# Patient Record
Sex: Male | Born: 1940 | Race: Black or African American | Hispanic: No | Marital: Married | Smoking: Former smoker
Health system: Southern US, Community
[De-identification: ages and names within clinical notes are randomized; demographics above are authoritative.]

## PROBLEM LIST (undated history)

## (undated) DIAGNOSIS — I10 Essential (primary) hypertension: Secondary | ICD-10-CM

## (undated) DIAGNOSIS — E119 Type 2 diabetes mellitus without complications: Secondary | ICD-10-CM

## (undated) DIAGNOSIS — F988 Other specified behavioral and emotional disorders with onset usually occurring in childhood and adolescence: Secondary | ICD-10-CM

## (undated) DIAGNOSIS — E785 Hyperlipidemia, unspecified: Secondary | ICD-10-CM

## (undated) DIAGNOSIS — H5440 Blindness, one eye, unspecified eye: Secondary | ICD-10-CM

## (undated) DIAGNOSIS — M1990 Unspecified osteoarthritis, unspecified site: Secondary | ICD-10-CM

## (undated) HISTORY — DX: Hyperlipidemia, unspecified: E78.5

## (undated) HISTORY — DX: Other specified behavioral and emotional disorders with onset usually occurring in childhood and adolescence: F98.8

## (undated) HISTORY — PX: EYE SURGERY: SHX253

## (undated) HISTORY — DX: Essential (primary) hypertension: I10

## (undated) HISTORY — PX: OTHER SURGICAL HISTORY: SHX169

## (undated) HISTORY — PX: TONSILLECTOMY: SUR1361

## (undated) HISTORY — DX: Type 2 diabetes mellitus without complications: E11.9

## (undated) HISTORY — DX: Blindness, one eye, unspecified eye: H54.40

---

## 2001-03-05 ENCOUNTER — Encounter: Payer: Self-pay | Admitting: Surgery

## 2001-03-10 ENCOUNTER — Ambulatory Visit (HOSPITAL_COMMUNITY): Admission: RE | Admit: 2001-03-10 | Discharge: 2001-03-10 | Payer: Self-pay | Admitting: Surgery

## 2003-02-01 ENCOUNTER — Encounter: Admission: RE | Admit: 2003-02-01 | Discharge: 2003-05-02 | Payer: Self-pay | Admitting: Family Medicine

## 2003-07-13 ENCOUNTER — Encounter (HOSPITAL_BASED_OUTPATIENT_CLINIC_OR_DEPARTMENT_OTHER): Admission: RE | Admit: 2003-07-13 | Discharge: 2003-10-11 | Payer: Self-pay | Admitting: Internal Medicine

## 2003-10-27 ENCOUNTER — Encounter (HOSPITAL_BASED_OUTPATIENT_CLINIC_OR_DEPARTMENT_OTHER): Admission: RE | Admit: 2003-10-27 | Discharge: 2003-11-25 | Payer: Self-pay | Admitting: Internal Medicine

## 2004-02-23 ENCOUNTER — Encounter (HOSPITAL_BASED_OUTPATIENT_CLINIC_OR_DEPARTMENT_OTHER): Admission: RE | Admit: 2004-02-23 | Discharge: 2004-03-01 | Payer: Self-pay | Admitting: Internal Medicine

## 2004-05-30 ENCOUNTER — Encounter (HOSPITAL_BASED_OUTPATIENT_CLINIC_OR_DEPARTMENT_OTHER): Admission: RE | Admit: 2004-05-30 | Discharge: 2004-06-08 | Payer: Self-pay | Admitting: Internal Medicine

## 2004-09-05 ENCOUNTER — Encounter (HOSPITAL_BASED_OUTPATIENT_CLINIC_OR_DEPARTMENT_OTHER): Admission: RE | Admit: 2004-09-05 | Discharge: 2004-09-28 | Payer: Self-pay | Admitting: Internal Medicine

## 2004-12-11 ENCOUNTER — Encounter (HOSPITAL_BASED_OUTPATIENT_CLINIC_OR_DEPARTMENT_OTHER): Admission: RE | Admit: 2004-12-11 | Discharge: 2004-12-24 | Payer: Self-pay | Admitting: Internal Medicine

## 2006-04-30 ENCOUNTER — Encounter: Admission: RE | Admit: 2006-04-30 | Discharge: 2006-05-23 | Payer: Self-pay | Admitting: Family Medicine

## 2012-01-07 DIAGNOSIS — H2510 Age-related nuclear cataract, unspecified eye: Secondary | ICD-10-CM | POA: Diagnosis not present

## 2012-01-07 DIAGNOSIS — E119 Type 2 diabetes mellitus without complications: Secondary | ICD-10-CM | POA: Diagnosis not present

## 2012-01-13 DIAGNOSIS — E119 Type 2 diabetes mellitus without complications: Secondary | ICD-10-CM | POA: Diagnosis not present

## 2012-01-13 DIAGNOSIS — E7800 Pure hypercholesterolemia, unspecified: Secondary | ICD-10-CM | POA: Diagnosis not present

## 2012-01-13 DIAGNOSIS — I10 Essential (primary) hypertension: Secondary | ICD-10-CM | POA: Diagnosis not present

## 2012-01-13 DIAGNOSIS — D179 Benign lipomatous neoplasm, unspecified: Secondary | ICD-10-CM | POA: Diagnosis not present

## 2012-03-30 DIAGNOSIS — H2510 Age-related nuclear cataract, unspecified eye: Secondary | ICD-10-CM | POA: Diagnosis not present

## 2012-06-01 DIAGNOSIS — H2510 Age-related nuclear cataract, unspecified eye: Secondary | ICD-10-CM | POA: Diagnosis not present

## 2012-07-13 DIAGNOSIS — E7800 Pure hypercholesterolemia, unspecified: Secondary | ICD-10-CM | POA: Diagnosis not present

## 2012-07-13 DIAGNOSIS — Z23 Encounter for immunization: Secondary | ICD-10-CM | POA: Diagnosis not present

## 2012-07-13 DIAGNOSIS — E119 Type 2 diabetes mellitus without complications: Secondary | ICD-10-CM | POA: Diagnosis not present

## 2012-07-13 DIAGNOSIS — I10 Essential (primary) hypertension: Secondary | ICD-10-CM | POA: Diagnosis not present

## 2012-09-11 DIAGNOSIS — Z1211 Encounter for screening for malignant neoplasm of colon: Secondary | ICD-10-CM | POA: Diagnosis not present

## 2012-09-11 DIAGNOSIS — K5730 Diverticulosis of large intestine without perforation or abscess without bleeding: Secondary | ICD-10-CM | POA: Diagnosis not present

## 2012-12-03 DIAGNOSIS — H2510 Age-related nuclear cataract, unspecified eye: Secondary | ICD-10-CM | POA: Diagnosis not present

## 2013-01-11 DIAGNOSIS — Z23 Encounter for immunization: Secondary | ICD-10-CM | POA: Diagnosis not present

## 2013-01-11 DIAGNOSIS — I10 Essential (primary) hypertension: Secondary | ICD-10-CM | POA: Diagnosis not present

## 2013-01-19 ENCOUNTER — Other Ambulatory Visit: Payer: Self-pay | Admitting: Family Medicine

## 2013-01-19 DIAGNOSIS — I10 Essential (primary) hypertension: Secondary | ICD-10-CM

## 2013-01-22 ENCOUNTER — Ambulatory Visit: Payer: Self-pay

## 2013-01-27 ENCOUNTER — Ambulatory Visit
Admission: RE | Admit: 2013-01-27 | Discharge: 2013-01-27 | Disposition: A | Discharge status: Home / Self Care | Payer: Medicare Other | Source: Ambulatory Visit | Attending: Family Medicine | Admitting: Family Medicine

## 2013-06-03 DIAGNOSIS — H2510 Age-related nuclear cataract, unspecified eye: Secondary | ICD-10-CM | POA: Diagnosis not present

## 2013-06-03 DIAGNOSIS — E119 Type 2 diabetes mellitus without complications: Secondary | ICD-10-CM | POA: Diagnosis not present

## 2013-07-12 DIAGNOSIS — Z23 Encounter for immunization: Secondary | ICD-10-CM | POA: Diagnosis not present

## 2013-07-12 DIAGNOSIS — I10 Essential (primary) hypertension: Secondary | ICD-10-CM | POA: Diagnosis not present

## 2013-07-12 DIAGNOSIS — E7800 Pure hypercholesterolemia, unspecified: Secondary | ICD-10-CM | POA: Diagnosis not present

## 2013-07-12 DIAGNOSIS — E119 Type 2 diabetes mellitus without complications: Secondary | ICD-10-CM | POA: Diagnosis not present

## 2013-08-05 DIAGNOSIS — M2010 Hallux valgus (acquired), unspecified foot: Secondary | ICD-10-CM | POA: Diagnosis not present

## 2013-08-05 DIAGNOSIS — E119 Type 2 diabetes mellitus without complications: Secondary | ICD-10-CM | POA: Diagnosis not present

## 2013-08-05 DIAGNOSIS — M2040 Other hammer toe(s) (acquired), unspecified foot: Secondary | ICD-10-CM | POA: Diagnosis not present

## 2013-08-05 DIAGNOSIS — L608 Other nail disorders: Secondary | ICD-10-CM | POA: Diagnosis not present

## 2013-10-08 DIAGNOSIS — L608 Other nail disorders: Secondary | ICD-10-CM | POA: Diagnosis not present

## 2013-10-08 DIAGNOSIS — E119 Type 2 diabetes mellitus without complications: Secondary | ICD-10-CM | POA: Diagnosis not present

## 2013-12-09 DIAGNOSIS — H2510 Age-related nuclear cataract, unspecified eye: Secondary | ICD-10-CM | POA: Diagnosis not present

## 2013-12-09 DIAGNOSIS — E119 Type 2 diabetes mellitus without complications: Secondary | ICD-10-CM | POA: Diagnosis not present

## 2013-12-10 DIAGNOSIS — L608 Other nail disorders: Secondary | ICD-10-CM | POA: Diagnosis not present

## 2013-12-10 DIAGNOSIS — E119 Type 2 diabetes mellitus without complications: Secondary | ICD-10-CM | POA: Diagnosis not present

## 2014-01-11 DIAGNOSIS — H5710 Ocular pain, unspecified eye: Secondary | ICD-10-CM | POA: Diagnosis not present

## 2014-01-11 DIAGNOSIS — T85398A Other mechanical complication of other ocular prosthetic devices, implants and grafts, initial encounter: Secondary | ICD-10-CM | POA: Diagnosis not present

## 2014-01-18 DIAGNOSIS — H18239 Secondary corneal edema, unspecified eye: Secondary | ICD-10-CM | POA: Diagnosis not present

## 2014-01-18 DIAGNOSIS — H2510 Age-related nuclear cataract, unspecified eye: Secondary | ICD-10-CM | POA: Diagnosis not present

## 2014-01-19 DIAGNOSIS — E7800 Pure hypercholesterolemia, unspecified: Secondary | ICD-10-CM | POA: Diagnosis not present

## 2014-01-19 DIAGNOSIS — E119 Type 2 diabetes mellitus without complications: Secondary | ICD-10-CM | POA: Diagnosis not present

## 2014-01-19 DIAGNOSIS — Z23 Encounter for immunization: Secondary | ICD-10-CM | POA: Diagnosis not present

## 2014-01-19 DIAGNOSIS — I10 Essential (primary) hypertension: Secondary | ICD-10-CM | POA: Diagnosis not present

## 2014-01-19 DIAGNOSIS — R079 Chest pain, unspecified: Secondary | ICD-10-CM | POA: Diagnosis not present

## 2014-02-11 DIAGNOSIS — E119 Type 2 diabetes mellitus without complications: Secondary | ICD-10-CM | POA: Diagnosis not present

## 2014-02-11 DIAGNOSIS — L608 Other nail disorders: Secondary | ICD-10-CM | POA: Diagnosis not present

## 2014-03-01 ENCOUNTER — Ambulatory Visit (INDEPENDENT_AMBULATORY_CARE_PROVIDER_SITE_OTHER): Payer: Medicare Other | Admitting: Interventional Cardiology

## 2014-03-01 ENCOUNTER — Encounter: Payer: Self-pay | Admitting: Interventional Cardiology

## 2014-03-01 VITALS — BP 132/74 | HR 73 | Ht 68.0 in | Wt 197.2 lb

## 2014-03-01 DIAGNOSIS — R079 Chest pain, unspecified: Secondary | ICD-10-CM

## 2014-03-01 DIAGNOSIS — E119 Type 2 diabetes mellitus without complications: Secondary | ICD-10-CM | POA: Insufficient documentation

## 2014-03-01 DIAGNOSIS — I10 Essential (primary) hypertension: Secondary | ICD-10-CM | POA: Diagnosis not present

## 2014-03-01 DIAGNOSIS — E785 Hyperlipidemia, unspecified: Secondary | ICD-10-CM | POA: Diagnosis not present

## 2014-03-01 NOTE — Patient Instructions (Signed)
Your physician has requested that you have a lexiscan myoview. For further information please visit [email or web address]. Please follow instruction sheet, as given.  Follow up will depend on results of stress test

## 2014-03-01 NOTE — Progress Notes (Signed)
Patient ID: [name], male   DOB: [birth year], 73 y.o.   MRN: [id number]    [address] [place], [place]  [address] Phone: [phone] Fax:  [phone]  Date:  03/01/2014   ID:  [name], DOB February 04, 1941, MRN [id number]  PCP:  No primary provider on file.   ASSESSMENT:  1. Chest pain, with atypical features, however one episode occurred while shoveling snow and lasted 5 minutes 2. 15 year history of diabetes mellitus 3. Hypertension 4. Hyperlipidemia  PLAN:  1. Pharmacologic nuclear myocardial perfusion study 2. Baby aspirin one per day   SUBJECTIVE: [name] is a 73 y.o. male who has a 15 year history of diabetes, history of hypertension and hyperlipidemia. There is a significant family history of premature coronary atherosclerosis with both his father 2 brothers and a sister dying of myocardial infarction. The patient is sedentary predominantly related to hip and knee arthritis to prevent much walking. He has had episodes of left chest discomfort that of poorly characterized. One episode occurred while shoveling snow that prevented him from completing the task. After 5 minutes the discomfort resolved. Since that time he has had twinges of this discomfort that lasts seconds and resolve. There no associated symptoms or radiation of the discomfort.   Wt Readings from Last 3 Encounters:  03/01/14 197 lb 3.2 oz (89.449 kg)     Past Medical History  Diagnosis Date  . Attention deficit disorder   . Diabetes mellitus without complication   . Hyperlipidemia   . Hypertension   . Blind left eye     Current Outpatient Prescriptions  Medication Sig Dispense Refill  . aspirin 81 MG tablet Take 81 mg by mouth daily.      Marland Kitchen lisinopril (PRINIVIL,ZESTRIL) 20 MG tablet Take 20 mg by mouth daily.      . metFORMIN (GLUCOPHAGE-XR) 500 MG 24 hr tablet Take 500 mg by mouth daily with breakfast.      . simvastatin (ZOCOR) 20 MG tablet Take 20 mg by mouth  daily.       No current facility-administered medications for this visit.   Past Medical History  Diagnosis Date  . Attention deficit disorder   . Diabetes mellitus without complication   . Hyperlipidemia   . Hypertension   . Blind left eye    Family History  Problem Relation Age of Onset  . Heart attack Father   . Heart attack Sister   . Heart attack Brother     Allergies:   No Known Allergies  Social History:  The patient  reports that he has quit smoking. He does not have any smokeless tobacco history on file. He reports that he does not drink alcohol or use illicit drugs.   ROS:  Please see the history of present illness.   Denies blood in stool , transient neurological symptoms, difficulty with ambulation do to hip and knee arthritis bilateral, transient neurological symptoms, syncope, palpitations, orthopnea, PND, and ankle edema.   All other systems reviewed and negative.   OBJECTIVE: VS:  BP 132/74  Pulse 73  Ht 5\' 8"  (1.727 m)  Wt 197 lb 3.2 oz (89.449 kg)  BMI 29.99 kg/m2 Well nourished, well developed, in no acute distress, elderly appearing older than stated age 77: normal Neck: JVD flat. Carotid bruit absent  Cardiac:  normal S1, S2; RRR; no murmur Lungs:  clear to auscultation bilaterally, no wheezing, rhonchi or rales Abd: soft, nontender, no hepatomegaly Ext:  Edema absent. Pulses 2+ bilateral Skin: warm and dry Neuro:  CNs 2-12 intact, no focal abnormalities noted  EKG:  Normal sinus rhythm normal EKG       Signed, Illene Labrador III, MD 03/01/2014 9:17 AM

## 2014-03-15 ENCOUNTER — Ambulatory Visit (HOSPITAL_COMMUNITY): Payer: Medicare Other | Attending: Cardiovascular Disease | Admitting: Radiology

## 2014-03-15 VITALS — BP 135/67 | Ht 68.0 in | Wt 190.0 lb

## 2014-03-15 DIAGNOSIS — R079 Chest pain, unspecified: Secondary | ICD-10-CM | POA: Diagnosis not present

## 2014-03-15 DIAGNOSIS — I10 Essential (primary) hypertension: Secondary | ICD-10-CM | POA: Insufficient documentation

## 2014-03-15 DIAGNOSIS — Z87891 Personal history of nicotine dependence: Secondary | ICD-10-CM | POA: Diagnosis not present

## 2014-03-15 DIAGNOSIS — E119 Type 2 diabetes mellitus without complications: Secondary | ICD-10-CM | POA: Diagnosis not present

## 2014-03-15 DIAGNOSIS — Z8249 Family history of ischemic heart disease and other diseases of the circulatory system: Secondary | ICD-10-CM | POA: Insufficient documentation

## 2014-03-15 MED ORDER — TECHNETIUM TC 99M SESTAMIBI GENERIC - CARDIOLITE
11.0000 | Freq: Once | INTRAVENOUS | Status: AC | PRN
  Administered 2014-03-15: 11 via INTRAVENOUS

## 2014-03-15 MED ORDER — TECHNETIUM TC 99M SESTAMIBI GENERIC - CARDIOLITE
33.0000 | Freq: Once | INTRAVENOUS | Status: AC | PRN
  Administered 2014-03-15: 33 via INTRAVENOUS

## 2014-03-15 MED ORDER — REGADENOSON 0.4 MG/5ML IV SOLN
0.4000 mg | Freq: Once | INTRAVENOUS | Status: AC
  Administered 2014-03-15: 0.4 mg via INTRAVENOUS

## 2014-03-15 NOTE — Progress Notes (Signed)
[facility] [address] [place], [place] [address] [phone]    Cardiology Nuclear Med Study  [name] is a 73 y.o. male     MRN : [id number]     DOB: 05/17/1941  Procedure Date: 03/15/2014  Nuclear Med Background Indication for Stress Test:  Evaluation for Ischemia History:  n/a Cardiac Risk Factors:Strong  Family History - CAD, History of Smoking, Hypertension, Lipids and NIDDM  Symptoms:  Chest Pain   Nuclear Pre-Procedure Caffeine/Decaff Intake:  None > 12 hrs NPO After: 6:00pm   Lungs:  clear O2 Sat: 97% on room air. IV 0.9% NS with Angio Cath:  22g  IV Site: R Hand x 1, tolerated well IV Started by:  Irven Baltimore, RN  Chest Size (in):  44 Cup Size: n/a  Height: 5\' 8"  (1.727 m)  Weight:  190 lb (86.183 kg)  BMI:  Body mass index is 28.9 kg/(m^2). Tech Comments:  Fasting CBG was 158 at 0600 today per patient. No medication (metformin) this am. Irven Baltimore, RN.    Nuclear Med Study 1 or 2 day study: 1 day  Stress Test Type:  Carlton Adam  Reading MD: N/A  Order Authorizing Provider:  Daneen Schick, III, MD  Resting Radionuclide: Technetium 55m Sestamibi  Resting Radionuclide Dose: 11.0 mCi   Stress Radionuclide:  Technetium 67m Sestamibi  Stress Radionuclide Dose: 33.0 mCi           Stress Protocol Rest HR: 75 Stress HR: 96  Rest BP: 135/67 Stress BP: 138/81  Exercise Time (min): n/a METS: n/a   Predicted Max HR: 148 bpm % Max HR: 64.86 bpm Rate Pressure Product: 13248   Dose of Adenosine (mg):  n/a Dose of Lexiscan: 0.4 mg  Dose of Atropine (mg): n/a Dose of Dobutamine: n/a mcg/kg/min (at max HR)  Stress Test Technologist: Perrin Maltese, EMT-P  Nuclear Technologist:  Charlton Amor, CNMT     Rest Procedure:  Myocardial perfusion imaging was performed at rest 45 minutes following the intravenous administration of Technetium 63m Sestamibi. Rest ECG: NSR - Normal EKG  Stress Procedure:  The patient received IV Lexiscan  0.4 mg over 15-seconds.  Technetium 4m Sestamibi injected at 30-seconds. This patient had sob and chest discomfort with the Lexiscan injection. Quantitative spect images were obtained after a 45 minute delay. Stress ECG: No significant change from baseline ECG  QPS Raw Data Images:  Mild diaphragmatic attenuation.  Normal left ventricular size. Stress Images:  There is decreased uptake in the inferior wall. Rest Images:  There is decreased uptake in the inferior wall. Subtraction (SDS):  There is a fixed inferior defect that is most consistent with diaphragmatic attenuation. Transient Ischemic Dilatation (Normal <1.22):  1.15 Lung/Heart Ratio (Normal <0.45):  0.37  Quantitative Gated Spect Images QGS EDV:  71 ml QGS ESV:  19 ml  Impression Exercise Capacity:  Lexiscan with no exercise. BP Response:  Normal blood pressure response. Clinical Symptoms:  Mild chest pain/dyspnea. ECG Impression:  No significant ECG changes with Lexiscan. Comparison with Prior Nuclear Study: No previous nuclear study performed  Overall Impression:  Low risk stress nuclear study without reversible ischemia. Mild bowel attenuation artifact inferiorly.  LV Ejection Fraction: 73%.  LV Wall Motion:  NL LV Function; NL Wall Motion  Pixie Casino, MD, [facility] Board Certified in Nuclear Cardiology Attending Cardiologist [place]

## 2014-03-16 DIAGNOSIS — M25469 Effusion, unspecified knee: Secondary | ICD-10-CM | POA: Diagnosis not present

## 2014-03-16 DIAGNOSIS — M1710 Unilateral primary osteoarthritis, unspecified knee: Secondary | ICD-10-CM | POA: Diagnosis not present

## 2014-03-16 DIAGNOSIS — M25569 Pain in unspecified knee: Secondary | ICD-10-CM | POA: Diagnosis not present

## 2014-03-17 ENCOUNTER — Telehealth: Payer: Self-pay

## 2014-03-17 NOTE — Telephone Encounter (Signed)
Message copied by Lamar Laundry on Thu Mar 17, 2014 10:21 AM ------      Message from: Daneen Schick      Created: Wed Mar 16, 2014  9:55 AM       Low risk study with normal pumping ability and no evidence of blockage. ------

## 2014-03-17 NOTE — Telephone Encounter (Signed)
pt wife aware of nuclear study results.Low risk study with normal pumping ability and no evidence of blockage.pt wife verbalized understanding.

## 2014-03-20 ENCOUNTER — Encounter (HOSPITAL_BASED_OUTPATIENT_CLINIC_OR_DEPARTMENT_OTHER): Payer: Self-pay | Admitting: Emergency Medicine

## 2014-03-20 ENCOUNTER — Emergency Department (HOSPITAL_BASED_OUTPATIENT_CLINIC_OR_DEPARTMENT_OTHER)
Admission: EM | Admit: 2014-03-20 | Discharge: 2014-03-20 | Disposition: A | Discharge status: Home / Self Care | Payer: Medicare Other | Attending: Emergency Medicine | Admitting: Emergency Medicine

## 2014-03-20 ENCOUNTER — Emergency Department (HOSPITAL_BASED_OUTPATIENT_CLINIC_OR_DEPARTMENT_OTHER): Payer: Medicare Other

## 2014-03-20 DIAGNOSIS — M6281 Muscle weakness (generalized): Secondary | ICD-10-CM | POA: Insufficient documentation

## 2014-03-20 DIAGNOSIS — E785 Hyperlipidemia, unspecified: Secondary | ICD-10-CM | POA: Diagnosis not present

## 2014-03-20 DIAGNOSIS — M1710 Unilateral primary osteoarthritis, unspecified knee: Secondary | ICD-10-CM | POA: Diagnosis not present

## 2014-03-20 DIAGNOSIS — E119 Type 2 diabetes mellitus without complications: Secondary | ICD-10-CM | POA: Insufficient documentation

## 2014-03-20 DIAGNOSIS — Z7982 Long term (current) use of aspirin: Secondary | ICD-10-CM | POA: Insufficient documentation

## 2014-03-20 DIAGNOSIS — Z87891 Personal history of nicotine dependence: Secondary | ICD-10-CM | POA: Insufficient documentation

## 2014-03-20 DIAGNOSIS — Z791 Long term (current) use of non-steroidal anti-inflammatories (NSAID): Secondary | ICD-10-CM | POA: Insufficient documentation

## 2014-03-20 DIAGNOSIS — Z8659 Personal history of other mental and behavioral disorders: Secondary | ICD-10-CM | POA: Insufficient documentation

## 2014-03-20 DIAGNOSIS — H5440 Blindness, one eye, unspecified eye: Secondary | ICD-10-CM | POA: Insufficient documentation

## 2014-03-20 DIAGNOSIS — Z79899 Other long term (current) drug therapy: Secondary | ICD-10-CM | POA: Diagnosis not present

## 2014-03-20 DIAGNOSIS — IMO0002 Reserved for concepts with insufficient information to code with codable children: Secondary | ICD-10-CM | POA: Diagnosis not present

## 2014-03-20 DIAGNOSIS — M1990 Unspecified osteoarthritis, unspecified site: Secondary | ICD-10-CM

## 2014-03-20 DIAGNOSIS — M109 Gout, unspecified: Secondary | ICD-10-CM | POA: Insufficient documentation

## 2014-03-20 DIAGNOSIS — I10 Essential (primary) hypertension: Secondary | ICD-10-CM | POA: Insufficient documentation

## 2014-03-20 LAB — COMPREHENSIVE METABOLIC PANEL
ALK PHOS: 90 U/L (ref 39–117)
ALT: 31 U/L (ref 0–53)
AST: 40 U/L — ABNORMAL HIGH (ref 0–37)
Albumin: 3.3 g/dL — ABNORMAL LOW (ref 3.5–5.2)
BUN: 22 mg/dL (ref 6–23)
CO2: 24 mEq/L (ref 19–32)
CREATININE: 1 mg/dL (ref 0.50–1.35)
Calcium: 9.7 mg/dL (ref 8.4–10.5)
Chloride: 94 mEq/L — ABNORMAL LOW (ref 96–112)
GFR calc non Af Amer: 73 mL/min — ABNORMAL LOW (ref >90)
GFR, EST AFRICAN AMERICAN: 85 mL/min — AB (ref >90)
GLUCOSE: 256 mg/dL — AB (ref 70–99)
Potassium: 4.6 mEq/L (ref 3.7–5.3)
Sodium: 135 mEq/L — ABNORMAL LOW (ref 137–147)
TOTAL PROTEIN: 9 g/dL — AB (ref 6.0–8.3)
Total Bilirubin: 0.4 mg/dL (ref 0.3–1.2)

## 2014-03-20 LAB — CBC WITH DIFFERENTIAL/PLATELET
BASOS ABS: 0 10*3/uL (ref 0.0–0.1)
Basophils Relative: 0 % (ref 0–1)
Eosinophils Absolute: 0 10*3/uL (ref 0.0–0.7)
Eosinophils Relative: 0 % (ref 0–5)
HCT: 40.4 % (ref 39.0–52.0)
Hemoglobin: 13.7 g/dL (ref 13.0–17.0)
Lymphocytes Relative: 17 % (ref 12–46)
Lymphs Abs: 1.5 10*3/uL (ref 0.7–4.0)
MCH: 30 pg (ref 26.0–34.0)
MCHC: 33.9 g/dL (ref 30.0–36.0)
MCV: 88.4 fL (ref 78.0–100.0)
Monocytes Absolute: 1.2 10*3/uL — ABNORMAL HIGH (ref 0.1–1.0)
Monocytes Relative: 13 % — ABNORMAL HIGH (ref 3–12)
NEUTROS ABS: 6.1 10*3/uL (ref 1.7–7.7)
Neutrophils Relative %: 69 % (ref 43–77)
Platelets: 615 10*3/uL — ABNORMAL HIGH (ref 150–400)
RBC: 4.57 MIL/uL (ref 4.22–5.81)
RDW: 12.9 % (ref 11.5–15.5)
WBC: 8.7 10*3/uL (ref 4.0–10.5)

## 2014-03-20 LAB — URINALYSIS, ROUTINE W REFLEX MICROSCOPIC
GLUCOSE, UA: 100 mg/dL — AB
HGB URINE DIPSTICK: NEGATIVE
KETONES UR: 15 mg/dL — AB
Nitrite: NEGATIVE
Protein, ur: 30 mg/dL — AB
Specific Gravity, Urine: 1.029 (ref 1.005–1.030)
Urobilinogen, UA: 1 mg/dL (ref 0.0–1.0)
pH: 5.5 (ref 5.0–8.0)

## 2014-03-20 LAB — URINE MICROSCOPIC-ADD ON

## 2014-03-20 LAB — I-STAT CG4 LACTIC ACID, ED: LACTIC ACID, VENOUS: 1.85 mmol/L (ref 0.5–2.2)

## 2014-03-20 LAB — URIC ACID: Uric Acid, Serum: 4.8 mg/dL (ref 4.0–7.8)

## 2014-03-20 MED ORDER — KETOROLAC TROMETHAMINE 30 MG/ML IJ SOLN
30.0000 mg | Freq: Once | INTRAMUSCULAR | Status: AC
  Administered 2014-03-20: 30 mg via INTRAVENOUS
  Filled 2014-03-20: qty 1

## 2014-03-20 MED ORDER — COLCHICINE 0.6 MG PO TABS
0.6000 mg | ORAL_TABLET | Freq: Once | ORAL | Status: AC
  Administered 2014-03-20: 0.6 mg via ORAL
  Filled 2014-03-20: qty 1

## 2014-03-20 MED ORDER — SODIUM CHLORIDE 0.9 % IV BOLUS (SEPSIS)
1000.0000 mL | Freq: Once | INTRAVENOUS | Status: AC
  Administered 2014-03-20: 1000 mL via INTRAVENOUS

## 2014-03-20 MED ORDER — OXYCODONE-ACETAMINOPHEN 5-325 MG PO TABS: 2.0000 | ORAL_TABLET | ORAL | Status: DC | PRN

## 2014-03-20 MED ORDER — FENTANYL CITRATE 0.05 MG/ML IJ SOLN
50.0000 ug | Freq: Once | INTRAMUSCULAR | Status: AC
  Administered 2014-03-20: 50 ug via INTRAVENOUS
  Filled 2014-03-20: qty 2

## 2014-03-20 MED ORDER — COLCHICINE 0.6 MG PO TABS: 0.6000 mg | ORAL_TABLET | Freq: Every day | ORAL | Status: DC

## 2014-03-20 NOTE — Discharge Instructions (Signed)
Arthritis, Nonspecific °Arthritis is inflammation of a joint. This usually means pain, redness, warmth or swelling are present. One or more joints may be involved. There are a number of types of arthritis. Your caregiver may not be able to tell what type of arthritis you have right away. °CAUSES  °The most common cause of arthritis is the wear and tear on the joint (osteoarthritis). This causes damage to the cartilage, which can break down over time. The knees, hips, back and neck are most often affected by this type of arthritis. °Other types of arthritis and common causes of joint pain include: °· Sprains and other injuries near the joint. Sometimes minor sprains and injuries cause pain and swelling that develop hours later. °· Rheumatoid arthritis. This affects hands, feet and knees. It usually affects both sides of your body at the same time. It is often associated with chronic ailments, fever, weight loss and general weakness. °· Crystal arthritis. Gout and pseudo gout can cause occasional acute severe pain, redness and swelling in the foot, ankle, or knee. °· Infectious arthritis. Bacteria can get into a joint through a break in overlying skin. This can cause infection of the joint. Bacteria and viruses can also spread through the blood and affect your joints. °· Drug, infectious and allergy reactions. Sometimes joints can become mildly painful and slightly swollen with these types of illnesses. °SYMPTOMS  °· Pain is the main symptom. °· Your joint or joints can also be red, swollen and warm or hot to the touch. °· You may have a fever with certain types of arthritis, or even feel overall ill. °· The joint with arthritis will hurt with movement. Stiffness is present with some types of arthritis. °DIAGNOSIS  °Your caregiver will suspect arthritis based on your description of your symptoms and on your exam. Testing may be needed to find the type of arthritis: °· Blood and sometimes urine tests. °· X-ray tests  and sometimes CT or MRI scans. °· Removal of fluid from the joint (arthrocentesis) is done to check for bacteria, crystals or other causes. Your caregiver (or a specialist) will numb the area over the joint with a local anesthetic, and use a needle to remove joint fluid for examination. This procedure is only minimally uncomfortable. °· Even with these tests, your caregiver may not be able to tell what kind of arthritis you have. Consultation with a specialist (rheumatologist) may be helpful. °TREATMENT  °Your caregiver will discuss with you treatment specific to your type of arthritis. If the specific type cannot be determined, then the following general recommendations may apply. °Treatment of severe joint pain includes: °· Rest. °· Elevation. °· Anti-inflammatory medication (for example, ibuprofen) may be prescribed. Avoiding activities that cause increased pain. °· Only take over-the-counter or prescription medicines for pain and discomfort as recommended by your caregiver. °· Cold packs over an inflamed joint may be used for 10 to 15 minutes every hour. Hot packs sometimes feel better, but do not use overnight. Do not use hot packs if you are diabetic without your caregiver's permission. °· A cortisone shot into arthritic joints may help reduce pain and swelling. °· Any acute arthritis that gets worse over the next 1 to 2 days needs to be looked at to be sure there is no joint infection. °Long-term arthritis treatment involves modifying activities and lifestyle to reduce joint stress jarring. This can include weight loss. Also, exercise is needed to nourish the joint cartilage and remove waste. This helps keep the muscles   around the joint strong. HOME CARE INSTRUCTIONS   Do not take aspirin to relieve pain if gout is suspected. This elevates uric acid levels.  Only take over-the-counter or prescription medicines for pain, discomfort or fever as directed by your caregiver.  Rest the joint as much as  possible.  If your joint is swollen, keep it elevated.  Use crutches if the painful joint is in your leg.  Drinking plenty of fluids may help for certain types of arthritis.  Follow your caregiver's dietary instructions.  Try low-impact exercise such as:  Swimming.  Water aerobics.  Biking.  Walking.  Morning stiffness is often relieved by a warm shower.  Put your joints through regular range-of-motion. SEEK MEDICAL CARE IF:   You do not feel better in 24 hours or are getting worse.  You have side effects to medications, or are not getting better with treatment. SEEK IMMEDIATE MEDICAL CARE IF:   You have a fever.  You develop severe joint pain, swelling or redness.  Many joints are involved and become painful and swollen.  There is severe back pain and/or leg weakness.  You have loss of bowel or bladder control. Document Released: 12/12/2004 Document Revised: 01/27/2012 Document Reviewed: 12/28/2008 [facility] Patient Information 2014 [place].  Gout Gout is when your joints become red, sore, and swell (inflammed). This is caused by the buildup of uric acid crystals in the joints. Uric acid is a chemical that is normally in the blood. If the level of uric acid gets too high in the blood, these crystals form in your joints and tissues. Over time, these crystals can form into masses near the joints and tissues. These masses can destroy bone and cause the bone to look misshapen (deformed). HOME CARE   Do not take aspirin for pain.  Only take medicine as told by your doctor.  Rest the joint as much as you can. When in bed, keep sheets and blankets off painful areas.  Keep the sore joints raised (elevated).  Put warm or cold packs on painful joints. Use of warm or cold packs depends on which works best for you.  Use crutches if the painful joint is in your leg.  Drink enough fluids to keep your pee (urine) clear or pale yellow. Limit alcohol, sugary drinks,  and drinks with fructose in them.  Follow your diet instructions. Pay careful attention to how much protein you eat. Include fruits, vegetables, whole grains, and fat-free or low-fat milk products in your daily diet. Talk to your doctor or dietician about the use of coffee, vitamin C, and cherries. These may help lower uric acid levels.  Keep a healthy body weight. GET HELP RIGHT AWAY IF:   You have watery poop (diarrhea), throw up (vomit), or have any side effects from medicines.  You do not feel better in 24 hours, or you are getting worse.  Your joint becomes suddenly more tender, and you have chills or a fever. MAKE SURE YOU:   Understand these instructions.  Will watch your condition.  Will get help right away if you are not doing well or get worse. Document Released: 08/13/2008 Document Revised: 03/01/2013 Document Reviewed: 02/12/2010 [facility] Patient Information 2014 [place].

## 2014-03-20 NOTE — ED Provider Notes (Signed)
CSN: [id number]     Arrival date & time 03/20/14  1854 History   First MD Initiated Contact with Patient 03/20/14 1917     This chart was scribed for [name], MD by Forrestine Him, ED Scribe. This patient was seen in room MH01/MH01 and the patient's care was started 7:23 PM.   Chief Complaint  Patient presents with  . Leg Pain  . Extremity Weakness   HPI  HPI Comments: [name] is a 73 y.o. male with a PMHx of Gout, arthritis, diabetes mellitus, HTN, and hyperlipidemia who presents to the Emergency Department complaining of ongoing, constant, severe R kneepain x 10 days that is progressively worsening. Pt also reports swelling to the knees bilaterally and dark discolored urine. He states this knee pain is exacerbated by certain movements, palpation, and weight bearing. States at this point, he is unable to ambulate secondary to pain. He has tried 6 OTC Aleve daily with no noticeable improvement. At this time he denies any fever, chills, numbness, or tingling. Pt states he was seen for the same complaint on 4/28. At time of visit, fluid was removed from the R knee and he was diagnosed with Gout. Pt states medications were not prescribed at discharge to manage symptoms. He has no other pertinent past medical history. No other concerns this visit.  Past Medical History  Diagnosis Date  . Attention deficit disorder   . Diabetes mellitus without complication   . Hyperlipidemia   . Hypertension   . Blind left eye    Past Surgical History  Procedure Laterality Date  . Lipoma removal, right upper back     Family History  Problem Relation Age of Onset  . Heart attack Father   . Heart attack Sister   . Heart attack Brother    History  Substance Use Topics  . Smoking status: Former Research scientist (life sciences)  . Smokeless tobacco: Not on file  . Alcohol Use: No    Review of Systems  Constitutional: Negative for fever and chills.  HENT: Negative for congestion.   Eyes: Negative for redness.   Respiratory: Negative for cough.   Musculoskeletal: Positive for arthralgias (R knee) and joint swelling (Knees bilaterally).  Skin: Negative for rash.  Neurological: Negative for dizziness and numbness.  Psychiatric/Behavioral: Negative for confusion.      Allergies  Review of patient's allergies indicates no known allergies.  Home Medications   Prior to Admission medications   Medication Sig Start Date End Date Taking? Authorizing Provider  naproxen sodium (ANAPROX) 220 MG tablet Take 220 mg by mouth 2 (two) times daily with a meal.   Yes Historical Provider, MD  aspirin 81 MG tablet Take 81 mg by mouth daily.    Historical Provider, MD  lisinopril (PRINIVIL,ZESTRIL) 20 MG tablet Take 20 mg by mouth daily.    Historical Provider, MD  metFORMIN (GLUCOPHAGE-XR) 500 MG 24 hr tablet Take 500 mg by mouth daily with breakfast.    Historical Provider, MD  simvastatin (ZOCOR) 20 MG tablet Take 20 mg by mouth daily.    Historical Provider, MD   Triage Vitals: BP 165/77  Pulse 107  Temp(Src) 98 F (36.7 C)  Resp 16  Ht 5\' 8"  (1.727 m)  Wt 190 lb (86.183 kg)  BMI 28.90 kg/m2  SpO2 99%   Physical Exam  Nursing note and vitals reviewed. Constitutional: He is oriented to person, place, and time. He appears well-developed and well-nourished. No distress.  HENT:  Head: Normocephalic and atraumatic.  Eyes: Pupils are equal, round, and reactive to light.  Neck: Normal range of motion.  Cardiovascular: Normal rate and intact distal pulses.   Pulmonary/Chest: No respiratory distress.  Abdominal: Normal appearance. He exhibits no distension.  Musculoskeletal:       Right knee: He exhibits decreased range of motion and swelling. He exhibits no effusion and no erythema. Tenderness found.       Left knee: He exhibits decreased range of motion. Tenderness found.  Neurological: He is alert and oriented to person, place, and time. No cranial nerve deficit.  Skin: Skin is warm and dry. No rash  noted.  Psychiatric: He has a normal mood and affect. His behavior is normal.    ED Course  Procedures (including critical care time) Medications  sodium chloride 0.9 % bolus 1,000 mL (1,000 mLs Intravenous New Bag/Given 03/20/14 2051)  fentaNYL (SUBLIMAZE) injection 50 mcg (not administered)  colchicine tablet 0.6 mg (not administered)  ketorolac (TORADOL) 30 MG/ML injection 30 mg (30 mg Intravenous Given 03/20/14 2052)      DIAGNOSTIC STUDIES: Oxygen Saturation is 99% on RA, Normal by my interpretation.    COORDINATION OF CARE: 7:31 PM- Will order Comp. Metabolic panel, CBC with differential, and Uric acid. Discussed treatment plan with pt at bedside and pt agreed to plan.     Labs Review Labs Reviewed  COMPREHENSIVE METABOLIC PANEL - Abnormal; Notable for the following:    Sodium 135 (*)    Chloride 94 (*)    Glucose, Bld 256 (*)    Total Protein 9.0 (*)    Albumin 3.3 (*)    AST 40 (*)    GFR calc non Af Amer 73 (*)    GFR calc Af Amer 85 (*)    All other components within normal limits  CBC WITH DIFFERENTIAL - Abnormal; Notable for the following:    Platelets 615 (*)    Monocytes Relative 13 (*)    Monocytes Absolute 1.2 (*)    All other components within normal limits  URINALYSIS, ROUTINE W REFLEX MICROSCOPIC - Abnormal; Notable for the following:    Color, Urine AMBER (*)    APPearance CLOUDY (*)    Glucose, UA 100 (*)    Bilirubin Urine SMALL (*)    Ketones, ur 15 (*)    Protein, ur 30 (*)    Leukocytes, UA SMALL (*)    All other components within normal limits  URINE MICROSCOPIC-ADD ON - Abnormal; Notable for the following:    Squamous Epithelial / LPF FEW (*)    All other components within normal limits  URIC ACID  LACTIC ACID, PLASMA  I-STAT CG4 LACTIC ACID, ED    Imaging Review Dg Knee 2 Views Left  03/20/2014   CLINICAL DATA:  Bilateral knee pain  EXAM: LEFT KNEE - 1-2 VIEW  COMPARISON:  None.  FINDINGS: There is no acute fracture or dislocation. There  is no significant joint effusion. There are mild tricompartmental osteoarthritic changes primarily mass manifested by marginal osteophytosis. 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  IMPRESSION: Mild tricompartmental osteoarthritis most significant in the patellofemoral compartment.   Electronically Signed   By: Kathreen Devoid   On: 03/20/2014 20:57   Dg Knee 2 Views Right  03/20/2014   CLINICAL DATA:  Bilateral knee pain  EXAM: RIGHT KNEE - 1-2 VIEW  COMPARISON:  None.  FINDINGS: There is no acute fracture or dislocation. There are tricompartmental osteoarthritic changes of the right knee most severe in the lateral femorotibial  compartment. There is a moderate joint effusion. 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  IMPRESSION: Tricompartmental osteoarthritis of the right knee.   Electronically Signed   By: Kathreen Devoid   On: 03/20/2014 20:56      MDM   Final diagnoses:  Gout  Arthritis      I personally performed the services described in this documentation, which was scribed in my presence. The recorded information has been reviewed and is accurate.    [name], MD 03/20/14 [phone]

## 2014-03-20 NOTE — ED Notes (Signed)
Pt started to have dark urine and right knee pain since April 23.  Pt went to see ortho for right knee pain, drained fluid and gave cortisone shot.  Pt has continued to have leg pain and dark urine.

## 2014-03-24 ENCOUNTER — Encounter (HOSPITAL_COMMUNITY): Payer: Self-pay | Admitting: Emergency Medicine

## 2014-03-24 ENCOUNTER — Emergency Department (HOSPITAL_COMMUNITY)
Admission: EM | Admit: 2014-03-24 | Discharge: 2014-03-24 | Disposition: A | Discharge status: Home / Self Care | Payer: Medicare Other | Attending: Emergency Medicine | Admitting: Emergency Medicine

## 2014-03-24 DIAGNOSIS — M7120 Synovial cyst of popliteal space [Baker], unspecified knee: Secondary | ICD-10-CM | POA: Insufficient documentation

## 2014-03-24 DIAGNOSIS — Z79899 Other long term (current) drug therapy: Secondary | ICD-10-CM | POA: Insufficient documentation

## 2014-03-24 DIAGNOSIS — E119 Type 2 diabetes mellitus without complications: Secondary | ICD-10-CM | POA: Insufficient documentation

## 2014-03-24 DIAGNOSIS — M109 Gout, unspecified: Secondary | ICD-10-CM | POA: Diagnosis not present

## 2014-03-24 DIAGNOSIS — Z8669 Personal history of other diseases of the nervous system and sense organs: Secondary | ICD-10-CM | POA: Insufficient documentation

## 2014-03-24 DIAGNOSIS — E785 Hyperlipidemia, unspecified: Secondary | ICD-10-CM | POA: Insufficient documentation

## 2014-03-24 DIAGNOSIS — Z7982 Long term (current) use of aspirin: Secondary | ICD-10-CM | POA: Insufficient documentation

## 2014-03-24 DIAGNOSIS — Z87891 Personal history of nicotine dependence: Secondary | ICD-10-CM | POA: Diagnosis not present

## 2014-03-24 DIAGNOSIS — R630 Anorexia: Secondary | ICD-10-CM | POA: Insufficient documentation

## 2014-03-24 DIAGNOSIS — Z8659 Personal history of other mental and behavioral disorders: Secondary | ICD-10-CM | POA: Insufficient documentation

## 2014-03-24 DIAGNOSIS — M7989 Other specified soft tissue disorders: Secondary | ICD-10-CM

## 2014-03-24 DIAGNOSIS — M1710 Unilateral primary osteoarthritis, unspecified knee: Secondary | ICD-10-CM | POA: Diagnosis not present

## 2014-03-24 DIAGNOSIS — M79609 Pain in unspecified limb: Secondary | ICD-10-CM | POA: Diagnosis not present

## 2014-03-24 DIAGNOSIS — I10 Essential (primary) hypertension: Secondary | ICD-10-CM | POA: Insufficient documentation

## 2014-03-24 DIAGNOSIS — M10061 Idiopathic gout, right knee: Secondary | ICD-10-CM

## 2014-03-24 DIAGNOSIS — M1711 Unilateral primary osteoarthritis, right knee: Secondary | ICD-10-CM

## 2014-03-24 DIAGNOSIS — M25569 Pain in unspecified knee: Secondary | ICD-10-CM | POA: Diagnosis not present

## 2014-03-24 MED ORDER — COLCHICINE 0.6 MG PO TABS: ORAL_TABLET | ORAL | Status: DC

## 2014-03-24 MED ORDER — PREDNISONE 20 MG PO TABS: 20.0000 mg | ORAL_TABLET | Freq: Two times a day (BID) | ORAL | Status: DC

## 2014-03-24 NOTE — ED Notes (Signed)
Pt being sent by PCP, [facility], to rule out DVT.  Pt c/o BLE pain and swelling.

## 2014-03-24 NOTE — ED Notes (Signed)
Pt having increased swelling in bilat lower extremities and pain ontop of gout that pt was recently seen and treated for. Pt referred here from [facility] to rule out DVT. Pt unable to bear weight or ambulate.

## 2014-03-24 NOTE — ED Provider Notes (Signed)
CSN: [id number]     Arrival date & time 03/24/14  1426 History   First MD Initiated Contact with Patient 03/24/14 1511     Chief Complaint  Patient presents with  . Leg Swelling  . Leg Pain     (Consider location/radiation/quality/duration/timing/severity/associated sxs/prior Treatment) HPI  [name] is a 73 y.o. male who has been having right knee pain, for 2 and half weeks, he then began having discomfort in his left leg, with swelling several days ago. He went to his PCP, today, who sent him here to be evaluated for left leg DVT. He had his right knee aspirated about 2 weeks ago, and an installation of steroid medication, by his orthopedist. Fluid analysis from that was reported to have gout crystals. He was seen in the ED 4 days ago, and started on colchicine 1 tablet each day, and Percocet, for pain. Additionally, he is using ibuprofen 2-3 times a day to help with pain. The swelling in his right knee is improved, but the pain is progressive, and he is unable to walk, secondary to right knee pain. He feels like he is favoring his right knee and this may have caused his left leg  problem. He denies fever, chills, nausea, vomiting, cough, shortness of breath, chest pain, weakness, or dizziness. He has been somewhat anorexic. He reports having normal bowel movements. He has a history of intermittent right knee pain, but has not been treated for gout in the past. There are no other known modifying factors.   Past Medical History  Diagnosis Date  . Attention deficit disorder   . Diabetes mellitus without complication   . Hyperlipidemia   . Hypertension   . Blind left eye    Past Surgical History  Procedure Laterality Date  . Lipoma removal, right upper back     Family History  Problem Relation Age of Onset  . Heart attack Father   . Heart attack Sister   . Heart attack Brother    History  Substance Use Topics  . Smoking status: Former Research scientist (life sciences)  . Smokeless tobacco: Not on  file  . Alcohol Use: No    Review of Systems  All other systems reviewed and are negative.     Allergies  Review of patient's allergies indicates no known allergies.  Home Medications   Prior to Admission medications   Medication Sig Start Date End Date Taking? Authorizing Provider  aspirin 81 MG tablet Take 81 mg by mouth daily.   Yes Historical Provider, MD  colchicine 0.6 MG tablet Take 1 tablet (0.6 mg total) by mouth daily. 03/20/14  Yes [name], MD  ibuprofen (ADVIL,MOTRIN) 200 MG tablet Take 400-800 mg by mouth 3 (three) times daily as needed for mild pain.   Yes Historical Provider, MD  lisinopril (PRINIVIL,ZESTRIL) 20 MG tablet Take 20 mg by mouth daily.   Yes Historical Provider, MD  metFORMIN (GLUCOPHAGE-XR) 500 MG 24 hr tablet Take 2,000 mg by mouth at bedtime.    Yes Historical Provider, MD  oxyCODONE-acetaminophen (PERCOCET/ROXICET) 5-325 MG per tablet Take 2 tablets by mouth every 4 (four) hours as needed for severe pain. 03/20/14  Yes [name], MD  simvastatin (ZOCOR) 20 MG tablet Take 20 mg by mouth daily.   Yes Historical Provider, MD   BP 134/77  Pulse 96  Temp(Src) 99.8 F (37.7 C) (Oral)  Resp 20  SpO2 99% Physical Exam  Nursing note and vitals reviewed. Constitutional: He is oriented to person,  place, and time. He appears well-developed.  Elderly, frail  HENT:  Head: Normocephalic and atraumatic.  Right Ear: External ear normal.  Left Ear: External ear normal.  Eyes: Conjunctivae and EOM are normal. Pupils are equal, round, and reactive to light.  Neck: Normal range of motion and phonation normal. Neck supple.  Cardiovascular: Normal rate, regular rhythm, normal heart sounds and intact distal pulses.   Pulmonary/Chest: Effort normal and breath sounds normal. He exhibits no bony tenderness.  Abdominal: Soft. There is no tenderness.  Musculoskeletal: Normal range of motion.  Right knee, tender and swollen, he prefers to hold it flexed,  secondary to pain on extension. Left leg has mild swelling of calf and foot, with moderate, popliteal and calf tenderness.  Neurological: He is alert and oriented to person, place, and time. No cranial nerve deficit or sensory deficit. He exhibits normal muscle tone. Coordination normal.  Skin: Skin is warm, dry and intact.  Psychiatric: He has a normal mood and affect. His behavior is normal. Judgment and thought content normal.    ED Course  Procedures (including critical care time)  Medications - No data to display  Patient Vitals for the past 24 hrs:  BP Temp Temp src Pulse Resp SpO2  03/24/14 1448 134/77 mmHg 99.8 F (37.7 C) Oral 96 20 99 %   Per vascular technician- no DVT, left leg; there is presence of Baker's cyst, that has ruptured, in left posterior knee.  6:10 PM Reevaluation with update and discussion. After initial assessment and treatment, an updated evaluation reveals no change in status. Findings discussed with patient and family members, all questions answered. Richarda Blade       MDM   Diagnoses that have been ruled out:  None  Diagnoses that are still under consideration:  None  Final diagnoses:  Arthritis of right knee  Idiopathic gout of right knee  Baker's cyst of knee    Ongoing right knee pain, secondary to gout, acute attack. Left knee pain and swelling is secondary to a Baker's cyst, with rupture. There's no indication for further evaluation, and/or treatment in the emergency department.   Nursing Notes Reviewed/ Care Coordinated Applicable Imaging Reviewed Interpretation of Laboratory Data incorporated into ED treatment  The patient appears reasonably screened and/or stabilized for discharge and I doubt any other medical condition or other [facility] requiring further screening, evaluation, or treatment in the ED at this time prior to discharge.  Plan: Home Medications- colchicine, multiple doses, daily and prednisone; Home Treatments- Ace wrap for  comfort; return here if the recommended treatment, does not improve the symptoms; Recommended follow up- orthopedic followup in one week     Richarda Blade, MD 03/24/14 [phone]

## 2014-03-24 NOTE — Progress Notes (Signed)
VASCULAR LAB PRELIMINARY  PRELIMINARY  PRELIMINARY  PRELIMINARY  Left lower extremity venous duplex completed.    Preliminary report:  No evidence of left lower extremity deep vein of superficial vein thrombosis. Positive for fluid coursing form the popliteal fossa 7.38 cm into the calf consistent with a ruptured Baker's cyst.  Dareen Piano, RVS 03/24/2014, 5:38 PM

## 2014-03-24 NOTE — Discharge Instructions (Signed)
Use the Ace wraps, as needed, for comfort. Stop the ibuprofen, while taking the prednisone. A purine restricted diet can decrease the effect of the gout. Followup with your orthopedist, in one week for a checkup.   Baker Cyst A Baker cyst is a sac-like structure that forms in the back of the knee. It is filled with the same fluid that is located in your knee. This fluid lubricates the bones and cartilage of the knee and allows them to move over each other more easily. CAUSES  When the knee becomes injured or inflamed, increased fluid forms in the knee. When this happens, the joint lining is pushed out behind the knee and forms the Baker cyst. This cyst may also be caused by inflammation from arthritic conditions and infections. SIGNS AND SYMPTOMS  A Baker cyst usually has no symptoms. When the cyst is substantially enlarged:  You may feel pressure behind the knee, stiffness in the knee, or a mass in the area behind the knee.  You may develop pain, redness, and swelling in the calf. This can suggest a blood clot and requires evaluation by your health care provider. DIAGNOSIS  A Baker cyst is most often found during an ultrasound exam. This exam may have been performed for other reasons, and the cyst was found incidentally. Sometimes an MRI is used. This picks up other problems within a joint that an ultrasound exam may not. If the Baker cyst developed immediately after an injury, X-ray exams may be used to diagnose the cyst. TREATMENT  The treatment depends on the cause of the cyst. Anti-inflammatory medicines and rest often will be prescribed. If the cyst is caused by a bacterial infection, antibiotic medicines may be prescribed.  HOME CARE INSTRUCTIONS   If the cyst was caused by an injury, for the first 24 hours, keep the injured leg elevated on 2 pillows while lying down.  For the first 24 hours while you are awake, apply ice to the injured area:  Put ice in a plastic bag.  Place a  towel between your skin and the bag.  Leave the ice on for 20 minutes, 2 3 times a day.  Only take over-the-counter or prescription medicines for pain, discomfort, or fever as directed by your health care provider.  Only take antibiotic medicine as directed. Make sure to finish it even if you start to feel better. MAKE SURE YOU:   Understand these instructions.  Will watch your condition.  Will get help right away if you are not doing well or get worse. Document Released: 11/04/2005 Document Revised: 08/25/2013 Document Reviewed: 06/16/2013 [facility] Patient Information 2014 [place].  Gout Gout is an inflammatory arthritis caused by a buildup of uric acid crystals in the joints. Uric acid is a chemical that is normally present in the blood. When the level of uric acid in the blood is too high it can form crystals that deposit in your joints and tissues. This causes joint redness, soreness, and swelling (inflammation). Repeat attacks are common. Over time, uric acid crystals can form into masses (tophi) near a joint, destroying bone and causing disfigurement. Gout is treatable and often preventable. CAUSES  The disease begins with elevated levels of uric acid in the blood. Uric acid is produced by your body when it breaks down a naturally found substance called purines. Certain foods you eat, such as meats and fish, contain high amounts of purines. Causes of an elevated uric acid level include:  Being passed down from parent  to child (heredity).  Diseases that cause increased uric acid production (such as obesity, psoriasis, and certain cancers).  Excessive alcohol use.  Diet, especially diets rich in meat and seafood.  Medicines, including certain cancer-fighting medicines (chemotherapy), water pills (diuretics), and aspirin.  Chronic kidney disease. The kidneys are no longer able to remove uric acid well.  Problems with metabolism. Conditions strongly associated with gout  include:  Obesity.  High blood pressure.  High cholesterol.  Diabetes. Not everyone with elevated uric acid levels gets gout. It is not understood why some people get gout and others do not. Surgery, joint injury, and eating too much of certain foods are some of the factors that can lead to gout attacks. SYMPTOMS   An attack of gout comes on quickly. It causes intense pain with redness, swelling, and warmth in a joint.  Fever can occur.  Often, only one joint is involved. Certain joints are more commonly involved:  Base of the big toe.  Knee.  Ankle.  Wrist.  Finger. Without treatment, an attack usually goes away in a few days to weeks. Between attacks, you usually will not have symptoms, which is different from many other forms of arthritis. DIAGNOSIS  Your caregiver will suspect gout based on your symptoms and exam. In some cases, tests may be recommended. The tests may include:  Blood tests.  Urine tests.  X-rays.  Joint fluid exam. This exam requires a needle to remove fluid from the joint (arthrocentesis). Using a microscope, gout is confirmed when uric acid crystals are seen in the joint fluid. TREATMENT  There are two phases to gout treatment: treating the sudden onset (acute) attack and preventing attacks (prophylaxis).  Treatment of an Acute Attack.  Medicines are used. These include anti-inflammatory medicines or steroid medicines.  An injection of steroid medicine into the affected joint is sometimes necessary.  The painful joint is rested. Movement can worsen the arthritis.  You may use warm or cold treatments on painful joints, depending which works best for you.  Treatment to Prevent Attacks.  If you suffer from frequent gout attacks, your caregiver may advise preventive medicine. These medicines are started after the acute attack subsides. These medicines either help your kidneys eliminate uric acid from your body or decrease your uric acid  production. You may need to stay on these medicines for a very long time.  The early phase of treatment with preventive medicine can be associated with an increase in acute gout attacks. For this reason, during the first few months of treatment, your caregiver may also advise you to take medicines usually used for acute gout treatment. Be sure you understand your caregiver's directions. Your caregiver may make several adjustments to your medicine dose before these medicines are effective.  Discuss dietary treatment with your caregiver or dietitian. Alcohol and drinks high in sugar and fructose and foods such as meat, poultry, and seafood can increase uric acid levels. Your caregiver or dietician can advise you on drinks and foods that should be limited. HOME CARE INSTRUCTIONS   Do not take aspirin to relieve pain. This raises uric acid levels.  Only take over-the-counter or prescription medicines for pain, discomfort, or fever as directed by your caregiver.  Rest the joint as much as possible. When in bed, keep sheets and blankets off painful areas.  Keep the affected joint raised (elevated).  Apply warm or cold treatments to painful joints. Use of warm or cold treatments depends on which works best for you.  Use crutches if the painful joint is in your leg.  Drink enough fluids to keep your urine clear or pale yellow. This helps your body get rid of uric acid. Limit alcohol, sugary drinks, and fructose drinks.  Follow your dietary instructions. Pay careful attention to the amount of protein you eat. Your daily diet should emphasize fruits, vegetables, whole grains, and fat-free or low-fat milk products. Discuss the use of coffee, vitamin C, and cherries with your caregiver or dietician. These may be helpful in lowering uric acid levels.  Maintain a healthy body weight. SEEK MEDICAL CARE IF:   You develop diarrhea, vomiting, or any side effects from medicines.  You do not feel better in  24 hours, or you are getting worse. SEEK IMMEDIATE MEDICAL CARE IF:   Your joint becomes suddenly more tender, and you have chills or a fever. MAKE SURE YOU:   Understand these instructions.  Will watch your condition.  Will get help right away if you are not doing well or get worse. Document Released: 11/01/2000 Document Revised: 03/01/2013 Document Reviewed: 06/17/2012 [facility] Patient Information 2014 [place].   Purine Restricted Diet A low-purine diet consists of foods that reduce uric acid made in your body. INDICATIONS FOR USE  Your caregiver may ask you to follow a low-purine diet to reduce gout flairs.  GUIDELINES  Avoid high-purine foods, including all alcohol, yeast extracts taken as supplements, and sauces made from meats (like gravy). Do not eat high-purine meats, including anchovies, sardines, herring, mussels, tuna, codfish, scallops, trout, haddock, bacon, organ meats, tripe, goose, wild game, and sweetbreads.  Grains  Allowed/Recommended: All, except those listed to consume in moderation.  Consume in Moderation: Oatmeal ( cup uncooked daily), wheat bran or germ ( cup daily), and whole grains. Vegetables  Allowed/Recommended: All, except those listed to consume in moderation.  Consume in Moderation: Asparagus, cauliflower, spinach, mushrooms, and green peas ( cup daily). Fruit  Allowed/Recommended: All.  Consume in Moderation: None. Meat and Meat Substitutes  Allowed/Recommended: Eggs, nuts, and peanut butter.  Consume in Moderation: Limit to 4 to 6 oz daily. Avoid high-purine meats. Lentils, peas, and dried beans (1 cup daily). Milk  Allowed/Recommended: All. Choose low-fat or skim when possible.  Consume in Moderation: None. Fats and Oils  Allowed/Recommended: All.  Consume in Moderation: None. Beverages  Allowed/Recommended: All, except those listed to avoid.  Avoid: All alcohol. Condiments/Miscellaneous  Allowed/Recommended: All,  except those listed to consume in moderation.  Consume in Moderation: Bouillon and meat-based broths and soups. Document Released: 03/01/2011 Document Revised: 01/27/2012 Document Reviewed: 03/01/2011 [facility] Patient Information 2014 [place].

## 2014-03-24 NOTE — Progress Notes (Signed)
  CARE MANAGEMENT ED NOTE 03/24/2014  Patient:  [name], [name]   Account Number:  [id number]  Date Initiated:  03/24/2014  Documentation initiated by:  Livia Snellen  Subjective/Objective Assessment:   Patient presents to Ed sent by pcp for bilateral leg pain and swelling     Subjective/Objective Assessment Detail:     Action/Plan:   Action/Plan Detail:   Anticipated DC Date:       Status Recommendation to Physician:   Result of Recommendation:    Other ED [place]  Other  PCP issues    Choice offered to / List presented to:            Status of service:  Completed, signed off  ED Comments:   ED Comments Detail:  EDCM spoke to patient at bedside. Patient lives with his wife Hoyle Sauer.  Patient confirms his pcp is Dr. Kathyrn Lass at [facility].  Patient reports he does not have any home health services at this time.  Patient has a walker, cane wheelchair and crutches at home, but they are not his own, he is borrowing them.  EDCM provided patient with list of home health agencies in [facility].  EDCM explained with home health, patient may receive a visiting RN, PT, OT aide and social worker if needed.  Patient and his wife thankful for resources.  No further EDCM needs at this time.

## 2014-03-28 ENCOUNTER — Other Ambulatory Visit (HOSPITAL_COMMUNITY): Payer: Self-pay | Admitting: Orthopedic Surgery

## 2014-03-28 DIAGNOSIS — M25469 Effusion, unspecified knee: Secondary | ICD-10-CM | POA: Diagnosis not present

## 2014-03-28 DIAGNOSIS — M1710 Unilateral primary osteoarthritis, unspecified knee: Secondary | ICD-10-CM | POA: Diagnosis not present

## 2014-03-28 DIAGNOSIS — M25569 Pain in unspecified knee: Secondary | ICD-10-CM | POA: Diagnosis not present

## 2014-03-28 NOTE — Progress Notes (Signed)
According to pt, he was instructed to eat a light breakfast up until 7:00 AM the morning of surgery. Pt made aware to stop Aspirin and  NSAIDs

## 2014-03-29 ENCOUNTER — Inpatient Hospital Stay (HOSPITAL_COMMUNITY)
Admission: RE | Admit: 2014-03-29 | Discharge: 2014-04-05 | DRG: 549 | Disposition: A | Discharge status: Skilled Nursing Facility | Payer: Medicare Other | Source: Ambulatory Visit | Attending: Orthopedic Surgery | Admitting: Orthopedic Surgery

## 2014-03-29 ENCOUNTER — Encounter (HOSPITAL_COMMUNITY): Payer: Medicare Other | Admitting: Anesthesiology

## 2014-03-29 ENCOUNTER — Inpatient Hospital Stay (HOSPITAL_COMMUNITY): Payer: Medicare Other

## 2014-03-29 ENCOUNTER — Encounter (HOSPITAL_COMMUNITY)
Admission: RE | Disposition: A | Discharge status: Skilled Nursing Facility | Payer: Self-pay | Source: Ambulatory Visit | Attending: Orthopedic Surgery

## 2014-03-29 ENCOUNTER — Encounter (HOSPITAL_COMMUNITY): Payer: Self-pay | Admitting: *Deleted

## 2014-03-29 ENCOUNTER — Inpatient Hospital Stay (HOSPITAL_COMMUNITY): Payer: Medicare Other | Admitting: Anesthesiology

## 2014-03-29 ENCOUNTER — Encounter (HOSPITAL_COMMUNITY): Payer: Self-pay | Admitting: Pharmacy Technician

## 2014-03-29 DIAGNOSIS — M109 Gout, unspecified: Secondary | ICD-10-CM | POA: Diagnosis present

## 2014-03-29 DIAGNOSIS — M659 Unspecified synovitis and tenosynovitis, unspecified site: Secondary | ICD-10-CM | POA: Diagnosis present

## 2014-03-29 DIAGNOSIS — H5440 Blindness, one eye, unspecified eye: Secondary | ICD-10-CM | POA: Diagnosis not present

## 2014-03-29 DIAGNOSIS — Z79899 Other long term (current) drug therapy: Secondary | ICD-10-CM | POA: Diagnosis not present

## 2014-03-29 DIAGNOSIS — R259 Unspecified abnormal involuntary movements: Secondary | ICD-10-CM | POA: Diagnosis present

## 2014-03-29 DIAGNOSIS — M12269 Villonodular synovitis (pigmented), unspecified knee: Secondary | ICD-10-CM | POA: Diagnosis not present

## 2014-03-29 DIAGNOSIS — S8990XA Unspecified injury of unspecified lower leg, initial encounter: Secondary | ICD-10-CM | POA: Diagnosis not present

## 2014-03-29 DIAGNOSIS — Z7982 Long term (current) use of aspirin: Secondary | ICD-10-CM | POA: Diagnosis not present

## 2014-03-29 DIAGNOSIS — I10 Essential (primary) hypertension: Secondary | ICD-10-CM | POA: Diagnosis not present

## 2014-03-29 DIAGNOSIS — M2240 Chondromalacia patellae, unspecified knee: Secondary | ICD-10-CM | POA: Diagnosis not present

## 2014-03-29 DIAGNOSIS — M25469 Effusion, unspecified knee: Secondary | ICD-10-CM | POA: Diagnosis present

## 2014-03-29 DIAGNOSIS — Z87891 Personal history of nicotine dependence: Secondary | ICD-10-CM | POA: Diagnosis not present

## 2014-03-29 DIAGNOSIS — I82409 Acute embolism and thrombosis of unspecified deep veins of unspecified lower extremity: Secondary | ICD-10-CM | POA: Diagnosis not present

## 2014-03-29 DIAGNOSIS — E119 Type 2 diabetes mellitus without complications: Secondary | ICD-10-CM | POA: Diagnosis not present

## 2014-03-29 DIAGNOSIS — M25569 Pain in unspecified knee: Secondary | ICD-10-CM | POA: Diagnosis not present

## 2014-03-29 DIAGNOSIS — M9420 Chondromalacia, unspecified site: Secondary | ICD-10-CM | POA: Diagnosis present

## 2014-03-29 DIAGNOSIS — IMO0002 Reserved for concepts with insufficient information to code with codable children: Secondary | ICD-10-CM | POA: Diagnosis not present

## 2014-03-29 DIAGNOSIS — E785 Hyperlipidemia, unspecified: Secondary | ICD-10-CM | POA: Diagnosis not present

## 2014-03-29 DIAGNOSIS — F988 Other specified behavioral and emotional disorders with onset usually occurring in childhood and adolescence: Secondary | ICD-10-CM | POA: Diagnosis present

## 2014-03-29 DIAGNOSIS — Z4789 Encounter for other orthopedic aftercare: Secondary | ICD-10-CM | POA: Diagnosis not present

## 2014-03-29 DIAGNOSIS — M00869 Arthritis due to other bacteria, unspecified knee: Secondary | ICD-10-CM | POA: Diagnosis present

## 2014-03-29 DIAGNOSIS — R079 Chest pain, unspecified: Secondary | ICD-10-CM | POA: Diagnosis not present

## 2014-03-29 DIAGNOSIS — M79609 Pain in unspecified limb: Secondary | ICD-10-CM | POA: Diagnosis not present

## 2014-03-29 DIAGNOSIS — I82403 Acute embolism and thrombosis of unspecified deep veins of lower extremity, bilateral: Secondary | ICD-10-CM | POA: Diagnosis present

## 2014-03-29 DIAGNOSIS — M009 Pyogenic arthritis, unspecified: Principal | ICD-10-CM

## 2014-03-29 DIAGNOSIS — I824Z9 Acute embolism and thrombosis of unspecified deep veins of unspecified distal lower extremity: Secondary | ICD-10-CM | POA: Diagnosis not present

## 2014-03-29 DIAGNOSIS — M90869 Osteopathy in diseases classified elsewhere, unspecified lower leg: Secondary | ICD-10-CM | POA: Diagnosis not present

## 2014-03-29 DIAGNOSIS — M1710 Unilateral primary osteoarthritis, unspecified knee: Secondary | ICD-10-CM | POA: Diagnosis present

## 2014-03-29 DIAGNOSIS — Z5189 Encounter for other specified aftercare: Secondary | ICD-10-CM | POA: Diagnosis not present

## 2014-03-29 DIAGNOSIS — Z01818 Encounter for other preprocedural examination: Secondary | ICD-10-CM | POA: Diagnosis not present

## 2014-03-29 DIAGNOSIS — B998 Other infectious disease: Secondary | ICD-10-CM | POA: Diagnosis not present

## 2014-03-29 HISTORY — PX: KNEE ARTHROSCOPY: SHX127

## 2014-03-29 LAB — SYNOVIAL CELL COUNT + DIFF, W/ CRYSTALS
Crystals, Fluid: NONE SEEN
EOSINOPHILS-SYNOVIAL: 0 % (ref 0–1)
Eosinophils-Synovial: 0 % (ref 0–1)
Lymphocytes-Synovial Fld: 2 % (ref 0–20)
Lymphocytes-Synovial Fld: 1 % (ref 0–20)
Monocyte-Macrophage-Synovial Fluid: 7 % — ABNORMAL LOW (ref 50–90)
Monocyte-Macrophage-Synovial Fluid: 10 % — ABNORMAL LOW (ref 50–90)
NEUTROPHIL, SYNOVIAL: 91 % — AB (ref 0–25)
Neutrophil, Synovial: 89 % — ABNORMAL HIGH (ref 0–25)
OTHER CELLS-SYN: 0
Other Cells-SYN: 0
WBC, Synovial: 86500 /mm3 — ABNORMAL HIGH (ref 0–200)
WBC, Synovial: 140000 /mm3 — ABNORMAL HIGH (ref 0–200)

## 2014-03-29 LAB — GLUCOSE, CAPILLARY
GLUCOSE-CAPILLARY: 205 mg/dL — AB (ref 70–99)
Glucose-Capillary: 158 mg/dL — ABNORMAL HIGH (ref 70–99)
Glucose-Capillary: 144 mg/dL — ABNORMAL HIGH (ref 70–99)
Glucose-Capillary: 151 mg/dL — ABNORMAL HIGH (ref 70–99)

## 2014-03-29 SURGERY — ARTHROSCOPY, KNEE
Anesthesia: General | Site: Knee | Laterality: Bilateral

## 2014-03-29 MED ORDER — DEXAMETHASONE SODIUM PHOSPHATE 10 MG/ML IJ SOLN
INTRAMUSCULAR | Status: AC
  Filled 2014-03-29: qty 1

## 2014-03-29 MED ORDER — LIDOCAINE HCL (CARDIAC) 20 MG/ML IV SOLN
INTRAVENOUS | Status: AC
  Filled 2014-03-29: qty 5

## 2014-03-29 MED ORDER — LIDOCAINE HCL (CARDIAC) 20 MG/ML IV SOLN
INTRAVENOUS | Status: DC | PRN
  Administered 2014-03-29: 20 mg via INTRAVENOUS

## 2014-03-29 MED ORDER — DEXTROSE 5 % IV SOLN
1.0000 g | INTRAVENOUS | Status: DC
  Administered 2014-03-29 – 2014-04-04 (×7): 1 g via INTRAVENOUS
  Filled 2014-03-29 (×8): qty 10

## 2014-03-29 MED ORDER — METOCLOPRAMIDE HCL 5 MG/ML IJ SOLN
5.0000 mg | Freq: Three times a day (TID) | INTRAMUSCULAR | Status: DC | PRN
  Filled 2014-03-29: qty 2

## 2014-03-29 MED ORDER — LACTATED RINGERS IV SOLN
INTRAVENOUS | Status: DC
  Administered 2014-03-29: 14:00:00 via INTRAVENOUS

## 2014-03-29 MED ORDER — ASPIRIN 325 MG PO TABS
325.0000 mg | ORAL_TABLET | Freq: Every day | ORAL | Status: DC
  Administered 2014-03-29 – 2014-03-30 (×2): 325 mg via ORAL
  Filled 2014-03-29 (×3): qty 1

## 2014-03-29 MED ORDER — LISINOPRIL 20 MG PO TABS
20.0000 mg | ORAL_TABLET | Freq: Every day | ORAL | Status: DC
  Administered 2014-03-29 – 2014-04-05 (×8): 20 mg via ORAL
  Filled 2014-03-29 (×8): qty 1

## 2014-03-29 MED ORDER — SIMVASTATIN 20 MG PO TABS
20.0000 mg | ORAL_TABLET | Freq: Every day | ORAL | Status: DC
  Administered 2014-03-29 – 2014-04-05 (×8): 20 mg via ORAL
  Filled 2014-03-29 (×8): qty 1

## 2014-03-29 MED ORDER — ROCURONIUM BROMIDE 50 MG/5ML IV SOLN
INTRAVENOUS | Status: AC
  Filled 2014-03-29: qty 1

## 2014-03-29 MED ORDER — ONDANSETRON HCL 4 MG/2ML IJ SOLN
INTRAMUSCULAR | Status: DC | PRN
  Administered 2014-03-29: 4 mg via INTRAVENOUS

## 2014-03-29 MED ORDER — PROPOFOL 10 MG/ML IV BOLUS
INTRAVENOUS | Status: DC | PRN
  Administered 2014-03-29: 150 mg via INTRAVENOUS
  Administered 2014-03-29: 50 mg via INTRAVENOUS

## 2014-03-29 MED ORDER — IBUPROFEN 400 MG PO TABS
400.0000 mg | ORAL_TABLET | Freq: Three times a day (TID) | ORAL | Status: DC | PRN
  Filled 2014-03-29: qty 2

## 2014-03-29 MED ORDER — MORPHINE SULFATE 2 MG/ML IJ SOLN
1.0000 mg | INTRAMUSCULAR | Status: DC | PRN
  Administered 2014-03-29 – 2014-04-03 (×5): 1 mg via INTRAVENOUS
  Filled 2014-03-29 (×6): qty 1

## 2014-03-29 MED ORDER — METFORMIN HCL ER 750 MG PO TB24
2000.0000 mg | ORAL_TABLET | Freq: Every day | ORAL | Status: DC
  Administered 2014-03-29 – 2014-04-04 (×7): 2000 mg via ORAL
  Filled 2014-03-29 (×8): qty 1

## 2014-03-29 MED ORDER — CLONIDINE HCL (ANALGESIA) 100 MCG/ML EP SOLN
150.0000 ug | Freq: Once | EPIDURAL | Status: DC
  Filled 2014-03-29: qty 1.5

## 2014-03-29 MED ORDER — VANCOMYCIN HCL IN DEXTROSE 1-5 GM/200ML-% IV SOLN
1000.0000 mg | Freq: Two times a day (BID) | INTRAVENOUS | Status: DC
  Administered 2014-03-30 – 2014-03-31 (×4): 1000 mg via INTRAVENOUS
  Filled 2014-03-29 (×6): qty 200

## 2014-03-29 MED ORDER — SODIUM CHLORIDE 0.9 % IR SOLN
Status: DC | PRN
  Administered 2014-03-29: 1800 mL

## 2014-03-29 MED ORDER — OXYCODONE HCL 5 MG PO TABS: 5.0000 mg | ORAL_TABLET | Freq: Once | ORAL | Status: DC | PRN

## 2014-03-29 MED ORDER — OXYCODONE HCL 5 MG PO TABS
5.0000 mg | ORAL_TABLET | ORAL | Status: DC | PRN
  Administered 2014-03-29: 5 mg via ORAL
  Administered 2014-03-30: 10 mg via ORAL
  Administered 2014-03-30: 5 mg via ORAL
  Administered 2014-03-30 – 2014-04-05 (×24): 10 mg via ORAL
  Filled 2014-03-29 (×5): qty 2
  Filled 2014-03-29: qty 1
  Filled 2014-03-29 (×19): qty 2
  Filled 2014-03-29: qty 1
  Filled 2014-03-29: qty 2

## 2014-03-29 MED ORDER — DEXAMETHASONE SODIUM PHOSPHATE 10 MG/ML IJ SOLN
INTRAMUSCULAR | Status: DC | PRN
  Administered 2014-03-29: 10 mg via INTRAVENOUS

## 2014-03-29 MED ORDER — FENTANYL CITRATE 0.05 MG/ML IJ SOLN
INTRAMUSCULAR | Status: AC
  Filled 2014-03-29: qty 5

## 2014-03-29 MED ORDER — METHOCARBAMOL 500 MG PO TABS
500.0000 mg | ORAL_TABLET | Freq: Four times a day (QID) | ORAL | Status: DC | PRN
  Administered 2014-03-30 – 2014-04-03 (×7): 500 mg via ORAL
  Filled 2014-03-29 (×7): qty 1

## 2014-03-29 MED ORDER — MORPHINE SULFATE 4 MG/ML IJ SOLN
INTRAMUSCULAR | Status: DC | PRN
  Administered 2014-03-29: 4 mg via INTRAVENOUS

## 2014-03-29 MED ORDER — ONDANSETRON HCL 4 MG/2ML IJ SOLN
INTRAMUSCULAR | Status: AC
  Filled 2014-03-29: qty 2

## 2014-03-29 MED ORDER — LABETALOL HCL 5 MG/ML IV SOLN
INTRAVENOUS | Status: AC
  Filled 2014-03-29: qty 4

## 2014-03-29 MED ORDER — CLONIDINE HCL (ANALGESIA) 100 MCG/ML EP SOLN
EPIDURAL | Status: DC | PRN
  Administered 2014-03-29: 100 ug via INTRA_ARTICULAR

## 2014-03-29 MED ORDER — VANCOMYCIN HCL 10 G IV SOLR
1750.0000 mg | Freq: Once | INTRAVENOUS | Status: AC
  Administered 2014-03-29: 1750 mg via INTRAVENOUS
  Filled 2014-03-29: qty 1750

## 2014-03-29 MED ORDER — FENTANYL CITRATE 0.05 MG/ML IJ SOLN
INTRAMUSCULAR | Status: DC | PRN
  Administered 2014-03-29: 100 ug via INTRAVENOUS
  Administered 2014-03-29 (×2): 50 ug via INTRAVENOUS
  Administered 2014-03-29 (×2): 100 ug via INTRAVENOUS

## 2014-03-29 MED ORDER — PIPERACILLIN-TAZOBACTAM 3.375 G IVPB
3.3750 g | Freq: Three times a day (TID) | INTRAVENOUS | Status: DC
  Filled 2014-03-29 (×2): qty 50

## 2014-03-29 MED ORDER — ONDANSETRON HCL 4 MG/2ML IJ SOLN: 4.0000 mg | Freq: Four times a day (QID) | INTRAMUSCULAR | Status: DC | PRN

## 2014-03-29 MED ORDER — MORPHINE SULFATE 4 MG/ML IJ SOLN
INTRAMUSCULAR | Status: AC
  Filled 2014-03-29: qty 2

## 2014-03-29 MED ORDER — PREDNISONE 20 MG PO TABS
20.0000 mg | ORAL_TABLET | Freq: Two times a day (BID) | ORAL | Status: DC
  Administered 2014-03-29 – 2014-04-01 (×6): 20 mg via ORAL
  Filled 2014-03-29 (×7): qty 1

## 2014-03-29 MED ORDER — ONDANSETRON HCL 4 MG/2ML IJ SOLN
4.0000 mg | Freq: Four times a day (QID) | INTRAMUSCULAR | Status: DC | PRN
  Filled 2014-03-29: qty 2

## 2014-03-29 MED ORDER — HYDROMORPHONE HCL PF 1 MG/ML IJ SOLN: 0.2500 mg | INTRAMUSCULAR | Status: DC | PRN

## 2014-03-29 MED ORDER — PROPOFOL 10 MG/ML IV BOLUS
INTRAVENOUS | Status: AC
  Filled 2014-03-29: qty 20

## 2014-03-29 MED ORDER — ONDANSETRON HCL 4 MG PO TABS: 4.0000 mg | ORAL_TABLET | Freq: Four times a day (QID) | ORAL | Status: DC | PRN

## 2014-03-29 MED ORDER — COLCHICINE 0.6 MG PO TABS
0.6000 mg | ORAL_TABLET | Freq: Every day | ORAL | Status: DC
  Administered 2014-03-29 – 2014-04-05 (×8): 0.6 mg via ORAL
  Filled 2014-03-29 (×8): qty 1

## 2014-03-29 MED ORDER — LABETALOL HCL 5 MG/ML IV SOLN
INTRAVENOUS | Status: DC | PRN
  Administered 2014-03-29: 5 mg via INTRAVENOUS

## 2014-03-29 MED ORDER — METHOCARBAMOL 1000 MG/10ML IJ SOLN
500.0000 mg | Freq: Four times a day (QID) | INTRAVENOUS | Status: DC | PRN
  Administered 2014-04-01: 500 mg via INTRAVENOUS
  Filled 2014-03-29 (×2): qty 5

## 2014-03-29 MED ORDER — OXYCODONE HCL 5 MG/5ML PO SOLN: 5.0000 mg | Freq: Once | ORAL | Status: DC | PRN

## 2014-03-29 MED ORDER — METOCLOPRAMIDE HCL 10 MG PO TABS: 5.0000 mg | ORAL_TABLET | Freq: Three times a day (TID) | ORAL | Status: DC | PRN

## 2014-03-29 MED ORDER — BUPIVACAINE HCL (PF) 0.25 % IJ SOLN
INTRAMUSCULAR | Status: DC | PRN
  Administered 2014-03-29: 20 mL

## 2014-03-29 MED ORDER — LACTATED RINGERS IV SOLN
INTRAVENOUS | Status: DC | PRN
  Administered 2014-03-29: 16:00:00 via INTRAVENOUS

## 2014-03-29 MED ORDER — BUPIVACAINE HCL (PF) 0.25 % IJ SOLN
INTRAMUSCULAR | Status: AC
  Filled 2014-03-29: qty 30

## 2014-03-29 SURGICAL SUPPLY — 70 items
BAG ISL DRAPE 18X18 STRL (DRAPES) ×1
BAG ISOLATION DRAPE 18X18 (DRAPES) IMPLANT
BANDAGE ELASTIC 6 VELCRO ST LF (GAUZE/BANDAGES/DRESSINGS) ×2 IMPLANT
BANDAGE ESMARK 6X9 LF (GAUZE/BANDAGES/DRESSINGS) IMPLANT
BLADE 10 SAFETY STRL DISP (BLADE) ×3 IMPLANT
BLADE CUDA 5.5 (BLADE) IMPLANT
BLADE GREAT WHITE 4.2 (BLADE) ×2 IMPLANT
BLADE GREAT WHITE 4.2MM (BLADE) ×2
BLADE SURG 11 STRL SS (BLADE) ×4 IMPLANT
BLADE SURG ROTATE 9660 (MISCELLANEOUS) IMPLANT
BNDG CMPR 9X6 STRL LF SNTH (GAUZE/BANDAGES/DRESSINGS)
BNDG COHESIVE 3X5 TAN STRL LF (GAUZE/BANDAGES/DRESSINGS) ×2 IMPLANT
BNDG ESMARK 6X9 LF (GAUZE/BANDAGES/DRESSINGS)
BUR OVAL 6.0 (BURR) IMPLANT
COVER SURGICAL LIGHT HANDLE (MISCELLANEOUS) ×3 IMPLANT
CUFF TOURNIQUET SINGLE 34IN LL (TOURNIQUET CUFF) ×8 IMPLANT
CUFF TOURNIQUET SINGLE 44IN (TOURNIQUET CUFF) IMPLANT
DRAPE ARTHROSCOPY W/POUCH 114 (DRAPES) ×5 IMPLANT
DRAPE IMP U-DRAPE 54X76 (DRAPES) ×4 IMPLANT
DRAPE INCISE IOBAN 66X45 STRL (DRAPES) IMPLANT
DRAPE ISOLATION BAG 18X18 (DRAPES) ×2
DRAPE PROXIMA HALF (DRAPES) IMPLANT
DRAPE U-SHAPE 47X51 STRL (DRAPES) ×7 IMPLANT
DRSG PAD ABDOMINAL 8X10 ST (GAUZE/BANDAGES/DRESSINGS) ×2 IMPLANT
DURAPREP 26ML APPLICATOR (WOUND CARE) ×5 IMPLANT
GAUZE XEROFORM 1X8 LF (GAUZE/BANDAGES/DRESSINGS) ×2 IMPLANT
GLOVE BIOGEL PI IND STRL 7.0 (GLOVE) IMPLANT
GLOVE BIOGEL PI IND STRL 8 (GLOVE) ×1 IMPLANT
GLOVE BIOGEL PI INDICATOR 7.0 (GLOVE) ×4
GLOVE BIOGEL PI INDICATOR 8 (GLOVE) ×2
GLOVE SURG ORTHO 8.0 STRL STRW (GLOVE) ×3 IMPLANT
GOWN STRL REUS W/ TWL LRG LVL3 (GOWN DISPOSABLE) ×2 IMPLANT
GOWN STRL REUS W/ TWL XL LVL3 (GOWN DISPOSABLE) ×1 IMPLANT
GOWN STRL REUS W/TWL LRG LVL3 (GOWN DISPOSABLE) ×6
GOWN STRL REUS W/TWL XL LVL3 (GOWN DISPOSABLE) ×3
KIT BASIN OR (CUSTOM PROCEDURE TRAY) ×3 IMPLANT
KIT ROOM TURNOVER OR (KITS) ×3 IMPLANT
MANIFOLD NEPTUNE II (INSTRUMENTS) ×2 IMPLANT
NDL 18GX1X1/2 (RX/OR ONLY) (NEEDLE) IMPLANT
NDL HYPO 25GX1X1/2 BEV (NEEDLE) ×1 IMPLANT
NDL SPNL 18GX3.5 QUINCKE PK (NEEDLE) IMPLANT
NEEDLE 18GX1X1/2 (RX/OR ONLY) (NEEDLE) ×12 IMPLANT
NEEDLE HYPO 25GX1X1/2 BEV (NEEDLE) ×3 IMPLANT
NEEDLE SPNL 18GX3.5 QUINCKE PK (NEEDLE) ×9 IMPLANT
NS IRRIG 1000ML POUR BTL (IV SOLUTION) ×2 IMPLANT
PACK ARTHROSCOPY DSU (CUSTOM PROCEDURE TRAY) ×3 IMPLANT
PAD ARMBOARD 7.5X6 YLW CONV (MISCELLANEOUS) ×6 IMPLANT
PADDING CAST ABS 6INX4YD NS (CAST SUPPLIES) ×2
PADDING CAST ABS COTTON 6X4 NS (CAST SUPPLIES) IMPLANT
PADDING CAST COTTON 6X4 STRL (CAST SUPPLIES) ×3 IMPLANT
SET ARTHROSCOPY TUBING (MISCELLANEOUS) ×3
SET ARTHROSCOPY TUBING LN (MISCELLANEOUS) ×1 IMPLANT
SPONGE GAUZE 4X4 12PLY (GAUZE/BANDAGES/DRESSINGS) IMPLANT
SPONGE GAUZE 4X4 12PLY STER LF (GAUZE/BANDAGES/DRESSINGS) ×2 IMPLANT
SPONGE LAP 4X18 X RAY DECT (DISPOSABLE) ×3 IMPLANT
STOCKINETTE IMPERVIOUS 9X36 MD (GAUZE/BANDAGES/DRESSINGS) ×2 IMPLANT
SUT ETHILON 3 0 PS 1 (SUTURE) ×4 IMPLANT
SUT MENISCAL KIT (KITS) IMPLANT
SWAB COLLECTION DEVICE MRSA (MISCELLANEOUS) ×2 IMPLANT
SYR 20ML ECCENTRIC (SYRINGE) ×7 IMPLANT
SYR CONTROL 10ML LL (SYRINGE) IMPLANT
SYR TB 1ML LUER SLIP (SYRINGE) ×3 IMPLANT
TOWEL OR 17X24 6PK STRL BLUE (TOWEL DISPOSABLE) ×3 IMPLANT
TOWEL OR 17X26 10 PK STRL BLUE (TOWEL DISPOSABLE) ×3 IMPLANT
TUBE ANAEROBIC SPECIMEN COL (MISCELLANEOUS) ×2 IMPLANT
TUBE CONNECTING 12'X1/4 (SUCTIONS) ×1
TUBE CONNECTING 12X1/4 (SUCTIONS) ×2 IMPLANT
WAND 90 DEG TURBOVAC W/CORD (SURGICAL WAND) ×2 IMPLANT
WAND HAND CNTRL MULTIVAC 90 (MISCELLANEOUS) ×3 IMPLANT
WATER STERILE IRR 1000ML POUR (IV SOLUTION) ×3 IMPLANT

## 2014-03-29 NOTE — Op Note (Signed)
03/29/2014  5:25 PM  PATIENT:  [name]  73 y.o. male  PRE-OPERATIVE DIAGNOSIS:  RIGHT KNEE SPONTANEOUS INFECTION  POST-OPERATIVE DIAGNOSIS:  RIGHT KNEE SPONTANEOUS INFECTION  PROCEDURE:  Procedure(s): RIGHT KNEE ARTHROSCOPY AND DEBRIDEMENT, LEFT KNEE ASPIRATION,  LEFT KNEE ARTHROSCOPY AND DEBRIDEMENT.  SURGEON:  Surgeon(s): Meredith Pel, MD  ASSISTANT: none  ANESTHESIA:   general  EBL: 25 ml    Total I/O In: 1300 [I.V.:1300] Out: -   BLOOD ADMINISTERED: none  DRAINS: none   LOCAL MEDICATIONS USED:  Marcaine mso4 clonidine  SPECIMEN:  R/l knee joint fluid to micro  COUNTS:  YES  TOURNIQUET:    DICTATION: .Other Dictation: Dictation Number [id number]  PLAN OF CARE: Admit for overnight observation  PATIENT DISPOSITION:  PACU - hemodynamically stable

## 2014-03-29 NOTE — Consult Note (Addendum)
[facility] for Infectious Disease  Date of Admission:  03/29/2014  Date of Consult:  03/29/2014  Reason for Consult: septic arthritis Referring Physician: Marlou Sa  Impression/Recommendation Septic Arthritis DM2 HTN  Would Await [facility] Start vanco/ceftriaxone Control sugar, check A1C Improve BP control to improve headaches.   Comment- unusual case of gout confirmed by microscopy that led into infection.  Hopefully his I & D has him on the way to clearance.   Thank you so much for this interesting consult,   [name] (pager) [phone] [email or web address]  [name] is an 73 y.o. male.  HPI: 73 yo M with hx of R knee pain that was seen 3 weeks ago. He underwent aspiration at that time which revealed crystals and he received a steroid injection into his knee. He improved but then developed worsening swelling and pain of his knee. He was seen in ED 5-7 after developing difficulty with weight bearing. He had u/s (-) for DVT. He was d/c on continued prednisone, colchicine.  He returns today after a repeat arthrocentesis showed 55k WBC and no crystals. He underwent I & D today.  States he did have f/c on one night prior to arrival. No proximal swelling or erythema.   Past Medical History  Diagnosis Date  . Attention deficit disorder   . Diabetes mellitus without complication   . Hyperlipidemia   . Hypertension   . Blind left eye     Past Surgical History  Procedure Laterality Date  . Lipoma removal, right upper back    . Eye surgery Left     x3  . Tonsillectomy       Not on File  Medications:  Scheduled: . cloNIDine  150 mcg Intra-articular Once    Abtx:  Anti-infectives   None      Total days of antibiotics 0          Social History:  reports that he has quit smoking. He does not have any smokeless tobacco history on file. He reports that he does not drink alcohol or use illicit drugs.  Family History  Problem Relation Age  of Onset  . Heart attack Father   . Heart attack Sister   . Heart attack Brother     General ROS: blind in L eye after trauma, +headaches, no sob, no cough, normal BM, urine has been cloudy, see HPI.   Blood pressure 138/100, pulse 87, temperature 97.8 F (36.6 C), temperature source Oral, resp. rate 18, height 5\' 8"  (1.727 m), weight 90.719 kg (200 lb), SpO2 100.00%. General appearance: alert, cooperative and no distress Eyes: R eye normal, L eye cloudy.  Throat: normal findings: oropharynx pink & moist without lesions or evidence of thrush and abnormal findings: dentition: edentulous Neck: no adenopathy and supple, symmetrical, trachea midline Lungs: clear to auscultation bilaterally Heart: regular rate and rhythm Abdomen: normal findings: bowel sounds normal and soft, non-tender Extremities: L knee wrapped, tender, no proximal heat or swelling.    Results for orders placed during the hospital encounter of 03/29/14 (from the past 48 hour(s))  GLUCOSE, CAPILLARY     Status: Abnormal   Collection Time    03/29/14  1:16 PM      Result Value Ref Range   Glucose-Capillary 158 (*) 70 - 99 mg/dL  GLUCOSE, CAPILLARY     Status: Abnormal   Collection Time    03/29/14  3:47 PM      Result Value Ref Range  Glucose-Capillary 144 (*) 70 - 99 mg/dL   No results found for this basename: sdes, specrequest, cult, reptstatus   Dg Chest 2 View  03/29/2014   CLINICAL DATA:  Preoperative study prior to arthroscopy  EXAM: CHEST  2 VIEW  COMPARISON:  None.  FINDINGS: The lungs are adequately inflated and clear. The cardiopericardial silhouette is normal in size. The pulmonary vascularity is not engorged. The mediastinum is normal in width. There is no pleural effusion. The observed portions of the bony thorax appear normal.  IMPRESSION: There is no evidence of active cardiopulmonary disease.   Electronically Signed   By: David  Martinique   On: 03/29/2014 14:00   No results found for this or any  previous visit (from the past 240 hour(s)).    03/29/2014, 4:54 PM     LOS: 0 days     **Disclaimer: This note may have been dictated with voice recognition software. Similar sounding words can inadvertently be transcribed and this note may contain transcription errors which may not have been corrected upon publication of note.**

## 2014-03-29 NOTE — Transfer of Care (Signed)
Immediate Anesthesia Transfer of Care Note  Patient: [name]  Procedure(s) Performed: Procedure(s) with comments: RIGHT KNEE ARTHROSCOPY AND DEBRIDEMENT, LEFT KNEE ASPIRATION,  LEFT KNEE ARTHROSCOPY AND DEBRIDEMENT. (Bilateral) - RIGHT KNEE ARTHROSCOPY AND DEBRIDEMENT, LEFT KNEE ASPIRATION,  LEFT KNEE ARTHROSCOPY AND DEBRIDEMENT.  Patient Location: PACU  Anesthesia Type:General  Level of Consciousness: awake, alert  and patient cooperative  Airway & Oxygen Therapy: Patient Spontanous Breathing and Patient connected to face mask oxygen  Post-op Assessment: Report given to PACU RN, Post -op Vital signs reviewed and stable and Patient moving all extremities  Post vital signs: Reviewed and stable  Complications: No apparent anesthesia complications

## 2014-03-29 NOTE — Progress Notes (Signed)
ANTIBIOTIC CONSULT NOTE - INITIAL  Pharmacy Consult for vancomycin and zosyn Indication: surgical prophylaxis, wound infection   Patient Measurements: Height: 5\' 8"  (172.7 cm) Weight: 200 lb (90.719 kg) IBW/kg (Calculated) : 68.4  Vital Signs: Temp: 98.5 F (36.9 C) (05/12 1740) Temp src: Oral (05/12 1319) BP: 145/73 mmHg (05/12 1845) Pulse Rate: 81 (05/12 1845) Intake/Output from previous day:   Intake/Output from this shift: Total I/O In: 1300 [I.V.:1300] Out: -   Labs: No results found for this basename: WBC, HGB, PLT, LABCREA, CREATININE,  in the last 72 hours Estimated Creatinine Clearance: 73 ml/min (by C-G formula based on Cr of 1). No results found for this basename: VANCOTROUGH, VANCOPEAK, VANCORANDOM, GENTTROUGH, GENTPEAK, GENTRANDOM, TOBRATROUGH, TOBRAPEAK, TOBRARND, AMIKACINPEAK, AMIKACINTROU, AMIKACIN,  in the last 72 hours   Microbiology: No results found for this or any previous visit (from the past 720 hour(s)).  Medical History: Past Medical History  Diagnosis Date  . Attention deficit disorder   . Diabetes mellitus without complication   . Hyperlipidemia   . Hypertension   . Blind left eye     Medications:  Prescriptions prior to admission  Medication Sig Dispense Refill  . aspirin 81 MG tablet Take 81 mg by mouth daily.      . colchicine 0.6 MG tablet Take 1 tablet (0.6 mg total) by mouth daily.  30 tablet  0  . ibuprofen (ADVIL,MOTRIN) 200 MG tablet Take 400-800 mg by mouth 3 (three) times daily as needed for mild pain.      Marland Kitchen lisinopril (PRINIVIL,ZESTRIL) 20 MG tablet Take 20 mg by mouth daily.      . metFORMIN (GLUCOPHAGE-XR) 500 MG 24 hr tablet Take 2,000 mg by mouth at bedtime.       Marland Kitchen oxyCODONE-acetaminophen (PERCOCET/ROXICET) 5-325 MG per tablet Take 2 tablets by mouth every 4 (four) hours as needed for severe pain.  20 tablet  0  . predniSONE (DELTASONE) 20 MG tablet Take 1 tablet (20 mg total) by mouth 2 (two) times daily.  10 tablet  0   . simvastatin (ZOCOR) 20 MG tablet Take 20 mg by mouth daily.       Assessment: 73 yo man s/p right and left knee debridement to start vancomycin and zosyn.  His CrCl ~73 ml/min.    Goal of Therapy:  Vancomycin trough level 10-15 mcg/ml Eradication of infection  Plan:  Vancomycin 1750 mg IV X 1 then 1gm IV q12 hours Zosyn 3.375 gm IV q8 hours Please clarify LOT vancomycin if only for surgical prophylaxis Monitor renal function, cultures and clinical progress  Thanks for allowing pharmacy to be a part of this patient's care.  Excell Seltzer, PharmD Clinical Pharmacist, [phone] 03/29/2014,6:57 PM

## 2014-03-29 NOTE — Anesthesia Preprocedure Evaluation (Addendum)
Anesthesia Evaluation  Patient identified by MRN, date of birth, ID band Patient awake    Reviewed: Allergy & Precautions, H&P , NPO status , Patient's Chart, lab work & pertinent test results  Airway Mallampati: II  Neck ROM: full    Dental   Pulmonary former smoker,          Cardiovascular hypertension,     Neuro/Psych    GI/Hepatic   Endo/Other  diabetes, Type 2  Renal/GU      Musculoskeletal   Abdominal   Peds  Hematology   Anesthesia Other Findings   Reproductive/Obstetrics                          Anesthesia Physical Anesthesia Plan  ASA: II  Anesthesia Plan: General   Post-op Pain Management:    Induction: Intravenous  Airway Management Planned: LMA  Additional Equipment:   Intra-op Plan:   Post-operative Plan:   Informed Consent: I have reviewed the patients History and Physical, chart, labs and discussed the procedure including the risks, benefits and alternatives for the proposed anesthesia with the patient or authorized representative who has indicated his/her understanding and acceptance.     Plan Discussed with: CRNA, Anesthesiologist and Surgeon  Anesthesia Plan Comments:         Anesthesia Quick Evaluation

## 2014-03-29 NOTE — H&P (Signed)
[name] is an 73 y.o. male.   Chief Complaint: Right knee pain HPI: [name] is a 73 year old patient with several week history of right knee pain. He was seen in the clinic approximately 3 weeks ago at which time knee aspiration was performed. Fluid was positive for intracellular LR crystals. Cortisone injection performed the patient did well for a day or 2. He reported recurrent swelling in the knee. This into the emergency room on 2 occasions. Once on May 3 month on May 7. He has not had any fever or chills but has had progressive debilitating right knee pain to the point where our having to get around in a wheelchair. He denies any left knee symptoms. He has no prosthetic devices.  Past Medical History  Diagnosis Date  . Attention deficit disorder   . Diabetes mellitus without complication   . Hyperlipidemia   . Hypertension   . Blind left eye     Past Surgical History  Procedure Laterality Date  . Lipoma removal, right upper back      Family History  Problem Relation Age of Onset  . Heart attack Father   . Heart attack Sister   . Heart attack Brother    Social History:  reports that he has quit smoking. He does not have any smokeless tobacco history on file. He reports that he does not drink alcohol or use illicit drugs.  Allergies: No Known Allergies  No prescriptions prior to admission    No results found for this or any previous visit (from the past 48 hour(s)). No results found.  Review of Systems  Constitutional: Negative.   HENT: Negative.   Eyes: Negative.   Respiratory: Negative.   Cardiovascular: Negative.   Gastrointestinal: Negative.   Genitourinary: Negative.   Musculoskeletal: Positive for joint pain.  Skin: Negative.   Neurological: Negative.   Endo/Heme/Allergies: Negative.   Psychiatric/Behavioral: Negative.     There were no vitals taken for this visit. Physical Exam  Constitutional: He appears well-developed.  HENT:  Head:  Normocephalic.  Eyes: Pupils are equal, round, and reactive to light.  Neck: Normal range of motion.  Cardiovascular: Normal rate.   Respiratory: Effort normal.  Neurological: He is alert.  Skin: Skin is warm.  Psychiatric: He has a normal mood and affect.   right knee demonstrates effusion typical range of motion including extension pedal pulses palpable there is no proximal lymphadenopathy collateral cruciates are stable extensor mechanism is intact  Assessment/Plan Impression is right knee effusion. Aspiration the clinic yesterday demonstrated cloudy fluid with a white count of 55,000. There were no crystals present. The patient's sedimentation rate C-reactive protein also elevated to 110 and approximately 13 respectively. Impression at this time is significantly elevated white count in the right knee. Concern about infection at this time particularly with his progressively declining clinical ambulatory course. Plan is for admission ID consult arthroscopic I&D with antibiotics to follow. All questions answered. We'll plan for at least overnight admission.  [name] [name] 03/29/2014, 10:36 AM

## 2014-03-30 DIAGNOSIS — M009 Pyogenic arthritis, unspecified: Secondary | ICD-10-CM

## 2014-03-30 LAB — GLUCOSE, CAPILLARY
GLUCOSE-CAPILLARY: 144 mg/dL — AB (ref 70–99)
Glucose-Capillary: 141 mg/dL — ABNORMAL HIGH (ref 70–99)
Glucose-Capillary: 140 mg/dL — ABNORMAL HIGH (ref 70–99)
Glucose-Capillary: 152 mg/dL — ABNORMAL HIGH (ref 70–99)

## 2014-03-30 LAB — HEMOGLOBIN A1C
Hgb A1c MFr Bld: 7.5 % — ABNORMAL HIGH (ref <5.7)
Mean Plasma Glucose: 169 mg/dL — ABNORMAL HIGH (ref <117)

## 2014-03-30 MED ORDER — SODIUM CHLORIDE 0.9 % IJ SOLN
10.0000 mL | INTRAMUSCULAR | Status: DC | PRN
  Administered 2014-03-31 – 2014-04-01 (×2): 10 mL
  Administered 2014-04-02: 20 mL
  Administered 2014-04-04 (×2): 10 mL

## 2014-03-30 MED ORDER — INSULIN ASPART 100 UNIT/ML ~~LOC~~ SOLN
0.0000 [IU] | Freq: Three times a day (TID) | SUBCUTANEOUS | Status: DC
  Administered 2014-03-30 – 2014-03-31 (×4): 1 [IU] via SUBCUTANEOUS
  Administered 2014-03-31: 3 [IU] via SUBCUTANEOUS
  Administered 2014-03-31: 1 [IU] via SUBCUTANEOUS
  Administered 2014-04-01 – 2014-04-02 (×4): 2 [IU] via SUBCUTANEOUS
  Administered 2014-04-03 (×3): 1 [IU] via SUBCUTANEOUS
  Administered 2014-04-04: 2 [IU] via SUBCUTANEOUS
  Administered 2014-04-04 (×2): 1 [IU] via SUBCUTANEOUS
  Administered 2014-04-05: 2 [IU] via SUBCUTANEOUS
  Administered 2014-04-05: 1 [IU] via SUBCUTANEOUS

## 2014-03-30 NOTE — Evaluation (Signed)
Physical Therapy Evaluation Patient Details Name: [name] MRN: [id number] DOB: 1941-01-16 Today's Date: 03/30/2014   History of Present Illness  Pt is a 73 y.o. male s/p Right and left knee arthroscopy  and debridement; with Lt knee aspiration   Clinical Impression  Patient is s/p surgery listed above resulting in functional limitations due to the deficits listed below (see PT Problem List). Patient will benefit from skilled PT to increase their independence and safety with mobility to allow discharge to the venue listed below. Pt greatly limited in mobility for past two weeks. Pt was able to achieve sit to stand with mod (A but was unable to safely transfer to wheelchair using SPT. Pt performed lateral transfer to chair today with min (A). Pt to benefit from post acute skilled rehab to maximize independence prior to returning home with wife. Pt is agreeable to SNF at this time.      Follow Up Recommendations SNF;Supervision/Assistance - 24 hour    Equipment Recommendations  None recommended by PT    Recommendations for Other Services OT consult     Precautions / Restrictions Precautions Precautions: Fall Restrictions Weight Bearing Restrictions: No      Mobility  Bed Mobility Overal bed mobility: Needs Assistance Bed Mobility: Supine to Sit     Supine to sit: Min assist;HOB elevated     General bed mobility comments: requires incr time due to pain; min (A) to elevate trunk; cues for sequencing  Transfers Overall transfer level: Needs assistance Equipment used: Rolling walker (2 wheeled) Transfers: Sit to/from Stand;Lateral/Scoot Transfers Sit to Stand: Mod assist;From elevated surface        Lateral/Scoot Transfers: Min assist;From elevated surface General transfer comment: attempted sit to stand and was able to achieve with Rt LE flexed at all times and fwd flexed trunk; pt very unsetady with standing; required MOD (A) to achieve standing position with use of  gt belt; pt was able to perform lateral transfer safely with min (A); armrests can be lowered to facilitate lateral transfers   Ambulation/Gait                Stairs            Wheelchair Mobility    Modified Rankin (Stroke Patients Only)       Balance Overall balance assessment: Needs assistance Sitting-balance support: Feet supported;Single extremity supported Sitting balance-Leahy Scale: Poor   Postural control: Posterior lean Standing balance support: During functional activity;Bilateral upper extremity supported Standing balance-Leahy Scale: Zero Standing balance comment: requires mod (A) at all times to maintain balance and has difficulty achieveing upright standing position; relies heavily on bil UEs                              Pertinent Vitals/Pain 8/10 in Rt LE. patient repositioned for comfort     Home Living Family/patient expects to be discharged to:: Private residence Living Arrangements: Spouse/significant other Available Help at Discharge: [place]             Additional Comments: pt functional mobility has significantly declined in past 2 weeks; has required increased (A) at home and been wheelchair bound due to pain     Prior Function Level of Independence: Needs assistance   Gait / Transfers Assistance Needed: wheelchair bound with SPT only for past 2 weeks; has 2 friends who carry him up his [place] house   ADL's / Homemaking Assistance Needed: total (  A) from wife  for past 2 weeks   Comments: up until 2 weeks ago pt and wife reports pt was ambulatory and independent for all ADLS      Hand Dominance        Extremity/Trunk Assessment   Upper Extremity Assessment: Defer to OT evaluation           Lower Extremity Assessment: RLE deficits/detail;LLE deficits/detail RLE Deficits / Details: Rt knee in 25 degreee flexion contracture; limited by pain; LLE Deficits / Details: Lt knee in 10 degree flexion  contracture; limited by pain   Cervical / Trunk Assessment: Kyphotic  Communication   Communication: No difficulties  Cognition Arousal/Alertness: Awake/alert Behavior During Therapy: WFL for tasks assessed/performed Overall Cognitive Status: Within Functional Limits for tasks assessed                      General Comments      Exercises Low Level/ICU Exercises Ankle Circles/Pumps: AROM;Both;10 reps;Supine Other Exercises Other Exercises: encouraged pt to perform quad sets to facilitate extension in bil LEs       Assessment/Plan    PT Assessment Patient needs continued PT services  PT Diagnosis Difficulty walking;Generalized weakness;Acute pain   PT Problem List Decreased strength;Decreased range of motion;Decreased mobility;Decreased balance;Decreased activity tolerance;Decreased knowledge of use of DME;Decreased safety awareness;Decreased knowledge of precautions;Pain  PT Treatment Interventions DME instruction;Gait training;Functional mobility training;Therapeutic activities;Therapeutic exercise;Balance training;Neuromuscular re-education;Wheelchair mobility training;Patient/family education   PT Goals (Current goals can be found in the Care Plan section) Acute Rehab PT Goals Patient Stated Goal: to walk again  PT Goal Formulation: With patient Time For Goal Achievement: 04/06/14 Potential to Achieve Goals: Good    Frequency Min 3X/week   Barriers to discharge Decreased caregiver support      Co-evaluation               End of Session Equipment Utilized During Treatment: Gait belt Activity Tolerance: Patient limited by pain Patient left: in chair;with call bell/phone within reach;with family/visitor present Nurse Communication: Mobility status;Precautions    Functional Assessment Tool Used: clinical judgement Functional Limitation: Mobility: Walking and moving around Mobility: Walking and Moving Around Current Status [phone]): At least 20 percent but  less than 40 percent impaired, limited or restricted Mobility: Walking and Moving Around Goal Status [phone]): At least 1 percent but less than 20 percent impaired, limited or restricted    Time: 0929-0949 PT Time Calculation (min): 20 min   Charges:   PT Evaluation $Initial PT Evaluation Tier I: 1 Procedure PT Treatments $Therapeutic Activity: 23-37 mins   PT G Codes:   Functional Assessment Tool Used: clinical judgement Functional Limitation: Mobility: Walking and moving around    [place], [place]  [phone] 03/30/2014, 10:30 AM

## 2014-03-30 NOTE — Progress Notes (Signed)
Peripherally Inserted Central Catheter/Midline Placement  The IV Nurse has discussed with the patient and/or persons authorized to consent for the patient, the purpose of this procedure and the potential benefits and risks involved with this procedure.  The benefits include less needle sticks, lab draws from the catheter and patient may be discharged home with the catheter.  Risks include, but not limited to, infection, bleeding, blood clot (thrombus formation), and puncture of an artery; nerve damage and irregular heat beat.  Alternatives to this procedure were also discussed.  PICC/Midline Placement Documentation        Darlyn Read 03/30/2014, 6:23 PM

## 2014-03-30 NOTE — Progress Notes (Signed)
Pt arrive to unit in no s/s of distress. Pt A&Ox4. Wife at bedside. Pt oriented to room and unit. Whiteboard updated. Callbell within reach. Will continue to monitor. Skin c/d/i. Ace wraps noted bilateral knees - elevated and ice packs applied prior to pt's arrival. Charge nurse receive report prior to patient's arrival. VS stable. Pt request pain med and food - will check MD's orders and admin as ordered.

## 2014-03-30 NOTE — Op Note (Signed)
NAMESIMCHA, SPEIR NO.:  [id number]  MEDICAL RECORD NO.:  [id number]  LOCATION:  5W22C                        FACILITY:  [facility]  PHYSICIAN:  Anderson Malta, M.D.    DATE OF BIRTH:  Sep 03, 1941  DATE OF PROCEDURE:  03/29/2014 DATE OF DISCHARGE:                              OPERATIVE REPORT   PREOPERATIVE DIAGNOSIS:  Right knee infection, possible left knee infection.  POSTOPERATIVE DIAGNOSIS:  Right and left knee infection.  PROCEDURE:  Right and left knee arthroscopy and debridement washout along with left knee aspiration.  SURGEON:  Anderson Malta, M.D.  ASSISTANT:  None.  ANESTHESIA:  General.  INDICATION:  [name] is a 73 year old patient who presented to the office several weeks ago with right knee fluid with crystals present.  Aspiration and cortisone injection performed.  He subsequently re-presented a week or two later with increasing pain in the right knee. Aspiration of the right knee in the clinic yesterday demonstrated white count of 55,000 along with no crystals.  Presents now for operative management of the right knee with aspiration and possible arthroscopic debridement of the left knee.  PROCEDURE IN DETAIL:  The patient was brought to the operating room, where general endotracheal anesthesia was induced.  Preoperative antibiotics were administered.  Time-out was called.  Right leg was prepped with DuraPrep solution and draped in sterile manner.  Effusion was present.  The patient had about a 10-degree flexion contracture. Time-out was called and then the left knee was aspirated after sterile prep and drape with alcohol and Betadine.  Cloudy fluid was aspirated, sent for stat Gram stain, cell count, aerobic and anaerobic culture, and crystals.  Right and left legs were both prepped with DuraPrep solution, draped in a sterile manner.  Anterior, inferior, lateral; anterior, inferior, medial portal were established.  Diagnostic  arthroscopy performed on the right knee.  Debridement performed with a shaver.  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  Grade 2-3 chondromalacia in the medial compartment.  Grade 2-3 chondromalacia in the patellofemoral compartment.  Synovitis was present.  This was debrided with a shaver. Bleeding points were encountered and controlled by using ArthroCare wand.  All in all, end-stage knee arthritis was present.  9 L of irrigating solution placed through the knee.  Instruments were removed. Portals closed using 3-0 nylon.  Solution of Marcaine, morphine, clonidine injected into the right knee.  At this time, the left knee was also prepared.  Anterior, inferior, lateral; anterior, inferior, medial port was established.  Diagnostic arthroscopy was performed.  In general, the wear and tear was not quite as bad on the left knee as was on the right knee.  Grade 2 to 3 chondromalacia present in both the lateral compartment as well as the medial compartment, patellofemoral had grade 2 chondromalacia.  No meniscal tears on the medial or lateral side in the left knee.  There was a small meniscal tear medially on the right knee and there was basically no meniscus left laterally on the right knee.  9 L of irrigating solution performed after debridement with a shaver.  Bleeding points again controlled using ArthroCare wand.  Instruments were removed.  Portals closed using 3-0 nylon.  Solution of Marcaine, morphine, and clonidine injected into the left knee as well. Bulky dressing applied.  The patient tolerated the procedure well without immediate complication.  Transferred to the recovery room in stable condition.     Anderson Malta, M.D.     GSD/MEDQ  D:  03/29/2014  T:  03/30/2014  Job:  [id number]

## 2014-03-30 NOTE — Progress Notes (Addendum)
INFECTIOUS DISEASE PROGRESS NOTE  ID: [name] is a 73 y.o. male with  Active Problems:   Bacterial infection of knee joint  Subjective: Without complaints  Abtx:  Anti-infectives   Start     Dose/Rate Route Frequency Ordered Stop   03/30/14 0800  vancomycin (VANCOCIN) IVPB 1000 mg/200 mL premix     1,000 mg 200 mL/hr over 60 Minutes Intravenous Every 12 hours 03/29/14 1902     03/29/14 2000  cefTRIAXone (ROCEPHIN) 1 g in dextrose 5 % 50 mL IVPB     1 g 100 mL/hr over 30 Minutes Intravenous Every 24 hours 03/29/14 1930     03/29/14 1900  vancomycin (VANCOCIN) 1,750 mg in sodium chloride 0.9 % 500 mL IVPB    Comments:  Send to PACU   1,750 mg 250 mL/hr over 120 Minutes Intravenous  Once 03/29/14 1857 03/29/14 2257   03/29/14 1900  piperacillin-tazobactam (ZOSYN) IVPB 3.375 g  Status:  Discontinued     3.375 g 12.5 mL/hr over 240 Minutes Intravenous Every 8 hours 03/29/14 1857 03/29/14 1930      Medications:  Scheduled: . aspirin  325 mg Oral Daily  . cefTRIAXone (ROCEPHIN)  IV  1 g Intravenous Q24H  . colchicine  0.6 mg Oral Daily  . insulin aspart  0-9 Units Subcutaneous TID WC  . lisinopril  20 mg Oral Daily  . metFORMIN  2,000 mg Oral QHS  . predniSONE  20 mg Oral BID  . simvastatin  20 mg Oral Daily  . vancomycin  1,000 mg Intravenous Q12H    Objective: Vital signs in last 24 hours: Temp:  [97.9 F (36.6 C)-98.5 F (36.9 C)] 97.9 F (36.6 C) (05/13 1325) Pulse Rate:  [69-88] 88 (05/13 1325) Resp:  [10-18] 18 (05/13 1325) BP: (119-156)/(65-81) 119/67 mmHg (05/13 1325) SpO2:  [95 %-100 %] 99 % (05/13 1325) Weight:  [90.8 kg (200 lb 2.8 oz)] 90.8 kg (200 lb 2.8 oz) (05/12 1921)   General appearance: alert, cooperative and no distress Incision/Wound: both knees wrapped.  No LE edema  Lab Results No results found for this basename: WBC, HGB, HCT, PLATELETS, NA, K, CL, CO2, BUN, CREATININE, GLU,  in the last 72 hours Liver Panel No results found  for this basename: PROT, ALBUMIN, AST, ALT, ALKPHOS, BILITOT, BILIDIR, IBILI,  in the last 72 hours Sedimentation Rate No results found for this basename: ESRSEDRATE,  in the last 72 hours C-Reactive Protein No results found for this basename: CRP,  in the last 72 hours  Microbiology: Recent Results (from the past 240 hour(s))  ANAEROBIC CULTURE     Status: None   Collection Time    03/29/14  4:01 PM      Result Value Ref Range Status   Specimen Description SYNOVIAL FLUID KNEE LEFT   Final   Special Requests 10ML FLUID   Final   Gram Stain     Final   Value: ABUNDANT WBC PRESENT, PREDOMINANTLY PMN     NO ORGANISMS SEEN     Performed at [facility]   Culture     Final   Value: NO ANAEROBES ISOLATED; CULTURE IN PROGRESS FOR 5 DAYS     Performed at [facility]   Report Status PENDING   Incomplete  BODY FLUID CULTURE     Status: None   Collection Time    03/29/14  4:01 PM      Result Value Ref Range Status   Specimen Description SYNOVIAL  FLUID KNEE LEFT   Final   Special Requests 10ML FLUID   Final   Gram Stain     Final   Value: ABUNDANT WBC PRESENT, PREDOMINANTLY PMN     NO ORGANISMS SEEN     Performed at [facility]   Culture     Final   Value: NO GROWTH 1 DAY     Performed at [facility]   Report Status PENDING   Incomplete  ANAEROBIC CULTURE     Status: None   Collection Time    03/29/14  4:23 PM      Result Value Ref Range Status   Specimen Description FLUID SYNOVIAL RIGHT KNEE   Final   Special Requests SYRINGE   Final   Gram Stain     Final   Value: ABUNDANT WBC PRESENT, PREDOMINANTLY PMN     NO ORGANISMS SEEN     Performed at [facility]   Culture     Final   Value: NO ANAEROBES ISOLATED; CULTURE IN PROGRESS FOR 5 DAYS     Performed at [facility]   Report Status PENDING   Incomplete  BODY FLUID CULTURE     Status: None   Collection Time    03/29/14  4:23 PM      Result Value Ref Range Status   Specimen  Description FLUID SYNOVIAL RIGHT KNEE   Final   Special Requests SYRINGE   Final   Gram Stain     Final   Value: ABUNDANT WBC PRESENT, PREDOMINANTLY PMN     NO ORGANISMS SEEN     Performed at [facility]   Culture     Final   Value: NO GROWTH     Performed at [facility]   Report Status PENDING   Incomplete  ANAEROBIC CULTURE     Status: None   Collection Time    03/29/14  4:25 PM      Result Value Ref Range Status   Specimen Description FLUID SYNOVIAL RIGHT KNEE   Final   Special Requests ON SWAB   Final   Gram Stain     Final   Value: ABUNDANT WBC PRESENT, PREDOMINANTLY PMN     NO ORGANISMS SEEN     Performed at [facility]   Culture     Final   Value: NO ANAEROBES ISOLATED; CULTURE IN PROGRESS FOR 5 DAYS     Performed at [facility]   Report Status PENDING   Incomplete  BODY FLUID CULTURE     Status: None   Collection Time    03/29/14  4:25 PM      Result Value Ref Range Status   Specimen Description FLUID SYNOVIAL RIGHT KNEE   Final   Special Requests ON SWAB   Final   Gram Stain     Final   Value: ABUNDANT WBC PRESENT, PREDOMINANTLY PMN     NO ORGANISMS SEEN     Performed at [facility]   Culture     Final   Value: NO GROWTH 1 DAY     Performed at [facility]   Report Status PENDING   Incomplete    Studies/Results: Dg Chest 2 View  03/29/2014   CLINICAL DATA:  Preoperative study prior to arthroscopy  EXAM: CHEST  2 VIEW  COMPARISON:  None.  FINDINGS: The lungs are adequately inflated and clear. The cardiopericardial silhouette is normal in size. The pulmonary vascularity is not  engorged. The mediastinum is normal in width. There is no pleural effusion. The observed portions of the bony thorax appear normal.  IMPRESSION: There is no evidence of active cardiopulmonary disease.   Electronically Signed   By: David  Martinique   On: 03/29/2014 14:00     Assessment/Plan: Septic Arthritis R +/o L DM2  HTN  Total days  of antibiotics: 2 vanco/ceftriaxone  Would Place PIC Await Cx Plan for ~ 4-6 weeks anbx Spoke with pt and his daughter about home anbx.  Asked case mgr to see pt for d/c planning.          Campbell Riches Infectious Diseases (pager) [phone] [email or web address] 03/30/2014, 3:38 PM  LOS: 1 day   **Disclaimer: This note may have been dictated with voice recognition software. Similar sounding words can inadvertently be transcribed and this note may contain transcription errors which may not have been corrected upon publication of note.**

## 2014-03-30 NOTE — Anesthesia Postprocedure Evaluation (Signed)
  Anesthesia Post-op Note  Patient: [name]  Procedure(s) Performed: Procedure(s) with comments: RIGHT KNEE ARTHROSCOPY AND DEBRIDEMENT, LEFT KNEE ASPIRATION,  LEFT KNEE ARTHROSCOPY AND DEBRIDEMENT. (Bilateral) - RIGHT KNEE ARTHROSCOPY AND DEBRIDEMENT, LEFT KNEE ASPIRATION,  LEFT KNEE ARTHROSCOPY AND DEBRIDEMENT.  Patient discharged from PACU with no apparent anesthetic complications

## 2014-03-30 NOTE — Progress Notes (Signed)
Subjective: Pt stable - desires dc today   Objective: Vital signs in last 24 hours: Temp:  [97.8 F (36.6 C)-98.5 F (36.9 C)] 98 F (36.7 C) (05/13 0300) Pulse Rate:  [69-87] 84 (05/13 0300) Resp:  [10-18] 18 (05/13 0300) BP: (120-156)/(65-100) 132/68 mmHg (05/13 0300) SpO2:  [95 %-100 %] 98 % (05/13 0300) Weight:  [90.719 kg (200 lb)-90.8 kg (200 lb 2.8 oz)] 90.8 kg (200 lb 2.8 oz) (05/12 1921)  Intake/Output from previous day: 05/12 0701 - 05/13 0700 In: 1300 [I.V.:1300] Out: -  Intake/Output this shift:    Exam:  Intact pulses distally Compartment soft  Labs: No results found for this basename: HGB,  in the last 72 hours No results found for this basename: WBC, RBC, HCT, PLT,  in the last 72 hours No results found for this basename: NA, K, CL, CO2, BUN, CREATININE, GLUCOSE, CALCIUM,  in the last 72 hours No results found for this basename: LABPT, INR,  in the last 72 hours  Assessment/Plan: On iv vanc ceftrxiaone - thx for id input - not sure if he needs picc line _ - PT today   Meredith Pel 03/30/2014, 8:29 AM

## 2014-03-31 LAB — GLUCOSE, CAPILLARY
GLUCOSE-CAPILLARY: 143 mg/dL — AB (ref 70–99)
GLUCOSE-CAPILLARY: 210 mg/dL — AB (ref 70–99)
GLUCOSE-CAPILLARY: 195 mg/dL — AB (ref 70–99)
Glucose-Capillary: 129 mg/dL — ABNORMAL HIGH (ref 70–99)

## 2014-03-31 LAB — BASIC METABOLIC PANEL
BUN: 17 mg/dL (ref 6–23)
CHLORIDE: 99 meq/L (ref 96–112)
CO2: 26 meq/L (ref 19–32)
CREATININE: 0.8 mg/dL (ref 0.50–1.35)
Calcium: 9 mg/dL (ref 8.4–10.5)
GFR calc Af Amer: >90 mL/min (ref >90)
GFR calc non Af Amer: 87 mL/min — ABNORMAL LOW (ref >90)
GLUCOSE: 170 mg/dL — AB (ref 70–99)
Potassium: 4.9 mEq/L (ref 3.7–5.3)
Sodium: 136 mEq/L — ABNORMAL LOW (ref 137–147)

## 2014-03-31 MED ORDER — ASPIRIN 325 MG PO TABS
325.0000 mg | ORAL_TABLET | Freq: Two times a day (BID) | ORAL | Status: DC
  Administered 2014-03-31 – 2014-04-01 (×3): 325 mg via ORAL
  Filled 2014-03-31 (×4): qty 1

## 2014-03-31 NOTE — Progress Notes (Deleted)
MD at bedside with patient. Asked MD if neck collar can be removed for assessment of skin breakdown. Verbal orders OK to remove for hygiene and assessment. No evidence of skin breakdown upon removal. Neck area gently washed with soap and water. Neck collar intact.

## 2014-03-31 NOTE — Progress Notes (Signed)
Physical Therapy Treatment Patient Details Name: BRAXXTON STOUDT MRN: [id number] DOB: 1941-11-04 Today's Date: 03/31/2014    History of Present Illness Pt is a 73 y.o. male s/p Right and left knee arthroscopy  and debridement; with Lt knee aspiration     PT Comments    Pt with severe R knee limitation of extension due to 8/10 pain and muscle spasms.  Pt unable to take any steps in standing and is MAX A for sit > stand.  Does well with lateral scoot transfer.  Nurse aides educated on how to A pt back to bed.  Follow Up Recommendations  SNF;Supervision/Assistance - 24 hour     Equipment Recommendations  None recommended by PT    Recommendations for Other Services OT consult     Precautions / Restrictions Precautions Precautions: Fall    Mobility  Bed Mobility         Supine to sit: Min assist;HOB elevated     General bed mobility comments: A for support of R LE only.  Pt able to scoot self around.  Transfers Overall transfer level: Needs assistance Equipment used: Rolling walker (2 wheeled) Transfers: Sit to/from Stand Sit to Stand: From elevated surface;Mod assist;Max assist        Lateral/Scoot Transfers: Min assist;From elevated surface General transfer comment: sit to stand from elevated bed with MAX A to achieve standing and MOD A once standing with flexed trunk and little WB through R LE and with R knee flexion.  bed > recliner with arm rest down lateral scoot transfer with MIN A.  Ambulation/Gait                 Stairs            Wheelchair Mobility    Modified Rankin (Stroke Patients Only)       Balance             Standing balance-Leahy Scale: Zero                      Cognition Arousal/Alertness: Awake/alert Behavior During Therapy: WFL for tasks assessed/performed Overall Cognitive Status: Within Functional Limits for tasks assessed                      Exercises Other Exercises Other Exercises: Pt  educated on importance of quad sets and increasing R knee ext especially.    General Comments        Pertinent Vitals/Pain 8/10 R LE spasms.    Home Living                      Prior Function            PT Goals (current goals can now be found in the care plan section) Acute Rehab PT Goals Patient Stated Goal: to walk again  Progress towards PT goals: Progressing toward goals    Frequency  Min 3X/week    PT Plan Current plan remains appropriate    Co-evaluation             End of Session Equipment Utilized During Treatment: Gait belt Activity Tolerance: Patient limited by pain Patient left: in chair;with chair alarm set;with call bell/phone within reach;with family/visitor present     Time: 6213-0865 PT Time Calculation (min): 31 min  Charges:  $Therapeutic Activity: 23-37 mins                    G  Codes:      Galen Manila 03/31/2014, 12:43 PM

## 2014-03-31 NOTE — Progress Notes (Addendum)
INFECTIOUS DISEASE PROGRESS NOTE  ID: [name] is a 73 y.o. male with  Active Problems:   Bacterial infection of knee joint  Subjective: Having muscle cramps/spasms in thighs diarrhea  Abtx:  Anti-infectives   Start     Dose/Rate Route Frequency Ordered Stop   03/30/14 0800  vancomycin (VANCOCIN) IVPB 1000 mg/200 mL premix     1,000 mg 200 mL/hr over 60 Minutes Intravenous Every 12 hours 03/29/14 1902     03/29/14 2000  cefTRIAXone (ROCEPHIN) 1 g in dextrose 5 % 50 mL IVPB     1 g 100 mL/hr over 30 Minutes Intravenous Every 24 hours 03/29/14 1930     03/29/14 1900  vancomycin (VANCOCIN) 1,750 mg in sodium chloride 0.9 % 500 mL IVPB    Comments:  Send to PACU   1,750 mg 250 mL/hr over 120 Minutes Intravenous  Once 03/29/14 1857 03/29/14 2257   03/29/14 1900  piperacillin-tazobactam (ZOSYN) IVPB 3.375 g  Status:  Discontinued     3.375 g 12.5 mL/hr over 240 Minutes Intravenous Every 8 hours 03/29/14 1857 03/29/14 1930      Medications:  Scheduled: . aspirin  325 mg Oral BID  . cefTRIAXone (ROCEPHIN)  IV  1 g Intravenous Q24H  . colchicine  0.6 mg Oral Daily  . insulin aspart  0-9 Units Subcutaneous TID WC  . lisinopril  20 mg Oral Daily  . metFORMIN  2,000 mg Oral QHS  . predniSONE  20 mg Oral BID  . simvastatin  20 mg Oral Daily  . vancomycin  1,000 mg Intravenous Q12H    Objective: Vital signs in last 24 hours: Temp:  [98.1 F (36.7 C)-99.1 F (37.3 C)] 98.3 F (36.8 C) (05/14 1335) Pulse Rate:  [86-89] 87 (05/14 1335) Resp:  [16-17] 16 (05/14 1335) BP: (102-123)/(62-70) 112/66 mmHg (05/14 1335) SpO2:  [97 %-98 %] 98 % (05/14 1335)   General appearance: alert, cooperative and no distress Resp: clear to auscultation bilaterally Cardio: regular rate and rhythm GI: normal findings: bowel sounds normal and soft, non-tender and abnormal findings:  distended Extremities: knees wrapped.   Lab Results  Recent Labs  03/31/14 0550  NA 136*  K 4.9    CL 99  CO2 26  BUN 17  CREATININE 0.80   Liver Panel No results found for this basename: PROT, ALBUMIN, AST, ALT, ALKPHOS, BILITOT, BILIDIR, IBILI,  in the last 72 hours Sedimentation Rate No results found for this basename: ESRSEDRATE,  in the last 72 hours C-Reactive Protein No results found for this basename: CRP,  in the last 72 hours  Microbiology: Recent Results (from the past 240 hour(s))  ANAEROBIC CULTURE     Status: None   Collection Time    03/29/14  4:01 PM      Result Value Ref Range Status   Specimen Description SYNOVIAL FLUID KNEE LEFT   Final   Special Requests 10ML FLUID   Final   Gram Stain     Final   Value: ABUNDANT WBC PRESENT, PREDOMINANTLY PMN     NO ORGANISMS SEEN     Performed at [facility]   Culture     Final   Value: NO ANAEROBES ISOLATED; CULTURE IN PROGRESS FOR 5 DAYS     Performed at [facility]   Report Status PENDING   Incomplete  BODY FLUID CULTURE     Status: None   Collection Time    03/29/14  4:01 PM  Result Value Ref Range Status   Specimen Description SYNOVIAL FLUID KNEE LEFT   Final   Special Requests 10ML FLUID   Final   Gram Stain     Final   Value: ABUNDANT WBC PRESENT, PREDOMINANTLY PMN     NO ORGANISMS SEEN     Performed at [facility]   Culture     Final   Value: NO GROWTH 2 DAYS     Performed at [facility]   Report Status PENDING   Incomplete  ANAEROBIC CULTURE     Status: None   Collection Time    03/29/14  4:23 PM      Result Value Ref Range Status   Specimen Description FLUID SYNOVIAL RIGHT KNEE   Final   Special Requests SYRINGE   Final   Gram Stain     Final   Value: ABUNDANT WBC PRESENT, PREDOMINANTLY PMN     NO ORGANISMS SEEN     Performed at [facility]   Culture     Final   Value: NO ANAEROBES ISOLATED; CULTURE IN PROGRESS FOR 5 DAYS     Performed at [facility]   Report Status PENDING   Incomplete  BODY FLUID CULTURE     Status: None    Collection Time    03/29/14  4:23 PM      Result Value Ref Range Status   Specimen Description FLUID SYNOVIAL RIGHT KNEE   Final   Special Requests SYRINGE   Final   Gram Stain     Final   Value: ABUNDANT WBC PRESENT, PREDOMINANTLY PMN     NO ORGANISMS SEEN     Performed at [facility]   Culture     Final   Value: NO GROWTH 1 DAY     Performed at [facility]   Report Status PENDING   Incomplete  ANAEROBIC CULTURE     Status: None   Collection Time    03/29/14  4:25 PM      Result Value Ref Range Status   Specimen Description FLUID SYNOVIAL RIGHT KNEE   Final   Special Requests ON SWAB   Final   Gram Stain     Final   Value: ABUNDANT WBC PRESENT, PREDOMINANTLY PMN     NO ORGANISMS SEEN     Performed at [facility]   Culture     Final   Value: NO ANAEROBES ISOLATED; CULTURE IN PROGRESS FOR 5 DAYS     Performed at [facility]   Report Status PENDING   Incomplete  BODY FLUID CULTURE     Status: None   Collection Time    03/29/14  4:25 PM      Result Value Ref Range Status   Specimen Description FLUID SYNOVIAL RIGHT KNEE   Final   Special Requests ON SWAB   Final   Gram Stain     Final   Value: ABUNDANT WBC PRESENT, PREDOMINANTLY PMN     NO ORGANISMS SEEN     Performed at [facility]   Culture     Final   Value: NO GROWTH 2 DAYS     Performed at [facility]   Report Status PENDING   Incomplete    Studies/Results: No results found.   Assessment/Plan: Septic Arthritis R +/o L  DM2  HTN  Total days of antibiotics 3 vanco/ceftriaxone  Cx are NGTD.  Will check C diff Spoke with CM about  possible d/c.  He has PIC.  No chagne in anbx for now.  FSG [phone]         Campbell Riches Infect[facility] (pager) [phone] [email or web address] 03/31/2014, 4:06 PM  LOS: 2 days   **Disclaimer: This note may have been dictated with voice recognition software. Similar sounding words can inadvertently be transcribed  and this note may contain transcription errors which may not have been corrected upon publication of note.**

## 2014-03-31 NOTE — Progress Notes (Signed)
Orthopedic Tech Progress Note Patient Details:  [name] December 24, 1940 [id number] Patient has order for bilateral CPM application. CPM applied to RLE first with appropriate settings. Will be switched to left this afternoon. CPM Right Knee CPM Right Knee: On Right Knee Flexion (Degrees): 60 Right Knee Extension (Degrees): 0   [name] 03/31/2014, 8:47 AM

## 2014-03-31 NOTE — Progress Notes (Signed)
Subjective: Pt stable - right more than left knee pain - sat by end of bed yesterday   Objective: Vital signs in last 24 hours: Temp:  [97.9 F (36.6 C)-99.1 F (37.3 C)] 98.1 F (36.7 C) (05/14 0524) Pulse Rate:  [86-89] 86 (05/14 0524) Resp:  [17-18] 17 (05/14 0524) BP: (119-123)/(67-70) 121/70 mmHg (05/14 0524) SpO2:  [97 %-99 %] 97 % (05/14 0524)  Intake/Output from previous day: 05/13 0701 - 05/14 0700 In: 960 [P.O.:960] Out: 300 [Urine:300] Intake/Output this shift: Total I/O In: -  Out: 225 [Urine:225]  Exam:  Neurovascular intact Sensation intact distally Intact pulses distally Compartment soft  Labs: No results found for this basename: HGB,  in the last 72 hours No results found for this basename: WBC, RBC, HCT, PLT,  in the last 72 hours  Recent Labs  03/31/14 0550  NA 136*  K 4.9  CL 99  CO2 26  BUN 17  CREATININE 0.80  GLUCOSE 170*  CALCIUM 9.0   No results found for this basename: LABPT, INR,  in the last 72 hours  Assessment/Plan: Pt slow to mobilize - needs more PT - 4 - 6 weeks iv abx - will keep today pending PT recs for dc vs snf   Meredith Pel 03/31/2014, 7:59 AM

## 2014-04-01 ENCOUNTER — Encounter (HOSPITAL_COMMUNITY): Payer: Self-pay | Admitting: Orthopedic Surgery

## 2014-04-01 DIAGNOSIS — I82403 Acute embolism and thrombosis of unspecified deep veins of lower extremity, bilateral: Secondary | ICD-10-CM

## 2014-04-01 DIAGNOSIS — I82409 Acute embolism and thrombosis of unspecified deep veins of unspecified lower extremity: Secondary | ICD-10-CM

## 2014-04-01 DIAGNOSIS — E785 Hyperlipidemia, unspecified: Secondary | ICD-10-CM

## 2014-04-01 DIAGNOSIS — M79609 Pain in unspecified limb: Secondary | ICD-10-CM

## 2014-04-01 HISTORY — DX: Acute embolism and thrombosis of unspecified deep veins of lower extremity, bilateral: I82.403

## 2014-04-01 LAB — GLUCOSE, CAPILLARY
Glucose-Capillary: 153 mg/dL — ABNORMAL HIGH (ref 70–99)
Glucose-Capillary: 179 mg/dL — ABNORMAL HIGH (ref 70–99)
Glucose-Capillary: 198 mg/dL — ABNORMAL HIGH (ref 70–99)
Glucose-Capillary: 167 mg/dL — ABNORMAL HIGH (ref 70–99)

## 2014-04-01 LAB — VANCOMYCIN, TROUGH: Vancomycin Tr: 9 ug/mL — ABNORMAL LOW (ref 10.0–20.0)

## 2014-04-01 MED ORDER — APIXABAN 5 MG PO TABS
10.0000 mg | ORAL_TABLET | Freq: Two times a day (BID) | ORAL | Status: DC
  Administered 2014-04-01 – 2014-04-04 (×6): 10 mg via ORAL
  Filled 2014-04-01 (×7): qty 2

## 2014-04-01 MED ORDER — VANCOMYCIN HCL 10 G IV SOLR
1500.0000 mg | Freq: Two times a day (BID) | INTRAVENOUS | Status: DC
  Administered 2014-04-01 – 2014-04-04 (×7): 1500 mg via INTRAVENOUS
  Filled 2014-04-01 (×8): qty 1500

## 2014-04-01 MED ORDER — ENOXAPARIN SODIUM 100 MG/ML ~~LOC~~ SOLN
90.0000 mg | Freq: Two times a day (BID) | SUBCUTANEOUS | Status: DC
  Filled 2014-04-01 (×2): qty 1

## 2014-04-01 MED ORDER — APIXABAN 5 MG PO TABS: 5.0000 mg | ORAL_TABLET | Freq: Two times a day (BID) | ORAL | Status: DC

## 2014-04-01 NOTE — Progress Notes (Signed)
Clinical Social Work Department BRIEF PSYCHOSOCIAL ASSESSMENT 04/01/2014  Patient:  [name], [name]     Account Number:  [id number]     Admit date:  03/29/2014  Clinical Social Worker:  Adair Laundry  Date/Time:  04/01/2014 11:00 AM  Referred by:  Physician  Date Referred:  04/01/2014 Referred for  SNF Placement   Other Referral:   Interview type:  Patient Other interview type:   Spoke with pt and pt family at bedside    PSYCHOSOCIAL DATA Living Status:  WIFE Admitted from facility:   Level of care:   Primary support name:  Abhijot Straughter [phone] Primary support relationship to patient:  SPOUSE Degree of support available:   Pt has good support available    CURRENT CONCERNS Current Concerns  Post-Acute Placement   Other Concerns:    SOCIAL WORK ASSESSMENT / PLAN CSW made aware that pt will need SNF at dc. CSW visited pt room and pt aware of recommendation. Pt reported he has not been to a facility in the past but would like to go to facility in [place], [place] which is close to his home. CSW explained SNF referral process to both pt and pt family. Pt is agreeable to being faxed out to all [facility] facilities. Pt aware that plan is for pt to dc tomorrow and he will need to have facility chosen today.   Assessment/plan status:  Psychosocial Support/Ongoing Assessment of Needs Other assessment/ plan:   Information/referral to community resources:   SNF list to be provided with bed offers    PATIENT'S/FAMILY'S RESPONSE TO PLAN OF CARE: Pt is agreeable to dc to SNF       [place], [place]

## 2014-04-01 NOTE — Progress Notes (Signed)
ANTIBIOTIC CONSULT NOTE - Follow-up  Pharmacy Consult for vancomycin  Indication: bilateral septic arthritis  Patient Measurements: Height: 5\' 8"  (172.7 cm) Weight: 200 lb 2.8 oz (90.8 kg) IBW/kg (Calculated) : 68.4  Vital Signs: Temp: 98.4 F (36.9 C) (05/15 0617) Temp src: Oral (05/15 0617) BP: 111/66 mmHg (05/15 0617) Pulse Rate: 83 (05/15 0617) Intake/Output from previous day: 05/14 0701 - 05/15 0700 In: 888 [P.O.:888] Out: 575 [Urine:575] Intake/Output from this shift:    Labs:  Recent Labs  03/31/14 0550  CREATININE 0.80   Estimated Creatinine Clearance: 91.4 ml/min (by C-G formula based on Cr of 0.8).  Recent Labs  04/01/14 0708  [place] 9.0*     Microbiology: Recent Results (from the past 720 hour(s))  ANAEROBIC CULTURE     Status: None   Collection Time    03/29/14  4:01 PM      Result Value Ref Range Status   Specimen Description SYNOVIAL FLUID KNEE LEFT   Final   Special Requests 10ML FLUID   Final   Gram Stain     Final   Value: ABUNDANT WBC PRESENT, PREDOMINANTLY PMN     NO ORGANISMS SEEN     Performed at [facility]   Culture     Final   Value: NO ANAEROBES ISOLATED; CULTURE IN PROGRESS FOR 5 DAYS     Performed at [facility]   Report Status PENDING   Incomplete  BODY FLUID CULTURE     Status: None   Collection Time    03/29/14  4:01 PM      Result Value Ref Range Status   Specimen Description SYNOVIAL FLUID KNEE LEFT   Final   Special Requests 10ML FLUID   Final   Gram Stain     Final   Value: ABUNDANT WBC PRESENT, PREDOMINANTLY PMN     NO ORGANISMS SEEN     Performed at [facility]   Culture     Final   Value: NO GROWTH 2 DAYS     Performed at [facility]   Report Status PENDING   Incomplete  ANAEROBIC CULTURE     Status: None   Collection Time    03/29/14  4:23 PM      Result Value Ref Range Status   Specimen Description FLUID SYNOVIAL RIGHT KNEE   Final   Special Requests SYRINGE   Final    Gram Stain     Final   Value: ABUNDANT WBC PRESENT, PREDOMINANTLY PMN     NO ORGANISMS SEEN     Performed at [facility]   Culture     Final   Value: NO ANAEROBES ISOLATED; CULTURE IN PROGRESS FOR 5 DAYS     Performed at [facility]   Report Status PENDING   Incomplete  BODY FLUID CULTURE     Status: None   Collection Time    03/29/14  4:23 PM      Result Value Ref Range Status   Specimen Description FLUID SYNOVIAL RIGHT KNEE   Final   Special Requests SYRINGE   Final   Gram Stain     Final   Value: ABUNDANT WBC PRESENT, PREDOMINANTLY PMN     NO ORGANISMS SEEN     Performed at [facility]   Culture     Final   Value: NO GROWTH 1 DAY     Performed at [facility]   Report Status PENDING   Incomplete  ANAEROBIC  CULTURE     Status: None   Collection Time    03/29/14  4:25 PM      Result Value Ref Range Status   Specimen Description FLUID SYNOVIAL RIGHT KNEE   Final   Special Requests ON SWAB   Final   Gram Stain     Final   Value: ABUNDANT WBC PRESENT, PREDOMINANTLY PMN     NO ORGANISMS SEEN     Performed at [facility]   Culture     Final   Value: NO ANAEROBES ISOLATED; CULTURE IN PROGRESS FOR 5 DAYS     Performed at [facility]   Report Status PENDING   Incomplete  BODY FLUID CULTURE     Status: None   Collection Time    03/29/14  4:25 PM      Result Value Ref Range Status   Specimen Description FLUID SYNOVIAL RIGHT KNEE   Final   Special Requests ON SWAB   Final   Gram Stain     Final   Value: ABUNDANT WBC PRESENT, PREDOMINANTLY PMN     NO ORGANISMS SEEN     Performed at [facility]   Culture     Final   Value: NO GROWTH 2 DAYS     Performed at [facility]   Report Status PENDING   Incomplete    Assessment: 73 yo man s/p right and left knee debridement continues on D#4 of vancomycin + ceftriaxone for bilateral septic arthritis. A trough was checked today and was low at 9. Renal function is  stable as of yesterday and pt remains afebrile. Will need 4-6 weeks of antibiotics per ID.   Vanc 5/12>> CTX 5/12>>  5/12 R Synovial fluid - NGTD 5/12 L Synovial fluid - NGTD  5/15 VT = 9 on 1g Q12H  Goal of Therapy:  Vancomycin trough level 10-15 mcg/ml  Plan:  1. Increase vancomycin to 1500mg  IV Q12H 2. F/u renal fxn, C&S, clinical status and trough at SS 3. If discharged, would recommend checking a trough in 2-3 days to evaluate new dose  Salome Arnt, PharmD, BCPS Pager # [phone] 04/01/2014 8:15 AM

## 2014-04-01 NOTE — Consult Note (Signed)
[facility] Medical Consultation  CAELEB BATALLA Mccormick:[id number] DOB: 02/27/1941 DOA: 03/29/2014 PCP: Tawanna Solo, MD   Requesting physician: Alphonzo Severance Date of consultation: 04/01/14 Reason for consultation: Acute bilateral DVT   Impression/Recommendations Active Problems:   Essential hypertension   Diabetes mellitus type II, controlled   Bacterial infection of knee joint   DVT of lower extremity, bilateral    Acute DVT -Acute DVT, bilateral of lower extremities. -Patient started on Lovenox, agree with first dose, will switch to one of Xa inhibitors Xarelto or Eliquis. -Patient educated about blood thinners adverse effects including minor or major bleeding and bruisability.  -patient denies any shortness of breath or chest pain, very minimal suspicion for PE.  Septic arthritis -Per Dr. Marlou Sa, status post washing, vancomycin and Rocephin for total of 4 weeks per Dr. Johnnye Sima. -I will discontinue the prednisone, this is started by ED physician and 03/24/2014 for knee swelling.  Type 2 diabetes mellitus -Carb modified diet, SSI. -Check hemoglobin A1c.  HTN -Continue home medications  I will followup again tomorrow. Please contact me if I can be of assistance in the meanwhile. Thank you for this consultation.  Chief Complaint: Bilateral Lower extremity spasm   HPI:  [name] is a 73 year old African American male with past medical history of hypertension and diabetes mellitus. Patient has bilateral knee pain, admitted to the hospital and aspiration of the knee showed septic arthritis. Patient undergone bilateral arthroscopic washing of the joints. Antibiotics per ID. Patient developed bilateral leg "spasm", and bilateral Doppler ultrasound was positive for acute DVT. Patient started on Lovenox.  Review of Systems:  Constitutional: negative for anorexia, fevers and sweats Eyes: negative for irritation, redness and visual disturbance Ears, nose, mouth, throat, and  face: negative for earaches, epistaxis, nasal congestion and sore throat Respiratory: negative for cough, dyspnea on exertion, sputum and wheezing Cardiovascular: negative for chest pain, dyspnea, lower extremity edema, orthopnea, palpitations and syncope Gastrointestinal: negative for abdominal pain, constipation, diarrhea, melena, nausea and vomiting Genitourinary:negative for dysuria, frequency and hematuria Hematologic/lymphatic: negative for bleeding, easy bruising and lymphadenopathy Musculoskeletal:negative for arthralgias, muscle weakness and stiff joints Neurological: negative for coordination problems, gait problems, headaches and weakness Endocrine: negative for diabetic symptoms including polydipsia, polyuria and weight loss Allergic/Immunologic: negative for anaphylaxis, hay fever and urticaria  Past Medical History  Diagnosis Date  . Attention deficit disorder   . Diabetes mellitus without complication   . Hyperlipidemia   . Hypertension   . Blind left eye    Past Surgical History  Procedure Laterality Date  . Lipoma removal, right upper back    . Eye surgery Left     x3  . Tonsillectomy    . Knee arthroscopy Bilateral 03/29/2014    Procedure: RIGHT KNEE ARTHROSCOPY AND DEBRIDEMENT, LEFT KNEE ASPIRATION,  LEFT KNEE ARTHROSCOPY AND DEBRIDEMENT.;  Surgeon: Meredith Pel, MD;  Location: [facility];  Service: Orthopedics;  Laterality: Bilateral;  RIGHT KNEE ARTHROSCOPY AND DEBRIDEMENT, LEFT KNEE ASPIRATION,  LEFT KNEE ARTHROSCOPY AND DEBRIDEMENT.   Social History:  reports that he has quit smoking. He does not have any smokeless tobacco history on file. He reports that he does not drink alcohol or use illicit drugs.  Not on File Family History  Problem Relation Age of Onset  . Heart attack Father   . Heart attack Sister   . Heart attack Brother     Prior to Admission medications   Medication Sig Start Date End Date Taking? Authorizing Provider  aspirin 81 MG tablet  Take 81 mg by mouth daily.   Yes Historical Provider, MD  colchicine 0.6 MG tablet Take 1 tablet (0.6 mg total) by mouth daily. 03/20/14  Yes [name], MD  ibuprofen (ADVIL,MOTRIN) 200 MG tablet Take 400-800 mg by mouth 3 (three) times daily as needed for mild pain.   Yes Historical Provider, MD  lisinopril (PRINIVIL,ZESTRIL) 20 MG tablet Take 20 mg by mouth daily.   Yes Historical Provider, MD  metFORMIN (GLUCOPHAGE-XR) 500 MG 24 hr tablet Take 2,000 mg by mouth at bedtime.    Yes Historical Provider, MD  oxyCODONE-acetaminophen (PERCOCET/ROXICET) 5-325 MG per tablet Take 2 tablets by mouth every 4 (four) hours as needed for severe pain. 03/20/14  Yes [name], MD  predniSONE (DELTASONE) 20 MG tablet Take 1 tablet (20 mg total) by mouth 2 (two) times daily. 03/24/14  Yes Richarda Blade, MD  simvastatin (ZOCOR) 20 MG tablet Take 20 mg by mouth daily.   Yes Historical Provider, MD   Physical Exam: Blood pressure 114/73, pulse 91, temperature 97.5 F (36.4 C), temperature source Oral, resp. rate 15, height 5\' 8"  (1.727 m), weight 90.8 kg (200 lb 2.8 oz), SpO2 98.00%. Filed Vitals:   04/01/14 1354  BP: 114/73  Pulse: 91  Temp: 97.5 F (36.4 C)  Resp: 15   General appearance: alert, cooperative and no distress  Head: Normocephalic, without obvious abnormality, atraumatic  Eyes: conjunctivae/corneas clear. PERRL, EOM's intact. Fundi benign.  Nose: Nares normal. Septum midline. Mucosa normal. No drainage or sinus tenderness.  Throat: lips, mucosa, and tongue normal; teeth and gums normal  Neck: Supple, no masses, no cervical lymphadenopathy, no JVD appreciated, no meningeal signs Resp: clear to auscultation bilaterally  Chest wall: no tenderness  Cardio: regular rate and rhythm, S1, S2 normal, no murmur, click, rub or gallop  GI: soft, non-tender; bowel sounds normal; no masses, no organomegaly  Extremities: extremities normal, atraumatic, no cyanosis or edema  Skin: Skin color,  texture, turgor normal. No rashes or lesions  Neurologic: Alert and oriented X 3, normal strength and tone. Normal symmetric reflexes. Normal coordination and gait  Labs on Admission:  Basic Metabolic Panel:  Recent Labs Lab 03/31/14 0550  NA 136*  K 4.9  CL 99  CO2 26  GLUCOSE 170*  BUN 17  CREATININE 0.80  CALCIUM 9.0   Liver Function Tests: No results found for this basename: AST, ALT, ALKPHOS, BILITOT, PROT, ALBUMIN,  in the last 168 hours No results found for this basename: LIPASE, AMYLASE,  in the last 168 hours No results found for this basename: AMMONIA,  in the last 168 hours CBC: No results found for this basename: WBC, NEUTROABS, HGB, HCT, MCV, PLT,  in the last 168 hours Cardiac Enzymes: No results found for this basename: CKTOTAL, CKMB, CKMBINDEX, TROPONINI,  in the last 168 hours BNP: No components found with this basename: POCBNP,  CBG:  Recent Labs Lab 03/31/14 1157 03/31/14 1652 03/31/14 2126 04/01/14 0749 04/01/14 1146  GLUCAP 143* 210* 195* 153* 179*    Radiological Exams on Admission: No results found.   Time spent: 70 minutes  Verlee Monte [facility] Pager [phone]  If 7PM-7AM, please contact night-coverage [email or web address] Password [facility] 04/01/2014, 4:23 PM

## 2014-04-01 NOTE — Care Management Note (Signed)
    Page 1 of 1   04/04/2014     6:05:16 PM CARE MANAGEMENT NOTE 04/04/2014  Patient:  [name], [name]   Account Number:  [id number]  Date Initiated:  04/01/2014  Documentation initiated by:  Tomi Bamberger  Subjective/Objective Assessment:   dx r knee infection  admit- lives with spouse.     Action/Plan:   pt eval-rec snf.   Anticipated DC Date:  04/05/2014   Anticipated DC Plan:  SKILLED NURSING FACILITY  In-house referral  Clinical Social Worker      DC Planning Services  CM consult      Choice offered to / List presented to:             Status of service:  Completed, signed off Medicare Important Message given?   (If response is "NO", the following Medicare IM given date fields will be blank) Date Medicare IM given:   Date Additional Medicare IM given:  04/04/2014  Discharge Disposition:  Door  Per UR Regulation:  Reviewed for med. necessity/level of care/duration of stay  If discussed at [place] of Stay Meetings, dates discussed:    Comments:  04/04/14 [place], BSN [phone] patient is for dc today, going to snf, NCM awaiting benefits check on eliquis 10mg  bid.  NCM gave patient 30 day free trial savings card to spouse.  Patient's insurance does not cover eliquis,  NCM informed MD, patient is switched to xarelto.  Xarelto is covered by insurance and prior auth received.  NCM gave patient 12 month free savings card.

## 2014-04-01 NOTE — Progress Notes (Addendum)
ANTICOAGULATION CONSULT NOTE - Initial Consult  Pharmacy Consult for lovenox Indication: DVT  Not on File  Patient Measurements: Height: 5\' 8"  (172.7 cm) Weight: 200 lb 2.8 oz (90.8 kg) IBW/kg (Calculated) : 68.4  Vital Signs: Temp: 97.5 F (36.4 C) (05/15 1354) Temp src: Oral (05/15 1354) BP: 114/73 mmHg (05/15 1354) Pulse Rate: 91 (05/15 1354)  Labs:  Recent Labs  03/31/14 0550  CREATININE 0.80    Estimated Creatinine Clearance: 91.4 ml/min (by C-G formula based on Cr of 0.8).  Assessment: [name] initially presented for R knee pain, now with acute bilateral DVT. To start lovenox for treatment. Last H/H was WNL and plts were a bit high. Pt has adequate renal fxn.   Goal of Therapy:  Anti-Xa level 0.6-1 units/ml 4hrs after LMWH dose given Monitor platelets by anticoagulation protocol: Yes   Plan:  1. Lovenox 90mg  SQ Q12H 2. CBC Q72H 3. F/u initiation of oral anticoagulation  Rande Lawman Rumbarger 04/01/2014,3:26 PM  PM Addendum Lovenox dose not given yet Change to Apixaban po 10mg  BID x7 days then 5mg  BID there after  Bonnita Nasuti Pharm.D. CPP, BCPS Clinical Pharmacist [phone] 04/01/2014 5:29 PM

## 2014-04-01 NOTE — Progress Notes (Addendum)
Clinical Social Work Department CLINICAL SOCIAL WORK PLACEMENT NOTE 04/01/2014  Patient:  KC, SUMMERSON  Account Number:  [id number] [place] date:  03/29/2014  Clinical Social Worker:  Berton Mount, Latanya Presser  Date/time:  04/01/2014 01:00 PM  Clinical Social Work is seeking post-discharge placement for this patient at the following level of care:   [place]   (*CSW will update this form in Epic as items are completed)   04/01/2014  Patient/family provided with [place] Department of Clinical Social Work's list of facilities offering this level of care within the geographic area requested by the patient (or if unable, by the patient's family).  04/01/2014  Patient/family informed of their freedom to choose among providers that offer the needed level of care, that participate in Medicare, Medicaid or managed care program needed by the patient, have an available bed and are willing to accept the patient.  04/01/2014  Patient/family informed of [facility]' ownership interest in [facility], as well as of the fact that they are under no obligation to receive care at this facility.  PASARR submitted to EDS on 04/01/2014 PASARR number received from EDS on 04/01/2014  FL2 transmitted to all facilities in geographic area requested by pt/family on  04/01/2014 FL2 transmitted to all facilities within larger geographic area on   Patient informed that his/her managed care company has contracts with or will negotiate with  certain facilities, including the following:     Patient/family informed of bed offers received:  04/01/2014 Patient chooses bed at  Physician recommends and patient chooses bed at    Patient to be transferred to  on   Patient to be transferred to facility by   The following physician request were entered in Epic:   Additional Comments:   [place], [place]

## 2014-04-01 NOTE — Progress Notes (Signed)
INFECTIOUS DISEASE PROGRESS NOTE  ID: [name] is a 73 y.o. male with  Active Problems:   Bacterial infection of knee joint  Subjective: No BM in 2 days.   Abtx:  Anti-infectives   Start     Dose/Rate Route Frequency Ordered Stop   04/01/14 0900  vancomycin (VANCOCIN) 1,500 mg in sodium chloride 0.9 % 500 mL IVPB     1,500 mg 250 mL/hr over 120 Minutes Intravenous Every 12 hours 04/01/14 0811     03/30/14 0800  vancomycin (VANCOCIN) IVPB 1000 mg/200 mL premix  Status:  Discontinued     1,000 mg 200 mL/hr over 60 Minutes Intravenous Every 12 hours 03/29/14 1902 04/01/14 0808   03/29/14 2000  cefTRIAXone (ROCEPHIN) 1 g in dextrose 5 % 50 mL IVPB     1 g 100 mL/hr over 30 Minutes Intravenous Every 24 hours 03/29/14 1930     03/29/14 1900  vancomycin (VANCOCIN) 1,750 mg in sodium chloride 0.9 % 500 mL IVPB    Comments:  Send to PACU   1,750 mg 250 mL/hr over 120 Minutes Intravenous  Once 03/29/14 1857 03/29/14 2257   03/29/14 1900  piperacillin-tazobactam (ZOSYN) IVPB 3.375 g  Status:  Discontinued     3.375 g 12.5 mL/hr over 240 Minutes Intravenous Every 8 hours 03/29/14 1857 03/29/14 1930      Medications:  Scheduled: . aspirin  325 mg Oral BID  . cefTRIAXone (ROCEPHIN)  IV  1 g Intravenous Q24H  . colchicine  0.6 mg Oral Daily  . insulin aspart  0-9 Units Subcutaneous TID WC  . lisinopril  20 mg Oral Daily  . metFORMIN  2,000 mg Oral QHS  . predniSONE  20 mg Oral BID  . simvastatin  20 mg Oral Daily  . vancomycin  1,500 mg Intravenous Q12H    Objective: Vital signs in last 24 hours: Temp:  [97.5 F (36.4 C)-99 F (37.2 C)] 97.5 F (36.4 C) (05/15 1354) Pulse Rate:  [83-91] 91 (05/15 1354) Resp:  [15-16] 15 (05/15 1354) BP: (102-120)/(60-73) 114/73 mmHg (05/15 1354) SpO2:  [93 %-98 %] 98 % (05/15 1354)   General appearance: alert, cooperative and no distress Extremities: knees are warm. mild increased swelling R vs L.   Lab Results  Recent Labs  03/31/14 0550  NA 136*  K 4.9  CL 99  CO2 26  BUN 17  CREATININE 0.80   Liver Panel No results found for this basename: PROT, ALBUMIN, AST, ALT, ALKPHOS, BILITOT, BILIDIR, IBILI,  in the last 72 hours Sedimentation Rate No results found for this basename: ESRSEDRATE,  in the last 72 hours C-Reactive Protein No results found for this basename: CRP,  in the last 72 hours  Microbiology: Recent Results (from the past 240 hour(s))  ANAEROBIC CULTURE     Status: None   Collection Time    03/29/14  4:01 PM      Result Value Ref Range Status   Specimen Description SYNOVIAL FLUID KNEE LEFT   Final   Special Requests 10ML FLUID   Final   Gram Stain     Final   Value: ABUNDANT WBC PRESENT, PREDOMINANTLY PMN     NO ORGANISMS SEEN     Performed at [facility]   Culture     Final   Value: NO ANAEROBES ISOLATED; CULTURE IN PROGRESS FOR 5 DAYS     Performed at [facility]   Report Status PENDING   Incomplete  BODY FLUID  CULTURE     Status: None   Collection Time    03/29/14  4:01 PM      Result Value Ref Range Status   Specimen Description SYNOVIAL FLUID KNEE LEFT   Final   Special Requests 10ML FLUID   Final   Gram Stain     Final   Value: ABUNDANT WBC PRESENT, PREDOMINANTLY PMN     NO ORGANISMS SEEN     Performed at [facility]   Culture     Final   Value: NO GROWTH 3 DAYS     Performed at [facility]   Report Status PENDING   Incomplete  ANAEROBIC CULTURE     Status: None   Collection Time    03/29/14  4:23 PM      Result Value Ref Range Status   Specimen Description FLUID SYNOVIAL RIGHT KNEE   Final   Special Requests SYRINGE   Final   Gram Stain     Final   Value: ABUNDANT WBC PRESENT, PREDOMINANTLY PMN     NO ORGANISMS SEEN     Performed at [facility]   Culture     Final   Value: NO ANAEROBES ISOLATED; CULTURE IN PROGRESS FOR 5 DAYS     Performed at [facility]   Report Status PENDING   Incomplete  BODY FLUID  CULTURE     Status: None   Collection Time    03/29/14  4:23 PM      Result Value Ref Range Status   Specimen Description FLUID SYNOVIAL RIGHT KNEE   Final   Special Requests SYRINGE   Final   Gram Stain     Final   Value: ABUNDANT WBC PRESENT, PREDOMINANTLY PMN     NO ORGANISMS SEEN     Performed at [facility]   Culture     Final   Value: NO GROWTH 2 DAYS     Performed at [facility]   Report Status PENDING   Incomplete  ANAEROBIC CULTURE     Status: None   Collection Time    03/29/14  4:25 PM      Result Value Ref Range Status   Specimen Description FLUID SYNOVIAL RIGHT KNEE   Final   Special Requests ON SWAB   Final   Gram Stain     Final   Value: ABUNDANT WBC PRESENT, PREDOMINANTLY PMN     NO ORGANISMS SEEN     Performed at [facility]   Culture     Final   Value: NO ANAEROBES ISOLATED; CULTURE IN PROGRESS FOR 5 DAYS     Performed at [facility]   Report Status PENDING   Incomplete  BODY FLUID CULTURE     Status: None   Collection Time    03/29/14  4:25 PM      Result Value Ref Range Status   Specimen Description FLUID SYNOVIAL RIGHT KNEE   Final   Special Requests ON SWAB   Final   Gram Stain     Final   Value: ABUNDANT WBC PRESENT, PREDOMINANTLY PMN     NO ORGANISMS SEEN     Performed at [facility]   Culture     Final   Value: NO GROWTH 3 DAYS     Performed at [facility]   Report Status PENDING   Incomplete    Studies/Results: No results found.   Assessment/Plan: Septic Arthritis R +/o L  DM2  HTN Loose bm- resolved.   Total days of antibiotics 4 vanco/ceftriaxone  His surgical cx are ngtd.  Would plan to continue his anbx for 6 weeks F/u in ID in 1 month Await his C diff, I doubt this will be positive.  Dr [name] is available if questions over the weekend.          [name] Infectious Diseases (pager) [phone] [email or web address] 04/01/2014, 2:48 PM  LOS: 3 days    **Disclaimer: This note may have been dictated with voice recognition software. Similar sounding words can inadvertently be transcribed and this note may contain transcription errors which may not have been corrected upon publication of note.**

## 2014-04-01 NOTE — Progress Notes (Signed)
Orthopedic Tech Progress Note Patient Details:  [name] [birth year] [id number]  Patient ID: [name], male   DOB: 21-Nov-1940, 73 y.o.   MRN: [id number] Placed pt's rle in cpm @0 -60 DEGREES @1710   Ranbir Chew 04/01/2014, 7:18 PM

## 2014-04-01 NOTE — Progress Notes (Signed)
Subjective: Pt stable - still having r more than left leg knee pain also spasms - not very mobile   Objective: Vital signs in last 24 hours: Temp:  [98.3 F (36.8 C)-99 F (37.2 C)] 98.4 F (36.9 C) (05/15 0617) Pulse Rate:  [83-90] 83 (05/15 0617) Resp:  [15-16] 15 (05/15 0617) BP: (102-120)/(62-67) 111/66 mmHg (05/15 0617) SpO2:  [93 %-98 %] 97 % (05/15 0617)  Intake/Output from previous day: 05/14 0701 - 05/15 0700 In: 888 [P.O.:888] Out: 575 [Urine:575] Intake/Output this shift:    Exam:  Intact pulses distally Dorsiflexion/Plantar flexion intact No cellulitis present  Labs: No results found for this basename: HGB,  in the last 72 hours No results found for this basename: WBC, RBC, HCT, PLT,  in the last 72 hours  Recent Labs  03/31/14 0550  NA 136*  K 4.9  CL 99  CO2 26  BUN 17  CREATININE 0.80  GLUCOSE 170*  CALCIUM 9.0   No results found for this basename: LABPT, INR,  in the last 72 hours  Assessment/Plan: c diff pending - having spasms in right leg - need to check for dvt - on asa - will need snf for 1 - 2 weeks of rehab   Meredith Pel 04/01/2014, 7:25 AM

## 2014-04-01 NOTE — Progress Notes (Signed)
Orthopedic Tech Progress Note Patient Details:  [name] 12/06/1940 [id number] CPM appled to LLE. To be switched to Right at 630pm CPM Left Knee CPM Left Knee: On Left Knee Flexion (Degrees): 60 Left Knee Extension (Degrees): 0 CPM Right Knee CPM Right Knee: On Right Knee Flexion (Degrees): 60 Right Knee Extension (Degrees): 0   [name] 04/01/2014, 3:27 PM

## 2014-04-01 NOTE — Progress Notes (Addendum)
*  Preliminary Results* Bilateral lower extremity venous duplex completed. Bilateral lower extremities are positive for deep vein thrombosis involving bilateral peroneal veins. There is no evidence of Baker's cyst bilaterally.  04/01/2014  Maudry Mayhew, RVT, RDCS, RDMS

## 2014-04-02 DIAGNOSIS — R079 Chest pain, unspecified: Secondary | ICD-10-CM

## 2014-04-02 LAB — CBC
HEMATOCRIT: 31.2 % — AB (ref 39.0–52.0)
Hemoglobin: 10.4 g/dL — ABNORMAL LOW (ref 13.0–17.0)
MCH: 28.9 pg (ref 26.0–34.0)
MCHC: 33.3 g/dL (ref 30.0–36.0)
MCV: 86.7 fL (ref 78.0–100.0)
Platelets: 714 10*3/uL — ABNORMAL HIGH (ref 150–400)
RBC: 3.6 MIL/uL — ABNORMAL LOW (ref 4.22–5.81)
RDW: 13.3 % (ref 11.5–15.5)
WBC: 11.1 10*3/uL — ABNORMAL HIGH (ref 4.0–10.5)

## 2014-04-02 LAB — BASIC METABOLIC PANEL
BUN: 17 mg/dL (ref 6–23)
CO2: 25 mEq/L (ref 19–32)
CREATININE: 0.77 mg/dL (ref 0.50–1.35)
Calcium: 8.7 mg/dL (ref 8.4–10.5)
Chloride: 96 mEq/L (ref 96–112)
GFR, EST NON AFRICAN AMERICAN: 89 mL/min — AB (ref >90)
Glucose, Bld: 185 mg/dL — ABNORMAL HIGH (ref 70–99)
Potassium: 4.4 mEq/L (ref 3.7–5.3)
Sodium: 133 mEq/L — ABNORMAL LOW (ref 137–147)

## 2014-04-02 LAB — URINALYSIS, ROUTINE W REFLEX MICROSCOPIC
Bilirubin Urine: NEGATIVE
GLUCOSE, UA: NEGATIVE mg/dL
Hgb urine dipstick: NEGATIVE
Ketones, ur: NEGATIVE mg/dL
LEUKOCYTES UA: NEGATIVE
NITRITE: NEGATIVE
PROTEIN: NEGATIVE mg/dL
Specific Gravity, Urine: 1.019 (ref 1.005–1.030)
Urobilinogen, UA: 0.2 mg/dL (ref 0.0–1.0)
pH: 6 (ref 5.0–8.0)

## 2014-04-02 LAB — BODY FLUID CULTURE
CULTURE: NO GROWTH
CULTURE: NO GROWTH

## 2014-04-02 LAB — GLUCOSE, CAPILLARY
GLUCOSE-CAPILLARY: 167 mg/dL — AB (ref 70–99)
Glucose-Capillary: 148 mg/dL — ABNORMAL HIGH (ref 70–99)
Glucose-Capillary: 135 mg/dL — ABNORMAL HIGH (ref 70–99)
Glucose-Capillary: 158 mg/dL — ABNORMAL HIGH (ref 70–99)

## 2014-04-02 NOTE — Progress Notes (Signed)
Agree with SPT.    Ignacio Lowder, PT [phone]  

## 2014-04-02 NOTE — Discharge Instructions (Signed)
Information on my medicine - ELIQUIS (apixaban)  This medication education was reviewed with me or my healthcare representative as part of my discharge preparation.    Why was Eliquis prescribed for you? Eliquis was prescribed to treat blood clots that may have been found in the veins of your legs (deep vein thrombosis) or in your lungs (pulmonary embolism) and to reduce the risk of them occurring again.  What do You need to know about Eliquis ? The starting dose is 10 mg (two 5 mg tablets) taken TWICE daily for the FIRST SEVEN (7) DAYS, then on (enter date)  04/09/14  the dose is reduced to ONE 5 mg tablet taken TWICE daily.  Eliquis may be taken with or without food.   Try to take the dose about the same time in the morning and in the evening. If you have difficulty swallowing the tablet whole please discuss with your pharmacist how to take the medication safely.  Take Eliquis exactly as prescribed and DO NOT stop taking Eliquis without talking to the doctor who prescribed the medication.  Stopping may increase your risk of developing a new blood clot.  Refill your prescription before you run out.  After discharge, you should have regular check-up appointments with your healthcare provider that is prescribing your Eliquis.    What do you do if you miss a dose? If a dose of ELIQUIS is not taken at the scheduled time, take it as soon as possible on the same day and twice-daily administration should be resumed. The dose should not be doubled to make up for a missed dose.  Important Safety Information A possible side effect of Eliquis is bleeding. You should call your healthcare provider right away if you experience any of the following:   Bleeding from an injury or your nose that does not stop.   Unusual colored urine (red or dark brown) or unusual colored stools (red or black).   Unusual bruising for unknown reasons.   A serious fall or if you hit your head (even if there is no  bleeding).  Some medicines may interact with Eliquis and might increase your risk of bleeding or clotting while on Eliquis. To help avoid this, consult your healthcare provider or pharmacist prior to using any new prescription or non-prescription medications, including herbals, vitamins, non-steroidal anti-inflammatory drugs (NSAIDs) and supplements.  This website has more information on Eliquis (apixaban): [email or web address].

## 2014-04-02 NOTE — Progress Notes (Signed)
[facility] PROGRESS NOTE   [name] FMB:[id number] DOB: Apr 07, 1941 DOA: 03/29/2014 PCP: Tawanna Solo, MD  HPI/Subjective: Seen with wife at bedside. Denies any fever or chills. Still complaining about spasm and lower extremities.  Assessment/Plan: Active Problems:   Essential hypertension   Diabetes mellitus type II, controlled   Bacterial infection of knee joint   DVT of lower extremity, bilateral   Acute DVT  -Acute DVT, bilateral of lower extremities.  -Patient educated about blood thinners adverse effects including minor or major bleeding and bruisability.  -Patient denies any shortness of breath or chest pain, very minimal suspicion for PE.  -Started on Eliquis yesterday.  Septic arthritis  -Per Dr. Marlou Sa, status post washing, vancomycin and Rocephin for total of 4 weeks per Dr. Johnnye Sima.  -I will discontinue the prednisone, this is started by ED physician and 03/24/2014 for knee swelling.   Type 2 diabetes mellitus  -Carb modified diet, SSI.  -Check hemoglobin A1c.   HTN  -Continue home medications   Code Status: Full code Family Communication: Plan discussed with the patient. Disposition Plan: Remains inpatient    Procedures:  Washing of both knees  Antibiotics:  Vancomycin and Rocephin   Objective: Filed Vitals:   04/02/14 0640  BP: 128/68  Pulse: 93  Temp: 98.1 F (36.7 C)  Resp: 16    Intake/Output Summary (Last 24 hours) at 04/02/14 1344 Last data filed at 04/01/14 1730  Gross per 24 hour  Intake    444 ml  Output      0 ml  Net    444 ml   Filed Weights   03/29/14 1319 03/29/14 1921  Weight: 90.719 kg (200 lb) 90.8 kg (200 lb 2.8 oz)    Exam: General: Alert and awake, oriented x3, not in any acute distress. HEENT: anicteric sclera, pupils reactive to light and accommodation, EOMI CVS: S1-S2 clear, no murmur rubs or gallops Chest: clear to auscultation bilaterally, no wheezing, rales or rhonchi Abdomen: soft  nontender, nondistended, normal bowel sounds, no organomegaly Extremities: no cyanosis, clubbing or edema noted bilaterally Neuro: Cranial nerves II-XII intact, no focal neurological deficits  Data Reviewed: Basic Metabolic Panel:  Recent Labs Lab 03/31/14 0550 04/02/14 0459  NA 136* 133*  K 4.9 4.4  CL 99 96  CO2 26 25  GLUCOSE 170* 185*  BUN 17 17  CREATININE 0.80 0.77  CALCIUM 9.0 8.7   Liver Function Tests: No results found for this basename: AST, ALT, ALKPHOS, BILITOT, PROT, ALBUMIN,  in the last 168 hours No results found for this basename: LIPASE, AMYLASE,  in the last 168 hours No results found for this basename: AMMONIA,  in the last 168 hours CBC:  Recent Labs Lab 04/02/14 0459  WBC 11.1*  HGB 10.4*  HCT 31.2*  MCV 86.7  PLT 714*   Cardiac Enzymes: No results found for this basename: CKTOTAL, CKMB, CKMBINDEX, TROPONINI,  in the last 168 hours BNP (last 3 results) No results found for this basename: PROBNP,  in the last 8760 hours CBG:  Recent Labs Lab 04/01/14 1146 04/01/14 1705 04/01/14 2207 04/02/14 0758 04/02/14 1153  GLUCAP 179* 198* 167* 148* 167*    Micro Recent Results (from the past 240 hour(s))  ANAEROBIC CULTURE     Status: None   Collection Time    03/29/14  4:01 PM      Result Value Ref Range Status   Specimen Description SYNOVIAL FLUID KNEE LEFT   Final   Special Requests 10ML FLUID  Final   Gram Stain     Final   Value: ABUNDANT WBC PRESENT, PREDOMINANTLY PMN     NO ORGANISMS SEEN     Performed at [facility]   Culture     Final   Value: NO ANAEROBES ISOLATED; CULTURE IN PROGRESS FOR 5 DAYS     Performed at [facility]   Report Status PENDING   Incomplete  BODY FLUID CULTURE     Status: None   Collection Time    03/29/14  4:01 PM      Result Value Ref Range Status   Specimen Description SYNOVIAL FLUID KNEE LEFT   Final   Special Requests 10ML FLUID   Final   Gram Stain     Final   Value: ABUNDANT WBC  PRESENT, PREDOMINANTLY PMN     NO ORGANISMS SEEN     Performed at [facility]   Culture     Final   Value: NO GROWTH 3 DAYS     Performed at [facility]   Report Status PENDING   Incomplete  ANAEROBIC CULTURE     Status: None   Collection Time    03/29/14  4:23 PM      Result Value Ref Range Status   Specimen Description FLUID SYNOVIAL RIGHT KNEE   Final   Special Requests SYRINGE   Final   Gram Stain     Final   Value: ABUNDANT WBC PRESENT, PREDOMINANTLY PMN     NO ORGANISMS SEEN     Performed at [facility]   Culture     Final   Value: NO ANAEROBES ISOLATED; CULTURE IN PROGRESS FOR 5 DAYS     Performed at [facility]   Report Status PENDING   Incomplete  BODY FLUID CULTURE     Status: None   Collection Time    03/29/14  4:23 PM      Result Value Ref Range Status   Specimen Description FLUID SYNOVIAL RIGHT KNEE   Final   Special Requests SYRINGE   Final   Gram Stain     Final   Value: ABUNDANT WBC PRESENT, PREDOMINANTLY PMN     NO ORGANISMS SEEN     Performed at [facility]   Culture     Final   Value: NO GROWTH 2 DAYS     Performed at [facility]   Report Status PENDING   Incomplete  ANAEROBIC CULTURE     Status: None   Collection Time    03/29/14  4:25 PM      Result Value Ref Range Status   Specimen Description FLUID SYNOVIAL RIGHT KNEE   Final   Special Requests ON SWAB   Final   Gram Stain     Final   Value: ABUNDANT WBC PRESENT, PREDOMINANTLY PMN     NO ORGANISMS SEEN     Performed at [facility]   Culture     Final   Value: NO ANAEROBES ISOLATED; CULTURE IN PROGRESS FOR 5 DAYS     Performed at [facility]   Report Status PENDING   Incomplete  BODY FLUID CULTURE     Status: None   Collection Time    03/29/14  4:25 PM      Result Value Ref Range Status   Specimen Description FLUID SYNOVIAL RIGHT KNEE   Final   Special Requests ON SWAB   Final   Gram Stain  Final   Value: ABUNDANT  WBC PRESENT, PREDOMINANTLY PMN     NO ORGANISMS SEEN     Performed at [facility]   Culture     Final   Value: NO GROWTH 3 DAYS     Performed at [facility]   Report Status PENDING   Incomplete     Studies: No results found.  Scheduled Meds: . apixaban  10 mg Oral BID  . [START ON 04/09/2014] apixaban  5 mg Oral BID  . cefTRIAXone (ROCEPHIN)  IV  1 g Intravenous Q24H  . colchicine  0.6 mg Oral Daily  . insulin aspart  0-9 Units Subcutaneous TID WC  . lisinopril  20 mg Oral Daily  . metFORMIN  2,000 mg Oral QHS  . simvastatin  20 mg Oral Daily  . vancomycin  1,500 mg Intravenous Q12H   Continuous Infusions:      Time spent: 35 minutes    Verlee Monte  [facility] Pager [phone] If 7PM-7AM, please contact night-coverage at [email or web address], password [facility] 04/02/2014, 1:44 PM  LOS: 4 days

## 2014-04-02 NOTE — Progress Notes (Signed)
Subjective: Patient still having pain in his lower extremities. Right knee is worse than the left. Patient was able to stand in the room yesterday. Ultrasound positive for DVT. I appreciate assistance from Dr. Hartford Poli  Objective: Vital signs in last 24 hours: Temp:  [97.5 F (36.4 C)-98.3 F (36.8 C)] 98.1 F (36.7 C) (05/16 0640) Pulse Rate:  [91-98] 93 (05/16 0640) Resp:  [15-16] 16 (05/16 0640) BP: (102-148)/(60-76) 128/68 mmHg (05/16 0640) SpO2:  [98 %-99 %] 98 % (05/16 0640)  Intake/Output from previous day: 05/15 0701 - 05/16 0700 In: 888 [P.O.:888] Out: -  Intake/Output this shift:    Exam:  Intact pulses distally No cellulitis present  Labs:  Recent Labs  04/02/14 0459  HGB 10.4*    Recent Labs  04/02/14 0459  WBC 11.1*  RBC 3.60*  HCT 31.2*  PLT 714*    Recent Labs  03/31/14 0550 04/02/14 0459  NA 136* 133*  K 4.9 4.4  CL 99 96  CO2 26 25  BUN 17 17  CREATININE 0.80 0.77  GLUCOSE 170* 185*  CALCIUM 9.0 8.7   No results found for this basename: LABPT, INR,  in the last 72 hours  Assessment/Plan: Impression is bilateral lower extremity DVT status post arthroscopic I&D and washout bilateral knees Patient will be treated for both his DVT and joint infection over the next several weeks. Patient still slow to mobilize. I believe it is likely he will require skilled nursing placement.   Tonna Corner Dean 04/02/2014, 9:35 AM

## 2014-04-02 NOTE — Progress Notes (Signed)
Physical Therapy Treatment Patient Details Name: [name] MRN: [id number] DOB: [birth year] Today's Date: 04/02/2014    History of Present Illness Pt is a 73 y.o. male s/p Right and left knee arthroscopy  and debridement; with Lt knee aspiration.  Bil. DVT.    PT Comments    Pt required mod-A +2 for supine to sit and max-A +2 for sit<>stand and pivot transfers.  Pt very limited by pain despite being premedicated.  Will continue to follow.  Follow Up Recommendations  SNF;Supervision/Assistance - 24 hour     Equipment Recommendations  None recommended by PT    Recommendations for Other Services OT consult     Precautions / Restrictions Precautions Precautions: Fall Restrictions Weight Bearing Restrictions: No    Mobility  Bed Mobility Overal bed mobility: Needs Assistance Bed Mobility: Supine to Sit     Supine to sit: HOB elevated;Mod assist;+2 for physical assistance     General bed mobility comments: Pt required mod-A +2 to help move bil. LE over EOB and help to raise trunk up to vertical for supine to sit.  Transfers Overall transfer level: Needs assistance Equipment used: Rolling walker (2 wheeled) Transfers: Sit to/from [facility] Sit to Stand: Max assist;+2 physical assistance Stand pivot transfers: Max assist;+2 physical assistance       General transfer comment: Pt able to assist in coming to stand with repeated VC to push through bil. UE and bring hips forward upon standing.  He required heavy WB through bil. UE on walker in addition to max-A +2.  Pt afraid of falling with pivot transfer and max-A +2.  Ambulation/Gait                 Stairs            Wheelchair Mobility    Modified Rankin (Stroke Patients Only)       Balance Overall balance assessment: Needs assistance Sitting-balance support: Feet supported Sitting balance-Leahy Scale: Poor   Postural control: Posterior lean Standing balance support:  Bilateral upper extremity supported Standing balance-Leahy Scale: Zero Standing balance comment: Pt has difficulty maintaining WB through bil. LE in standing due to knee pain, even with heavy support through bil. UE on RW and max-A +2.  Pt able to stand ~3 seconds before requesting to sit to take weight off of his knees.                    Cognition Arousal/Alertness: Awake/alert Behavior During Therapy: WFL for tasks assessed/performed Overall Cognitive Status: Impaired/Different from baseline Area of Impairment: Following commands;Attention   Current Attention Level: Alternating           General Comments: Pt required repeated verbal commands and questions to respond.    Exercises      General Comments        Pertinent Vitals/Pain Pt reports high pain level with movement of bil. Knees.  Pt premedicated.    Home Living                      Prior Function            PT Goals (current goals can now be found in the care plan section) Acute Rehab PT Goals Patient Stated Goal: none stated PT Goal Formulation: With patient Time For Goal Achievement: 04/06/14 Potential to Achieve Goals: Good Progress towards PT goals: Progressing toward goals    Frequency  Min 3X/week    PT Plan Current plan  remains appropriate    Co-evaluation             End of Session Equipment Utilized During Treatment: Gait belt Activity Tolerance: Patient limited by pain Patient left: in chair;with chair alarm set;with call bell/phone within reach;with family/visitor present     Time: 0822-0852 PT Time Calculation (min): 30 min  Charges:                       G Codes:      Mourad Cwikla, SPT 04/02/2014, 9:33 AM

## 2014-04-03 LAB — ANAEROBIC CULTURE

## 2014-04-03 LAB — VANCOMYCIN, TROUGH: VANCOMYCIN TR: 13.8 ug/mL (ref 10.0–20.0)

## 2014-04-03 LAB — GLUCOSE, CAPILLARY
GLUCOSE-CAPILLARY: 205 mg/dL — AB (ref 70–99)
Glucose-Capillary: 130 mg/dL — ABNORMAL HIGH (ref 70–99)
Glucose-Capillary: 124 mg/dL — ABNORMAL HIGH (ref 70–99)
Glucose-Capillary: 127 mg/dL — ABNORMAL HIGH (ref 70–99)

## 2014-04-03 MED ORDER — CYCLOBENZAPRINE HCL 10 MG PO TABS
5.0000 mg | ORAL_TABLET | Freq: Three times a day (TID) | ORAL | Status: DC | PRN
  Administered 2014-04-03 – 2014-04-05 (×5): 5 mg via ORAL
  Filled 2014-04-03 (×2): qty 1
  Filled 2014-04-03: qty 0.5

## 2014-04-03 MED ORDER — METHOCARBAMOL 500 MG PO TABS
1000.0000 mg | ORAL_TABLET | Freq: Once | ORAL | Status: AC
  Administered 2014-04-03: 1000 mg via ORAL
  Filled 2014-04-03: qty 2

## 2014-04-03 NOTE — Progress Notes (Signed)
ANTIBIOTIC CONSULT NOTE Pharmacy Consult for vancomycin  Indication: bilateral septic arthritis  Patient Measurements: Height: 5\' 8"  (172.7 cm) Weight: 200 lb 2.8 oz (90.8 kg) IBW/kg (Calculated) : 68.4  Vital Signs: Temp: 99.1 F (37.3 C) (05/17 2152) Temp src: Oral (05/17 2152) BP: 132/66 mmHg (05/17 2152) Pulse Rate: 106 (05/17 2152) Intake/Output from previous day: 05/16 0701 - 05/17 0700 In: 240 [P.O.:240] Out: -  Intake/Output from this shift:    Labs:  Recent Labs  04/02/14 0459  WBC 11.1*  HGB 10.4*  PLT 714*  CREATININE 0.77   Estimated Creatinine Clearance: 91.4 ml/min (by C-G formula based on Cr of 0.77).  Recent Labs  04/01/14 0708 04/03/14 2200  VANCOTROUGH 9.0* 13.8     Microbiology: Recent Results (from the past 720 hour(s))  ANAEROBIC CULTURE     Status: None   Collection Time    03/29/14  4:01 PM      Result Value Ref Range Status   Specimen Description SYNOVIAL FLUID KNEE LEFT   Final   Special Requests 10ML FLUID   Final   Gram Stain     Final   Value: ABUNDANT WBC PRESENT, PREDOMINANTLY PMN     NO ORGANISMS SEEN     Performed at [facility]   Culture     Final   Value: NO ANAEROBES ISOLATED     Performed at [facility]   Report Status 04/03/2014 FINAL   Final  BODY FLUID CULTURE     Status: None   Collection Time    03/29/14  4:01 PM      Result Value Ref Range Status   Specimen Description SYNOVIAL FLUID KNEE LEFT   Final   Special Requests 10ML FLUID   Final   Gram Stain     Final   Value: ABUNDANT WBC PRESENT, PREDOMINANTLY PMN     NO ORGANISMS SEEN     Performed at [facility]   Culture     Final   Value: NO GROWTH 3 DAYS     Performed at [facility]   Report Status 04/02/2014 FINAL   Final  ANAEROBIC CULTURE     Status: None   Collection Time    03/29/14  4:23 PM      Result Value Ref Range Status   Specimen Description FLUID SYNOVIAL RIGHT KNEE   Final   Special Requests SYRINGE    Final   Gram Stain     Final   Value: ABUNDANT WBC PRESENT, PREDOMINANTLY PMN     NO ORGANISMS SEEN     Performed at [facility]   Culture     Final   Value: NO ANAEROBES ISOLATED     Performed at [facility]   Report Status 04/03/2014 FINAL   Final  BODY FLUID CULTURE     Status: None   Collection Time    03/29/14  4:23 PM      Result Value Ref Range Status   Specimen Description FLUID SYNOVIAL RIGHT KNEE   Final   Special Requests SYRINGE   Final   Gram Stain     Final   Value: ABUNDANT WBC PRESENT, PREDOMINANTLY PMN     NO ORGANISMS SEEN     Performed at [facility]   Culture     Final   Value: NO GROWTH 3 DAYS     Performed at [facility]   Report Status 04/02/2014 FINAL   Final  ANAEROBIC CULTURE     Status: None   Collection Time    03/29/14  4:25 PM      Result Value Ref Range Status   Specimen Description FLUID SYNOVIAL RIGHT KNEE   Final   Special Requests ON SWAB   Final   Gram Stain     Final   Value: ABUNDANT WBC PRESENT, PREDOMINANTLY PMN     NO ORGANISMS SEEN     Performed at [facility]   Culture     Final   Value: NO ANAEROBES ISOLATED     Performed at [facility]   Report Status 04/03/2014 FINAL   Final  BODY FLUID CULTURE     Status: None   Collection Time    03/29/14  4:25 PM      Result Value Ref Range Status   Specimen Description FLUID SYNOVIAL RIGHT KNEE   Final   Special Requests ON SWAB   Final   Gram Stain     Final   Value: ABUNDANT WBC PRESENT, PREDOMINANTLY PMN     NO ORGANISMS SEEN     Performed at [facility]   Culture     Final   Value: RARE VIRIDANS STREPTOCOCCUS     Performed at [facility]   Report Status PENDING   Incomplete    Assessment: 73 yo man with septic arthritis for vancomycin.  Goal of Therapy:  Vancomycin trough level 10-15 mcg/ml  Plan:  Continue Vancomycin 1500 mg IV q12h  Phillis Knack, PharmD, BCPS   04/03/2014 11:06 PM

## 2014-04-03 NOTE — Progress Notes (Signed)
Subjective: Pt stable - slow to mobilize   Objective: Vital signs in last 24 hours: Temp:  [98.9 F (37.2 C)-99.4 F (37.4 C)] 99.1 F (37.3 C) (05/17 0837) Pulse Rate:  [90-103] 99 (05/17 0420) Resp:  [16] 16 (05/17 0420) BP: (113-135)/(67-75) 135/75 mmHg (05/17 0420) SpO2:  [96 %-98 %] 98 % (05/17 0420)  Intake/Output from previous day: 05/16 0701 - 05/17 0700 In: 240 [P.O.:240] Out: -  Intake/Output this shift:    Exam:  Intact pulses distally No cellulitis present  Labs:  Recent Labs  04/02/14 0459  HGB 10.4*    Recent Labs  04/02/14 0459  WBC 11.1*  RBC 3.60*  HCT 31.2*  PLT 714*    Recent Labs  04/02/14 0459  NA 133*  K 4.4  CL 96  CO2 25  BUN 17  CREATININE 0.77  GLUCOSE 185*  CALCIUM 8.7   No results found for this basename: LABPT, INR,  in the last 72 hours  Assessment/Plan: Pt will need snf - fl2 signed   Meredith Pel 04/03/2014, 10:42 AM

## 2014-04-03 NOTE — Progress Notes (Signed)
[facility] PROGRESS NOTE   [name] JKD:[id number] DOB: [birth year] DOA: 03/29/2014 PCP: Tawanna Solo, MD  HPI/Subjective: Seen with daughter at bedside, had low-grade fever this morning. Urine clear from last night. Still complaining about spasm around the knees. I think it is pain with movement rather than spasms.  Assessment/Plan: Active Problems:   Essential hypertension   Diabetes mellitus type II, controlled   Bacterial infection of knee joint   DVT of lower extremity, bilateral   Acute DVT  -Acute DVT, bilateral of lower extremities.  -Patient educated about blood thinners adverse effects including minor or major bleeding and bruisability.  -Patient denies any shortness of breath or chest pain, very minimal suspicion for PE.  -Started on Eliquis yesterday.  Septic arthritis  -Per Dr. Marlou Sa, status post washing, vancomycin and Rocephin for total of 4 weeks per Dr. Johnnye Sima.  -I will discontinue the prednisone, this is started by ED physician and 03/24/2014 for knee swelling.   Type 2 diabetes mellitus  -Carb modified diet, SSI.  -Hemoglobin A1c is 7.5 which consisted with hemoglobin A1c of 169.   HTN  -Continue home medications   Code Status: Full code Family Communication: Plan discussed with the patient. Disposition Plan: Remains inpatient    Procedures:  Washing of both knees  Antibiotics:  Vancomycin and Rocephin   Objective: Filed Vitals:   04/03/14 0837  BP:   Pulse:   Temp: 99.1 F (37.3 C)  Resp:     Intake/Output Summary (Last 24 hours) at 04/03/14 1020 Last data filed at 04/03/14 0600  Gross per 24 hour  Intake    240 ml  Output      0 ml  Net    240 ml   Filed Weights   03/29/14 1319 03/29/14 1921  Weight: 90.719 kg (200 lb) 90.8 kg (200 lb 2.8 oz)    Exam: General: Alert and awake, oriented x3, not in any acute distress. HEENT: anicteric sclera, pupils reactive to light and accommodation, EOMI CVS: S1-S2  clear, no murmur rubs or gallops Chest: clear to auscultation bilaterally, no wheezing, rales or rhonchi Abdomen: soft nontender, nondistended, normal bowel sounds, no organomegaly Extremities: no cyanosis, clubbing or edema noted bilaterally Neuro: Cranial nerves II-XII intact, no focal neurological deficits  Data Reviewed: Basic Metabolic Panel:  Recent Labs Lab 03/31/14 0550 04/02/14 0459  NA 136* 133*  K 4.9 4.4  CL 99 96  CO2 26 25  GLUCOSE 170* 185*  BUN 17 17  CREATININE 0.80 0.77  CALCIUM 9.0 8.7   Liver Function Tests: No results found for this basename: AST, ALT, ALKPHOS, BILITOT, PROT, ALBUMIN,  in the last 168 hours No results found for this basename: LIPASE, AMYLASE,  in the last 168 hours No results found for this basename: AMMONIA,  in the last 168 hours CBC:  Recent Labs Lab 04/02/14 0459  WBC 11.1*  HGB 10.4*  HCT 31.2*  MCV 86.7  PLT 714*   Cardiac Enzymes: No results found for this basename: CKTOTAL, CKMB, CKMBINDEX, TROPONINI,  in the last 168 hours BNP (last 3 results) No results found for this basename: PROBNP,  in the last 8760 hours CBG:  Recent Labs Lab 04/02/14 0758 04/02/14 1153 04/02/14 1702 04/02/14 2052 04/03/14 0753  GLUCAP 148* 167* 135* 158* 130*    Micro Recent Results (from the past 240 hour(s))  ANAEROBIC CULTURE     Status: None   Collection Time    03/29/14  4:01 PM  Result Value Ref Range Status   Specimen Description SYNOVIAL FLUID KNEE LEFT   Final   Special Requests 10ML FLUID   Final   Gram Stain     Final   Value: ABUNDANT WBC PRESENT, PREDOMINANTLY PMN     NO ORGANISMS SEEN     Performed at [facility]   Culture     Final   Value: NO ANAEROBES ISOLATED; CULTURE IN PROGRESS FOR 5 DAYS     Performed at [facility]   Report Status PENDING   Incomplete  BODY FLUID CULTURE     Status: None   Collection Time    03/29/14  4:01 PM      Result Value Ref Range Status   Specimen  Description SYNOVIAL FLUID KNEE LEFT   Final   Special Requests 10ML FLUID   Final   Gram Stain     Final   Value: ABUNDANT WBC PRESENT, PREDOMINANTLY PMN     NO ORGANISMS SEEN     Performed at [facility]   Culture     Final   Value: NO GROWTH 3 DAYS     Performed at [facility]   Report Status 04/02/2014 FINAL   Final  ANAEROBIC CULTURE     Status: None   Collection Time    03/29/14  4:23 PM      Result Value Ref Range Status   Specimen Description FLUID SYNOVIAL RIGHT KNEE   Final   Special Requests SYRINGE   Final   Gram Stain     Final   Value: ABUNDANT WBC PRESENT, PREDOMINANTLY PMN     NO ORGANISMS SEEN     Performed at [facility]   Culture     Final   Value: NO ANAEROBES ISOLATED; CULTURE IN PROGRESS FOR 5 DAYS     Performed at [facility]   Report Status PENDING   Incomplete  BODY FLUID CULTURE     Status: None   Collection Time    03/29/14  4:23 PM      Result Value Ref Range Status   Specimen Description FLUID SYNOVIAL RIGHT KNEE   Final   Special Requests SYRINGE   Final   Gram Stain     Final   Value: ABUNDANT WBC PRESENT, PREDOMINANTLY PMN     NO ORGANISMS SEEN     Performed at [facility]   Culture     Final   Value: NO GROWTH 3 DAYS     Performed at [facility]   Report Status 04/02/2014 FINAL   Final  ANAEROBIC CULTURE     Status: None   Collection Time    03/29/14  4:25 PM      Result Value Ref Range Status   Specimen Description FLUID SYNOVIAL RIGHT KNEE   Final   Special Requests ON SWAB   Final   Gram Stain     Final   Value: ABUNDANT WBC PRESENT, PREDOMINANTLY PMN     NO ORGANISMS SEEN     Performed at [facility]   Culture     Final   Value: NO ANAEROBES ISOLATED; CULTURE IN PROGRESS FOR 5 DAYS     Performed at [facility]   Report Status PENDING   Incomplete  BODY FLUID CULTURE     Status: None   Collection Time    03/29/14  4:25 PM      Result Value Ref Range Status  Specimen Description FLUID SYNOVIAL RIGHT KNEE   Final   Special Requests ON SWAB   Final   Gram Stain     Final   Value: ABUNDANT WBC PRESENT, PREDOMINANTLY PMN     NO ORGANISMS SEEN     Performed at [facility]   Culture     Final   Value: Culture reincubated for better growth     Performed at [facility]   Report Status PENDING   Incomplete     Studies: No results found.  Scheduled Meds: . apixaban  10 mg Oral BID  . [START ON 04/09/2014] apixaban  5 mg Oral BID  . cefTRIAXone (ROCEPHIN)  IV  1 g Intravenous Q24H  . colchicine  0.6 mg Oral Daily  . insulin aspart  0-9 Units Subcutaneous TID WC  . lisinopril  20 mg Oral Daily  . metFORMIN  2,000 mg Oral QHS  . simvastatin  20 mg Oral Daily  . vancomycin  1,500 mg Intravenous Q12H   Continuous Infusions:      Time spent: 35 minutes    Verlee Monte  [facility] Pager [phone] If 7PM-7AM, please contact night-coverage at [email or web address], password [facility] 04/03/2014, 10:20 AM  LOS: 5 days

## 2014-04-04 LAB — BODY FLUID CULTURE

## 2014-04-04 LAB — CBC
HEMATOCRIT: 32.1 % — AB (ref 39.0–52.0)
HEMOGLOBIN: 10.5 g/dL — AB (ref 13.0–17.0)
MCH: 28.6 pg (ref 26.0–34.0)
MCHC: 32.7 g/dL (ref 30.0–36.0)
MCV: 87.5 fL (ref 78.0–100.0)
Platelets: 675 10*3/uL — ABNORMAL HIGH (ref 150–400)
RBC: 3.67 MIL/uL — AB (ref 4.22–5.81)
RDW: 13.4 % (ref 11.5–15.5)
WBC: 12.8 10*3/uL — ABNORMAL HIGH (ref 4.0–10.5)

## 2014-04-04 LAB — GLUCOSE, CAPILLARY
GLUCOSE-CAPILLARY: 148 mg/dL — AB (ref 70–99)
GLUCOSE-CAPILLARY: 143 mg/dL — AB (ref 70–99)
Glucose-Capillary: 144 mg/dL — ABNORMAL HIGH (ref 70–99)
Glucose-Capillary: 191 mg/dL — ABNORMAL HIGH (ref 70–99)

## 2014-04-04 MED ORDER — APIXABAN 5 MG PO TABS: 5.0000 mg | ORAL_TABLET | Freq: Two times a day (BID) | ORAL | Status: DC

## 2014-04-04 MED ORDER — APIXABAN 5 MG PO TABS: 10.0000 mg | ORAL_TABLET | Freq: Two times a day (BID) | ORAL | Status: DC

## 2014-04-04 MED ORDER — CYCLOBENZAPRINE HCL 5 MG PO TABS: 5.0000 mg | ORAL_TABLET | Freq: Three times a day (TID) | ORAL | Status: DC | PRN

## 2014-04-04 MED ORDER — RIVAROXABAN (XARELTO) EDUCATION KIT FOR DVT/PE PATIENTS
PACK | Freq: Once | Status: AC
  Administered 2014-04-04: 17:00:00
  Filled 2014-04-04: qty 1

## 2014-04-04 MED ORDER — HEPARIN SOD (PORK) LOCK FLUSH 100 UNIT/ML IV SOLN
250.0000 [IU] | INTRAVENOUS | Status: AC | PRN
  Administered 2014-04-04: 15:00:00

## 2014-04-04 MED ORDER — RIVAROXABAN 15 MG PO TABS
15.0000 mg | ORAL_TABLET | Freq: Two times a day (BID) | ORAL | Status: DC
  Administered 2014-04-04 – 2014-04-05 (×2): 15 mg via ORAL
  Filled 2014-04-04 (×4): qty 1

## 2014-04-04 MED ORDER — VANCOMYCIN HCL 10 G IV SOLR: 1500.0000 mg | Freq: Two times a day (BID) | INTRAVENOUS | Status: DC

## 2014-04-04 NOTE — Progress Notes (Signed)
Addendum  Patient seen and examined, chart and data base reviewed.  I agree with the above assessment and plan.  For full details please see Mrs. Imogene Burn PA note.  Eliquis for 3-6 months. Per ID recommendation vancomycin and Rocephin for total of 4 weeks.   Birdie Hopes, MD [facility] Pager: [phone] 04/04/2014, 12:21 PM

## 2014-04-04 NOTE — Progress Notes (Addendum)
INFECTIOUS DISEASE PROGRESS NOTE  ID: [name] is a 73 y.o. male with  Active Problems:   Essential hypertension   Diabetes mellitus type II, controlled   Bacterial infection of knee joint   DVT of lower extremity, bilateral  Subjective: Resting quietly.   Abtx:  Anti-infectives   Start     Dose/Rate Route Frequency Ordered Stop   04/04/14 0000  vancomycin 1,500 mg in sodium chloride 0.9 % 500 mL     1,500 mg 250 mL/hr over 120 Minutes Intravenous Every 12 hours 04/04/14 1356     04/01/14 0900  vancomycin (VANCOCIN) 1,500 mg in sodium chloride 0.9 % 500 mL IVPB     1,500 mg 250 mL/hr over 120 Minutes Intravenous Every 12 hours 04/01/14 0811     03/30/14 0800  vancomycin (VANCOCIN) IVPB 1000 mg/200 mL premix  Status:  Discontinued     1,000 mg 200 mL/hr over 60 Minutes Intravenous Every 12 hours 03/29/14 1902 04/01/14 0808   03/29/14 2000  cefTRIAXone (ROCEPHIN) 1 g in dextrose 5 % 50 mL IVPB     1 g 100 mL/hr over 30 Minutes Intravenous Every 24 hours 03/29/14 1930     03/29/14 1900  vancomycin (VANCOCIN) 1,750 mg in sodium chloride 0.9 % 500 mL IVPB    Comments:  Send to PACU   1,750 mg 250 mL/hr over 120 Minutes Intravenous  Once 03/29/14 1857 03/29/14 2257   03/29/14 1900  piperacillin-tazobactam (ZOSYN) IVPB 3.375 g  Status:  Discontinued     3.375 g 12.5 mL/hr over 240 Minutes Intravenous Every 8 hours 03/29/14 1857 03/29/14 1930      Medications:  Scheduled: . apixaban  10 mg Oral BID  . [START ON 04/09/2014] apixaban  5 mg Oral BID  . cefTRIAXone (ROCEPHIN)  IV  1 g Intravenous Q24H  . colchicine  0.6 mg Oral Daily  . insulin aspart  0-9 Units Subcutaneous TID WC  . lisinopril  20 mg Oral Daily  . metFORMIN  2,000 mg Oral QHS  . simvastatin  20 mg Oral Daily  . vancomycin  1,500 mg Intravenous Q12H    Objective: Vital signs in last 24 hours: Temp:  [97.9 F (36.6 C)-99.1 F (37.3 C)] 97.9 F (36.6 C) (05/18 1327) Pulse Rate:  [91-107] 107  (05/18 1327) Resp:  [16] 16 (05/18 1327) BP: (119-132)/(63-66) 119/63 mmHg (05/18 0517) SpO2:  [95 %-97 %] 96 % (05/18 1327)   General appearance: fatigued and no distress Resp: clear to auscultation bilaterally Cardio: regular rate and rhythm GI: normal findings: bowel sounds normal and soft, non-tender Incision/Wound: knees are warm, tender, minimal swelling.   Lab Results  Recent Labs  04/02/14 0459 04/04/14 0500  WBC 11.1* 12.8*  HGB 10.4* 10.5*  HCT 31.2* 32.1*  NA 133*  --   K 4.4  --   CL 96  --   CO2 25  --   BUN 17  --   CREATININE 0.77  --    Liver Panel No results found for this basename: PROT, ALBUMIN, AST, ALT, ALKPHOS, BILITOT, BILIDIR, IBILI,  in the last 72 hours Sedimentation Rate No results found for this basename: ESRSEDRATE,  in the last 72 hours C-Reactive Protein No results found for this basename: CRP,  in the last 72 hours  Microbiology: Recent Results (from the past 240 hour(s))  ANAEROBIC CULTURE     Status: None   Collection Time    03/29/14  4:01 PM  Result Value Ref Range Status   Specimen Description SYNOVIAL FLUID KNEE LEFT   Final   Special Requests 10ML FLUID   Final   Gram Stain     Final   Value: ABUNDANT WBC PRESENT, PREDOMINANTLY PMN     NO ORGANISMS SEEN     Performed at [facility]   Culture     Final   Value: NO ANAEROBES ISOLATED     Performed at [facility]   Report Status 04/03/2014 FINAL   Final  BODY FLUID CULTURE     Status: None   Collection Time    03/29/14  4:01 PM      Result Value Ref Range Status   Specimen Description SYNOVIAL FLUID KNEE LEFT   Final   Special Requests 10ML FLUID   Final   Gram Stain     Final   Value: ABUNDANT WBC PRESENT, PREDOMINANTLY PMN     NO ORGANISMS SEEN     Performed at [facility]   Culture     Final   Value: NO GROWTH 3 DAYS     Performed at [facility]   Report Status 04/02/2014 FINAL   Final  ANAEROBIC CULTURE     Status: None    Collection Time    03/29/14  4:23 PM      Result Value Ref Range Status   Specimen Description FLUID SYNOVIAL RIGHT KNEE   Final   Special Requests SYRINGE   Final   Gram Stain     Final   Value: ABUNDANT WBC PRESENT, PREDOMINANTLY PMN     NO ORGANISMS SEEN     Performed at [facility]   Culture     Final   Value: NO ANAEROBES ISOLATED     Performed at [facility]   Report Status 04/03/2014 FINAL   Final  BODY FLUID CULTURE     Status: None   Collection Time    03/29/14  4:23 PM      Result Value Ref Range Status   Specimen Description FLUID SYNOVIAL RIGHT KNEE   Final   Special Requests SYRINGE   Final   Gram Stain     Final   Value: ABUNDANT WBC PRESENT, PREDOMINANTLY PMN     NO ORGANISMS SEEN     Performed at [facility]   Culture     Final   Value: NO GROWTH 3 DAYS     Performed at [facility]   Report Status 04/02/2014 FINAL   Final  ANAEROBIC CULTURE     Status: None   Collection Time    03/29/14  4:25 PM      Result Value Ref Range Status   Specimen Description FLUID SYNOVIAL RIGHT KNEE   Final   Special Requests ON SWAB   Final   Gram Stain     Final   Value: ABUNDANT WBC PRESENT, PREDOMINANTLY PMN     NO ORGANISMS SEEN     Performed at [facility]   Culture     Final   Value: NO ANAEROBES ISOLATED     Performed at [facility]   Report Status 04/03/2014 FINAL   Final  BODY FLUID CULTURE     Status: None   Collection Time    03/29/14  4:25 PM      Result Value Ref Range Status   Specimen Description FLUID SYNOVIAL RIGHT KNEE   Final   Special Requests ON  SWAB   Final   Gram Stain     Final   Value: ABUNDANT WBC PRESENT, PREDOMINANTLY PMN     NO ORGANISMS SEEN     Performed at [facility]   Culture     Final   Value: RARE VIRIDANS STREPTOCOCCUS     Performed at [facility]   Report Status 04/04/2014 FINAL   Final   Organism ID, Bacteria VIRIDANS STREPTOCOCCUS   Final  CULTURE, BLOOD  (ROUTINE X 2)     Status: None   Collection Time    04/03/14 12:45 PM      Result Value Ref Range Status   Specimen Description BLOOD LEFT ARM   Final   Special Requests BOTTLES DRAWN AEROBIC ONLY 10CC   Final   Culture  Setup Time     Final   Value: 04/03/2014 22:02     Performed at [facility]   Culture     Final   Value:        BLOOD CULTURE RECEIVED NO GROWTH TO DATE CULTURE WILL BE HELD FOR 5 DAYS BEFORE ISSUING A FINAL NEGATIVE REPORT     Performed at [facility]   Report Status PENDING   Incomplete  CULTURE, BLOOD (ROUTINE X 2)     Status: None   Collection Time    04/03/14 12:50 PM      Result Value Ref Range Status   Specimen Description BLOOD LEFT ARM   Final   Special Requests BOTTLES DRAWN AEROBIC ONLY 10CC   Final   Culture  Setup Time     Final   Value: 04/03/2014 22:02     Performed at [facility]   Culture     Final   Value:        BLOOD CULTURE RECEIVED NO GROWTH TO DATE CULTURE WILL BE HELD FOR 5 DAYS BEFORE ISSUING A FINAL NEGATIVE REPORT     Performed at [facility]   Report Status PENDING   Incomplete    Studies/Results: No results found.   Assessment/Plan: Septic Arthritis R +/o L  DM2  HTN  Loose bm- resolved.   Total days of antibiotics 7 vanco/ceftriaxone His surgical cx is viridans strep.  Will stop vanco Improve Glc control (124-205) Would plan to continue his anbx for 6 weeks  F/u in ID in 1 month     available if questions      [facility] (pager) [phone] [email or web address] 04/04/2014, 2:41 PM  LOS: 6 days   **Disclaimer: This note may have been dictated with voice recognition software. Similar sounding words can inadvertently be transcribed and this note may contain transcription errors which may not have been corrected upon publication of note.**

## 2014-04-04 NOTE — Clinical Social Work Note (Signed)
SNF bed offers provided to patient and family- they have selected private room at [facility]- awaiting Dr Marlou Sa for ?dc today- Fl2 signed.  Eduard Clos, MSW, [place]

## 2014-04-04 NOTE — Progress Notes (Signed)
Subjective: Patient stable was able to get out of bed and stand today plan for SNF tomorrow   Objective: Vital signs in last 24 hours: Temp:  [97.9 F (36.6 C)-99.1 F (37.3 C)] 97.9 F (36.6 C) (05/18 1327) Pulse Rate:  [91-107] 107 (05/18 1327) Resp:  [16] 16 (05/18 1327) BP: (119-132)/(63-66) 119/63 mmHg (05/18 0517) SpO2:  [95 %-97 %] 96 % (05/18 1327)  Intake/Output from previous day: 05/17 0701 - 05/18 0700 In: 440 [IV Piggyback:200] Out: 1300 [Urine:1300] Intake/Output this shift:    Exam:  Dorsiflexion/Plantar flexion intact No cellulitis present Compartment soft  Labs:  Recent Labs  04/02/14 0459 04/04/14 0500  HGB 10.4* 10.5*    Recent Labs  04/02/14 0459 04/04/14 0500  WBC 11.1* 12.8*  RBC 3.60* 3.67*  HCT 31.2* 32.1*  PLT 714* 675*    Recent Labs  04/02/14 0459  NA 133*  K 4.4  CL 96  CO2 25  BUN 17  CREATININE 0.77  GLUCOSE 185*  CALCIUM 8.7   No results found for this basename: LABPT, INR,  in the last 72 hours  Assessment/Plan: Patient making slow but steady progress. He would benefit from SNF placement for intensive rehabilitation he will need to be on anticoagulants for 6 months ready for transfer tomorrow morning   Meredith Pel 04/04/2014, 6:07 PM

## 2014-04-04 NOTE — Discharge Summary (Addendum)
Physician Discharge Summary  Patient ID: [name] MRN: [id number] DOB/AGE: 21-Nov-1940 73 y.o.  Admit date: 03/29/2014 Discharge date: 04/05/2014   Addendum:  Mr. [name] is a patient on Dr. Randel Pigg service.  He remained in the hospital overnight secondary to insurance issues.  We have been asked by Dr. Marlou Sa to re-do his discharge orders.  He will be discharged on Xeralto, 15 mg BID x 21 days, then 20 mg daily on going until discontinued by Dr. Marlou Sa or his primary medical doctor.   Admission Diagnoses:  Active Problems:   Essential hypertension   Diabetes mellitus type II, controlled   Bacterial infection of knee joint   DVT of lower extremity, bilateral  Discharge Diagnoses:  Same  Surgeries: Procedure(s): RIGHT KNEE ARTHROSCOPY AND DEBRIDEMENT, LEFT KNEE ASPIRATION,  LEFT KNEE ARTHROSCOPY AND DEBRIDEMENT. on 03/29/2014   Consultants: Treatment Team:  Sonda Primes, MD  Discharged Condition: Stable  Hospital Course: [name] is an 73 y.o. male who was admitted 03/29/2014 with a chief complaint of bilateral knee pain, and found to have a diagnosis of knee infection.  They were brought to the operating room on 03/29/2014 and underwent the above named procedures. The patient tolerated the knee washouts well, but was slow to mobilize in physical therapy.  Infectious disease was consulted for antibiotic management.  The patient was placed on asprin for DVT prophylaxis after surgery; however, he did develop bilateral DVTs.  Medical service was consulted and they recommend Xeralto for treatment.  He is discharged in good condition for SNF placement and intensive physical therapy.  He will be on Rocephin IV for 6 weeks per id and Xeralto for DVT treatment per medical team     Antibiotics given:      Anti-infectives   Start     Dose/Rate Route Frequency Ordered Stop   04/05/14 0000  cefTRIAXone 1 g in dextrose 5 % 50 mL     1 g 100 mL/hr over 30 Minutes Intravenous Every 24  hours 04/05/14 1012     04/04/14 0000  vancomycin 1,500 mg in sodium chloride 0.9 % 500 mL  Status:  Discontinued     1,500 mg 250 mL/hr over 120 Minutes Intravenous Every 12 hours 04/04/14 1356 04/05/14    04/01/14 0900  vancomycin (VANCOCIN) 1,500 mg in sodium chloride 0.9 % 500 mL IVPB  Status:  Discontinued     1,500 mg 250 mL/hr over 120 Minutes Intravenous Every 12 hours 04/01/14 0811 04/04/14 1449   03/30/14 0800  vancomycin (VANCOCIN) IVPB 1000 mg/200 mL premix  Status:  Discontinued     1,000 mg 200 mL/hr over 60 Minutes Intravenous Every 12 hours 03/29/14 1902 04/01/14 0808   03/29/14 2000  cefTRIAXone (ROCEPHIN) 1 g in dextrose 5 % 50 mL IVPB     1 g 100 mL/hr over 30 Minutes Intravenous Every 24 hours 03/29/14 1930     03/29/14 1900  vancomycin (VANCOCIN) 1,750 mg in sodium chloride 0.9 % 500 mL IVPB    Comments:  Send to PACU   1,750 mg 250 mL/hr over 120 Minutes Intravenous  Once 03/29/14 1857 03/29/14 2257   03/29/14 1900  piperacillin-tazobactam (ZOSYN) IVPB 3.375 g  Status:  Discontinued     3.375 g 12.5 mL/hr over 240 Minutes Intravenous Every 8 hours 03/29/14 1857 03/29/14 1930    .  Recent vital signs:  Filed Vitals:   04/05/14 0942  BP: 128/80  Pulse:   Temp:   Resp:  Recent laboratory studies:  Results for orders placed during the hospital encounter of 03/29/14  ANAEROBIC CULTURE      Result Value Ref Range   Specimen Description SYNOVIAL FLUID KNEE LEFT     Special Requests 10ML FLUID     Gram Stain       Value: ABUNDANT WBC PRESENT, PREDOMINANTLY PMN     NO ORGANISMS SEEN     Performed at [facility]   Culture       Value: NO ANAEROBES ISOLATED     Performed at [facility]   Report Status 04/03/2014 FINAL    BODY FLUID CULTURE      Result Value Ref Range   Specimen Description SYNOVIAL FLUID KNEE LEFT     Special Requests 10ML FLUID     Gram Stain       Value: ABUNDANT WBC PRESENT, PREDOMINANTLY PMN     NO ORGANISMS SEEN      Performed at [facility]   Culture       Value: NO GROWTH 3 DAYS     Performed at [facility]   Report Status 04/02/2014 FINAL    ANAEROBIC CULTURE      Result Value Ref Range   Specimen Description FLUID SYNOVIAL RIGHT KNEE     Special Requests SYRINGE     Gram Stain       Value: ABUNDANT WBC PRESENT, PREDOMINANTLY PMN     NO ORGANISMS SEEN     Performed at [facility]   Culture       Value: NO ANAEROBES ISOLATED     Performed at [facility]   Report Status 04/03/2014 FINAL    BODY FLUID CULTURE      Result Value Ref Range   Specimen Description FLUID SYNOVIAL RIGHT KNEE     Special Requests SYRINGE     Gram Stain       Value: ABUNDANT WBC PRESENT, PREDOMINANTLY PMN     NO ORGANISMS SEEN     Performed at [facility]   Culture       Value: NO GROWTH 3 DAYS     Performed at [facility]   Report Status 04/02/2014 FINAL    ANAEROBIC CULTURE      Result Value Ref Range   Specimen Description FLUID SYNOVIAL RIGHT KNEE     Special Requests ON SWAB     Gram Stain       Value: ABUNDANT WBC PRESENT, PREDOMINANTLY PMN     NO ORGANISMS SEEN     Performed at [facility]   Culture       Value: NO ANAEROBES ISOLATED     Performed at [facility]   Report Status 04/03/2014 FINAL    BODY FLUID CULTURE      Result Value Ref Range   Specimen Description FLUID SYNOVIAL RIGHT KNEE     Special Requests ON SWAB     Gram Stain       Value: ABUNDANT WBC PRESENT, PREDOMINANTLY PMN     NO ORGANISMS SEEN     Performed at [facility]   Culture       Value: RARE VIRIDANS STREPTOCOCCUS     Performed at [facility]   Report Status 04/04/2014 FINAL     Organism ID, Bacteria VIRIDANS STREPTOCOCCUS    CULTURE, BLOOD (ROUTINE X 2)      Result Value Ref Range   Specimen Description BLOOD LEFT  ARM     Special Requests BOTTLES DRAWN AEROBIC ONLY 10CC     Culture  Setup Time       Value: 04/03/2014 22:02      Performed at [facility]   Culture       Value:        BLOOD CULTURE RECEIVED NO GROWTH TO DATE CULTURE WILL BE HELD FOR 5 DAYS BEFORE ISSUING A FINAL NEGATIVE REPORT     Performed at [facility]   Report Status PENDING    CULTURE, BLOOD (ROUTINE X 2)      Result Value Ref Range   Specimen Description BLOOD LEFT ARM     Special Requests BOTTLES DRAWN AEROBIC ONLY 10CC     Culture  Setup Time       Value: 04/03/2014 22:02     Performed at [facility]   Culture       Value:        BLOOD CULTURE RECEIVED NO GROWTH TO DATE CULTURE WILL BE HELD FOR 5 DAYS BEFORE ISSUING A FINAL NEGATIVE REPORT     Performed at [facility]   Report Status PENDING    GLUCOSE, CAPILLARY      Result Value Ref Range   Glucose-Capillary 158 (*) 70 - 99 mg/dL  GLUCOSE, CAPILLARY      Result Value Ref Range   Glucose-Capillary 144 (*) 70 - 99 mg/dL  SYNOVIAL CELL COUNT + DIFF, W/ CRYSTALS      Result Value Ref Range   Color, Synovial YELLOW  YELLOW   Appearance-Synovial TURBID (*) CLEAR   Crystals, Fluid NO CRYSTALS SEEN     WBC, Synovial 86500 (*) 0 - 200 /cu mm   Neutrophil, Synovial 91 (*) 0 - 25 %   Lymphocytes-Synovial Fld 2  0 - 20 %   Monocyte-Macrophage-Synovial Fluid 7 (*) 50 - 90 %   Eosinophils-Synovial 0  0 - 1 %   Other Cells-SYN 0    SYNOVIAL CELL COUNT + DIFF, W/ CRYSTALS      Result Value Ref Range   Color, Synovial ORANGE (*) YELLOW   Appearance-Synovial TURBID (*) CLEAR   Crystals, Fluid RARE INTRACELLULAR CALCIUM PYROPHOSPHATE NOTED     WBC, Synovial 140000 (*) 0 - 200 /cu mm   Neutrophil, Synovial 89 (*) 0 - 25 %   Lymphocytes-Synovial Fld 1  0 - 20 %   Monocyte-Macrophage-Synovial Fluid 10 (*) 50 - 90 %   Eosinophils-Synovial 0  0 - 1 %   Other Cells-SYN 0    GLUCOSE, CAPILLARY      Result Value Ref Range   Glucose-Capillary 151 (*) 70 - 99 mg/dL  HEMOGLOBIN A1C      Result Value Ref Range   Hemoglobin A1C 7.5 (*) <5.7 %   Mean Plasma  Glucose 169 (*) <117 mg/dL  GLUCOSE, CAPILLARY      Result Value Ref Range   Glucose-Capillary 205 (*) 70 - 99 mg/dL  GLUCOSE, CAPILLARY      Result Value Ref Range   Glucose-Capillary 141 (*) 70 - 99 mg/dL   Comment 1 Documented in Chart     Comment 2 Notify RN    GLUCOSE, CAPILLARY      Result Value Ref Range   Glucose-Capillary 140 (*) 70 - 99 mg/dL   Comment 1 Documented in Chart     Comment 2 Notify RN    BASIC METABOLIC PANEL      Result Value Ref  Range   Sodium 136 (*) 137 - 147 mEq/L   Potassium 4.9  3.7 - 5.3 mEq/L   Chloride 99  96 - 112 mEq/L   CO2 26  19 - 32 mEq/L   Glucose, Bld 170 (*) 70 - 99 mg/dL   BUN 17  6 - 23 mg/dL   Creatinine, Ser 0.80  0.50 - 1.35 mg/dL   Calcium 9.0  8.4 - 10.5 mg/dL   GFR calc non Af Amer 87 (*) >90 mL/min   GFR calc Af Amer >90  >90 mL/min  GLUCOSE, CAPILLARY      Result Value Ref Range   Glucose-Capillary 144 (*) 70 - 99 mg/dL   Comment 1 Documented in Chart     Comment 2 Notify RN    GLUCOSE, CAPILLARY      Result Value Ref Range   Glucose-Capillary 152 (*) 70 - 99 mg/dL  GLUCOSE, CAPILLARY      Result Value Ref Range   Glucose-Capillary 129 (*) 70 - 99 mg/dL  GLUCOSE, CAPILLARY      Result Value Ref Range   Glucose-Capillary 143 (*) 70 - 99 mg/dL  GLUCOSE, CAPILLARY      Result Value Ref Range   Glucose-Capillary 210 (*) 70 - 99 mg/dL  VANCOMYCIN, TROUGH      Result Value Ref Range   Vancomycin Tr 9.0 (*) 10.0 - 20.0 ug/mL  GLUCOSE, CAPILLARY      Result Value Ref Range   Glucose-Capillary 195 (*) 70 - 99 mg/dL   Comment 1 Documented in Chart     Comment 2 Notify RN    GLUCOSE, CAPILLARY      Result Value Ref Range   Glucose-Capillary 153 (*) 70 - 99 mg/dL  GLUCOSE, CAPILLARY      Result Value Ref Range   Glucose-Capillary 179 (*) 70 - 99 mg/dL  CBC      Result Value Ref Range   WBC 11.1 (*) 4.0 - 10.5 K/uL   RBC 3.60 (*) 4.22 - 5.81 MIL/uL   Hemoglobin 10.4 (*) 13.0 - 17.0 g/dL   HCT 31.2 (*) 39.0 - 52.0 %    MCV 86.7  78.0 - 100.0 fL   MCH 28.9  26.0 - 34.0 pg   MCHC 33.3  30.0 - 36.0 g/dL   RDW 13.3  11.5 - 15.5 %   Platelets 714 (*) 150 - 400 K/uL  BASIC METABOLIC PANEL      Result Value Ref Range   Sodium 133 (*) 137 - 147 mEq/L   Potassium 4.4  3.7 - 5.3 mEq/L   Chloride 96  96 - 112 mEq/L   CO2 25  19 - 32 mEq/L   Glucose, Bld 185 (*) 70 - 99 mg/dL   BUN 17  6 - 23 mg/dL   Creatinine, Ser 0.77  0.50 - 1.35 mg/dL   Calcium 8.7  8.4 - 10.5 mg/dL   GFR calc non Af Amer 89 (*) >90 mL/min   GFR calc Af Amer >90  >90 mL/min  GLUCOSE, CAPILLARY      Result Value Ref Range   Glucose-Capillary 198 (*) 70 - 99 mg/dL   Comment 1 Notify RN    GLUCOSE, CAPILLARY      Result Value Ref Range   Glucose-Capillary 167 (*) 70 - 99 mg/dL  GLUCOSE, CAPILLARY      Result Value Ref Range   Glucose-Capillary 148 (*) 70 - 99 mg/dL  GLUCOSE, CAPILLARY  Result Value Ref Range   Glucose-Capillary 167 (*) 70 - 99 mg/dL  URINALYSIS, ROUTINE W REFLEX MICROSCOPIC      Result Value Ref Range   Color, Urine YELLOW  YELLOW   APPearance CLEAR  CLEAR   Specific Gravity, Urine 1.019  1.005 - 1.030   pH 6.0  5.0 - 8.0   Glucose, UA NEGATIVE  NEGATIVE mg/dL   Hgb urine dipstick NEGATIVE  NEGATIVE   Bilirubin Urine NEGATIVE  NEGATIVE   Ketones, ur NEGATIVE  NEGATIVE mg/dL   Protein, ur NEGATIVE  NEGATIVE mg/dL   Urobilinogen, UA 0.2  0.0 - 1.0 mg/dL   Nitrite NEGATIVE  NEGATIVE   Leukocytes, UA NEGATIVE  NEGATIVE  GLUCOSE, CAPILLARY      Result Value Ref Range   Glucose-Capillary 135 (*) 70 - 99 mg/dL  GLUCOSE, CAPILLARY      Result Value Ref Range   Glucose-Capillary 158 (*) 70 - 99 mg/dL  GLUCOSE, CAPILLARY      Result Value Ref Range   Glucose-Capillary 130 (*) 70 - 99 mg/dL  GLUCOSE, CAPILLARY      Result Value Ref Range   Glucose-Capillary 124 (*) 70 - 99 mg/dL  VANCOMYCIN, TROUGH      Result Value Ref Range   Vancomycin Tr 13.8  10.0 - 20.0 ug/mL  GLUCOSE, CAPILLARY      Result Value  Ref Range   Glucose-Capillary 127 (*) 70 - 99 mg/dL  CBC      Result Value Ref Range   WBC 12.8 (*) 4.0 - 10.5 K/uL   RBC 3.67 (*) 4.22 - 5.81 MIL/uL   Hemoglobin 10.5 (*) 13.0 - 17.0 g/dL   HCT 32.1 (*) 39.0 - 52.0 %   MCV 87.5  78.0 - 100.0 fL   MCH 28.6  26.0 - 34.0 pg   MCHC 32.7  30.0 - 36.0 g/dL   RDW 13.4  11.5 - 15.5 %   Platelets 675 (*) 150 - 400 K/uL  GLUCOSE, CAPILLARY      Result Value Ref Range   Glucose-Capillary 205 (*) 70 - 99 mg/dL  GLUCOSE, CAPILLARY      Result Value Ref Range   Glucose-Capillary 144 (*) 70 - 99 mg/dL  GLUCOSE, CAPILLARY      Result Value Ref Range   Glucose-Capillary 191 (*) 70 - 99 mg/dL  GLUCOSE, CAPILLARY      Result Value Ref Range   Glucose-Capillary 148 (*) 70 - 99 mg/dL  GLUCOSE, CAPILLARY      Result Value Ref Range   Glucose-Capillary 143 (*) 70 - 99 mg/dL  GLUCOSE, CAPILLARY      Result Value Ref Range   Glucose-Capillary 133 (*) 70 - 99 mg/dL   Comment 1 Notify RN      Discharge Medications:     Medication List    STOP taking these medications       aspirin 81 MG tablet     colchicine 0.6 MG tablet     ibuprofen 200 MG tablet  Commonly known as:  ADVIL,MOTRIN     predniSONE 20 MG tablet  Commonly known as:  DELTASONE      TAKE these medications       cefTRIAXone 1 g in dextrose 5 % 50 mL  Inject 1 g into the vein daily.     cyclobenzaprine 5 MG tablet  Commonly known as:  FLEXERIL  Take 1 tablet (5 mg total) by mouth 3 (three) times daily as needed for  muscle spasms.     lisinopril 20 MG tablet  Commonly known as:  PRINIVIL,ZESTRIL  Take 20 mg by mouth daily.     menthol-cetylpyridinium 3 MG lozenge  Commonly known as:  CEPACOL  Take 1 lozenge (3 mg total) by mouth as needed for sore throat.     metFORMIN 500 MG 24 hr tablet  Commonly known as:  GLUCOPHAGE-XR  Take 2,000 mg by mouth at bedtime.     oxyCODONE-acetaminophen 5-325 MG per tablet  Commonly known as:  PERCOCET/ROXICET  Take 1-2  tablets by mouth every 4 (four) hours as needed for severe pain.     Rivaroxaban 15 MG Tabs tablet  Commonly known as:  XARELTO  Take 1 tablet (15 mg total) by mouth 2 (two) times daily with a meal.     rivaroxaban 20 MG Tabs tablet  Commonly known as:  XARELTO  Take 1 tablet (20 mg total) by mouth daily with supper. Start taking 20 mg daily when the 21 days of 15 mg bid is complete     simvastatin 20 MG tablet  Commonly known as:  ZOCOR  Take 20 mg by mouth daily.        Diagnostic Studies: Dg Chest 2 View  03/29/2014   CLINICAL DATA:  Preoperative study prior to arthroscopy  EXAM: CHEST  2 VIEW  COMPARISON:  None.  FINDINGS: The lungs are adequately inflated and clear. The cardiopericardial silhouette is normal in size. The pulmonary vascularity is not engorged. The mediastinum is normal in width. There is no pleural effusion. The observed portions of the bony thorax appear normal.  IMPRESSION: There is no evidence of active cardiopulmonary disease.   Electronically Signed   By: David  Martinique   On: 03/29/2014 14:00   Dg Knee 2 Views Left  03/20/2014   CLINICAL DATA:  Bilateral knee pain  EXAM: LEFT KNEE - 1-2 VIEW  COMPARISON:  None.  FINDINGS: There is no acute fracture or dislocation. There is no significant joint effusion. There are mild tricompartmental osteoarthritic changes primarily mass manifested by marginal osteophytosis. 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  IMPRESSION: Mild tricompartmental osteoarthritis most significant in the patellofemoral compartment.   Electronically Signed   By: Kathreen Devoid   On: 03/20/2014 20:57   Dg Knee 2 Views Right  03/20/2014   CLINICAL DATA:  Bilateral knee pain  EXAM: RIGHT KNEE - 1-2 VIEW  COMPARISON:  None.  FINDINGS: There is no acute fracture or dislocation. There are tricompartmental osteoarthritic changes of the right knee most severe in the lateral femorotibial compartment. There is a moderate joint effusion. There is  peripheral vascular atherosclerotic disease.  IMPRESSION: Tricompartmental osteoarthritis of the right knee.   Electronically Signed   By: Kathreen Devoid   On: 03/20/2014 20:56    Disposition: 01-Home or Self Care  Discharge Instructions   Call MD / Call 911    Complete by:  As directed   If you experience chest pain or shortness of breath, CALL 911 and be transported to the hospital emergency room.  If you develope a fever above 101 F, pus (white drainage) or increased drainage or redness at the wound, or calf pain, call your surgeon's office.     Call MD / Call 911    Complete by:  As directed   If you experience chest pain or shortness of breath, CALL 911 and be transported to the hospital emergency room.  If you develope a fever above 101 F, pus (  white drainage) or increased drainage or redness at the wound, or calf pain, call your surgeon's office.     Constipation Prevention    Complete by:  As directed   Drink plenty of fluids.  Prune juice may be helpful.  You may use a stool softener, such as Colace (over the counter) 100 mg twice a day.  Use MiraLax (over the counter) for constipation as needed.     Constipation Prevention    Complete by:  As directed   Drink plenty of fluids.  Prune juice may be helpful.  You may use a stool softener, such as Colace (over the counter) 100 mg twice a day.  Use MiraLax (over the counter) for constipation as needed.     Diet - low sodium heart healthy    Complete by:  As directed      Diet - low sodium heart healthy    Complete by:  As directed      Diet - low sodium heart healthy    Complete by:  As directed      Discharge instructions    Complete by:  As directed   Weight bearing as tolerated with crutches Range of motion as tolerated Keep incisions dry     Discharge instructions    Complete by:  As directed   Weight bearing as tolerated BL lower extremities.  Keep incisions dry.  Knee range of motions as tolerated.     Increase activity  slowly as tolerated    Complete by:  As directed      Increase activity slowly as tolerated    Complete by:  As directed      Increase activity slowly    Complete by:  As directed               Signed: Dr. Lebron Conners, PA-C [phone] 04/05/2014, 10:15 AM

## 2014-04-04 NOTE — Progress Notes (Signed)
ANTICOAGULATION CONSULT NOTE - Initial Consult  Pharmacy Consult for rivaroxaban Indication: DVT  Not on File  Patient Measurements: Height: 5\' 8"  (172.7 cm) Weight: 200 lb 2.8 oz (90.8 kg) IBW/kg (Calculated) : 68.4 Heparin Dosing Weight:   Vital Signs: Temp: 97.9 F (36.6 C) (05/18 1327) Temp src: Oral (05/18 1327) BP: 119/63 mmHg (05/18 0517) Pulse Rate: 107 (05/18 1327)  Labs:  Recent Labs  04/02/14 0459 04/04/14 0500  HGB 10.4* 10.5*  HCT 31.2* 32.1*  PLT 714* 675*  CREATININE 0.77  --     Estimated Creatinine Clearance: 91.4 ml/min (by C-G formula based on Cr of 0.77).   Medical History: Past Medical History  Diagnosis Date  . Attention deficit disorder   . Diabetes mellitus without complication   . Hyperlipidemia   . Hypertension   . Blind left eye     Medications:  Scheduled:  . cefTRIAXone (ROCEPHIN)  IV  1 g Intravenous Q24H  . colchicine  0.6 mg Oral Daily  . insulin aspart  0-9 Units Subcutaneous TID WC  . lisinopril  20 mg Oral Daily  . metFORMIN  2,000 mg Oral QHS  . rivaroxaban   Does not apply Once  . Rivaroxaban  15 mg Oral BID WC  . simvastatin  20 mg Oral Daily    Assessment: 73 yr old was admitted with bilateral knee joint infections. Was placed on aspirin for DVT prophylaxis following his knee surgery and he developed bilateral DVT's. Pt got 6 doses of Eliquis but today the Eliquis was discontinued and Xarelto started. Goal of Therapy:   Treatment and prevention of bilateral lower leg DVTs.   Plan:  Pt received last dose of Eliquis this am. To start Xarelto 15 mg bid with food for 21 days, then start 20 mg daily.  Ardith Dark Jerold Yoss 04/04/2014,5:13 PM

## 2014-04-04 NOTE — Progress Notes (Signed)
[facility] PROGRESS NOTE   MACKINLEY KIEHN FUX:[id number] DOB: 05-Aug-1941 DOA: 03/29/2014 PCP: Tawanna Solo, MD  HPI/Subjective: No complaints.  Daughter at bedside.  Assessment/Plan: Active Problems:   Essential hypertension   Diabetes mellitus type II, controlled   Bacterial infection of knee joint   DVT of lower extremity, bilateral   Acute DVT  -Acute DVT, bilateral of lower extremities.  -Patient educated about blood thinners adverse effects including minor or major bleeding and bruisability.  -Patient denies any shortness of breath or chest pain, very minimal suspicion for PE.  -Started on Eliquis.  Septic arthritis  -Per Dr. Marlou Sa, status post washing, vancomycin and Rocephin for total of 4 weeks per Dr. Johnnye Sima.  -I will discontinue the prednisone, this is started by ED physician and 03/24/2014 for knee swelling.   Type 2 diabetes mellitus  -Carb modified diet, SSI as an inpatient -Hemoglobin A1c is 7.5 which consisted with hemoglobin A1c of 169.  -resume metformin at discharge.  Recommend PCP follow up for on going DM management  HTN  -Continue home medications   Code Status: Full code Family Communication: Plan discussed with the patient. Disposition Plan: For possible discharge today.    Procedures:  Washing of both knees  Antibiotics:  Vancomycin and Rocephin   Objective: Filed Vitals:   04/04/14 0517  BP: 119/63  Pulse: 91  Temp: 99 F (37.2 C)  Resp: 16    Intake/Output Summary (Last 24 hours) at 04/04/14 1218 Last data filed at 04/03/14 1854  Gross per 24 hour  Intake    440 ml  Output    850 ml  Net   -410 ml   Filed Weights   03/29/14 1319 03/29/14 1921  Weight: 90.719 kg (200 lb) 90.8 kg (200 lb 2.8 oz)    Exam: General: Alert and awake, oriented x3, not in any acute distress. HEENT: anicteric sclera, pupils reactive to light and accommodation, EOMI CVS: S1-S2 clear, no murmur rubs or gallops Chest: clear to  auscultation bilaterally, no wheezing, rales or rhonchi Abdomen: soft nontender, nondistended, normal bowel sounds, no organomegaly Extremities: no cyanosis, clubbing or edema noted bilaterally Neuro: Cranial nerves II-XII intact, no focal neurological deficits  Data Reviewed: Basic Metabolic Panel:  Recent Labs Lab 03/31/14 0550 04/02/14 0459  NA 136* 133*  K 4.9 4.4  CL 99 96  CO2 26 25  GLUCOSE 170* 185*  BUN 17 17  CREATININE 0.80 0.77  CALCIUM 9.0 8.7   CBC:  Recent Labs Lab 04/02/14 0459 04/04/14 0500  WBC 11.1* 12.8*  HGB 10.4* 10.5*  HCT 31.2* 32.1*  MCV 86.7 87.5  PLT 714* 675*   CBG:  Recent Labs Lab 04/03/14 1159 04/03/14 1655 04/03/14 2149 04/04/14 0804 04/04/14 1149  GLUCAP 124* 127* 205* 144* 191*    Micro Recent Results (from the past 240 hour(s))  ANAEROBIC CULTURE     Status: None   Collection Time    03/29/14  4:01 PM      Result Value Ref Range Status   Specimen Description SYNOVIAL FLUID KNEE LEFT   Final   Special Requests 10ML FLUID   Final   Gram Stain     Final   Value: ABUNDANT WBC PRESENT, PREDOMINANTLY PMN     NO ORGANISMS SEEN     Performed at [facility]   Culture     Final   Value: NO ANAEROBES ISOLATED     Performed at [facility]   Report Status 04/03/2014 FINAL  Final  BODY FLUID CULTURE     Status: None   Collection Time    03/29/14  4:01 PM      Result Value Ref Range Status   Specimen Description SYNOVIAL FLUID KNEE LEFT   Final   Special Requests 10ML FLUID   Final   Gram Stain     Final   Value: ABUNDANT WBC PRESENT, PREDOMINANTLY PMN     NO ORGANISMS SEEN     Performed at [facility]   Culture     Final   Value: NO GROWTH 3 DAYS     Performed at [facility]   Report Status 04/02/2014 FINAL   Final  ANAEROBIC CULTURE     Status: None   Collection Time    03/29/14  4:23 PM      Result Value Ref Range Status   Specimen Description FLUID SYNOVIAL RIGHT KNEE   Final    Special Requests SYRINGE   Final   Gram Stain     Final   Value: ABUNDANT WBC PRESENT, PREDOMINANTLY PMN     NO ORGANISMS SEEN     Performed at [facility]   Culture     Final   Value: NO ANAEROBES ISOLATED     Performed at [facility]   Report Status 04/03/2014 FINAL   Final  BODY FLUID CULTURE     Status: None   Collection Time    03/29/14  4:23 PM      Result Value Ref Range Status   Specimen Description FLUID SYNOVIAL RIGHT KNEE   Final   Special Requests SYRINGE   Final   Gram Stain     Final   Value: ABUNDANT WBC PRESENT, PREDOMINANTLY PMN     NO ORGANISMS SEEN     Performed at [facility]   Culture     Final   Value: NO GROWTH 3 DAYS     Performed at [facility]   Report Status 04/02/2014 FINAL   Final  ANAEROBIC CULTURE     Status: None   Collection Time    03/29/14  4:25 PM      Result Value Ref Range Status   Specimen Description FLUID SYNOVIAL RIGHT KNEE   Final   Special Requests ON SWAB   Final   Gram Stain     Final   Value: ABUNDANT WBC PRESENT, PREDOMINANTLY PMN     NO ORGANISMS SEEN     Performed at [facility]   Culture     Final   Value: NO ANAEROBES ISOLATED     Performed at [facility]   Report Status 04/03/2014 FINAL   Final  BODY FLUID CULTURE     Status: None   Collection Time    03/29/14  4:25 PM      Result Value Ref Range Status   Specimen Description FLUID SYNOVIAL RIGHT KNEE   Final   Special Requests ON SWAB   Final   Gram Stain     Final   Value: ABUNDANT WBC PRESENT, PREDOMINANTLY PMN     NO ORGANISMS SEEN     Performed at [facility]   Culture     Final   Value: RARE VIRIDANS STREPTOCOCCUS     Performed at [facility]   Report Status 04/04/2014 FINAL   Final   Organism ID, Bacteria VIRIDANS STREPTOCOCCUS   Final  CULTURE, BLOOD (ROUTINE X 2)  Status: None   Collection Time    04/03/14 12:45 PM      Result Value Ref Range Status   Specimen Description  BLOOD LEFT ARM   Final   Special Requests BOTTLES DRAWN AEROBIC ONLY 10CC   Final   Culture  Setup Time     Final   Value: 04/03/2014 22:02     Performed at [facility]   Culture     Final   Value:        BLOOD CULTURE RECEIVED NO GROWTH TO DATE CULTURE WILL BE HELD FOR 5 DAYS BEFORE ISSUING A FINAL NEGATIVE REPORT     Performed at [facility]   Report Status PENDING   Incomplete  CULTURE, BLOOD (ROUTINE X 2)     Status: None   Collection Time    04/03/14 12:50 PM      Result Value Ref Range Status   Specimen Description BLOOD LEFT ARM   Final   Special Requests BOTTLES DRAWN AEROBIC ONLY 10CC   Final   Culture  Setup Time     Final   Value: 04/03/2014 22:02     Performed at [facility]   Culture     Final   Value:        BLOOD CULTURE RECEIVED NO GROWTH TO DATE CULTURE WILL BE HELD FOR 5 DAYS BEFORE ISSUING A FINAL NEGATIVE REPORT     Performed at [facility]   Report Status PENDING   Incomplete     Studies: No results found.  Scheduled Meds: . apixaban  10 mg Oral BID  . [START ON 04/09/2014] apixaban  5 mg Oral BID  . cefTRIAXone (ROCEPHIN)  IV  1 g Intravenous Q24H  . colchicine  0.6 mg Oral Daily  . insulin aspart  0-9 Units Subcutaneous TID WC  . lisinopril  20 mg Oral Daily  . metFORMIN  2,000 mg Oral QHS  . simvastatin  20 mg Oral Daily  . vancomycin  1,500 mg Intravenous Q12H   Continuous Infusions:      Time spent: 35 minutes    Karen Kitchens  [facility] Pager [phone] If 7PM-7AM, please contact night-coverage at [email or web address], password [facility] 04/04/2014, 12:18 PM  LOS: 6 days

## 2014-04-05 DIAGNOSIS — M25469 Effusion, unspecified knee: Secondary | ICD-10-CM | POA: Diagnosis not present

## 2014-04-05 DIAGNOSIS — R443 Hallucinations, unspecified: Secondary | ICD-10-CM | POA: Diagnosis not present

## 2014-04-05 DIAGNOSIS — M62838 Other muscle spasm: Secondary | ICD-10-CM | POA: Diagnosis not present

## 2014-04-05 DIAGNOSIS — S8990XA Unspecified injury of unspecified lower leg, initial encounter: Secondary | ICD-10-CM | POA: Diagnosis not present

## 2014-04-05 DIAGNOSIS — R262 Difficulty in walking, not elsewhere classified: Secondary | ICD-10-CM | POA: Diagnosis not present

## 2014-04-05 DIAGNOSIS — H5440 Blindness, one eye, unspecified eye: Secondary | ICD-10-CM | POA: Diagnosis present

## 2014-04-05 DIAGNOSIS — I10 Essential (primary) hypertension: Secondary | ICD-10-CM | POA: Diagnosis not present

## 2014-04-05 DIAGNOSIS — E785 Hyperlipidemia, unspecified: Secondary | ICD-10-CM | POA: Diagnosis present

## 2014-04-05 DIAGNOSIS — D509 Iron deficiency anemia, unspecified: Secondary | ICD-10-CM | POA: Diagnosis present

## 2014-04-05 DIAGNOSIS — Z5189 Encounter for other specified aftercare: Secondary | ICD-10-CM | POA: Diagnosis not present

## 2014-04-05 DIAGNOSIS — M79609 Pain in unspecified limb: Secondary | ICD-10-CM | POA: Diagnosis not present

## 2014-04-05 DIAGNOSIS — G9340 Encephalopathy, unspecified: Secondary | ICD-10-CM | POA: Diagnosis not present

## 2014-04-05 DIAGNOSIS — G8929 Other chronic pain: Secondary | ICD-10-CM | POA: Diagnosis not present

## 2014-04-05 DIAGNOSIS — F988 Other specified behavioral and emotional disorders with onset usually occurring in childhood and adolescence: Secondary | ICD-10-CM | POA: Diagnosis present

## 2014-04-05 DIAGNOSIS — I82409 Acute embolism and thrombosis of unspecified deep veins of unspecified lower extremity: Secondary | ICD-10-CM | POA: Diagnosis present

## 2014-04-05 DIAGNOSIS — Z7901 Long term (current) use of anticoagulants: Secondary | ICD-10-CM | POA: Diagnosis not present

## 2014-04-05 DIAGNOSIS — Z8249 Family history of ischemic heart disease and other diseases of the circulatory system: Secondary | ICD-10-CM | POA: Diagnosis not present

## 2014-04-05 DIAGNOSIS — R4182 Altered mental status, unspecified: Secondary | ICD-10-CM | POA: Diagnosis not present

## 2014-04-05 DIAGNOSIS — M24569 Contracture, unspecified knee: Secondary | ICD-10-CM | POA: Diagnosis present

## 2014-04-05 DIAGNOSIS — E119 Type 2 diabetes mellitus without complications: Secondary | ICD-10-CM | POA: Diagnosis not present

## 2014-04-05 DIAGNOSIS — Z87891 Personal history of nicotine dependence: Secondary | ICD-10-CM | POA: Diagnosis not present

## 2014-04-05 DIAGNOSIS — Z4789 Encounter for other orthopedic aftercare: Secondary | ICD-10-CM | POA: Diagnosis not present

## 2014-04-05 DIAGNOSIS — M009 Pyogenic arthritis, unspecified: Secondary | ICD-10-CM | POA: Diagnosis present

## 2014-04-05 DIAGNOSIS — M1710 Unilateral primary osteoarthritis, unspecified knee: Secondary | ICD-10-CM | POA: Diagnosis not present

## 2014-04-05 DIAGNOSIS — E43 Unspecified severe protein-calorie malnutrition: Secondary | ICD-10-CM | POA: Diagnosis present

## 2014-04-05 DIAGNOSIS — M25569 Pain in unspecified knee: Secondary | ICD-10-CM | POA: Diagnosis not present

## 2014-04-05 DIAGNOSIS — Z86718 Personal history of other venous thrombosis and embolism: Secondary | ICD-10-CM | POA: Diagnosis not present

## 2014-04-05 DIAGNOSIS — I739 Peripheral vascular disease, unspecified: Secondary | ICD-10-CM | POA: Diagnosis present

## 2014-04-05 DIAGNOSIS — D62 Acute posthemorrhagic anemia: Secondary | ICD-10-CM | POA: Diagnosis not present

## 2014-04-05 DIAGNOSIS — M6250 Muscle wasting and atrophy, not elsewhere classified, unspecified site: Secondary | ICD-10-CM | POA: Diagnosis not present

## 2014-04-05 DIAGNOSIS — IMO0002 Reserved for concepts with insufficient information to code with codable children: Secondary | ICD-10-CM | POA: Diagnosis not present

## 2014-04-05 LAB — CBC
HCT: 31.6 % — ABNORMAL LOW (ref 39.0–52.0)
Hemoglobin: 10.5 g/dL — ABNORMAL LOW (ref 13.0–17.0)
MCH: 28.8 pg (ref 26.0–34.0)
MCHC: 33.2 g/dL (ref 30.0–36.0)
MCV: 86.8 fL (ref 78.0–100.0)
PLATELETS: 696 10*3/uL — AB (ref 150–400)
RBC: 3.64 MIL/uL — ABNORMAL LOW (ref 4.22–5.81)
RDW: 13.8 % (ref 11.5–15.5)
WBC: 12.5 10*3/uL — AB (ref 4.0–10.5)

## 2014-04-05 LAB — GLUCOSE, CAPILLARY
GLUCOSE-CAPILLARY: 133 mg/dL — AB (ref 70–99)
Glucose-Capillary: 151 mg/dL — ABNORMAL HIGH (ref 70–99)

## 2014-04-05 MED ORDER — DEXTROSE 5 % IV SOLN: 1.0000 g | INTRAVENOUS | Status: DC

## 2014-04-05 MED ORDER — RIVAROXABAN 20 MG PO TABS: 20.0000 mg | ORAL_TABLET | Freq: Every day | ORAL | Status: DC

## 2014-04-05 MED ORDER — HEPARIN SOD (PORK) LOCK FLUSH 100 UNIT/ML IV SOLN
250.0000 [IU] | INTRAVENOUS | Status: AC | PRN
  Administered 2014-04-05: 250 [IU]

## 2014-04-05 MED ORDER — RIVAROXABAN 15 MG PO TABS: 15.0000 mg | ORAL_TABLET | Freq: Two times a day (BID) | ORAL | Status: DC

## 2014-04-05 MED ORDER — MENTHOL 3 MG MT LOZG
1.0000 | LOZENGE | OROMUCOSAL | Status: DC | PRN
  Administered 2014-04-05: 3 mg via ORAL
  Filled 2014-04-05: qty 9

## 2014-04-05 MED ORDER — OXYCODONE-ACETAMINOPHEN 5-325 MG PO TABS: 1.0000 | ORAL_TABLET | ORAL | Status: DC | PRN

## 2014-04-05 MED ORDER — MENTHOL 3 MG MT LOZG: 1.0000 | LOZENGE | OROMUCOSAL | Status: DC | PRN

## 2014-04-05 NOTE — Progress Notes (Signed)
Physical Therapy Treatment Patient Details Name: [name] MRN: [id number] DOB: 08/08/1941 Today's Date: 04/05/2014    History of Present Illness Pt is a 73 y.o. male s/p Right and left knee arthroscopy  and debridement; with Lt knee aspiration.  Bil. DVT.    PT Comments    Patient continues to be very limited by pain. Family and patient awaiting SNF placement at this time. Will continue with current POC  Follow Up Recommendations  SNF;Supervision/Assistance - 24 hour     Equipment Recommendations  None recommended by PT    Recommendations for Other Services       Precautions / Restrictions Precautions Precautions: Fall    Mobility  Bed Mobility Overal bed mobility: Needs Assistance Bed Mobility: Supine to Sit     Supine to sit: HOB elevated;Mod assist;+2 for physical assistance     General bed mobility comments: Pt required mod-A +2 to help move bil. LE over EOB and help to raise trunk up to vertical for supine to sit. A with use of chuck pad to scoot EOB  Transfers Overall transfer level: Needs assistance Equipment used: Rolling walker (2 wheeled) Transfers: Squat Pivot Transfers Sit to Stand: Max assist;+2 physical assistance        Lateral/Scoot Transfers: +2 physical assistance;Total assist General transfer comment: Patient unable to come to full stand with two attempts. Required total A for squat pivot. Unable to extend legs and remained in flex position. Patient fearful of falling  Ambulation/Gait                 Stairs            Wheelchair Mobility    Modified Rankin (Stroke Patients Only)       Balance                                    Cognition Arousal/Alertness: Awake/alert Behavior During Therapy: WFL for tasks assessed/performed Overall Cognitive Status: Within Functional Limits for tasks assessed                      Exercises      General Comments        Pertinent Vitals/Pain 9/10 B  knee. RN provided medication to assist with pain control     Home Living                      Prior Function            PT Goals (current goals can now be found in the care plan section) Progress towards PT goals: Progressing toward goals    Frequency  Min 3X/week    PT Plan Current plan remains appropriate    Co-evaluation             End of Session Equipment Utilized During Treatment: Gait belt Activity Tolerance: Patient limited by pain Patient left: in chair;with chair alarm set;with call bell/phone within reach;with family/visitor present     Time: 0937-1000 PT Time Calculation (min): 23 min  Charges:  $Therapeutic Activity: 23-37 mins                    G Codes:      Tonia Brooms Robinette 04/05/2014, 10:22 AM 04/05/2014 [place] PTA [phone] pager [phone] office

## 2014-04-05 NOTE — Clinical Social Work Placement (Signed)
Clinical Social Work Department CLINICAL SOCIAL WORK PLACEMENT NOTE 04/05/2014  Patient:  [name], [name]  Account Number:  [id number] [place] date:  03/29/2014  Clinical Social Worker:  Berton Mount, Latanya Presser  Date/time:  04/01/2014 01:00 PM  Clinical Social Work is seeking post-discharge placement for this patient at the following level of care:   [place]   (*CSW will update this form in Epic as items are completed)   04/01/2014  Patient/family provided with [place] Department of Clinical Social Work's list of facilities offering this level of care within the geographic area requested by the patient (or if unable, by the patient's family).  04/01/2014  Patient/family informed of their freedom to choose among providers that offer the needed level of care, that participate in Medicare, Medicaid or managed care program needed by the patient, have an available bed and are willing to accept the patient.  04/01/2014  Patient/family informed of [facility]' ownership interest in [facility], as well as of the fact that they are under no obligation to receive care at this facility.  PASARR submitted to EDS on 04/01/2014 PASARR number received from EDS on 04/01/2014  FL2 transmitted to all facilities in geographic area requested by pt/family on  04/01/2014 FL2 transmitted to all facilities within larger geographic area on   Patient informed that his/her managed care company has contracts with or will negotiate with  certain facilities, including the following:     Patient/family informed of bed offers received:  04/04/2014 Patient chooses bed at [facility] Physician recommends and patient chooses bed at    Patient to be transferred to [facility] on  04/05/2014 Patient to be transferred to facility by Ambulance  The following physician request were entered in Epic:   Additional Comments: Per MD patient ready to DC to [facility]. RN, patient,  patient's family, and facility notified of DC. RN given niumber for report. DC packet on chart. Ambulance transport requested. CSW signing off.    Liz Beach MSW, [place], [place], [phone]

## 2014-04-05 NOTE — Progress Notes (Signed)
Pt discharged to snf with skin intact, alert and oriented x4. Report called to receiving facility RN.Caraline Deutschman,RN5/19/2015 6:34 PM

## 2014-04-06 DIAGNOSIS — M79609 Pain in unspecified limb: Secondary | ICD-10-CM | POA: Diagnosis not present

## 2014-04-06 DIAGNOSIS — R262 Difficulty in walking, not elsewhere classified: Secondary | ICD-10-CM | POA: Diagnosis not present

## 2014-04-08 DIAGNOSIS — M25569 Pain in unspecified knee: Secondary | ICD-10-CM | POA: Diagnosis not present

## 2014-04-09 LAB — CULTURE, BLOOD (ROUTINE X 2)
CULTURE: NO GROWTH
Culture: NO GROWTH

## 2014-04-11 DIAGNOSIS — M6250 Muscle wasting and atrophy, not elsewhere classified, unspecified site: Secondary | ICD-10-CM | POA: Diagnosis not present

## 2014-04-11 DIAGNOSIS — E119 Type 2 diabetes mellitus without complications: Secondary | ICD-10-CM | POA: Diagnosis not present

## 2014-04-11 DIAGNOSIS — I10 Essential (primary) hypertension: Secondary | ICD-10-CM | POA: Diagnosis not present

## 2014-04-11 DIAGNOSIS — G8929 Other chronic pain: Secondary | ICD-10-CM | POA: Diagnosis not present

## 2014-04-13 DIAGNOSIS — R443 Hallucinations, unspecified: Secondary | ICD-10-CM | POA: Diagnosis not present

## 2014-04-13 DIAGNOSIS — M25569 Pain in unspecified knee: Secondary | ICD-10-CM | POA: Diagnosis not present

## 2014-04-13 DIAGNOSIS — R262 Difficulty in walking, not elsewhere classified: Secondary | ICD-10-CM | POA: Diagnosis not present

## 2014-04-13 DIAGNOSIS — M62838 Other muscle spasm: Secondary | ICD-10-CM | POA: Diagnosis not present

## 2014-04-13 DIAGNOSIS — I82409 Acute embolism and thrombosis of unspecified deep veins of unspecified lower extremity: Secondary | ICD-10-CM | POA: Diagnosis not present

## 2014-04-13 DIAGNOSIS — M009 Pyogenic arthritis, unspecified: Secondary | ICD-10-CM | POA: Diagnosis not present

## 2014-04-13 DIAGNOSIS — R4182 Altered mental status, unspecified: Secondary | ICD-10-CM | POA: Diagnosis not present

## 2014-04-15 DIAGNOSIS — M25569 Pain in unspecified knee: Secondary | ICD-10-CM | POA: Diagnosis not present

## 2014-04-15 DIAGNOSIS — R262 Difficulty in walking, not elsewhere classified: Secondary | ICD-10-CM | POA: Diagnosis not present

## 2014-04-18 DIAGNOSIS — M25569 Pain in unspecified knee: Secondary | ICD-10-CM | POA: Diagnosis not present

## 2014-04-18 DIAGNOSIS — M62838 Other muscle spasm: Secondary | ICD-10-CM | POA: Diagnosis not present

## 2014-04-20 DIAGNOSIS — M1710 Unilateral primary osteoarthritis, unspecified knee: Secondary | ICD-10-CM | POA: Diagnosis not present

## 2014-04-25 ENCOUNTER — Inpatient Hospital Stay (HOSPITAL_COMMUNITY)
Admission: RE | Admit: 2014-04-25 | Discharge: 2014-05-02 | DRG: 485 | Disposition: A | Discharge status: Skilled Nursing Facility | Payer: Medicare Other | Source: Ambulatory Visit | Attending: Family Medicine | Admitting: Family Medicine

## 2014-04-25 DIAGNOSIS — R7881 Bacteremia: Secondary | ICD-10-CM | POA: Diagnosis not present

## 2014-04-25 DIAGNOSIS — E119 Type 2 diabetes mellitus without complications: Secondary | ICD-10-CM | POA: Diagnosis present

## 2014-04-25 DIAGNOSIS — M009 Pyogenic arthritis, unspecified: Secondary | ICD-10-CM | POA: Diagnosis present

## 2014-04-25 DIAGNOSIS — M90869 Osteopathy in diseases classified elsewhere, unspecified lower leg: Secondary | ICD-10-CM | POA: Diagnosis not present

## 2014-04-25 DIAGNOSIS — T398X5A Adverse effect of other nonopioid analgesics and antipyretics, not elsewhere classified, initial encounter: Secondary | ICD-10-CM | POA: Diagnosis not present

## 2014-04-25 DIAGNOSIS — I369 Nonrheumatic tricuspid valve disorder, unspecified: Secondary | ICD-10-CM | POA: Diagnosis not present

## 2014-04-25 DIAGNOSIS — M24569 Contracture, unspecified knee: Secondary | ICD-10-CM | POA: Diagnosis present

## 2014-04-25 DIAGNOSIS — I82409 Acute embolism and thrombosis of unspecified deep veins of unspecified lower extremity: Secondary | ICD-10-CM | POA: Diagnosis present

## 2014-04-25 DIAGNOSIS — M25469 Effusion, unspecified knee: Secondary | ICD-10-CM | POA: Diagnosis not present

## 2014-04-25 DIAGNOSIS — D62 Acute posthemorrhagic anemia: Secondary | ICD-10-CM

## 2014-04-25 DIAGNOSIS — D509 Iron deficiency anemia, unspecified: Secondary | ICD-10-CM | POA: Diagnosis present

## 2014-04-25 DIAGNOSIS — N281 Cyst of kidney, acquired: Secondary | ICD-10-CM | POA: Diagnosis not present

## 2014-04-25 DIAGNOSIS — E785 Hyperlipidemia, unspecified: Secondary | ICD-10-CM | POA: Diagnosis present

## 2014-04-25 DIAGNOSIS — Z87891 Personal history of nicotine dependence: Secondary | ICD-10-CM | POA: Diagnosis not present

## 2014-04-25 DIAGNOSIS — I82403 Acute embolism and thrombosis of unspecified deep veins of lower extremity, bilateral: Secondary | ICD-10-CM | POA: Diagnosis present

## 2014-04-25 DIAGNOSIS — G9340 Encephalopathy, unspecified: Secondary | ICD-10-CM | POA: Diagnosis not present

## 2014-04-25 DIAGNOSIS — Z7901 Long term (current) use of anticoagulants: Secondary | ICD-10-CM | POA: Diagnosis not present

## 2014-04-25 DIAGNOSIS — F3289 Other specified depressive episodes: Secondary | ICD-10-CM | POA: Diagnosis not present

## 2014-04-25 DIAGNOSIS — L089 Local infection of the skin and subcutaneous tissue, unspecified: Secondary | ICD-10-CM | POA: Diagnosis not present

## 2014-04-25 DIAGNOSIS — Z8249 Family history of ischemic heart disease and other diseases of the circulatory system: Secondary | ICD-10-CM

## 2014-04-25 DIAGNOSIS — M00869 Arthritis due to other bacteria, unspecified knee: Secondary | ICD-10-CM | POA: Diagnosis present

## 2014-04-25 DIAGNOSIS — F988 Other specified behavioral and emotional disorders with onset usually occurring in childhood and adolescence: Secondary | ICD-10-CM | POA: Diagnosis present

## 2014-04-25 DIAGNOSIS — E43 Unspecified severe protein-calorie malnutrition: Secondary | ICD-10-CM | POA: Insufficient documentation

## 2014-04-25 DIAGNOSIS — IMO0002 Reserved for concepts with insufficient information to code with codable children: Secondary | ICD-10-CM | POA: Diagnosis not present

## 2014-04-25 DIAGNOSIS — T3995XA Adverse effect of unspecified nonopioid analgesic, antipyretic and antirheumatic, initial encounter: Secondary | ICD-10-CM

## 2014-04-25 DIAGNOSIS — J984 Other disorders of lung: Secondary | ICD-10-CM | POA: Diagnosis not present

## 2014-04-25 DIAGNOSIS — M659 Synovitis and tenosynovitis, unspecified: Secondary | ICD-10-CM | POA: Diagnosis not present

## 2014-04-25 DIAGNOSIS — Z4789 Encounter for other orthopedic aftercare: Secondary | ICD-10-CM | POA: Diagnosis not present

## 2014-04-25 DIAGNOSIS — I10 Essential (primary) hypertension: Secondary | ICD-10-CM | POA: Diagnosis present

## 2014-04-25 DIAGNOSIS — Z86718 Personal history of other venous thrombosis and embolism: Secondary | ICD-10-CM | POA: Diagnosis not present

## 2014-04-25 DIAGNOSIS — M25569 Pain in unspecified knee: Secondary | ICD-10-CM | POA: Diagnosis not present

## 2014-04-25 DIAGNOSIS — F329 Major depressive disorder, single episode, unspecified: Secondary | ICD-10-CM | POA: Diagnosis not present

## 2014-04-25 DIAGNOSIS — Z5189 Encounter for other specified aftercare: Secondary | ICD-10-CM | POA: Diagnosis not present

## 2014-04-25 DIAGNOSIS — I739 Peripheral vascular disease, unspecified: Secondary | ICD-10-CM | POA: Diagnosis present

## 2014-04-25 DIAGNOSIS — H5440 Blindness, one eye, unspecified eye: Secondary | ICD-10-CM | POA: Diagnosis not present

## 2014-04-25 LAB — GLUCOSE, CAPILLARY
Glucose-Capillary: 104 mg/dL — ABNORMAL HIGH (ref 70–99)
Glucose-Capillary: 142 mg/dL — ABNORMAL HIGH (ref 70–99)

## 2014-04-25 LAB — CBC
HCT: 25.4 % — ABNORMAL LOW (ref 39.0–52.0)
Hemoglobin: 8.1 g/dL — ABNORMAL LOW (ref 13.0–17.0)
MCH: 27 pg (ref 26.0–34.0)
MCHC: 31.9 g/dL (ref 30.0–36.0)
MCV: 84.7 fL (ref 78.0–100.0)
PLATELETS: 514 10*3/uL — AB (ref 150–400)
RBC: 3 MIL/uL — ABNORMAL LOW (ref 4.22–5.81)
RDW: 14.4 % (ref 11.5–15.5)
WBC: 8.7 10*3/uL (ref 4.0–10.5)

## 2014-04-25 LAB — COMPREHENSIVE METABOLIC PANEL
ALBUMIN: 1.9 g/dL — AB (ref 3.5–5.2)
ALK PHOS: 97 U/L (ref 39–117)
ALT: 35 U/L (ref 0–53)
AST: 47 U/L — ABNORMAL HIGH (ref 0–37)
BILIRUBIN TOTAL: 0.2 mg/dL — AB (ref 0.3–1.2)
BUN: 11 mg/dL (ref 6–23)
CHLORIDE: 96 meq/L (ref 96–112)
CO2: 27 mEq/L (ref 19–32)
Calcium: 9.2 mg/dL (ref 8.4–10.5)
Creatinine, Ser: 0.66 mg/dL (ref 0.50–1.35)
GFR calc Af Amer: >90 mL/min (ref >90)
GFR calc non Af Amer: >90 mL/min (ref >90)
Glucose, Bld: 120 mg/dL — ABNORMAL HIGH (ref 70–99)
POTASSIUM: 4.3 meq/L (ref 3.7–5.3)
Sodium: 134 mEq/L — ABNORMAL LOW (ref 137–147)
Total Protein: 7.3 g/dL (ref 6.0–8.3)

## 2014-04-25 LAB — HEPARIN LEVEL (UNFRACTIONATED): Heparin Unfractionated: 0.65 IU/mL (ref 0.30–0.70)

## 2014-04-25 LAB — APTT: APTT: 50 s — AB (ref 24–37)

## 2014-04-25 MED ORDER — SODIUM CHLORIDE 0.9 % IV SOLN: 250.0000 mL | INTRAVENOUS | Status: DC | PRN

## 2014-04-25 MED ORDER — CYCLOBENZAPRINE HCL 10 MG PO TABS: 5.0000 mg | ORAL_TABLET | Freq: Three times a day (TID) | ORAL | Status: DC | PRN

## 2014-04-25 MED ORDER — HYDROCODONE-ACETAMINOPHEN 10-325 MG PO TABS
1.0000 | ORAL_TABLET | ORAL | Status: DC | PRN
  Administered 2014-04-25 – 2014-04-26 (×2): 1 via ORAL
  Filled 2014-04-25 (×2): qty 1

## 2014-04-25 MED ORDER — LISINOPRIL 20 MG PO TABS
20.0000 mg | ORAL_TABLET | Freq: Every day | ORAL | Status: DC
  Administered 2014-04-25 – 2014-05-02 (×7): 20 mg via ORAL
  Filled 2014-04-25 (×8): qty 1

## 2014-04-25 MED ORDER — SODIUM CHLORIDE 0.9 % IJ SOLN: 3.0000 mL | Freq: Two times a day (BID) | INTRAMUSCULAR | Status: DC

## 2014-04-25 MED ORDER — ONDANSETRON HCL 4 MG/2ML IJ SOLN: 4.0000 mg | Freq: Four times a day (QID) | INTRAMUSCULAR | Status: DC | PRN

## 2014-04-25 MED ORDER — INSULIN ASPART 100 UNIT/ML ~~LOC~~ SOLN: 0.0000 [IU] | Freq: Every day | SUBCUTANEOUS | Status: DC

## 2014-04-25 MED ORDER — OXYCODONE-ACETAMINOPHEN 5-325 MG PO TABS: 1.0000 | ORAL_TABLET | ORAL | Status: DC | PRN

## 2014-04-25 MED ORDER — HEPARIN (PORCINE) IN NACL 100-0.45 UNIT/ML-% IJ SOLN
1200.0000 [IU]/h | INTRAMUSCULAR | Status: DC
  Administered 2014-04-25: 1200 [IU]/h via INTRAVENOUS
  Filled 2014-04-25 (×2): qty 250

## 2014-04-25 MED ORDER — BACLOFEN 10 MG PO TABS
10.0000 mg | ORAL_TABLET | Freq: Three times a day (TID) | ORAL | Status: DC
  Administered 2014-04-25 – 2014-05-02 (×16): 10 mg via ORAL
  Filled 2014-04-25 (×22): qty 1

## 2014-04-25 MED ORDER — SODIUM CHLORIDE 0.9 % IJ SOLN: 3.0000 mL | INTRAMUSCULAR | Status: DC | PRN

## 2014-04-25 MED ORDER — ONDANSETRON HCL 4 MG PO TABS: 4.0000 mg | ORAL_TABLET | Freq: Four times a day (QID) | ORAL | Status: DC | PRN

## 2014-04-25 MED ORDER — SODIUM CHLORIDE 0.9 % IJ SOLN
10.0000 mL | INTRAMUSCULAR | Status: DC | PRN
  Administered 2014-04-25: 20 mL
  Administered 2014-04-27 – 2014-04-29 (×3): 10 mL
  Administered 2014-04-29: 20 mL
  Administered 2014-04-29 – 2014-05-02 (×5): 10 mL

## 2014-04-25 MED ORDER — SIMVASTATIN 20 MG PO TABS
20.0000 mg | ORAL_TABLET | Freq: Every day | ORAL | Status: DC
  Administered 2014-04-25 – 2014-04-30 (×6): 20 mg via ORAL
  Filled 2014-04-25 (×8): qty 1

## 2014-04-25 MED ORDER — INSULIN ASPART 100 UNIT/ML ~~LOC~~ SOLN
0.0000 [IU] | Freq: Three times a day (TID) | SUBCUTANEOUS | Status: DC
  Administered 2014-04-26 – 2014-04-27 (×2): 3 [IU] via SUBCUTANEOUS
  Administered 2014-04-27: 2 [IU] via SUBCUTANEOUS
  Administered 2014-04-27: 5 [IU] via SUBCUTANEOUS
  Administered 2014-04-28: 3 [IU] via SUBCUTANEOUS
  Administered 2014-04-28: 5 [IU] via SUBCUTANEOUS
  Administered 2014-04-29: 2 [IU] via SUBCUTANEOUS
  Administered 2014-04-29 – 2014-04-30 (×3): 3 [IU] via SUBCUTANEOUS
  Administered 2014-05-01 – 2014-05-02 (×2): 2 [IU] via SUBCUTANEOUS

## 2014-04-25 MED ORDER — MENTHOL 3 MG MT LOZG: 1.0000 | LOZENGE | OROMUCOSAL | Status: DC | PRN

## 2014-04-25 NOTE — Progress Notes (Signed)
[name] is a patient who comes in today for medical management prior to bilateral knee surgery. 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 U. re\re presented to clinic with persistent swelling in the knees and aspiration demonstrated persistently elevated white count over 50,000 in both knees. The patient has not had any fever and chills but has reported persistent pain in the right knee along with a flexion contracture.  On exam his pedal pulses are palpable he does have an effusion in both knees flexion contracture of 50-55 on the right knee the left knee only has a flexion contracture of about 10. No proximal lymphadenopathy.  Laboratory values today show white count elevated at that 12,000 platelets elevated at 690,000 began white count in the knee fluid is elevated over 50,000 in both knees. Patient was also rolled so last dose Sunday morning did not take it this morning. Plan for admission with initiation of heparin therapy with surgery to occur in the morning.  Plan for right knee is open arthrotomy open synovectomy potential manipulation with release in or to achieve more extension along with antibiotic bead placement. For the left knee plan is open arthrotomy synovectomy and MIP placement. Risk and benefits are discussed with the patient including but not limited to persistent infection nonhealing of the wound because his albumin is low at 2.4 along with other perioperative complications. Patient understands and wishes to proceed all questions answered

## 2014-04-25 NOTE — Progress Notes (Signed)
I spoke with [name], patient's nurse at [facility].  [name] reported that she was informed to not give any Xarelto starting today. [name] stated that patient's wife and daughter will be transferring patient to the hospital and it is her understanding that patient may be admitted to the hospital today.   I called Dr Randel Pigg office and it was confirmed that they are trying to admit [name] today.

## 2014-04-25 NOTE — Progress Notes (Signed)
ANTICOAGULATION CONSULT NOTE - Initial Consult  Pharmacy Consult for Heparin Indication: VTE  Allergies  Allergen Reactions  . No Known Allergies     Patient Measurements: Height: 5\' 8"  (172.7 cm) Weight: 163 lb (73.936 kg) IBW/kg (Calculated) : 68.4 Heparin Dosing Weight:  73.9 kg  Vital Signs:    Labs: No results found for this basename: HGB, HCT, PLT, APTT, LABPROT, INR, HEPARINUNFRC, CREATININE, CKTOTAL, CKMB, TROPONINI,  in the last 72 hours  Estimated Creatinine Clearance: 80.8 ml/min (by C-G formula based on Cr of 0.77).   Medical History: Past Medical History  Diagnosis Date  . Attention deficit disorder   . Diabetes mellitus without complication   . Hyperlipidemia   . Hypertension   . Blind left eye     Medications:  Prescriptions prior to admission  Medication Sig Dispense Refill  . cefTRIAXone 1 g in dextrose 5 % 50 mL Inject 1 g into the vein daily.  42 g  0  . cyclobenzaprine (FLEXERIL) 5 MG tablet Take 1 tablet (5 mg total) by mouth 3 (three) times daily as needed for muscle spasms.  30 tablet  0  . lisinopril (PRINIVIL,ZESTRIL) 20 MG tablet Take 20 mg by mouth daily.      Marland Kitchen menthol-cetylpyridinium (CEPACOL) 3 MG lozenge Take 1 lozenge (3 mg total) by mouth as needed for sore throat.  100 tablet  12  . metFORMIN (GLUCOPHAGE-XR) 500 MG 24 hr tablet Take 2,000 mg by mouth at bedtime.       Marland Kitchen oxyCODONE-acetaminophen (PERCOCET/ROXICET) 5-325 MG per tablet Take 1-2 tablets by mouth every 4 (four) hours as needed for severe pain.  20 tablet  0  . Rivaroxaban (XARELTO) 15 MG TABS tablet Take 1 tablet (15 mg total) by mouth 2 (two) times daily with a meal.  41 tablet  0  . rivaroxaban (XARELTO) 20 MG TABS tablet Take 1 tablet (20 mg total) by mouth daily with supper. Start taking 20 mg daily when the 21 days of 15 mg bid is complete  30 tablet  2  . simvastatin (ZOCOR) 20 MG tablet Take 20 mg by mouth daily.        Assessment: Failed outpt abx for knee  infection  73 y/o M on Ceftriaxone at facility for viridans strep knee infection. Patient underwent knee washout in OR 03/29/14. Despite ASA therapy, pt developed B DVT's as was on Xarelto (last dose 6/7). Patient is deemed to have failed outpatient abx and is admitted for surgical clean-out.  Goal of Therapy:  Heparin level 0.3-0.7 units/ml and aPTT=66-102 Monitor platelets by anticoagulation protocol: Yes   Plan:  1. Check baseline aPTT and heparin level to determine affects of residual Xarelto. 2. Start IV heparin (no bolus) at 1200 units/hr 3. In 6 hrs, will check aPTT level 4. Daily aPTT, Heparin level and CBC   Dollie Mayse S. Alford Highland, PharmD, [facility] Clinical Staff Pharmacist Pager [phone]  Wayland Salinas 04/25/2014,5:40 PM

## 2014-04-25 NOTE — Progress Notes (Signed)
Plan for surgery in the late morning on Tuesday Patient will need to have heparin stopped by pharmacy prior to the procedure

## 2014-04-25 NOTE — H&P (Addendum)
[facility] History and Physical  [name] WJX:[id number] DOB: 18-Sep-1941 DOA: 04/25/2014  Referring physician: Marlou Sa PCP: Tawanna Solo, MD   Chief Complaint: knee pain  HPI: [name] is a 73 y.o. male  admitted on 03/29/2014 with a chief complaint of bilateral knee pain, and found to have a diagnosis of knee infection. They were brought to the operating room on 03/29/2014 and underwent the above named procedures. The patient tolerated the knee washouts well, but was slow to mobilize in physical therapy. Infectious disease was consulted for antibiotic management. The surgical cx was viridans strep The patient was placed on asprin for DVT prophylaxis after surgery; however, he did develop bilateral DVTs. Medical service was consulted and they recommend Xeralto for treatment. He is discharged in good condition for SNF placement and intensive physical therapy. He will be on Rocephin IV for 6 weeks per id and Xeralto for DVT treatment.     Per ortho, patient has failed outpatient IV abx and is being admitted for surgical clean out. He has not taken his xarelto today. Patient is from [facility] No fevers, just pain in his knees and some chills.  No HA, no CP, no SOB.  Has PICC from previous admission   Review of Systems:  All systems reviewed, negative unless stated above     Past Medical History  Diagnosis Date  . Attention deficit disorder   . Diabetes mellitus without complication   . Hyperlipidemia   . Hypertension   . Blind left eye    Past Surgical History  Procedure Laterality Date  . Lipoma removal, right upper back    . Eye surgery Left     x3  . Tonsillectomy    . Knee arthroscopy Bilateral 03/29/2014    Procedure: RIGHT KNEE ARTHROSCOPY AND DEBRIDEMENT, LEFT KNEE ASPIRATION,  LEFT KNEE ARTHROSCOPY AND DEBRIDEMENT.;  Surgeon: Meredith Pel, MD;  Location: [place];  Service: Orthopedics;  Laterality: Bilateral;  RIGHT KNEE ARTHROSCOPY AND  DEBRIDEMENT, LEFT KNEE ASPIRATION,  LEFT KNEE ARTHROSCOPY AND DEBRIDEMENT.   Social History:  reports that he has quit smoking. He does not have any smokeless tobacco history on file. He reports that he does not drink alcohol or use illicit drugs.  Allergies  Allergen Reactions  . No Known Allergies     Family History  Problem Relation Age of Onset  . Heart attack Father   . Heart attack Sister   . Heart attack Brother      Prior to Admission medications   Medication Sig Start Date End Date Taking? Authorizing Provider  cefTRIAXone 1 g in dextrose 5 % 50 mL Inject 1 g into the vein daily. 04/05/14   Bobby Rumpf York, PA-C  cyclobenzaprine (FLEXERIL) 5 MG tablet Take 1 tablet (5 mg total) by mouth 3 (three) times daily as needed for muscle spasms. 04/04/14   Meredith Pel, MD  lisinopril (PRINIVIL,ZESTRIL) 20 MG tablet Take 20 mg by mouth daily.    Historical Provider, MD  menthol-cetylpyridinium (CEPACOL) 3 MG lozenge Take 1 lozenge (3 mg total) by mouth as needed for sore throat. 04/05/14   Bobby Rumpf York, PA-C  metFORMIN (GLUCOPHAGE-XR) 500 MG 24 hr tablet Take 2,000 mg by mouth at bedtime.     Historical Provider, MD  oxyCODONE-acetaminophen (PERCOCET/ROXICET) 5-325 MG per tablet Take 1-2 tablets by mouth every 4 (four) hours as needed for severe pain. 04/05/14   Melton Alar, PA-C  Rivaroxaban (XARELTO) 15 MG TABS tablet Take  1 tablet (15 mg total) by mouth 2 (two) times daily with a meal. 04/05/14   Melton Alar, PA-C  rivaroxaban (XARELTO) 20 MG TABS tablet Take 1 tablet (20 mg total) by mouth daily with supper. Start taking 20 mg daily when the 21 days of 15 mg bid is complete 04/05/14   Melton Alar, PA-C  simvastatin (ZOCOR) 20 MG tablet Take 20 mg by mouth daily.    Historical Provider, MD   Physical Exam: There were no vitals filed for this visit.  Ht 5\' 8"  (1.727 m)  Wt 73.936 kg (163 lb)  BMI 24.79 kg/m2  General:  Appears calm and comfortable Eyes: PERRL,  normal lids, irises & conjunctiva ENT: grossly normal hearing, lips & tongue Neck: no LAD, masses or thyromegaly Cardiovascular: RRR, no m/r/g. No LE edema. Respiratory: CTA bilaterally, no w/r/r. Normal respiratory effort. Abdomen: soft, ntnd Skin: no rash or induration seen on limited exam Musculoskeletal: knees with b/l scarring Psychiatric: grossly normal mood and affect, speech fluent and appropriate Neurologic: grossly non-focal.          Labs on Admission:  Basic Metabolic Panel: No results found for this basename: NA, K, CL, CO2, GLUCOSE, BUN, CREATININE, CALCIUM, MG, PHOS,  in the last 168 hours Liver Function Tests: No results found for this basename: AST, ALT, ALKPHOS, BILITOT, PROT, ALBUMIN,  in the last 168 hours No results found for this basename: LIPASE, AMYLASE,  in the last 168 hours No results found for this basename: AMMONIA,  in the last 168 hours CBC: No results found for this basename: WBC, NEUTROABS, HGB, HCT, MCV, PLT,  in the last 168 hours Cardiac Enzymes: No results found for this basename: CKTOTAL, CKMB, CKMBINDEX, TROPONINI,  in the last 168 hours  BNP (last 3 results) No results found for this basename: PROBNP,  in the last 8760 hours CBG: No results found for this basename: GLUCAP,  in the last 168 hours  Radiological Exams on Admission: No results found.  EKG: Independently reviewed. Await results  Assessment/Plan Active Problems:   Essential hypertension   Diabetes mellitus type II, controlled   Hyperlipidemia   Bacterial infection of knee joint   DVT of lower extremity, bilateral   Septic joint   Septic joint- await ortho and ID- will hold on abx until then  DM- SSI and monitor, hold metformin  DVT- was on xarelto- heparin gtt before surgery, resume xarelto when ok with surgery  HTN- resume home meds    Ortho ID  Code Status: full Family Communication:  Disposition Plan: admit  Time spent: 75 min  [place]  [facility] Pager [phone]  **Disclaimer: This note Brandon have been dictated with voice recognition software. Similar sounding words can inadvertently be transcribed and this note Brandon contain transcription errors which Brandon not have been corrected upon publication of note.**

## 2014-04-26 ENCOUNTER — Encounter (HOSPITAL_COMMUNITY)
Admission: RE | Disposition: A | Discharge status: Skilled Nursing Facility | Payer: Self-pay | Source: Ambulatory Visit | Attending: Family Medicine

## 2014-04-26 ENCOUNTER — Inpatient Hospital Stay (HOSPITAL_COMMUNITY): Payer: Medicare Other | Admitting: Certified Registered Nurse Anesthetist

## 2014-04-26 ENCOUNTER — Encounter (HOSPITAL_COMMUNITY): Payer: Self-pay | Admitting: Anesthesiology

## 2014-04-26 ENCOUNTER — Encounter (HOSPITAL_COMMUNITY): Payer: Medicare Other | Admitting: Certified Registered Nurse Anesthetist

## 2014-04-26 DIAGNOSIS — M659 Synovitis and tenosynovitis, unspecified: Secondary | ICD-10-CM | POA: Diagnosis not present

## 2014-04-26 DIAGNOSIS — E119 Type 2 diabetes mellitus without complications: Secondary | ICD-10-CM | POA: Diagnosis not present

## 2014-04-26 DIAGNOSIS — M25469 Effusion, unspecified knee: Secondary | ICD-10-CM | POA: Diagnosis not present

## 2014-04-26 DIAGNOSIS — M009 Pyogenic arthritis, unspecified: Secondary | ICD-10-CM

## 2014-04-26 DIAGNOSIS — G9340 Encephalopathy, unspecified: Secondary | ICD-10-CM | POA: Diagnosis not present

## 2014-04-26 DIAGNOSIS — E43 Unspecified severe protein-calorie malnutrition: Secondary | ICD-10-CM | POA: Diagnosis not present

## 2014-04-26 DIAGNOSIS — I82409 Acute embolism and thrombosis of unspecified deep veins of unspecified lower extremity: Secondary | ICD-10-CM | POA: Diagnosis not present

## 2014-04-26 DIAGNOSIS — M90869 Osteopathy in diseases classified elsewhere, unspecified lower leg: Secondary | ICD-10-CM | POA: Diagnosis not present

## 2014-04-26 DIAGNOSIS — L089 Local infection of the skin and subcutaneous tissue, unspecified: Secondary | ICD-10-CM | POA: Diagnosis not present

## 2014-04-26 HISTORY — PX: KNEE ARTHROTOMY: SHX5881

## 2014-04-26 LAB — BASIC METABOLIC PANEL WITH GFR
BUN: 8 mg/dL (ref 6–23)
CO2: 24 meq/L (ref 19–32)
Calcium: 9.5 mg/dL (ref 8.4–10.5)
Chloride: 94 meq/L — ABNORMAL LOW (ref 96–112)
Creatinine, Ser: 0.68 mg/dL (ref 0.50–1.35)
GFR calc Af Amer: >90 mL/min (ref >90)
GFR calc non Af Amer: >90 mL/min (ref >90)
Glucose, Bld: 116 mg/dL — ABNORMAL HIGH (ref 70–99)
Potassium: 4.2 meq/L (ref 3.7–5.3)
Sodium: 135 meq/L — ABNORMAL LOW (ref 137–147)

## 2014-04-26 LAB — GLUCOSE, CAPILLARY
GLUCOSE-CAPILLARY: 98 mg/dL (ref 70–99)
Glucose-Capillary: 113 mg/dL — ABNORMAL HIGH (ref 70–99)
Glucose-Capillary: 126 mg/dL — ABNORMAL HIGH (ref 70–99)
Glucose-Capillary: 153 mg/dL — ABNORMAL HIGH (ref 70–99)
Glucose-Capillary: 189 mg/dL — ABNORMAL HIGH (ref 70–99)

## 2014-04-26 LAB — CBC
HCT: 27.7 % — ABNORMAL LOW (ref 39.0–52.0)
Hemoglobin: 8.8 g/dL — ABNORMAL LOW (ref 13.0–17.0)
MCH: 27 pg (ref 26.0–34.0)
MCHC: 31.8 g/dL (ref 30.0–36.0)
MCV: 85 fL (ref 78.0–100.0)
Platelets: 588 K/uL — ABNORMAL HIGH (ref 150–400)
RBC: 3.26 MIL/uL — ABNORMAL LOW (ref 4.22–5.81)
RDW: 14.6 % (ref 11.5–15.5)
WBC: 8.9 K/uL (ref 4.0–10.5)

## 2014-04-26 LAB — APTT: APTT: 62 s — AB (ref 24–37)

## 2014-04-26 LAB — HEPARIN LEVEL (UNFRACTIONATED): Heparin Unfractionated: 0.31 IU/mL (ref 0.30–0.70)

## 2014-04-26 SURGERY — ARTHROTOMY, KNEE
Anesthesia: General | Site: Knee | Laterality: Bilateral

## 2014-04-26 MED ORDER — METOCLOPRAMIDE HCL 5 MG/ML IJ SOLN
INTRAMUSCULAR | Status: AC
  Filled 2014-04-26: qty 2

## 2014-04-26 MED ORDER — FENTANYL CITRATE 0.05 MG/ML IJ SOLN
INTRAMUSCULAR | Status: AC
  Filled 2014-04-26: qty 5

## 2014-04-26 MED ORDER — ONDANSETRON HCL 4 MG PO TABS: 4.0000 mg | ORAL_TABLET | Freq: Four times a day (QID) | ORAL | Status: DC | PRN

## 2014-04-26 MED ORDER — BUPIVACAINE-EPINEPHRINE (PF) 0.25% -1:200000 IJ SOLN
INTRAMUSCULAR | Status: AC
  Filled 2014-04-26: qty 30

## 2014-04-26 MED ORDER — ESMOLOL HCL 10 MG/ML IV SOLN
INTRAVENOUS | Status: AC
  Filled 2014-04-26: qty 10

## 2014-04-26 MED ORDER — ONDANSETRON HCL 4 MG/2ML IJ SOLN
INTRAMUSCULAR | Status: AC
  Filled 2014-04-26: qty 2

## 2014-04-26 MED ORDER — HEMOSTATIC AGENTS (NO CHARGE) OPTIME
TOPICAL | Status: DC | PRN
  Administered 2014-04-26: 1 via TOPICAL

## 2014-04-26 MED ORDER — MORPHINE SULFATE 4 MG/ML IJ SOLN
INTRAMUSCULAR | Status: AC
  Filled 2014-04-26: qty 2

## 2014-04-26 MED ORDER — PROPRANOLOL HCL 1 MG/ML IV SOLN
0.5000 mg | INTRAVENOUS | Status: DC | PRN
  Administered 2014-04-26: 0.5 mg via INTRAVENOUS

## 2014-04-26 MED ORDER — GLYCOPYRROLATE 0.2 MG/ML IJ SOLN
INTRAMUSCULAR | Status: AC
  Filled 2014-04-26: qty 5

## 2014-04-26 MED ORDER — BACID PO TABS
1.0000 | ORAL_TABLET | Freq: Two times a day (BID) | ORAL | Status: DC
  Administered 2014-04-26 – 2014-05-02 (×11): 1 via ORAL
  Filled 2014-04-26 (×13): qty 1

## 2014-04-26 MED ORDER — ESMOLOL HCL 10 MG/ML IV SOLN
INTRAVENOUS | Status: DC | PRN
  Administered 2014-04-26 (×3): 10 mg via INTRAVENOUS
  Administered 2014-04-26: 20 mg via INTRAVENOUS

## 2014-04-26 MED ORDER — SERTRALINE HCL 25 MG PO TABS
25.0000 mg | ORAL_TABLET | Freq: Every day | ORAL | Status: DC
  Administered 2014-05-01 – 2014-05-02 (×2): 25 mg via ORAL
  Filled 2014-04-26 (×2): qty 1

## 2014-04-26 MED ORDER — MIDAZOLAM HCL 2 MG/2ML IJ SOLN
INTRAMUSCULAR | Status: AC
  Filled 2014-04-26: qty 2

## 2014-04-26 MED ORDER — BUPIVACAINE-EPINEPHRINE (PF) 0.25% -1:200000 IJ SOLN
INTRAMUSCULAR | Status: AC
  Filled 2014-04-26: qty 60

## 2014-04-26 MED ORDER — FENTANYL CITRATE 0.05 MG/ML IJ SOLN
INTRAMUSCULAR | Status: DC | PRN
  Administered 2014-04-26: 100 ug via INTRAVENOUS
  Administered 2014-04-26 (×4): 50 ug via INTRAVENOUS
  Administered 2014-04-26: 25 ug via INTRAVENOUS
  Administered 2014-04-26 (×2): 50 ug via INTRAVENOUS
  Administered 2014-04-26: 100 ug via INTRAVENOUS
  Administered 2014-04-26: 25 ug via INTRAVENOUS
  Administered 2014-04-26 (×4): 50 ug via INTRAVENOUS
  Administered 2014-04-26 (×2): 25 ug via INTRAVENOUS
  Administered 2014-04-26 (×2): 50 ug via INTRAVENOUS

## 2014-04-26 MED ORDER — PROPOFOL 10 MG/ML IV BOLUS
INTRAVENOUS | Status: AC
  Filled 2014-04-26: qty 20

## 2014-04-26 MED ORDER — GENTAMICIN SULFATE 40 MG/ML IJ SOLN
INTRAMUSCULAR | Status: AC
  Filled 2014-04-26: qty 8

## 2014-04-26 MED ORDER — LOPERAMIDE HCL 2 MG PO TABS: 2.0000 mg | ORAL_TABLET | Freq: Four times a day (QID) | ORAL | Status: DC | PRN

## 2014-04-26 MED ORDER — GLYCOPYRROLATE 0.2 MG/ML IJ SOLN
INTRAMUSCULAR | Status: AC
  Filled 2014-04-26: qty 3

## 2014-04-26 MED ORDER — ONDANSETRON HCL 4 MG/2ML IJ SOLN
INTRAMUSCULAR | Status: DC | PRN
  Administered 2014-04-26: 4 mg via INTRAVENOUS

## 2014-04-26 MED ORDER — ONDANSETRON HCL 4 MG/2ML IJ SOLN: 4.0000 mg | Freq: Four times a day (QID) | INTRAMUSCULAR | Status: DC | PRN

## 2014-04-26 MED ORDER — RIVAROXABAN 10 MG PO TABS
10.0000 mg | ORAL_TABLET | Freq: Every day | ORAL | Status: DC
  Filled 2014-04-26 (×2): qty 1

## 2014-04-26 MED ORDER — GENTAMICIN SULFATE 40 MG/ML IJ SOLN
INTRAMUSCULAR | Status: AC
  Filled 2014-04-26: qty 4

## 2014-04-26 MED ORDER — VANCOMYCIN HCL 1000 MG IV SOLR
INTRAVENOUS | Status: DC | PRN
  Administered 2014-04-26: 1000 mg

## 2014-04-26 MED ORDER — VANCOMYCIN HCL 1000 MG IV SOLR
INTRAVENOUS | Status: AC
  Filled 2014-04-26: qty 2000

## 2014-04-26 MED ORDER — THROMBIN 20000 UNITS EX KIT
PACK | CUTANEOUS | Status: AC
  Filled 2014-04-26: qty 1

## 2014-04-26 MED ORDER — HYDROCODONE-ACETAMINOPHEN 10-325 MG PO TABS
1.0000 | ORAL_TABLET | Freq: Three times a day (TID) | ORAL | Status: DC
  Administered 2014-04-27 – 2014-04-28 (×6): 1 via ORAL
  Filled 2014-04-26 (×6): qty 1

## 2014-04-26 MED ORDER — HYDROMORPHONE HCL PF 1 MG/ML IJ SOLN
INTRAMUSCULAR | Status: AC
  Filled 2014-04-26: qty 1

## 2014-04-26 MED ORDER — DEXTROSE 5 % IV SOLN
1.0000 g | INTRAVENOUS | Status: DC | PRN
  Administered 2014-04-26: 1 g via INTRAVENOUS

## 2014-04-26 MED ORDER — LIDOCAINE HCL (CARDIAC) 20 MG/ML IV SOLN
INTRAVENOUS | Status: DC | PRN
  Administered 2014-04-26: 100 mg via INTRAVENOUS

## 2014-04-26 MED ORDER — NEOSTIGMINE METHYLSULFATE 10 MG/10ML IV SOLN
INTRAVENOUS | Status: DC | PRN
  Administered 2014-04-26: 5 mg via INTRAVENOUS

## 2014-04-26 MED ORDER — OXYCODONE HCL 5 MG PO TABS: 5.0000 mg | ORAL_TABLET | Freq: Once | ORAL | Status: DC | PRN

## 2014-04-26 MED ORDER — ACETAMINOPHEN 650 MG RE SUPP: 650.0000 mg | Freq: Four times a day (QID) | RECTAL | Status: DC | PRN

## 2014-04-26 MED ORDER — DEXTROSE 5 % IV SOLN
1.0000 g | Freq: Once | INTRAVENOUS | Status: DC
  Filled 2014-04-26: qty 10

## 2014-04-26 MED ORDER — PROTAMINE SULFATE 10 MG/ML IV SOLN
10.0000 mg | Freq: Once | INTRAVENOUS | Status: AC
  Administered 2014-04-26: 10 mg via INTRAVENOUS
  Filled 2014-04-26 (×2): qty 1

## 2014-04-26 MED ORDER — GLYCOPYRROLATE 0.2 MG/ML IJ SOLN
INTRAMUSCULAR | Status: DC | PRN
  Administered 2014-04-26: .8 mg via INTRAVENOUS

## 2014-04-26 MED ORDER — OXYCODONE HCL 5 MG/5ML PO SOLN: 5.0000 mg | Freq: Once | ORAL | Status: DC | PRN

## 2014-04-26 MED ORDER — METOCLOPRAMIDE HCL 10 MG PO TABS: 5.0000 mg | ORAL_TABLET | Freq: Three times a day (TID) | ORAL | Status: DC | PRN

## 2014-04-26 MED ORDER — NEOSTIGMINE METHYLSULFATE 10 MG/10ML IV SOLN
INTRAVENOUS | Status: AC
  Filled 2014-04-26: qty 1

## 2014-04-26 MED ORDER — CLONIDINE HCL (ANALGESIA) 100 MCG/ML EP SOLN
150.0000 ug | Freq: Once | EPIDURAL | Status: DC
  Filled 2014-04-26: qty 1.5

## 2014-04-26 MED ORDER — VITAMIN D (ERGOCALCIFEROL) 1.25 MG (50000 UNIT) PO CAPS
50000.0000 [IU] | ORAL_CAPSULE | ORAL | Status: DC
  Administered 2014-04-29 – 2014-05-02 (×2): 50000 [IU] via ORAL
  Filled 2014-04-26 (×2): qty 1

## 2014-04-26 MED ORDER — PROPOFOL 10 MG/ML IV BOLUS
INTRAVENOUS | Status: DC | PRN
  Administered 2014-04-26: 200 mg via INTRAVENOUS

## 2014-04-26 MED ORDER — GENTAMICIN SULFATE 40 MG/ML IJ SOLN
INTRAMUSCULAR | Status: AC
  Filled 2014-04-26: qty 6

## 2014-04-26 MED ORDER — HYDROCODONE-ACETAMINOPHEN 10-325 MG PO TABS
2.0000 | ORAL_TABLET | Freq: Every day | ORAL | Status: DC
  Administered 2014-04-26 – 2014-04-27 (×2): 2 via ORAL
  Filled 2014-04-26 (×2): qty 2

## 2014-04-26 MED ORDER — BACLOFEN 5 MG HALF TABLET
5.0000 mg | ORAL_TABLET | Freq: Three times a day (TID) | ORAL | Status: DC | PRN
  Filled 2014-04-26: qty 1

## 2014-04-26 MED ORDER — MORPHINE SULFATE 4 MG/ML IJ SOLN
INTRAMUSCULAR | Status: AC
  Filled 2014-04-26: qty 1

## 2014-04-26 MED ORDER — GLUCERNA SHAKE PO LIQD
237.0000 mL | Freq: Three times a day (TID) | ORAL | Status: DC
  Administered 2014-04-26 – 2014-04-27 (×2): 237 mL via ORAL

## 2014-04-26 MED ORDER — SODIUM CHLORIDE 0.9 % IR SOLN
Status: DC | PRN
  Administered 2014-04-26: 1

## 2014-04-26 MED ORDER — VANCOMYCIN HCL 1000 MG IV SOLR
INTRAVENOUS | Status: AC
  Filled 2014-04-26: qty 1000

## 2014-04-26 MED ORDER — GLUCERNA PO LIQD: 1.0000 | Freq: Three times a day (TID) | ORAL | Status: DC

## 2014-04-26 MED ORDER — ARTIFICIAL TEARS OP OINT
TOPICAL_OINTMENT | OPHTHALMIC | Status: AC
  Filled 2014-04-26: qty 3.5

## 2014-04-26 MED ORDER — ACETAMINOPHEN 325 MG PO TABS
650.0000 mg | ORAL_TABLET | Freq: Four times a day (QID) | ORAL | Status: DC | PRN
  Administered 2014-04-29 (×2): 650 mg via ORAL
  Filled 2014-04-26 (×3): qty 2

## 2014-04-26 MED ORDER — LACTATED RINGERS IV SOLN
INTRAVENOUS | Status: DC
  Administered 2014-04-26: 11:00:00 via INTRAVENOUS

## 2014-04-26 MED ORDER — PHENOL 1.4 % MT LIQD
1.0000 | OROMUCOSAL | Status: DC | PRN
Start: 2014-04-26 — End: 2014-05-02

## 2014-04-26 MED ORDER — PROPRANOLOL HCL 1 MG/ML IV SOLN
INTRAVENOUS | Status: AC
  Filled 2014-04-26: qty 1

## 2014-04-26 MED ORDER — HYDROMORPHONE HCL PF 1 MG/ML IJ SOLN
1.0000 mg | INTRAMUSCULAR | Status: DC | PRN
  Administered 2014-04-26 – 2014-04-28 (×6): 1 mg via INTRAVENOUS
  Filled 2014-04-26 (×6): qty 1

## 2014-04-26 MED ORDER — ROCURONIUM BROMIDE 100 MG/10ML IV SOLN
INTRAVENOUS | Status: DC | PRN
  Administered 2014-04-26: 40 mg via INTRAVENOUS

## 2014-04-26 MED ORDER — HYDROMORPHONE HCL PF 1 MG/ML IJ SOLN
0.2500 mg | INTRAMUSCULAR | Status: DC | PRN
  Administered 2014-04-26: 0.25 mg via INTRAVENOUS
  Administered 2014-04-26: 0.5 mg via INTRAVENOUS
  Administered 2014-04-26: 0.25 mg via INTRAVENOUS
  Administered 2014-04-26 (×2): 0.5 mg via INTRAVENOUS

## 2014-04-26 MED ORDER — LACTATED RINGERS IV SOLN
INTRAVENOUS | Status: DC | PRN
Start: 2014-04-26 — End: 2014-04-26
  Administered 2014-04-26 (×2): via INTRAVENOUS

## 2014-04-26 MED ORDER — GENTAMICIN SULFATE 40 MG/ML IJ SOLN
INTRAMUSCULAR | Status: AC
Start: 2014-04-26 — End: 2014-04-26
  Filled 2014-04-26: qty 2

## 2014-04-26 MED ORDER — LOPERAMIDE HCL 2 MG PO CAPS
2.0000 mg | ORAL_CAPSULE | Freq: Four times a day (QID) | ORAL | Status: DC | PRN
  Filled 2014-04-26: qty 1

## 2014-04-26 MED ORDER — METOCLOPRAMIDE HCL 5 MG/ML IJ SOLN: 5.0000 mg | Freq: Three times a day (TID) | INTRAMUSCULAR | Status: DC | PRN

## 2014-04-26 MED ORDER — LIDOCAINE HCL (CARDIAC) 20 MG/ML IV SOLN
INTRAVENOUS | Status: AC
  Filled 2014-04-26: qty 5

## 2014-04-26 MED ORDER — METOCLOPRAMIDE HCL 5 MG/ML IJ SOLN: 10.0000 mg | Freq: Once | INTRAMUSCULAR | Status: DC | PRN

## 2014-04-26 MED ORDER — MENTHOL 3 MG MT LOZG: 1.0000 | LOZENGE | OROMUCOSAL | Status: DC | PRN

## 2014-04-26 MED ORDER — ERGOCALCIFEROL 1.25 MG (50000 UT) PO CAPS: 50000.0000 [IU] | ORAL_CAPSULE | ORAL | Status: DC

## 2014-04-26 MED ORDER — ROCURONIUM BROMIDE 50 MG/5ML IV SOLN
INTRAVENOUS | Status: AC
  Filled 2014-04-26: qty 1

## 2014-04-26 MED ORDER — SERTRALINE HCL 25 MG PO TABS
12.5000 mg | ORAL_TABLET | Freq: Every day | ORAL | Status: AC
  Administered 2014-04-26 – 2014-04-30 (×5): 12.5 mg via ORAL
  Filled 2014-04-26 (×6): qty 0.5

## 2014-04-26 MED ORDER — METOCLOPRAMIDE HCL 5 MG/ML IJ SOLN
INTRAMUSCULAR | Status: DC | PRN
  Administered 2014-04-26: 10 mg via INTRAVENOUS

## 2014-04-26 SURGICAL SUPPLY — 68 items
BANDAGE ELASTIC 4 VELCRO ST LF (GAUZE/BANDAGES/DRESSINGS) ×4 IMPLANT
BANDAGE GAUZE ELAST BULKY 4 IN (GAUZE/BANDAGES/DRESSINGS) IMPLANT
BNDG COHESIVE 4X5 TAN STRL (GAUZE/BANDAGES/DRESSINGS) IMPLANT
BUCKET CAST 5QT PAPER WAX WHT (CAST SUPPLIES) ×2 IMPLANT
COVER SURGICAL LIGHT HANDLE (MISCELLANEOUS) ×3 IMPLANT
CUFF TOURNIQUET SINGLE 18IN (TOURNIQUET CUFF) ×3 IMPLANT
CUFF TOURNIQUET SINGLE 24IN (TOURNIQUET CUFF) IMPLANT
CUFF TOURNIQUET SINGLE 34IN LL (TOURNIQUET CUFF) IMPLANT
CUFF TOURNIQUET SINGLE 44IN (TOURNIQUET CUFF) ×4 IMPLANT
DRAPE EXTREMITY BILATERAL (DRAPE) ×3 IMPLANT
DRAPE U-SHAPE 47X51 STRL (DRAPES) ×6 IMPLANT
DRSG MEPILEX BORDER 4X12 (GAUZE/BANDAGES/DRESSINGS) ×4 IMPLANT
DRSG PAD ABDOMINAL 8X10 ST (GAUZE/BANDAGES/DRESSINGS) ×4 IMPLANT
DURAPREP 26ML APPLICATOR (WOUND CARE) ×6 IMPLANT
ELECT REM PT RETURN 9FT ADLT (ELECTROSURGICAL)
ELECTRODE REM PT RTRN 9FT ADLT (ELECTROSURGICAL) IMPLANT
FACESHIELD WRAPAROUND (MASK) ×3 IMPLANT
FACESHIELD WRAPAROUND OR TEAM (MASK) ×1 IMPLANT
GAUZE XEROFORM 5X9 LF (GAUZE/BANDAGES/DRESSINGS) ×4 IMPLANT
GLOVE BIOGEL PI IND STRL 8 (GLOVE) ×1 IMPLANT
GLOVE BIOGEL PI INDICATOR 8 (GLOVE) ×6
GLOVE SURG ORTHO 8.0 STRL STRW (GLOVE) ×8 IMPLANT
GOWN STRL REUS W/ TWL LRG LVL3 (GOWN DISPOSABLE) ×2 IMPLANT
GOWN STRL REUS W/ TWL XL LVL3 (GOWN DISPOSABLE) ×1 IMPLANT
GOWN STRL REUS W/TWL LRG LVL3 (GOWN DISPOSABLE) ×15
GOWN STRL REUS W/TWL XL LVL3 (GOWN DISPOSABLE) ×3
HANDPIECE INTERPULSE COAX TIP (DISPOSABLE)
IMMOBILIZER KNEE 22 (SOFTGOODS) ×2 IMPLANT
KIT BASIN OR (CUSTOM PROCEDURE TRAY) ×3 IMPLANT
KIT ROOM TURNOVER OR (KITS) ×3 IMPLANT
KIT STIMULAN RAPID CURE  10CC (Orthopedic Implant) ×4 IMPLANT
KIT STIMULAN RAPID CURE 10CC (Orthopedic Implant) IMPLANT
MANIFOLD NEPTUNE II (INSTRUMENTS) ×3 IMPLANT
NS IRRIG 1000ML POUR BTL (IV SOLUTION) ×25 IMPLANT
PACK ORTHO EXTREMITY (CUSTOM PROCEDURE TRAY) ×3 IMPLANT
PAD ARMBOARD 7.5X6 YLW CONV (MISCELLANEOUS) ×6 IMPLANT
PAD CAST 4YDX4 CTTN HI CHSV (CAST SUPPLIES) IMPLANT
PADDING CAST COTTON 4X4 STRL (CAST SUPPLIES) ×15
PADDING CAST SYNTHETIC 4 (CAST SUPPLIES) ×2
PADDING CAST SYNTHETIC 4X4 STR (CAST SUPPLIES) IMPLANT
SET HNDPC FAN SPRY TIP SCT (DISPOSABLE) IMPLANT
SPLINT PLASTER CAST XFAST 5X30 (CAST SUPPLIES) IMPLANT
SPLINT PLASTER XFAST SET 5X30 (CAST SUPPLIES) ×2
SPONGE GAUZE 4X4 12PLY (GAUZE/BANDAGES/DRESSINGS) ×4 IMPLANT
SPONGE LAP 18X18 X RAY DECT (DISPOSABLE) ×13 IMPLANT
SPONGE LAP 4X18 X RAY DECT (DISPOSABLE) ×3 IMPLANT
STOCKINETTE IMPERVIOUS 9X36 MD (GAUZE/BANDAGES/DRESSINGS) ×6 IMPLANT
SUT ETHILON 2 0 FS 18 (SUTURE) IMPLANT
SUT ETHILON 3 0 PS 1 (SUTURE) IMPLANT
SUT ETHILON 4 0 PS 2 18 (SUTURE) IMPLANT
SUT PROLENE 3 0 PS 2 (SUTURE) IMPLANT
SUT VIC AB 0 CT1 18XCR BRD 8 (SUTURE) IMPLANT
SUT VIC AB 0 CT1 8-18 (SUTURE) ×21
SUT VIC AB 1 CT1 18XCR BRD 8 (SUTURE) IMPLANT
SUT VIC AB 1 CT1 8-18 (SUTURE) ×18
SUT VIC AB 2-0 CT1 18 (SUTURE) ×16 IMPLANT
SUT VIC AB 2-0 CT1 27 (SUTURE) ×18
SUT VIC AB 2-0 CT1 27XBRD (SUTURE) IMPLANT
SUT VIC AB 3-0 SH 27 (SUTURE)
SUT VIC AB 3-0 SH 27X BRD (SUTURE) IMPLANT
TOWEL OR 17X24 6PK STRL BLUE (TOWEL DISPOSABLE) ×5 IMPLANT
TOWEL OR 17X26 10 PK STRL BLUE (TOWEL DISPOSABLE) ×5 IMPLANT
TUBE ANAEROBIC SPECIMEN COL (MISCELLANEOUS) ×4 IMPLANT
TUBE CONNECTING 12'X1/4 (SUCTIONS) ×1
TUBE CONNECTING 12X1/4 (SUCTIONS) ×2 IMPLANT
UNDERPAD 30X30 INCONTINENT (UNDERPADS AND DIAPERS) ×6 IMPLANT
WATER STERILE IRR 1000ML POUR (IV SOLUTION) ×3 IMPLANT
YANKAUER SUCT BULB TIP NO VENT (SUCTIONS) ×3 IMPLANT

## 2014-04-26 NOTE — Progress Notes (Signed)
CSW will assess patient when he returns from surgery. Patient is from [place].  Rhea Pink, MSW, [place]

## 2014-04-26 NOTE — Progress Notes (Signed)
ANTICOAGULATION CONSULT NOTE - Initial Consult  Pharmacy Consult for Heparin Indication: VTE  No Known Allergies  Patient Measurements: Height: 5\' 8"  (172.7 cm) Weight: 163 lb (73.936 kg) IBW/kg (Calculated) : 68.4 Heparin Dosing Weight:  73.9 kg  Vital Signs: Temp: 98.8 F (37.1 C) (06/09 0534) Temp src: Oral (06/09 0534) BP: 129/61 mmHg (06/09 0534) Pulse Rate: 98 (06/09 0534)  Labs:  Recent Labs  04/25/14 1747 04/25/14 1800 04/26/14 0505  HGB  --  8.1*  --   HCT  --  25.4*  --   PLT  --  514*  --   APTT 50*  --  62*  HEPARINUNFRC 0.65  --  0.31  CREATININE  --  0.66  --     Estimated Creatinine Clearance: 80.8 ml/min (by C-G formula based on Cr of 0.66).   Medical History: Past Medical History  Diagnosis Date  . Attention deficit disorder   . Diabetes mellitus without complication   . Hyperlipidemia   . Hypertension   . Blind left eye     Medications:  Prescriptions prior to admission  Medication Sig Dispense Refill  . baclofen (LIORESAL) 10 MG tablet Take 5 mg by mouth 3 (three) times daily as needed for muscle spasms.      . cefTRIAXone 1 g in dextrose 5 % 50 mL Inject 1 g into the vein daily.  42 g  0  . ergocalciferol (VITAMIN D2) 50000 UNITS capsule Take 50,000 Units by mouth See admin instructions. Take 1 capsule twice weekly for 4 weeks (Mondays and Fridays)      . GLUCERNA (GLUCERNA) LIQD Take 1 Can by mouth 3 (three) times daily between meals.      Marland Kitchen HYDROcodone-acetaminophen (NORCO) 10-325 MG per tablet Take 1-2 tablets by mouth 4 (four) times daily -  with meals and at bedtime. Give 1 tablet three times daily and 2 tablets at bedtime. HOLD FOR SOMNOLENCE      . lactobacillus acidophilus (BACID) TABS tablet Take 1 tablet by mouth 2 (two) times daily. INDEFINITELY WHILE ON ANTIBIOTICS      . lisinopril (PRINIVIL,ZESTRIL) 20 MG tablet Take 20 mg by mouth daily.      Marland Kitchen loperamide (IMODIUM A-D) 2 MG tablet Take 2 mg by mouth 4 (four) times daily as  needed for diarrhea or loose stools.      . menthol-cetylpyridinium (CEPACOL) 3 MG lozenge Take 1 lozenge (3 mg total) by mouth as needed for sore throat.  100 tablet  12  . metFORMIN (GLUCOPHAGE-XR) 500 MG 24 hr tablet Take 2,000 mg by mouth at bedtime.       . sertraline (ZOLOFT) 25 MG tablet Take 12.5-25 mg by mouth daily.      . simvastatin (ZOCOR) 20 MG tablet Take 20 mg by mouth daily.      . Rivaroxaban (XARELTO) 15 MG TABS tablet Take 1 tablet (15 mg total) by mouth 2 (two) times daily with a meal.  41 tablet  0  . rivaroxaban (XARELTO) 20 MG TABS tablet Take 1 tablet (20 mg total) by mouth daily with supper. Start taking 20 mg daily when the 21 days of 15 mg bid is complete  30 tablet  2    Assessment: Failed outpt abx for knee infection  APTT  60 with HL at 0.31 IU . No bleeding noted. Heparin and aptt appear consistent with no interference from xarelto last on the 7th . Will continue at current rate and f/u daily Goal of  Therapy:  Heparin level 0.3-0.7 units/ml and aPTT=66-102 Monitor platelets by anticoagulation protocol: Yes   Plan:   Continue heparin at 1200 units/hr and recheck  HL at noon for confirmation of consistent levels.   Curlene Dolphin

## 2014-04-26 NOTE — Anesthesia Preprocedure Evaluation (Addendum)
Anesthesia Evaluation  Patient identified by MRN, date of birth, ID band Patient awake    Reviewed: Allergy & Precautions, H&P , NPO status , Patient's Chart, lab work & pertinent test results, reviewed documented beta blocker date and time   Airway Mallampati: II TM Distance: >3 FB Neck ROM: full    Dental  (+) Edentulous Upper, Edentulous Lower, Dental Advisory Given   Pulmonary neg pulmonary ROS, former smoker,  breath sounds clear to auscultation        Cardiovascular hypertension, On Medications + Peripheral Vascular Disease Rhythm:regular     Neuro/Psych PSYCHIATRIC DISORDERS negative neurological ROS     GI/Hepatic negative GI ROS, Neg liver ROS,   Endo/Other  diabetes, Insulin Dependent, Oral Hypoglycemic Agents  Renal/GU negative Renal ROS  negative genitourinary   Musculoskeletal   Abdominal   Peds  (+) ATTENTION DEFICIT DISORDER WITHOUT HYPERACTIVITY Hematology negative hematology ROS (+)   Anesthesia Other Findings See surgeon's H&P   Reproductive/Obstetrics negative OB ROS                          Anesthesia Physical Anesthesia Plan  ASA: III  Anesthesia Plan: General   Post-op Pain Management:    Induction: Intravenous  Airway Management Planned: Oral ETT  Additional Equipment:   Intra-op Plan:   Post-operative Plan: Extubation in OR  Informed Consent: I have reviewed the patients History and Physical, chart, labs and discussed the procedure including the risks, benefits and alternatives for the proposed anesthesia with the patient or authorized representative who has indicated his/her understanding and acceptance.   Dental Advisory Given  Plan Discussed with: CRNA and Surgeon  Anesthesia Plan Comments:        Anesthesia Quick Evaluation

## 2014-04-26 NOTE — Progress Notes (Signed)
Utilization review completed.  

## 2014-04-26 NOTE — Brief Op Note (Signed)
04/25/2014 - 04/26/2014  3:09 PM  PATIENT:  [name]  73 y.o. male  PRE-OPERATIVE DIAGNOSIS:  BILATERAL KNEE INFECTION  POST-OPERATIVE DIAGNOSIS:  Bilateral knee infection  PROCEDURE:  Procedure(s): KNEE ARTHROTOMY, SYNOVECTOMY, ANTIBIOTIC BEAD PLACEMENT.bilateral with right knee manipulation  SURGEON:  Surgeon(s): Meredith Pel, MD  ASSISTANT: carla bethune rnfa  ANESTHESIA:   general  EBL: 250 ml    Total I/O In: 1700 [I.V.:1700] Out: 400 [Blood:400]  BLOOD ADMINISTERED: none  DRAINS: none   LOCAL MEDICATIONS USED:  Marcaine morphine clonidine both knees  SPECIMEN:  Cultures to micro bil and synovium to path bil  COUNTS:  YES  TOURNIQUET:  * No tourniquets in log *  DICTATION: .Other Dictation: Dictation Number [id number]  PLAN OF CARE: Admit to inpatient   PATIENT DISPOSITION:  PACU - hemodynamically stable

## 2014-04-26 NOTE — Anesthesia Procedure Notes (Signed)
Procedure Name: Intubation Date/Time: 04/26/2014 11:32 AM Performed by: Ollen Bowl Pre-anesthesia Checklist: Patient identified, Timeout performed, Emergency Drugs available, Suction available and Patient being monitored Patient Re-evaluated:Patient Re-evaluated prior to inductionOxygen Delivery Method: Circle system utilized and Simple face mask Preoxygenation: Pre-oxygenation with 100% oxygen Intubation Type: IV induction Ventilation: Mask ventilation without difficulty Laryngoscope Size: Miller and 3 Grade View: Grade II Tube type: Oral Tube size: 7.5 mm Number of attempts: 1 Airway Equipment and Method: Patient positioned with wedge pillow and Stylet Placement Confirmation: ETT inserted through vocal cords under direct vision,  positive ETCO2 and breath sounds checked- equal and bilateral Secured at: 23 cm Tube secured with: Tape Dental Injury: Teeth and Oropharynx as per pre-operative assessment

## 2014-04-26 NOTE — Progress Notes (Addendum)
[facility] PROGRESS NOTE  [name] EZM:[id number] DOB: 1940/12/17 DOA: 04/25/2014 PCP: Tawanna Solo, MD   brief narrative This 73 year old male with history of diabetes mellitus, hypertension, hyperlipidemia who was admitted 1 month back with septic knee and underwent bilateral knee arthroscopy with debridement with PICC line placed for IV Rocephin for strep viridens on wound cx. Patient was seen in orthopedic clinic with persistent swelling in the knees with aspirations done showing persistent elevated WBC greater than 50,000 in both the knees. Patient did not have any fever or chills but continued to have persistent pain in his knees with difficult traction or ambulation. Patient admitted to hospitalist service for further arthrotomy.  Assessment/Plan: Bilateral persistent septic knee joint On empiric IV Rocephin. Follow culture from the joint. Pain control. PT eval. will likely go back to skilled nursing facility. -ID consult based on synovial fluid analysis and cx. Blood cx so far negative .  -Continue IV hydration   Bilateral LE DVT Developed post op during last hospitalization. He is on xarelto his head for surgery and placed on heparin drip. Resume xarelto once cleared by ortho.  Diabetes mellitus Continue sliding scale insulin. Holding metformin  Hypertension Continue lisinopril  Hyperlipidemia Continue statin  Anemia Check iron panel   Code Status: Full code Family Communication: Wife and daughter at bedside  Disposition Plan: Likely discharge back to rehabilitation in next 24-48 hours   Consultants:  Orthopedics (Dr. Marlou Sa)  Procedures:  Knee arthrotomy, synovectomy and antibiotic bead placement bilaterally with a right knee manipulation  Antibiotics:  IV rocephin (6/8--)  HPI/Subjective: Patient seen and examined this morning. Complains of right knee pain  Objective: Filed Vitals:   04/26/14 1456  BP: 137/67  Pulse: 136  Temp: 98.1 F  (36.7 C)  Resp: 21    Intake/Output Summary (Last 24 hours) at 04/26/14 1525 Last data filed at 04/26/14 1418  Gross per 24 hour  Intake   1700 ml  Output    700 ml  Net   1000 ml   Filed Weights   04/25/14 1352  Weight: 73.936 kg (163 lb)    Exam:   General:  Elderly male in no acute distress  HEENT: No pallor, moist mucosa  Cardiovascular: Normal S1 and S2, no murmurs abdomen  Respiratory: clear b/l, no added sounds  Abdomen: Soft, nontender, nondistended, bowel sounds present  Musculoskeletal: Warm, tender right knee with limited ROM. Some tenderness of left knee. Has PICC from previous hospitalization.  CNS: AAOX3  Data Reviewed: Basic Metabolic Panel:  Recent Labs Lab 04/25/14 1800 04/26/14 0505  NA 134* 135*  K 4.3 4.2  CL 96 94*  CO2 27 24  GLUCOSE 120* 116*  BUN 11 8  CREATININE 0.66 0.68  CALCIUM 9.2 9.5   Liver Function Tests:  Recent Labs Lab 04/25/14 1800  AST 47*  ALT 35  ALKPHOS 97  BILITOT 0.2*  PROT 7.3  ALBUMIN 1.9*   No results found for this basename: LIPASE, AMYLASE,  in the last 168 hours No results found for this basename: AMMONIA,  in the last 168 hours CBC:  Recent Labs Lab 04/25/14 1800 04/26/14 0505  WBC 8.7 8.9  HGB 8.1* 8.8*  HCT 25.4* 27.7*  MCV 84.7 85.0  PLT 514* 588*   Cardiac Enzymes: No results found for this basename: CKTOTAL, CKMB, CKMBINDEX, TROPONINI,  in the last 168 hours BNP (last 3 results) No results found for this basename: PROBNP,  in the last 8760 hours CBG:  Recent  Labs Lab 04/25/14 1621 04/25/14 2122 04/26/14 0641 04/26/14 1018 04/26/14 1457  GLUCAP 104* 142* 113* 98 126*    Recent Results (from the past 240 hour(s))  CULTURE, BLOOD (ROUTINE X 2)     Status: None   Collection Time    04/25/14  6:35 PM      Result Value Ref Range Status   Specimen Description BLOOD LEFT ARM   Final   Special Requests     Final   Value: BOTTLES DRAWN AEROBIC AND ANAEROBIC BLUE 2.5CC RED 1CC    Culture  Setup Time     Final   Value: 04/25/2014 22:46     Performed at [facility]   Culture     Final   Value:        BLOOD CULTURE RECEIVED NO GROWTH TO DATE CULTURE WILL BE HELD FOR 5 DAYS BEFORE ISSUING A FINAL NEGATIVE REPORT     Performed at [facility]   Report Status PENDING   Incomplete     Studies: No results found.  Scheduled Meds: . [facility] HOLD] baclofen  10 mg Oral TID  . cefTRIAXone (ROCEPHIN)  IV  1 g Intravenous Once  . cloNIDine  150 mcg Intra-articular Once  . HYDROmorphone      . [[facility] HOLD] insulin aspart  0-15 Units Subcutaneous TID WC  . [[facility] HOLD] insulin aspart  0-5 Units Subcutaneous QHS  . [[facility] HOLD] lisinopril  20 mg Oral Daily  . [facility] HOLD] simvastatin  20 mg Oral q1800  . [[facility] HOLD] sodium chloride  3 mL Intravenous Q12H  . [facility] HOLD] sodium chloride  3 mL Intravenous Q12H   Continuous Infusions: . heparin 1,200 Units/hr (04/25/14 2005)  . lactated ringers 50 mL/hr at 04/26/14 1052      Time spent: 25 minutes    [name]  [facility] Pager [phone] If 7PM-7AM, please contact night-coverage at [email or web address], password [facility] 04/26/2014, 3:25 PM  LOS: 1 day

## 2014-04-26 NOTE — Anesthesia Postprocedure Evaluation (Signed)
  Anesthesia Post-op Note  Patient: [name]  Procedure(s) Performed: Procedure(s): KNEE ARTHROTOMY, SYNOVECTOMY, ANTIBIOTIC BEAD PLACEMENT. (Bilateral)  Patient Location: PACU  Anesthesia Type:General  Level of Consciousness: awake and alert   Airway and Oxygen Therapy: Patient Spontanous Breathing  Post-op Pain: moderate  Post-op Assessment: Post-op Vital signs reviewed  Post-op Vital Signs: stable  Last Vitals:  Filed Vitals:   04/26/14 1615  BP: 133/70  Pulse: 106  Temp: 37.5 C  Resp: 15    Complications: No apparent anesthesia complications

## 2014-04-26 NOTE — Transfer of Care (Signed)
Immediate Anesthesia Transfer of Care Note  Patient: [name]  Procedure(s) Performed: Procedure(s): KNEE ARTHROTOMY, SYNOVECTOMY, ANTIBIOTIC BEAD PLACEMENT. (Bilateral)  Patient Location: PACU  Anesthesia Type:General  Level of Consciousness: awake  Airway & Oxygen Therapy: Patient Spontanous Breathing and Patient connected to nasal cannula oxygen  Post-op Assessment: Report given to PACU RN, Post -op Vital signs reviewed and stable and Patient moving all extremities X 4  Post vital signs: Reviewed and stable  Complications: No apparent anesthesia complications

## 2014-04-27 DIAGNOSIS — M009 Pyogenic arthritis, unspecified: Secondary | ICD-10-CM | POA: Diagnosis not present

## 2014-04-27 DIAGNOSIS — I82409 Acute embolism and thrombosis of unspecified deep veins of unspecified lower extremity: Secondary | ICD-10-CM | POA: Diagnosis not present

## 2014-04-27 DIAGNOSIS — E119 Type 2 diabetes mellitus without complications: Secondary | ICD-10-CM | POA: Diagnosis not present

## 2014-04-27 DIAGNOSIS — I369 Nonrheumatic tricuspid valve disorder, unspecified: Secondary | ICD-10-CM | POA: Diagnosis not present

## 2014-04-27 LAB — GLUCOSE, CAPILLARY
GLUCOSE-CAPILLARY: 194 mg/dL — AB (ref 70–99)
GLUCOSE-CAPILLARY: 223 mg/dL — AB (ref 70–99)
GLUCOSE-CAPILLARY: 173 mg/dL — AB (ref 70–99)
Glucose-Capillary: 141 mg/dL — ABNORMAL HIGH (ref 70–99)

## 2014-04-27 LAB — IRON AND TIBC
Iron: <10 ug/dL — ABNORMAL LOW (ref 42–135)
UIBC: 134 ug/dL (ref 125–400)

## 2014-04-27 LAB — CBC
HEMATOCRIT: 23.1 % — AB (ref 39.0–52.0)
Hemoglobin: 7.4 g/dL — ABNORMAL LOW (ref 13.0–17.0)
MCH: 27.3 pg (ref 26.0–34.0)
MCHC: 32 g/dL (ref 30.0–36.0)
MCV: 85.2 fL (ref 78.0–100.0)
PLATELETS: 541 10*3/uL — AB (ref 150–400)
RBC: 2.71 MIL/uL — AB (ref 4.22–5.81)
RDW: 14.6 % (ref 11.5–15.5)
WBC: 12.5 10*3/uL — ABNORMAL HIGH (ref 4.0–10.5)

## 2014-04-27 MED ORDER — BOOST PLUS PO LIQD
237.0000 mL | Freq: Three times a day (TID) | ORAL | Status: DC
  Administered 2014-04-28 – 2014-05-02 (×10): 237 mL via ORAL
  Filled 2014-04-27 (×20): qty 237

## 2014-04-27 MED ORDER — DEXTROSE 5 % IV SOLN
2.0000 g | INTRAVENOUS | Status: DC
  Administered 2014-04-27 – 2014-04-29 (×3): 2 g via INTRAVENOUS
  Filled 2014-04-27 (×4): qty 2

## 2014-04-27 MED ORDER — VANCOMYCIN HCL IN DEXTROSE 1-5 GM/200ML-% IV SOLN
1000.0000 mg | Freq: Two times a day (BID) | INTRAVENOUS | Status: DC
  Administered 2014-04-27 – 2014-04-29 (×5): 1000 mg via INTRAVENOUS
  Filled 2014-04-27 (×6): qty 200

## 2014-04-27 MED ORDER — RIVAROXABAN 20 MG PO TABS
20.0000 mg | ORAL_TABLET | Freq: Every day | ORAL | Status: DC
  Administered 2014-04-27 – 2014-05-02 (×5): 20 mg via ORAL
  Filled 2014-04-27 (×7): qty 1

## 2014-04-27 NOTE — Evaluation (Signed)
Occupational Therapy Evaluation Patient Details Name: [name] MRN: [id number] DOB: 1941/10/12 Today's Date: 04/27/2014    History of Present Illness 73 year old man with history of septic arthritis bilateral knees, underwent orthopedic surgery 5/12, followed by infectious disease and discharged from hospital on IV ceftriaxone 5/18, who was directly admitted 6/8 for operative intervention after failing outpatient IV antibiotics.   Clinical Impression   Pt present with severe pain and weakness from above limiting his ability to perform his ADLs and transfers independently. Pt prior to admission pt was at a SNF for rehab following surgery and is now a total assist for LB dressing/bathing, bed mobility and toileting. Pt was educated in the importance of moving and getting OOB but was in too much pain to do so. Pt was a total assist to get to EOB and then was positioned back into bed for echo at the end of treatment. Will continue to follow.    Follow Up Recommendations  SNF    Equipment Recommendations   (TBD at next venue)       Precautions / Restrictions Precautions Precautions: Fall Required Braces or Orthoses: Knee Immobilizer - Right Knee Immobilizer - Right: On at all times Restrictions Weight Bearing Restrictions: No RLE Weight Bearing: Weight bearing as tolerated LLE Weight Bearing: Weight bearing as tolerated      Mobility Bed Mobility Overal bed mobility: Needs Assistance Bed Mobility: Supine to Sit     Supine to sit: HOB elevated;+2 for physical assistance;+2 for safety/equipment;Max assist     General bed mobility comments: Pt required total A +2 to help move bil. LE over EOB and assistance to raise trunk up to vertical for supine to sit. Pt was screaming during movement due to pain.  Transfers       General transfer comment: Pt was unable to do transfer due to pain and echo appointment.    Balance Overall balance assessment: Needs  assistance Sitting-balance support: Bilateral upper extremity supported Sitting balance-Leahy Scale: Poor     Standing balance support: Bilateral upper extremity supported;During functional activity Standing balance-Leahy Scale: Zero                              ADL Overall ADL's : Needs assistance/impaired Eating/Feeding: Set up Eating/Feeding Details (indicate cue type and reason): pt is on a liquid diet  Grooming: Sitting;Set up   Upper Body Bathing: Minimal assitance;Sitting   Lower Body Bathing: Sit to/from stand;+2 for physical assistance;+2 for safety/equipment;Maximal assistance   Upper Body Dressing : Minimal assistance;Sitting   Lower Body Dressing: +2 for physical assistance;Sit to/from stand;+2 for safety/equipment;Maximal assistance   Toilet Transfer: +2 for physical assistance;BSC;Stand-pivot;+2 for safety/equipment;Maximal assistance   Toileting- Clothing Manipulation and Hygiene: Sit to/from stand;+2 for physical assistance;+2 for safety/equipment;Maximal assistance       Functional mobility during ADLs: +2 for physical assistance;+2 for safety/equipment;Maximal assistance General ADL Comments: Pt c/o a tremendous amount of pain with activity. Pt was able to lift his LEs to get to EOB and required assistance for his UB to get to a upright position. He has been going to rehab at [facility] and will be d/c to SNF, but hasn't gotten out of bed since surgery. Pt was encouraged to get to EOB and OOB to chair.                Pertinent Vitals/Pain Pt stated his pain was a 8/10 at the beginning of tx. Pt was repositioned  and nurse was notified of pain at the end of tx.     Hand Dominance Right   Extremity/Trunk Assessment Upper Extremity Assessment Upper Extremity Assessment: Generalized weakness   Cervical / Trunk Assessment Cervical / Trunk Assessment: Kyphotic   Communication Communication Communication: No difficulties   Cognition  Arousal/Alertness: Awake/alert Behavior During Therapy: Anxious Overall Cognitive Status: Within Functional Limits for tasks assessed Area of Impairment: Following commands;Attention   Current Attention Level: Selective Memory: Decreased short-term memory Following Commands: Follows one step commands inconsistently;Follows one step commands with increased time       General Comments: Pt required repeated verbal commands and questions to respond. Pt was having difficulty distinguishing his L from his R LE during activity and thought we were "messing with him."              Home Living Family/patient expects to be discharged to:: Skilled nursing facility Living Arrangements: Spouse/significant other Available Help at Discharge: Hundred                             Additional Comments: Pt reports that he was not able to ambulate at the SNF and was barely able to get out of the bed. States he spent most of his time in the bed there.       Prior Functioning/Environment Level of Independence: Needs assistance  Gait / Transfers Assistance Needed: Staff assist at SNF for all mobility ADL's / Homemaking Assistance Needed: Staff assist at SNF for all ADL's   Comments: pt has been living in snf prior to admission    OT Diagnosis: Generalized weakness;Cognitive deficits;Acute pain   OT Problem List: Decreased strength;Decreased range of motion;Decreased activity tolerance;Decreased knowledge of use of DME or AE;Impaired balance (sitting and/or standing);Decreased cognition;Pain   OT Treatment/Interventions: Self-care/ADL training;Patient/family education;Balance training;DME and/or AE instruction    OT Goals(Current goals can be found in the care plan section) Acute Rehab OT Goals Patient Stated Goal: none stated ADL Goals Pt Will Perform Lower Body Dressing: with mod assist;with adaptive equipment;sitting/lateral leans Pt Will Transfer to Toilet: bedside  commode;stand pivot transfer;with mod assist Additional ADL Goal #1: Pt will perform bed mobility with mod A as percursor for ADLs  OT Frequency: Min 2X/week    End of Session Equipment Utilized During Treatment: Right knee immobilizer CPM Left Knee CPM Left Knee: Off CPM Right Knee CPM Right Knee: Off Nurse Communication: Mobility status  Activity Tolerance: Patient limited by pain Patient left: in bed;with call bell/phone within reach   Time:  -    Charges:    G-Codes:    Lyda Perone 2014/05/06, 5:54 PM

## 2014-04-27 NOTE — Progress Notes (Signed)
ANTIBIOTIC CONSULT NOTE - INITIAL  Pharmacy Consult for Vancomycin  Indication: Bacteremia, GPC in clusters from 6/8  No Known Allergies  Patient Measurements: Height: 5\' 8"  (172.7 cm) Weight: 163 lb (73.936 kg) IBW/kg (Calculated) : 68.4  Vital Signs: Temp: 98.5 F (36.9 C) (06/10 0012) Temp src: Oral (06/10 0012) BP: 112/77 mmHg (06/10 0012) Pulse Rate: 118 (06/10 0012)  Labs:  Recent Labs  04/25/14 1800 04/26/14 0505  WBC 8.7 8.9  HGB 8.1* 8.8*  PLT 514* 588*  CREATININE 0.66 0.68   Estimated Creatinine Clearance: 80.8 ml/min (by C-G formula based on Cr of 0.68).   Microbiology: Recent Results (from the past 720 hour(s))  ANAEROBIC CULTURE     Status: None   Collection Time    03/29/14  4:01 PM      Result Value Ref Range Status   Specimen Description SYNOVIAL FLUID KNEE LEFT   Final   Special Requests 10ML FLUID   Final   Gram Stain     Final   Value: ABUNDANT WBC PRESENT, PREDOMINANTLY PMN     NO ORGANISMS SEEN     Performed at [facility]   Culture     Final   Value: NO ANAEROBES ISOLATED     Performed at [facility]   Report Status 04/03/2014 FINAL   Final  BODY FLUID CULTURE     Status: None   Collection Time    03/29/14  4:01 PM      Result Value Ref Range Status   Specimen Description SYNOVIAL FLUID KNEE LEFT   Final   Special Requests 10ML FLUID   Final   Gram Stain     Final   Value: ABUNDANT WBC PRESENT, PREDOMINANTLY PMN     NO ORGANISMS SEEN     Performed at [facility]   Culture     Final   Value: NO GROWTH 3 DAYS     Performed at [facility]   Report Status 04/02/2014 FINAL   Final  ANAEROBIC CULTURE     Status: None   Collection Time    03/29/14  4:23 PM      Result Value Ref Range Status   Specimen Description FLUID SYNOVIAL RIGHT KNEE   Final   Special Requests SYRINGE   Final   Gram Stain     Final   Value: ABUNDANT WBC PRESENT, PREDOMINANTLY PMN     NO ORGANISMS SEEN     Performed at  [facility]   Culture     Final   Value: NO ANAEROBES ISOLATED     Performed at [facility]   Report Status 04/03/2014 FINAL   Final  BODY FLUID CULTURE     Status: None   Collection Time    03/29/14  4:23 PM      Result Value Ref Range Status   Specimen Description FLUID SYNOVIAL RIGHT KNEE   Final   Special Requests SYRINGE   Final   Gram Stain     Final   Value: ABUNDANT WBC PRESENT, PREDOMINANTLY PMN     NO ORGANISMS SEEN     Performed at [facility]   Culture     Final   Value: NO GROWTH 3 DAYS     Performed at [facility]   Report Status 04/02/2014 FINAL   Final  ANAEROBIC CULTURE     Status: None   Collection Time    03/29/14  4:25 PM  Result Value Ref Range Status   Specimen Description FLUID SYNOVIAL RIGHT KNEE   Final   Special Requests ON SWAB   Final   Gram Stain     Final   Value: ABUNDANT WBC PRESENT, PREDOMINANTLY PMN     NO ORGANISMS SEEN     Performed at [facility]   Culture     Final   Value: NO ANAEROBES ISOLATED     Performed at [facility]   Report Status 04/03/2014 FINAL   Final  BODY FLUID CULTURE     Status: None   Collection Time    03/29/14  4:25 PM      Result Value Ref Range Status   Specimen Description FLUID SYNOVIAL RIGHT KNEE   Final   Special Requests ON SWAB   Final   Gram Stain     Final   Value: ABUNDANT WBC PRESENT, PREDOMINANTLY PMN     NO ORGANISMS SEEN     Performed at [facility]   Culture     Final   Value: RARE VIRIDANS STREPTOCOCCUS     Performed at [facility]   Report Status 04/04/2014 FINAL   Final   Organism ID, Bacteria VIRIDANS STREPTOCOCCUS   Final  CULTURE, BLOOD (ROUTINE X 2)     Status: None   Collection Time    04/03/14 12:45 PM      Result Value Ref Range Status   Specimen Description BLOOD LEFT ARM   Final   Special Requests BOTTLES DRAWN AEROBIC ONLY 10CC   Final   Culture  Setup Time     Final   Value: 04/03/2014 22:02      Performed at [facility]   Culture     Final   Value: NO GROWTH 5 DAYS     Performed at [facility]   Report Status 04/09/2014 FINAL   Final  CULTURE, BLOOD (ROUTINE X 2)     Status: None   Collection Time    04/03/14 12:50 PM      Result Value Ref Range Status   Specimen Description BLOOD LEFT ARM   Final   Special Requests BOTTLES DRAWN AEROBIC ONLY 10CC   Final   Culture  Setup Time     Final   Value: 04/03/2014 22:02     Performed at [facility]   Culture     Final   Value: NO GROWTH 5 DAYS     Performed at [facility]   Report Status 04/09/2014 FINAL   Final  CULTURE, BLOOD (ROUTINE X 2)     Status: None   Collection Time    04/25/14  6:35 PM      Result Value Ref Range Status   Specimen Description BLOOD LEFT ARM   Final   Special Requests     Final   Value: BOTTLES DRAWN AEROBIC AND ANAEROBIC BLUE 2.5CC RED 1CC   Culture  Setup Time     Final   Value: 04/25/2014 22:46     Performed at [facility]   Culture     Final   Value: GRAM POSITIVE COCCI IN CLUSTERS     9 Note: Gram Stain Report Called to,Read Back By and Verified With: Rosalie Gums RN EDMOJ [id number] [phone] [id number]     Performed at [facility]   Report Status PENDING   Incomplete  BODY FLUID CULTURE     Status: None   Collection Time  04/26/14 12:08 PM      Result Value Ref Range Status   Specimen Description SYNOVIAL RIGHT KNEE   Final   Special Requests FLUID ON SWAB   Final   Gram Stain     Final   Value: WBC PRESENT, PREDOMINANTLY MONONUCLEAR     NO ORGANISMS SEEN     Performed at [facility]   Culture PENDING   Incomplete   Report Status PENDING   Incomplete  ANAEROBIC CULTURE     Status: None   Collection Time    04/26/14 12:08 PM      Result Value Ref Range Status   Specimen Description SYNOVIAL RIGHT KNEE   Final   Special Requests FLUID ON SWAB   Final   Gram Stain     Final   Value: RARE WBC PRESENT, PREDOMINANTLY MONONUCLEAR     NO  SQUAMOUS EPITHELIAL CELLS SEEN     NO ORGANISMS SEEN     Performed at [facility]   Culture PENDING   Incomplete   Report Status PENDING   Incomplete  BODY FLUID CULTURE     Status: None   Collection Time    04/26/14  1:27 PM      Result Value Ref Range Status   Specimen Description SYNOVIAL FLUID LEFT KNEE   Final   Special Requests SYNOVIAL FLUID LEFT KNEE ON SWAB   Final   Gram Stain     Final   Value: WBC PRESENT,BOTH PMN AND MONONUCLEAR     NO ORGANISMS SEEN     Performed at [facility]   Culture PENDING   Incomplete   Report Status PENDING   Incomplete  ANAEROBIC CULTURE     Status: None   Collection Time    04/26/14  1:27 PM      Result Value Ref Range Status   Specimen Description SYNOVIAL FLUID LEFT KNEE   Final   Special Requests SYNOVIAL FLUID LEFT KNEE ON SWAB   Final   Gram Stain     Final   Value: RARE WBC PRESENT,BOTH PMN AND MONONUCLEAR     NO SQUAMOUS EPITHELIAL CELLS SEEN     NO ORGANISMS SEEN     Performed at [facility]   Culture PENDING   Incomplete   Report Status PENDING   Incomplete    Medical History: Past Medical History  Diagnosis Date  . Attention deficit disorder   . Diabetes mellitus without complication   . Hyperlipidemia   . Hypertension   . Blind left eye     Assessment: 73 y/o M to start vancomycin per pharmacy for bacteremia (GPC in clusters). WBC wnl, Tmax 99.5, renal function appropriate for age.   Goal of Therapy:  Vancomycin trough level 15-20 mcg/ml  Plan:  -Vancomycin 1000 mg IV q12h -Trend WBC, temp, renal function  -F/U cultures for speciation/sensi -Drug levels as indicated   Narda Bonds 04/27/2014,3:01 AM

## 2014-04-27 NOTE — Op Note (Signed)
NAMEEWART, CARRERA NO.:  [id number]  MEDICAL RECORD NO.:  [id number]  LOCATION:  [id number]                        FACILITY:  [facility]  PHYSICIAN:  Anderson Malta, M.D.    DATE OF BIRTH:  May 18, 1941  DATE OF PROCEDURE:  04/26/2014 DATE OF DISCHARGE:                              OPERATIVE REPORT   PREOPERATIVE DIAGNOSIS:  Bilateral knee infection.  POSTOPERATIVE DIAGNOSIS:  Bilateral knee infection.  PROCEDURE:  Bilateral knee open arthrotomy, synovectomy, antibiotic bead placement, and manipulation under anesthesia of the right knee.  SURGEON:  Anderson Malta, M.D.  ASSISTANT:  Kathee Delton, RNFA.  INDICATIONS:  Mordecai Tindol is a 73 year old patient with bilateral knee infection, who presents for operative management after explanation of risks, benefits, failure of initially arthroscopic debridement and IV antibiotics.  SPECIMENS:  Bilateral cultures obtained from each knee, synovium sent to Pathology in each knee.  PROCEDURE IN DETAIL:  The patient was brought to the operating room where general anesthetic was induced.  Preoperative antibiotics administered.  Time-out was called.  Tourniquet was not utilized because of the patient's history of deep vein thrombosis.  Both legs were prescrubbed with alcohol and Betadine, allowed to air dry.  Prepped with DuraPrep solution and draped in a sterile manner.  Time-out was called from both legs.  Right leg had a flexion contracture of about 30 degrees, which was straightened out to about 5 degrees.  At this time, the right leg arthrotomy was made median parapatellar from anterior approach to the knee.  Skin and subcutaneous tissues were sharply divided.  Bleeding points were encountered and controlled with electrocautery.  Complete synovectomy was performed.  Thorough irrigation was then performed with 3 L of irrigating solution.  Cultures were obtained after the arthrotomy.  In general, in the right knee,  the patient had significant arthritis.  This was in all 3 compartments. There was no evidence of osteomyelitis however.  Following a thorough excisional debridement, scraping of the synovium with curettes and 5 L of irrigating solution, thrombin spray was placed into the knee. Antibiotic beads with vancomycin and gentamicin per pharmacy consult 2 g total vancomycin 500 mg total gentamicin were utilized in divided doses between each knee.  The arthrotomy was then closed using interrupted inverted #1 Vicryl suture, 0-Vicryl suture, 2-0 Vicryl suture, and skin staples.  In a similar fashion, an arthrotomy was made in the left knee. Anterior approach was made.  Median parapatellar arthrotomy was made. Patella was everted.  Complete synovectomy was performed.  In general, in both knees there was necrotic-appearing and infected-appearing fat within the suprapatellar pouch.  Following complete synovectomy again and curettage of all soft tissue surfaces, the knee joint was irrigated with 5 L of irrigating solution.  Antibiotic beads were placed after placing thrombin spray into the knee joint.  Antibiotic beads were placed in the knee joint and closed using interrupted inverted #1 Vicryl suture, 0-Vicryl suture, 2-0 Vicryl suture, and skin staples.  Solution of Marcaine, morphine, and clonidine were injected into both knees for postop pain control.  On the right-hand side, a well-padded posterior splint placed posteriorly along with knee immobilizer to maintain full extension.  The patient tolerated the  procedure well without immediate complications. Total blood loss approximately 250 mL.  We will restart heparin in approximately 8 hours.     Anderson Malta, M.D.     GSD/MEDQ  D:  04/26/2014  T:  04/27/2014  Job:  [id number]

## 2014-04-27 NOTE — Clinical Social Work Psychosocial (Signed)
Clinical Social Work Department  BRIEF PSYCHOSOCIAL ASSESSMENT  Patient: [name]  Account Number: [id number]  Admit date: 04/25/14 Clinical Social Worker Rhea Pink, MSW Date/Time: 04/27/2014 11:00 AM Referred by: Physician Date Referred: NA Referred for   From Facility-[facility]  Other Referral:  Interview type: Patient and patient's wife at bedsdie Other interview type: PSYCHOSOCIAL DATA  Living Status:spouse Admitted from facility: [facility] Level of care: SNF Primary support name: Brooks Sailors Primary support relationship to patient:  Degree of support available:  Strong and vested  CURRENT CONCERNS  Current Concerns   Post-Acute Placement   Other Concerns:  SOCIAL WORK ASSESSMENT / PLAN  CSW met with pt and patient's wife at bedside to offer support and discuss returning to SNF. Both Patient and patient's wife are agreeable to patient returning to [facility]. Both Thanked CSW for visit and support providedre: PT recommendation for SNF.   Pt lives with spouse      CSW updated FL2 and sent clinicals to [place].     Assessment/plan status: Information/Referral to [employer]  Other assessment/ plan:  Information/referral to community resources:  SNF   PTAR  PATIENT'S/FAMILY'S RESPONSE TO PLAN OF CARE:  Pt  reports he is agreeable to [facility] in order to increase strength and independence with mobility prior to returning home  Pt verbalized understanding of placement process and appreciation for CSW assist.   Rhea Pink, MSW, [place]

## 2014-04-27 NOTE — Consult Note (Signed)
[facility]  Total days of antibiotics 2        Day 2 vanco               Reason for Consult: septic arthritis   Referring Physician: goodrich  Active Problems:   Essential hypertension   Diabetes mellitus type II, controlled   Hyperlipidemia   Bacterial infection of knee joint   DVT of lower extremity, bilateral   Septic joint    HPI: [name] is a 73 y.o. male  R knee pain that developed in late April.He underwent aspiration at that time which revealed crystals c/w gout and he received a steroid injection into his knee. He improved but then developed worsening swelling and pain of his knee. He had repeat arthrocentesis with increased WBC but no crystals, thus developed bilateral septic arthritis. He underwent bilateral I x D on 5/12. Cultures from Right knee did show strep viridans (PCN S 0.094)but left cx no growth todate. He had complication of developing bilaterally DVt thus he was discharged on ceftriaxone for 6 wk plus xarelto. He followed up with Dr. Marlou Sa on 6/8 who repeated aspiration of both knees that still showed persistently elevated leukocytosis of 50K. (unclear if sent for culture). He was admitted due to failing IV therapy requiring repeat surgical debridement. On 6/9, he underwent open arthrotomy-synevectomy with antibiotics beads bilaterally. He denies fever chills, n/v/diarrhea/rash from antibiotics No proximal swelling or erythema.    Past Medical History  Diagnosis Date  . Attention deficit disorder   . Diabetes mellitus without complication   . Hyperlipidemia   . Hypertension   . Blind left eye     Allergies: No Known Allergies   MEDICATIONS: . baclofen  10 mg Oral TID  . cefTRIAXone (ROCEPHIN)  IV  1 g Intravenous Once  . HYDROcodone-acetaminophen  1 tablet Oral TID WC  . HYDROcodone-acetaminophen  2 tablet Oral QHS  . insulin aspart  0-15 Units Subcutaneous TID WC  . insulin aspart  0-5 Units Subcutaneous QHS  .  lactobacillus acidophilus  1 tablet Oral BID  . lactose free nutrition  237 mL Oral TID WC  . lisinopril  20 mg Oral Daily  . rivaroxaban  20 mg Oral Q breakfast  . sertraline  12.5 mg Oral Daily  . [START ON 05/01/2014] sertraline  25 mg Oral Daily  . simvastatin  20 mg Oral q1800  . sodium chloride  3 mL Intravenous Q12H  . sodium chloride  3 mL Intravenous Q12H  . vancomycin  1,000 mg Intravenous Q12H  . [START ON 04/29/2014] Vitamin D (Ergocalciferol)  50,000 Units Oral Once per day on Mon Fri    History  Substance Use Topics  . Smoking status: Former Research scientist (life sciences)  . Smokeless tobacco: Not on file  . Alcohol Use: No    Family History  Problem Relation Age of Onset  . Heart attack Father   . Heart attack Sister   . Heart attack Brother      Review of Systems  Constitutional: Negative for fever, chills, diaphoresis, decreased activity change, decreased appetite change, fatigue and unexpected weight change.  HENT: Negative for congestion, sore throat, rhinorrhea, sneezing, trouble swallowing and sinus pressure.  Eyes: Negative for photophobia and visual disturbance.  Respiratory: Negative for cough, chest tightness, shortness of breath, wheezing and stridor.  Cardiovascular: Negative for chest pain, palpitations and leg swelling.  Gastrointestinal: Negative for nausea, vomiting, abdominal pain, diarrhea, constipation, blood in stool, abdominal  distention and anal bleeding.  Genitourinary: Negative for dysuria, hematuria, flank pain and difficulty urinating.  Musculoskeletal: Negative for myalgias, back pain, joint swelling, arthralgias and gait problem.  Skin: Negative for color change, pallor, rash and wound.  Neurological: Negative for dizziness, tremors, weakness and light-headedness.  Hematological: Negative for adenopathy. Does not bruise/bleed easily.  Psychiatric/Behavioral: Negative for behavioral problems, confusion, sleep disturbance, dysphoric mood, decreased concentration  and agitation.     OBJECTIVE: Temp:  [98.5 F (36.9 C)-98.9 F (37.2 C)] 98.7 F (37.1 C) (06/10 1300) Pulse Rate:  [116-130] 116 (06/10 1300) Resp:  [16-18] 16 (06/10 1300) BP: (112-135)/(6-77) 124/55 mmHg (06/10 1300) SpO2:  [98 %-100 %] 100 % (06/10 1300)  Constitutional: He is oriented to person, place, and time. He appears well-developed and well-nourished. No distress.  HENT:  Mouth/Throat: Oropharynx is clear and moist. No oropharyngeal exudate.  Cardiovascular: Normal rate, regular rhythm and normal heart sounds. Exam reveals no gallop and no friction rub.  No murmur heard.  Pulmonary/Chest: Effort normal and breath sounds normal. No respiratory distress. He has no wheezes.  Abdominal: Soft. Bowel sounds are normal. He exhibits no distension. There is no tenderness.  Lymphadenopathy:  He has no cervical adenopathy.  Neurological: He is alert and oriented to person, place, and time.  Ext: knees are wrapped from thigh to toe from recent surgery Skin: Skin is warm and dry. No rash noted. No erythema.  Psychiatric: He has a normal mood and affect. His behavior is normal.    LABS: Results for orders placed during the hospital encounter of 04/25/14 (from the past 48 hour(s))  CULTURE, BLOOD (ROUTINE X 2)     Status: None   Collection Time    04/25/14  6:35 PM      Result Value Ref Range   Specimen Description BLOOD LEFT ARM     Special Requests       Value: BOTTLES DRAWN AEROBIC AND ANAEROBIC BLUE 2.5CC RED 1CC   Culture  Setup Time       Value: 04/25/2014 22:46     Performed at [facility]   Culture       Value: [place] IN CLUSTERS     9 Note: Gram Stain Report Called to,Read Back By and Verified With: Rosalie Gums RN EDMOJ [phone]     Performed at [facility]   Report Status PENDING    CULTURE, BLOOD (ROUTINE X 2)     Status: None   Collection Time    04/25/14  7:30 PM      Result Value Ref Range   Specimen Description BLOOD LEFT  ARM     Special Requests BOTTLES DRAWN AEROBIC AND ANAEROBIC 10CC     Culture  Setup Time       Value: 04/26/2014 02:29     Performed at [facility]   Culture       Value:        BLOOD CULTURE RECEIVED NO GROWTH TO DATE CULTURE WILL BE HELD FOR 5 DAYS BEFORE ISSUING A FINAL NEGATIVE REPORT     Performed at [facility]   Report Status PENDING    GLUCOSE, CAPILLARY     Status: Abnormal   Collection Time    04/25/14  9:22 PM      Result Value Ref Range   Glucose-Capillary 142 (*) 70 - 99 mg/dL  BASIC METABOLIC PANEL     Status: Abnormal   Collection Time  04/26/14  5:05 AM      Result Value Ref Range   Sodium 135 (*) 137 - 147 mEq/L   Potassium 4.2  3.7 - 5.3 mEq/L   Chloride 94 (*) 96 - 112 mEq/L   CO2 24  19 - 32 mEq/L   Glucose, Bld 116 (*) 70 - 99 mg/dL   BUN 8  6 - 23 mg/dL   Creatinine, Ser 0.68  0.50 - 1.35 mg/dL   Calcium 9.5  8.4 - 10.5 mg/dL   GFR calc non Af Amer >90  >90 mL/min   GFR calc Af Amer >90  >90 mL/min   Comment: (NOTE)     The eGFR has been calculated using the CKD EPI equation.     This calculation has not been validated in all clinical situations.     eGFR's persistently <90 mL/min signify possible Chronic Kidney     Disease.  CBC     Status: Abnormal   Collection Time    04/26/14  5:05 AM      Result Value Ref Range   WBC 8.9  4.0 - 10.5 K/uL   RBC 3.26 (*) 4.22 - 5.81 MIL/uL   Hemoglobin 8.8 (*) 13.0 - 17.0 g/dL   HCT 27.7 (*) 39.0 - 52.0 %   MCV 85.0  78.0 - 100.0 fL   MCH 27.0  26.0 - 34.0 pg   MCHC 31.8  30.0 - 36.0 g/dL   RDW 14.6  11.5 - 15.5 %   Platelets 588 (*) 150 - 400 K/uL  HEPARIN LEVEL (UNFRACTIONATED)     Status: None   Collection Time    04/26/14  5:05 AM      Result Value Ref Range   Heparin Unfractionated 0.31  0.30 - 0.70 IU/mL   Comment:            IF HEPARIN RESULTS ARE BELOW     EXPECTED VALUES, AND PATIENT     DOSAGE HAS BEEN CONFIRMED,     SUGGEST FOLLOW UP TESTING     OF ANTITHROMBIN III  LEVELS.  APTT     Status: Abnormal   Collection Time    04/26/14  5:05 AM      Result Value Ref Range   aPTT 62 (*) 24 - 37 seconds   Comment:            IF BASELINE aPTT IS ELEVATED,     SUGGEST PATIENT RISK ASSESSMENT     BE USED TO DETERMINE APPROPRIATE     ANTICOAGULANT THERAPY.  GLUCOSE, CAPILLARY     Status: Abnormal   Collection Time    04/26/14  6:41 AM      Result Value Ref Range   Glucose-Capillary 113 (*) 70 - 99 mg/dL  GLUCOSE, CAPILLARY     Status: None   Collection Time    04/26/14 10:18 AM      Result Value Ref Range   Glucose-Capillary 98  70 - 99 mg/dL  BODY FLUID CULTURE     Status: None   Collection Time    04/26/14 12:08 PM      Result Value Ref Range   Specimen Description SYNOVIAL RIGHT KNEE     Special Requests FLUID ON SWAB     Gram Stain       Value: WBC PRESENT, PREDOMINANTLY MONONUCLEAR     NO ORGANISMS SEEN     Performed at [facility]   Culture       Value:  NO GROWTH 1 DAY     Performed at [facility]   Report Status PENDING    ANAEROBIC CULTURE     Status: None   Collection Time    04/26/14 12:08 PM      Result Value Ref Range   Specimen Description SYNOVIAL RIGHT KNEE     Special Requests FLUID ON SWAB     Gram Stain       Value: RARE WBC PRESENT, PREDOMINANTLY MONONUCLEAR     NO SQUAMOUS EPITHELIAL CELLS SEEN     NO ORGANISMS SEEN     Performed at [facility]   Culture       Value: NO ANAEROBES ISOLATED; CULTURE IN PROGRESS FOR 5 DAYS     Performed at [facility]   Report Status PENDING    BODY FLUID CULTURE     Status: None   Collection Time    04/26/14  1:27 PM      Result Value Ref Range   Specimen Description SYNOVIAL FLUID LEFT KNEE     Special Requests SYNOVIAL FLUID LEFT KNEE ON SWAB     Gram Stain       Value: WBC PRESENT,BOTH PMN AND MONONUCLEAR     NO ORGANISMS SEEN     Performed at [facility]   Culture       Value: NO GROWTH 1 DAY     Performed at [facility]    Report Status PENDING    ANAEROBIC CULTURE     Status: None   Collection Time    04/26/14  1:27 PM      Result Value Ref Range   Specimen Description SYNOVIAL FLUID LEFT KNEE     Special Requests SYNOVIAL FLUID LEFT KNEE ON SWAB     Gram Stain       Value: RARE WBC PRESENT,BOTH PMN AND MONONUCLEAR     NO SQUAMOUS EPITHELIAL CELLS SEEN     NO ORGANISMS SEEN     Performed at [facility]   Culture       Value: NO ANAEROBES ISOLATED; CULTURE IN PROGRESS FOR 5 DAYS     Performed at [facility]   Report Status PENDING    GLUCOSE, CAPILLARY     Status: Abnormal   Collection Time    04/26/14  2:57 PM      Result Value Ref Range   Glucose-Capillary 126 (*) 70 - 99 mg/dL   Comment 1 Notify RN    GLUCOSE, CAPILLARY     Status: Abnormal   Collection Time    04/26/14  5:20 PM      Result Value Ref Range   Glucose-Capillary 153 (*) 70 - 99 mg/dL  GLUCOSE, CAPILLARY     Status: Abnormal   Collection Time    04/26/14 10:11 PM      Result Value Ref Range   Glucose-Capillary 189 (*) 70 - 99 mg/dL  CBC     Status: Abnormal   Collection Time    04/27/14  4:31 AM      Result Value Ref Range   WBC 12.5 (*) 4.0 - 10.5 K/uL   RBC 2.71 (*) 4.22 - 5.81 MIL/uL   Hemoglobin 7.4 (*) 13.0 - 17.0 g/dL   HCT 23.1 (*) 39.0 - 52.0 %   MCV 85.2  78.0 - 100.0 fL   MCH 27.3  26.0 - 34.0 pg   MCHC 32.0  30.0 - 36.0 g/dL   RDW  14.6  11.5 - 15.5 %   Platelets 541 (*) 150 - 400 K/uL  IRON AND TIBC     Status: Abnormal   Collection Time    04/27/14  4:31 AM      Result Value Ref Range   Iron <10 (*) 42 - 135 ug/dL   TIBC Not calculated due to Iron <10.  215 - 435 ug/dL   Saturation Ratios Not calculated due to Iron <10.  20 - 55 %   UIBC 134  125 - 400 ug/dL   Comment: Performed at [place], CAPILLARY     Status: Abnormal   Collection Time    04/27/14  6:41 AM      Result Value Ref Range   Glucose-Capillary 194 (*) 70 - 99 mg/dL  GLUCOSE, CAPILLARY      Status: Abnormal   Collection Time    04/27/14 11:07 AM      Result Value Ref Range   Glucose-Capillary 223 (*) 70 - 99 mg/dL   Comment 1 Notify RN    GLUCOSE, CAPILLARY     Status: Abnormal   Collection Time    04/27/14  4:02 PM      Result Value Ref Range   Glucose-Capillary 141 (*) 70 - 99 mg/dL   Comment 1 Notify RN      MICRO: 6/8 blood cx ngtd 6/9 wound cx pending IMAGING: No results found.   Assessment/Plan:  73yo m with hx of bilateral septic arthritis with strep viridans who has repeat IXD with antibiotics spacers. New cultures pending  - recommend to treat with vancomycin and ceftriaxone and await culture results to see if need to change therapy - recommend panorex of teeth if we have ongoing strep viridans identified +/- TEE. - will likely need to restart 6 wk antibiotic clock using 6/10 as first of 42days of therapy  Jess Sulak B. [facility] [phone]

## 2014-04-27 NOTE — Evaluation (Signed)
Physical Therapy Evaluation Patient Details Name: [name] MRN: [id number] DOB: 1941/09/18 Today's Date: 04/27/2014   History of Present Illness  73 year old man with history of septic arthritis bilateral knees, underwent orthopedic surgery 5/12, followed by infectious disease and discharged from hospital on IV ceftriaxone 5/18, who was directly admitted 6/8 for operative intervention after failing outpatient IV antibiotics.  Clinical Impression  This patient presents with acute pain and decreased functional independence following the above mentioned procedure. At the time of PT eval, pt was limited mostly by pain and fear of pain. Was able to transfer to the recliner with max +2 assist. This patient is appropriate for skilled PT interventions to address functional limitations, improve safety and independence with functional mobility, and return to PLOF.     Follow Up Recommendations SNF;Supervision/Assistance - 24 hour    Equipment Recommendations  None recommended by PT    Recommendations for Other Services       Precautions / Restrictions Precautions Precautions: Fall Required Braces or Orthoses: Knee Immobilizer - Right Knee Immobilizer - Right: On at all times Restrictions Weight Bearing Restrictions: No RLE Weight Bearing: Weight bearing as tolerated LLE Weight Bearing: Weight bearing as tolerated      Mobility  Bed Mobility Overal bed mobility: Needs Assistance Bed Mobility: Supine to Sit     Supine to sit: HOB elevated;+2 for physical assistance;Total assist;+2 for safety/equipment     General bed mobility comments: Pt required total A +2 to help move bil. LE over EOB and help to raise trunk up to vertical for supine to sit. Pt was screaming during movement due to pain.  Transfers Overall transfer level: Needs assistance Equipment used: Rolling walker (2 wheeled) Transfers: Sit to/from [facility] Sit to Stand: Max assist;+2 physical  assistance Stand pivot transfers: +2 physical assistance;Max assist       General transfer comment: Pt was unable to do transfer due to pain and echo appointment.  Ambulation/Gait             General Gait Details: Unable to attempt at this time  Stairs            Wheelchair Mobility    Modified Rankin (Stroke Patients Only)       Balance Overall balance assessment: Needs assistance Sitting-balance support: Bilateral upper extremity supported Sitting balance-Leahy Scale: Poor     Standing balance support: Bilateral upper extremity supported;During functional activity Standing balance-Leahy Scale: Zero                               Pertinent Vitals/Pain Vitals stable throughout session. RN provided pain medication at end of session.     Home Living Family/patient expects to be discharged to:: Skilled nursing facility Living Arrangements: Spouse/significant other Available Help at Discharge: [place]             Additional Comments: Pt reports that he was not able to ambulate at the SNF and was barely able to get out of the bed. States he spent most of his time in the bed there.     Prior Function Level of Independence: Needs assistance   Gait / Transfers Assistance Needed: Staff assist at SNF for all mobility  ADL's / Homemaking Assistance Needed: Staff assist at SNF for all ADL's  Comments: pt has been living in snf prior to admission     Hand Dominance   Dominant Hand: Right    Extremity/Trunk  Assessment   Upper Extremity Assessment: Generalized weakness           Lower Extremity Assessment: Defer to PT evaluation RLE Deficits / Details: Pain and decreased AROM consistent with procedure mentioned above LLE Deficits / Details: Pain and decreased AROM consistent with procedure mentioned above  Cervical / Trunk Assessment: Kyphotic  Communication   Communication: No difficulties  Cognition Arousal/Alertness:  Awake/alert Behavior During Therapy: Anxious Overall Cognitive Status: Within Functional Limits for tasks assessed Area of Impairment: Following commands     Memory: Decreased short-term memory Following Commands: Follows one step commands inconsistently;Follows one step commands with increased time       General Comments: Pt required repeated verbal commands and questions to respond.    General Comments      Exercises        Assessment/Plan    PT Assessment Patient needs continued PT services  PT Diagnosis Difficulty walking;Acute pain   PT Problem List Decreased strength;Decreased range of motion;Decreased activity tolerance;Decreased balance;Decreased mobility;Decreased knowledge of use of DME;Decreased safety awareness;Decreased knowledge of precautions;Pain  PT Treatment Interventions DME instruction;Gait training;Functional mobility training;Therapeutic activities;Therapeutic exercise;Balance training;Neuromuscular re-education;Wheelchair mobility training;Patient/family education   PT Goals (Current goals can be found in the Care Plan section) Acute Rehab PT Goals Patient Stated Goal: none stated PT Goal Formulation: With patient Time For Goal Achievement: 05/11/14 Potential to Achieve Goals: Fair    Frequency Min 3X/week   Barriers to discharge        Co-evaluation               End of Session Equipment Utilized During Treatment: Gait belt;Oxygen Activity Tolerance: Patient limited by pain Patient left: in chair;with call bell/phone within reach;with family/visitor present Nurse Communication: Mobility status         Time: 1535-1610 PT Time Calculation (min): 35 min   Charges:   PT Evaluation $Initial PT Evaluation Tier I: 1 Procedure PT Treatments $Gait Training: 8-22 mins $Therapeutic Activity: 8-22 mins   PT G Codes:          Jolyn Lent 04/27/2014, 5:04 PM  Jolyn Lent, PT, DPT Acute Rehabilitation Services Pager:  [phone]

## 2014-04-27 NOTE — Progress Notes (Addendum)
PROGRESS NOTE  [name] WJX:[id number] DOB: November 30, 1940 DOA: 04/25/2014 PCP: Tawanna Solo, MD  Summary: 73 year old man with history of septic arthritis bilateral knees, underwent orthopedic surgery 5/12, followed by infectious disease and discharged from hospital on IV ceftriaxone 5/18, who was directly admitted 6/8 for operative intervention after failing outpatient IV antibiotics.  Assessment/Plan: 1. Septic arthritis bilateral knees: Persistent. Status post surgery 6/9. 2. Acute blood loss anemia. Presumed perioperative in nature.  3. GPC bacteremia 1/2.possible contaminant.  4. BLE DVT diagnosed 5/15 on previous hospitalization.  5. DM type 2. Stable, continue SSI. 6. HTN   Continue management per orthopedics, empiric antibiotics, followup cultures. Infectious disease recommendations.  Followup culture data.  Follow anemia. Repeat CBC in the morning. Transfuse for hemoglobin less than 7 or evidence of  bleeding.  Resume Xarelto this AM per my discussion with Dr. Marlou Sa.  Wean oxygen   Check 2-D echocardiogram.  Code Status: full code DVT prophylaxis: Xarelto Family Communication: family present Disposition Plan: return to SNF  Brandon Hodgkins, MD  [facility]  Pager [phone] If 7PM-7AM, please contact night-coverage at [email or web address], password [facility] 04/27/2014, 7:55 AM  LOS: 2 days   Consultants:  Orthopedics  Infectious disease  Procedures:  6/9: Bilateral knee open arthrotomy, synovectomy, antibiotic bead placement, and manipulation under anesthesia of the right knee.  Antibiotics:  Ceftriaxone 6/9?   Vancomycin 6/10 >>   HPI/Subjective: Pain well controlled. No chest pain or shortness of breath. Eating well. No nausea or vomiting.  Objective: Filed Vitals:   04/26/14 2157 04/27/14 0000 04/27/14 0012 04/27/14 0400  BP: 135/6  112/77 129/57  Pulse: 130  118 120  Temp: 98.9 F (37.2 C)  98.5 F (36.9 C) 98.8 F (37.1 C)  TempSrc:    Oral Oral  Resp: 18 17 18 16   Height:      Weight:      SpO2: 99%  100% 98%    Intake/Output Summary (Last 24 hours) at 04/27/14 0755 Last data filed at 04/27/14 0739  Gross per 24 hour  Intake 2939.17 ml  Output    400 ml  Net 2539.17 ml     Filed Weights   04/25/14 1352  Weight: 73.936 kg (163 lb)    Exam:    afebrile. Hemodynamically stable. Gen. Appears calm and comfortable. Psych. Grossly normal mood and affect. Speech fluent and appropriate. Cardiovascular. Regular rate and rhythm. No  rub or gallop. 2/6 systolic murmur left upper sternal border. No lower extremity edema. Telemetry sinus tachycardia. Respiratory. Clear to auscultation bilaterally. No wheezes, rales or rhonchi. Normal respiratory effort. Neurologic. Moves all extremities to command.   Data Reviewed: Chemistry: Capillary blood sugars stable. No hypoglycemia. Heme: Hemoglobin somewhat lower today, 7.4. ID: Blood cultures 1/2 gram-positive cocci. Wound cultures left and right knee pending.  Other: EKG on admission normal sinus rhythm no acute changes   Scheduled Meds: . baclofen  10 mg Oral TID  . cefTRIAXone (ROCEPHIN)  IV  1 g Intravenous Once  . feeding supplement (GLUCERNA SHAKE)  237 mL Oral TID BM  . HYDROcodone-acetaminophen  1 tablet Oral TID WC  . HYDROcodone-acetaminophen  2 tablet Oral QHS  . insulin aspart  0-15 Units Subcutaneous TID WC  . insulin aspart  0-5 Units Subcutaneous QHS  . lactobacillus acidophilus  1 tablet Oral BID  . lisinopril  20 mg Oral Daily  . rivaroxaban  10 mg Oral Q breakfast  . sertraline  12.5 mg Oral Daily  . [START ON 05/01/2014] sertraline  25 mg Oral Daily  . simvastatin  20 mg Oral q1800  . sodium chloride  3 mL Intravenous Q12H  . sodium chloride  3 mL Intravenous Q12H  . vancomycin  1,000 mg Intravenous Q12H  . [START ON 04/29/2014] Vitamin D (Ergocalciferol)  50,000 Units Oral Once per day on Mon Fri   Continuous Infusions: . lactated ringers 50  mL/hr at 04/26/14 1052    Active Problems:   Essential hypertension   Diabetes mellitus type II, controlled   Hyperlipidemia   Bacterial infection of knee joint   DVT of lower extremity, bilateral   Septic joint   Time spent 20 minutes

## 2014-04-27 NOTE — Progress Notes (Signed)
  Echocardiogram 2D Echocardiogram has been performed.  Basilia Jumbo 04/27/2014, 3:42 PM

## 2014-04-27 NOTE — Progress Notes (Signed)
Subjective: Pt stable - pain ok - on xarelto   Objective: Vital signs in last 24 hours: Temp:  [98.1 F (36.7 C)-99.5 F (37.5 C)] 98.8 F (37.1 C) (06/10 0400) Pulse Rate:  [94-154] 120 (06/10 0400) Resp:  [15-22] 16 (06/10 0400) BP: (112-137)/(6-77) 129/57 mmHg (06/10 0400) SpO2:  [96 %-100 %] 98 % (06/10 0400)  Intake/Output from previous day: 06/09 0701 - 06/10 0700 In: 1700 [I.V.:1700] Out: 400 [Blood:400] Intake/Output this shift: Total I/O In: 1239.2 [I.V.:1039.2; IV Piggyback:200] Out: -   Exam:  Dorsiflexion/Plantar flexion intact  Labs:  Recent Labs  04/25/14 1800 04/26/14 0505 04/27/14 0431  HGB 8.1* 8.8* 7.4*    Recent Labs  04/26/14 0505 04/27/14 0431  WBC 8.9 12.5*  RBC 3.26* 2.71*  HCT 27.7* 23.1*  PLT 588* 541*    Recent Labs  04/25/14 1800 04/26/14 0505  NA 134* 135*  K 4.3 4.2  CL 96 94*  CO2 27 24  BUN 11 8  CREATININE 0.66 0.68  GLUCOSE 120* 116*  CALCIUM 9.2 9.5   No results found for this basename: LABPT, INR,  in the last 72 hours  Assessment/Plan: Pt stable - hgb low - may need transfusion - ok for wbat bil legs with knee immobilizer on right - on xarelto for dvt prophylaxis - start cpm for    Edeline Greening SCOTT 04/27/2014, 8:16 AM

## 2014-04-27 NOTE — Evaluation (Signed)
Agree with evaluation.  Cyndie Chime, OTR/L Occupational Therapist [phone]

## 2014-04-27 NOTE — Progress Notes (Signed)
INITIAL NUTRITION ASSESSMENT  DOCUMENTATION CODES Per approved criteria  -Severe malnutrition in the context of chronic illness   INTERVENTION: Boost Plus TID  NUTRITION DIAGNOSIS: Malnutrition related to chronic illness as evidenced by severe fat and muscle wasting, 16% weight loss x 1 month, and intake of </= 75% of needs for >/= 1 month.   Goal: Pt to meet >/= 90% of their estimated nutrition needs   Monitor:  PO intake, supplement acceptance, weight trend, labs  Reason for Assessment: Pt identified as at nutrition risk on the Malnutrition Screen Tool  73 y.o. male  Admitting Dx: <principal problem not specified>  ASSESSMENT: Bilateral knee sepsis.  Pt very flat. Hx provided by wife at bedside. Per wife pt has been in SNF for the last 20 days and has had a very poor appetite. Breakfast is oatmeal, grits, and juice, at lunch and dinner pt only consuming bites of food. Pt has recently started receiving Boost and likes chocolate.  Pt has lost 16% of his weight in the last month.   Nutrition Focused Physical Exam:  Subcutaneous Fat:  Orbital Region: WNL Upper Arm Region: moderate depletion  Thoracic and Lumbar Region: moderate depletion  Muscle:  Temple Region: severe depletion Clavicle Bone Region: severe depeletion Clavicle and Acromion Bone Region: severe depletion  Scapular Bone Region: severe depletion  Dorsal Hand: mild depletion  Patellar Region: NA Anterior Thigh Region: NA Posterior Calf Region: NA  Edema: not present   Height: Ht Readings from Last 1 Encounters:  04/25/14 5\' 8"  (1.727 m)    Weight: Wt Readings from Last 1 Encounters:  04/25/14 163 lb (73.936 kg)    Ideal Body Weight: 70 kg   % Ideal Body Weight: 106%  Wt Readings from Last 10 Encounters:  04/25/14 163 lb (73.936 kg)  04/25/14 163 lb (73.936 kg)  03/29/14 200 lb 2.8 oz (90.8 kg)  03/29/14 200 lb 2.8 oz (90.8 kg)  03/20/14 190 lb (86.183 kg)  03/15/14 190 lb (86.183 kg)   03/01/14 197 lb 3.2 oz (89.449 kg)    Usual Body Weight: 195 lb   % Usual Body Weight: 84%  BMI:  Body mass index is 24.79 kg/(m^2).  Estimated Nutritional Needs: Kcal: 2100-2300 Protein: 110-125 grams Fluid: >2.1 L/day  Skin: bilateral knee incision  Diet Order: Carb Control  EDUCATION NEEDS: -No education needs identified at this time   Intake/Output Summary (Last 24 hours) at 04/27/14 1356 Last data filed at 04/27/14 1300  Gross per 24 hour  Intake 2659.17 ml  Output    200 ml  Net 2459.17 ml    Last BM: 6/8   Labs:   Recent Labs Lab 04/25/14 1800 04/26/14 0505  NA 134* 135*  K 4.3 4.2  CL 96 94*  CO2 27 24  BUN 11 8  CREATININE 0.66 0.68  CALCIUM 9.2 9.5  GLUCOSE 120* 116*    CBG (last 3)   Recent Labs  04/26/14 2211 04/27/14 0641 04/27/14 1107  GLUCAP 189* 194* 223*    Scheduled Meds: . baclofen  10 mg Oral TID  . cefTRIAXone (ROCEPHIN)  IV  1 g Intravenous Once  . feeding supplement (GLUCERNA SHAKE)  237 mL Oral TID BM  . HYDROcodone-acetaminophen  1 tablet Oral TID WC  . HYDROcodone-acetaminophen  2 tablet Oral QHS  . insulin aspart  0-15 Units Subcutaneous TID WC  . insulin aspart  0-5 Units Subcutaneous QHS  . lactobacillus acidophilus  1 tablet Oral BID  . lisinopril  20  mg Oral Daily  . rivaroxaban  20 mg Oral Q breakfast  . sertraline  12.5 mg Oral Daily  . [START ON 05/01/2014] sertraline  25 mg Oral Daily  . simvastatin  20 mg Oral q1800  . sodium chloride  3 mL Intravenous Q12H  . sodium chloride  3 mL Intravenous Q12H  . vancomycin  1,000 mg Intravenous Q12H  . [START ON 04/29/2014] Vitamin D (Ergocalciferol)  50,000 Units Oral Once per day on Mon Fri    Continuous Infusions:   Past Medical History  Diagnosis Date  . Attention deficit disorder   . Diabetes mellitus without complication   . Hyperlipidemia   . Hypertension   . Blind left eye     Past Surgical History  Procedure Laterality Date  . Lipoma removal,  right upper back    . Eye surgery Left     x3  . Tonsillectomy    . Knee arthroscopy Bilateral 03/29/2014    Procedure: RIGHT KNEE ARTHROSCOPY AND DEBRIDEMENT, LEFT KNEE ASPIRATION,  LEFT KNEE ARTHROSCOPY AND DEBRIDEMENT.;  Surgeon: Meredith Pel, MD;  Location: [facility];  Service: Orthopedics;  Laterality: Bilateral;  RIGHT KNEE ARTHROSCOPY AND DEBRIDEMENT, LEFT KNEE ASPIRATION,  LEFT KNEE ARTHROSCOPY AND DEBRIDEMENT.    [place], [place], [place] Pager [phone] After Hours Pager

## 2014-04-28 ENCOUNTER — Encounter (HOSPITAL_COMMUNITY): Payer: Self-pay | Admitting: Orthopedic Surgery

## 2014-04-28 DIAGNOSIS — D62 Acute posthemorrhagic anemia: Secondary | ICD-10-CM

## 2014-04-28 DIAGNOSIS — R7881 Bacteremia: Secondary | ICD-10-CM

## 2014-04-28 DIAGNOSIS — E43 Unspecified severe protein-calorie malnutrition: Secondary | ICD-10-CM | POA: Diagnosis not present

## 2014-04-28 DIAGNOSIS — M009 Pyogenic arthritis, unspecified: Principal | ICD-10-CM

## 2014-04-28 DIAGNOSIS — I82409 Acute embolism and thrombosis of unspecified deep veins of unspecified lower extremity: Secondary | ICD-10-CM | POA: Diagnosis not present

## 2014-04-28 DIAGNOSIS — E119 Type 2 diabetes mellitus without complications: Secondary | ICD-10-CM | POA: Diagnosis not present

## 2014-04-28 LAB — CULTURE, BLOOD (ROUTINE X 2)

## 2014-04-28 LAB — ABO/RH: ABO/RH(D): AB POS

## 2014-04-28 LAB — PREPARE RBC (CROSSMATCH)

## 2014-04-28 LAB — GLUCOSE, CAPILLARY
GLUCOSE-CAPILLARY: 153 mg/dL — AB (ref 70–99)
GLUCOSE-CAPILLARY: 106 mg/dL — AB (ref 70–99)
Glucose-Capillary: 128 mg/dL — ABNORMAL HIGH (ref 70–99)
Glucose-Capillary: 218 mg/dL — ABNORMAL HIGH (ref 70–99)
Glucose-Capillary: 167 mg/dL — ABNORMAL HIGH (ref 70–99)

## 2014-04-28 LAB — CBC
HCT: 19.8 % — ABNORMAL LOW (ref 39.0–52.0)
Hemoglobin: 6.2 g/dL — CL (ref 13.0–17.0)
MCH: 26.5 pg (ref 26.0–34.0)
MCHC: 31.3 g/dL (ref 30.0–36.0)
MCV: 84.6 fL (ref 78.0–100.0)
Platelets: 543 10*3/uL — ABNORMAL HIGH (ref 150–400)
RBC: 2.34 MIL/uL — ABNORMAL LOW (ref 4.22–5.81)
RDW: 14.8 % (ref 11.5–15.5)
WBC: 11.1 10*3/uL — AB (ref 4.0–10.5)

## 2014-04-28 LAB — HEMOGLOBIN AND HEMATOCRIT, BLOOD
HCT: 23.5 % — ABNORMAL LOW (ref 39.0–52.0)
Hemoglobin: 7.8 g/dL — ABNORMAL LOW (ref 13.0–17.0)

## 2014-04-28 MED ORDER — HYDROMORPHONE HCL PF 1 MG/ML IJ SOLN
0.5000 mg | INTRAMUSCULAR | Status: DC | PRN
  Administered 2014-04-29: 0.5 mg via INTRAVENOUS
  Filled 2014-04-28: qty 1

## 2014-04-28 MED ORDER — KETOROLAC TROMETHAMINE 30 MG/ML IJ SOLN
30.0000 mg | Freq: Once | INTRAMUSCULAR | Status: AC
  Administered 2014-04-28: 30 mg via INTRAVENOUS
  Filled 2014-04-28: qty 1

## 2014-04-28 MED ORDER — HYDROCODONE-ACETAMINOPHEN 10-325 MG PO TABS
1.0000 | ORAL_TABLET | ORAL | Status: DC | PRN
  Administered 2014-04-28: 1 via ORAL
  Filled 2014-04-28: qty 1

## 2014-04-28 NOTE — Progress Notes (Signed)
[facility] for Infectious Disease    Date of Admission:  04/25/2014   Total days of antibiotics 3        Day 3 vancomycin        Day 2 ceftriaxone           ID: [name] is a 73 y.o. male with bilateral septic arthritis s/p I x D with antibiotic beads on vanco and ceftriaxone Principal Problem:   Bacterial infection of knee joint Active Problems:   Essential hypertension   Diabetes mellitus type II, controlled   Hyperlipidemia   DVT of lower extremity, bilateral   Septic joint   Protein-calorie malnutrition, severe   Acute blood loss anemia    Subjective: Afebrile, still having significant pain in knees. Did not eat dinner. Wife concern about nutritional intake  Medications:  . baclofen  10 mg Oral TID  . cefTRIAXone (ROCEPHIN)  IV  2 g Intravenous Q24H  . HYDROcodone-acetaminophen  1 tablet Oral TID WC  . HYDROcodone-acetaminophen  2 tablet Oral QHS  . insulin aspart  0-15 Units Subcutaneous TID WC  . insulin aspart  0-5 Units Subcutaneous QHS  . lactobacillus acidophilus  1 tablet Oral BID  . lactose free nutrition  237 mL Oral TID WC  . lisinopril  20 mg Oral Daily  . rivaroxaban  20 mg Oral Q breakfast  . sertraline  12.5 mg Oral Daily  . [START ON 05/01/2014] sertraline  25 mg Oral Daily  . simvastatin  20 mg Oral q1800  . sodium chloride  3 mL Intravenous Q12H  . sodium chloride  3 mL Intravenous Q12H  . vancomycin  1,000 mg Intravenous Q12H  . [START ON 04/29/2014] Vitamin D (Ergocalciferol)  50,000 Units Oral Once per day on Mon Fri    Objective: Vital signs in last 24 hours: Temp:  [98 F (36.7 C)-98.5 F (36.9 C)] 98 F (36.7 C) (06/11 1130) Pulse Rate:  [105-116] 115 (06/11 1130) Resp:  [16-18] 18 (06/11 1200) BP: (118-146)/(50-68) 118/50 mmHg (06/11 1130) SpO2:  [100 %] 100 % (06/11 1200) Physical Exam  Constitutional: He is oriented to person, place, and time. He appears well-developed and well-nourished. No distress.  HENT:    Mouth/Throat: Oropharynx is clear and moist. No oropharyngeal exudate.  Ext: legs are wrapped bilaterally. Right leg immobilized Skin: Skin is warm and dry. No rash noted. No erythema.     Lab Results  Recent Labs  04/25/14 1800 04/26/14 0505 04/27/14 0431 04/28/14 0445  WBC 8.7 8.9 12.5* 11.1*  HGB 8.1* 8.8* 7.4* 6.2*  HCT 25.4* 27.7* 23.1* 19.8*  NA 134* 135*  --   --   K 4.3 4.2  --   --   CL 96 94*  --   --   CO2 27 24  --   --   BUN 11 8  --   --   CREATININE 0.66 0.68  --   --    Liver Panel  Recent Labs  04/25/14 1800  PROT 7.3  ALBUMIN 1.9*  AST 47*  ALT 35  ALKPHOS 97  BILITOT 0.2*   Sedimentation Rate No results found for this basename: ESRSEDRATE,  in the last 72 hours C-Reactive Protein No results found for this basename: CRP,  in the last 72 hours  Microbiology: 6/8 blood cx 1 of 2 CoNS 6/9 wound cx ngtd Studies/Results: No results found.   Assessment/Plan: Bacteremia = likely contaminant, will repeat blood cx  Septic arthritis  of knees = continue with vancomycin and ceftriaxone until we have return on cx data  Knee pain = will give one time dose of toradol to see if that improves his symptoms without increased sedation.   Baxter Flattery [facility] for Infectious Diseases Cell: [phone] Pager: [phone]  04/28/2014, 2:07 PM

## 2014-04-28 NOTE — Progress Notes (Signed)
Subjective: Pt having pain - on dilaudid   Objective: Vital signs in last 24 hours: Temp:  [98.3 F (36.8 C)-98.7 F (37.1 C)] 98.3 F (36.8 C) (06/11 0600) Pulse Rate:  [105-116] 105 (06/11 0600) Resp:  [16-18] 18 (06/11 0600) BP: (124-146)/(55-68) 133/63 mmHg (06/11 0600) SpO2:  [100 %] 100 % (06/11 0600)  Intake/Output from previous day: 06/10 0701 - 06/11 0700 In: 2999.2 [P.O.:1060; I.V.:1289.2; IV Piggyback:650] Out: 650 [Urine:650] Intake/Output this shift:    Exam:  Dorsiflexion/Plantar flexion intact  Labs:  Recent Labs  04/25/14 1800 04/26/14 0505 04/27/14 0431 04/28/14 0445  HGB 8.1* 8.8* 7.4* 6.2*    Recent Labs  04/27/14 0431 04/28/14 0445  WBC 12.5* 11.1*  RBC 2.71* 2.34*  HCT 23.1* 19.8*  PLT 541* 543*    Recent Labs  04/25/14 1800 04/26/14 0505  NA 134* 135*  K 4.3 4.2  CL 96 94*  CO2 27 24  BUN 11 8  CREATININE 0.66 0.68  GLUCOSE 120* 116*  CALCIUM 9.2 9.5   No results found for this basename: LABPT, INR,  in the last 72 hours  Assessment/Plan: Plan for removal left kneewrap  with initiation of cpm - begin rom right knee Friday - transfusion today    Marlise Fahr SCOTT 04/28/2014, 7:50 AM

## 2014-04-28 NOTE — Progress Notes (Signed)
Orthopedic Tech Progress Note Patient Details:  [name] July 20, 1941 [id number] CPM delivered and set for patient. Patient currently sitting in chair; CPM not applied at this time but left in room for application once patient is back in bed. CPM Left Knee CPM Left Knee: Off CPM Right Knee CPM Right Knee: Off   Asia Mellody Memos 04/28/2014, 10:36 AM

## 2014-04-28 NOTE — Discharge Instructions (Signed)
Information on my medicine - XARELTO (rivaroxaban)  This medication education was reviewed with me or my healthcare representative as part of my discharge preparation.  The pharmacist that spoke with me during my hospital stay was:  Saundra Shelling, [place]? Xarelto was prescribed to treat blood clots that may have been found in the veins of your legs (deep vein thrombosis) or in your lungs (pulmonary embolism) and to reduce the risk of them occurring again.  What do you need to know about Xarelto? The starting dose is one 15 mg tablet taken TWICE daily with food for the FIRST 21 DAYS then changed to one 20 mg tablet taken ONCE A DAY with your evening meal.  Currently on 20mg  PO daily with supper.  DO NOT stop taking Xarelto without talking to the health care provider who prescribed the medication.  Refill your prescription for 20 mg tablets before you run out.  After discharge, you should have regular check-up appointments with your healthcare provider that is prescribing your Xarelto.  In the future your dose may need to be changed if your kidney function changes by a significant amount.  What do you do if you miss a dose? If you are taking Xarelto TWICE DAILY and you miss a dose, take it as soon as you remember. You may take two 15 mg tablets (total 30 mg) at the same time then resume your regularly scheduled 15 mg twice daily the next day.  If you are taking Xarelto ONCE DAILY and you miss a dose, take it as soon as you remember on the same day then continue your regularly scheduled once daily regimen the next day. Do not take two doses of Xarelto at the same time.   Important Safety Information Xarelto is a blood thinner medicine that can cause bleeding. You should call your healthcare provider right away if you experience any of the following:   Bleeding from an injury or your nose that does not stop.   Unusual colored urine (red or dark brown) or  unusual colored stools (red or black).   Unusual bruising for unknown reasons.   A serious fall or if you hit your head (even if there is no bleeding).  Some medicines may interact with Xarelto and might increase your risk of bleeding while on Xarelto. To help avoid this, consult your healthcare provider or pharmacist prior to using any new prescription or non-prescription medications, including herbals, vitamins, non-steroidal anti-inflammatory drugs (NSAIDs) and supplements.  This website has more information on Xarelto: [email or web address]

## 2014-04-28 NOTE — Progress Notes (Signed)
PROGRESS NOTE  [name] WUJ:[id number] DOB: 1941-09-10 DOA: 04/25/2014 PCP: Tawanna Solo, MD  Summary: 73 year old man with history of septic arthritis bilateral knees, underwent orthopedic surgery 5/12, followed by infectious disease and discharged from hospital on IV ceftriaxone 5/18, who was directly admitted 6/8 for operative intervention after failing outpatient IV antibiotics.  Assessment/Plan: 1. Septic arthritis bilateral knees: Persistent. Status post surgery 6/9. Appreciate infectious disease evaluation. 2. Acute blood loss anemia, suspect multifactorial anemia of acute illness and perioperative blood loss. Plan transfusion. 3. 1/2 bottles coag negative staph 4. BLE DVT diagnosed 5/15 on previous hospitalization.  5. DM type 2. Stable, continue SSI. 6. HTN 7. Severe malnutrition in context of chronic illness   Continue management per orthopedics, empiric antibiotics, followup cultures. Infectious disease following.  Follow anemia. Repeat CBC in the morning. Transfuse 2 units packed red blood cells. If recurrent anemia or develops bleeding, may need to consider heparin infusion, GI evaluation but hold off for now.  Continue Xarelto, but watch Hgb.  Code Status: full code DVT prophylaxis: Xarelto Family Communication: family present Disposition Plan: return to SNF  [name], MD  [facility]  Pager [phone] If 7PM-7AM, please contact night-coverage at [email or web address], password [facility] 04/28/2014, 10:19 AM  LOS: 3 days   Consultants:  Orthopedics  Infectious disease  Physical therapy skilled nursing facility  Occupational therapy skilled nursing facility  Procedures:  6/9: Bilateral knee open arthrotomy, synovectomy, antibiotic bead placement, and manipulation under anesthesia of the right knee.  2-D echocardiogram left ventricular ejection fraction 60-65%. Normal wall motion, no regional wall motion abnormalities. Overall very poor image  quality.  Transfusion one unit packed red blood cells 6/11  Antibiotics:  Ceftriaxone 6/10 >>    Vancomycin 6/10 >>   HPI/Subjective: No chest pain or shortness of breath. Reports bilateral knee pain.  Objective: Filed Vitals:   04/27/14 2009 04/28/14 0000 04/28/14 0327 04/28/14 0600  BP: 146/68   133/63  Pulse: 116   105  Temp: 98.5 F (36.9 C)   98.3 F (36.8 C)  TempSrc:      Resp: 16 17 17 18   Height:      Weight:      SpO2: 100%   100%    Intake/Output Summary (Last 24 hours) at 04/28/14 1019 Last data filed at 04/28/14 0900  Gross per 24 hour  Intake   1640 ml  Output    650 ml  Net    990 ml     Filed Weights   04/25/14 1352  Weight: 73.936 kg (163 lb)    Exam:  Afebrile, vital signs are stable. No hypoxia.  Gen. Appears calm, comfortable. Psych. Somewhat confused but follows commands. Answers simple questions. Cardiovascular. Regular rate and rhythm. No murmur, rub or gallop. Respiratory. Clear to auscultation bilaterally. No wheezes, rales or rhonchi. Normal respiratory effort. Musculoskeletal. Both knees stress. Moves both legs to command. Neurologic. Grossly nonfocal.   Data Reviewed: Chemistry: Capillary blood sugars stable. Heme: Hemoglobin 6.2. Iron less than 10. ID: Blood cultures 1/2 coag negative staph. Wound cultures left and right knee pending.    Scheduled Meds: . baclofen  10 mg Oral TID  . cefTRIAXone (ROCEPHIN)  IV  2 g Intravenous Q24H  . HYDROcodone-acetaminophen  1 tablet Oral TID WC  . HYDROcodone-acetaminophen  2 tablet Oral QHS  . insulin aspart  0-15 Units Subcutaneous TID WC  . insulin aspart  0-5 Units Subcutaneous QHS  . lactobacillus acidophilus  1 tablet Oral BID  .  lactose free nutrition  237 mL Oral TID WC  . lisinopril  20 mg Oral Daily  . rivaroxaban  20 mg Oral Q breakfast  . sertraline  12.5 mg Oral Daily  . [START ON 05/01/2014] sertraline  25 mg Oral Daily  . simvastatin  20 mg Oral q1800  . sodium  chloride  3 mL Intravenous Q12H  . sodium chloride  3 mL Intravenous Q12H  . vancomycin  1,000 mg Intravenous Q12H  . [START ON 04/29/2014] Vitamin D (Ergocalciferol)  50,000 Units Oral Once per day on Mon Fri   Continuous Infusions:    Principal Problem:   Bacterial infection of knee joint Active Problems:   Essential hypertension   Diabetes mellitus type II, controlled   Hyperlipidemia   DVT of lower extremity, bilateral   Septic joint   Protein-calorie malnutrition, severe   Acute blood loss anemia   Time spent 20 minutes

## 2014-04-28 NOTE — Progress Notes (Signed)
CRITICAL VALUE ALERT  Critical value received:  Hemoglobin 6.2 g/dL   Date of notification:  04/28/2014   Time of notification:  0554  Critical value read back:yes  Nurse who received alert:  Ulric Salzman [place] Marks Scalera, RN  MD notified (1st page):  Lamar Blinks, NP  Time of first page:  [phone]  MD notified (2nd page):  Time of second page:  Responding MD:  Lamar Blinks, NP  Time MD responded:  0600

## 2014-04-28 NOTE — Progress Notes (Signed)
Physical Therapy Treatment Patient Details Name: [name] MRN: [id number] DOB: 1941/01/29 Today's Date: 04/28/2014    History of Present Illness 73 year old man with history of septic arthritis bilateral knees, underwent orthopedic surgery 5/12, followed by infectious disease and discharged from hospital on IV ceftriaxone 5/18, who was directly admitted 6/8 for operative intervention after failing outpatient IV antibiotics.    PT Comments    Pt continues to be confused and with minimal participation in mobility.  Pt very painful, however premedicated.  Pt requires total A x2 for all mobility.  Pt will need SNF level of care at D/C.  Will continue to follow.    Follow Up Recommendations  SNF     Equipment Recommendations  None recommended by PT    Recommendations for Other Services OT consult     Precautions / Restrictions Precautions Precautions: Fall Required Braces or Orthoses: Knee Immobilizer - Right Knee Immobilizer - Right: On at all times Restrictions Weight Bearing Restrictions: Yes RLE Weight Bearing: Weight bearing as tolerated LLE Weight Bearing: Weight bearing as tolerated    Mobility  Bed Mobility Overal bed mobility: Needs Assistance;+2 for physical assistance Bed Mobility: Supine to Sit     Supine to sit: Total assist;+2 for physical assistance;HOB elevated     General bed mobility comments: pt without participation in bed mobility.  pt kept eyes closed praying and talking about "not telling Vira Agar".    Transfers Overall transfer level: Needs assistance Equipment used: 2 person hand held assist Transfers: Sit to/from [facility] Sit to Stand: Total assist;+2 physical assistance Stand pivot transfers: Total assist;+2 physical assistance       General transfer comment: step by step cueing for participation and safe technique.  pt with minimal participation in transfer.    Ambulation/Gait                 Stairs             Wheelchair Mobility    Modified Rankin (Stroke Patients Only)       Balance Overall balance assessment: Needs assistance Sitting-balance support: Bilateral upper extremity supported;Feet supported Sitting balance-Leahy Scale: Poor Sitting balance - Comments: pt with posterior lean and needs cueing and A to correct balance.                              Cognition Arousal/Alertness: Lethargic Behavior During Therapy: Anxious;Restless Overall Cognitive Status: Impaired/Different from baseline Area of Impairment: Orientation;Attention;Memory;Following commands;Safety/judgement;Awareness;Problem solving Orientation Level: Disoriented to;Place;Time;Situation Current Attention Level: Focused Memory: Decreased short-term memory;Decreased recall of precautions Following Commands: Follows one step commands inconsistently Safety/Judgement: Decreased awareness of safety;Decreased awareness of deficits Awareness: Intellectual Problem Solving: Slow processing;Decreased initiation;Difficulty sequencing;Requires verbal cues;Requires tactile cues General Comments: pt at one point thinks he is at home on his sofa.  Difficult to get pt to open his eyes and difficult to orient.  pt's wife states pain meds have been affecting cognition.      Exercises      General Comments        Pertinent Vitals/Pain Pt yells out, but doesn't rate.  Premedicated.      Home Living                      Prior Function            PT Goals (current goals can now be found in the care plan section) Acute Rehab PT Goals  Patient Stated Goal: none stated Time For Goal Achievement: 05/11/14 Potential to Achieve Goals: Fair Progress towards PT goals: Progressing toward goals    Frequency  Min 3X/week    PT Plan Current plan remains appropriate    Co-evaluation             End of Session Equipment Utilized During Treatment: Gait belt;Oxygen Activity Tolerance: Patient  limited by pain Patient left: in chair;with call bell/phone within reach;with chair alarm set     Time: 1610-9604 PT Time Calculation (min): 31 min  Charges:  $Therapeutic Activity: 23-37 mins                    G CodesCatarina Hartshorn, [place] 04/28/2014, 12:13 PM

## 2014-04-29 ENCOUNTER — Inpatient Hospital Stay (HOSPITAL_COMMUNITY): Payer: Medicare Other

## 2014-04-29 ENCOUNTER — Encounter (HOSPITAL_COMMUNITY): Payer: Self-pay

## 2014-04-29 DIAGNOSIS — R7881 Bacteremia: Secondary | ICD-10-CM | POA: Diagnosis not present

## 2014-04-29 DIAGNOSIS — J984 Other disorders of lung: Secondary | ICD-10-CM | POA: Diagnosis not present

## 2014-04-29 DIAGNOSIS — E119 Type 2 diabetes mellitus without complications: Secondary | ICD-10-CM | POA: Diagnosis not present

## 2014-04-29 DIAGNOSIS — I82409 Acute embolism and thrombosis of unspecified deep veins of unspecified lower extremity: Secondary | ICD-10-CM | POA: Diagnosis not present

## 2014-04-29 DIAGNOSIS — M009 Pyogenic arthritis, unspecified: Secondary | ICD-10-CM | POA: Diagnosis not present

## 2014-04-29 DIAGNOSIS — D62 Acute posthemorrhagic anemia: Secondary | ICD-10-CM | POA: Diagnosis not present

## 2014-04-29 DIAGNOSIS — N281 Cyst of kidney, acquired: Secondary | ICD-10-CM | POA: Diagnosis not present

## 2014-04-29 LAB — BODY FLUID CULTURE
CULTURE: NO GROWTH
CULTURE: NO GROWTH

## 2014-04-29 LAB — GLUCOSE, CAPILLARY
GLUCOSE-CAPILLARY: 137 mg/dL — AB (ref 70–99)
Glucose-Capillary: 174 mg/dL — ABNORMAL HIGH (ref 70–99)
Glucose-Capillary: 108 mg/dL — ABNORMAL HIGH (ref 70–99)
Glucose-Capillary: 141 mg/dL — ABNORMAL HIGH (ref 70–99)

## 2014-04-29 LAB — BASIC METABOLIC PANEL
BUN: 12 mg/dL (ref 6–23)
CHLORIDE: 96 meq/L (ref 96–112)
CO2: 30 mEq/L (ref 19–32)
Calcium: 10.9 mg/dL — ABNORMAL HIGH (ref 8.4–10.5)
Creatinine, Ser: 0.69 mg/dL (ref 0.50–1.35)
GFR calc Af Amer: >90 mL/min (ref >90)
GLUCOSE: 127 mg/dL — AB (ref 70–99)
POTASSIUM: 4.1 meq/L (ref 3.7–5.3)
SODIUM: 137 meq/L (ref 137–147)

## 2014-04-29 LAB — CBC
HCT: 20.5 % — ABNORMAL LOW (ref 39.0–52.0)
HEMOGLOBIN: 6.6 g/dL — AB (ref 13.0–17.0)
MCH: 27 pg (ref 26.0–34.0)
MCHC: 32.2 g/dL (ref 30.0–36.0)
MCV: 84 fL (ref 78.0–100.0)
Platelets: 573 10*3/uL — ABNORMAL HIGH (ref 150–400)
RBC: 2.44 MIL/uL — ABNORMAL LOW (ref 4.22–5.81)
RDW: 15 % (ref 11.5–15.5)
WBC: 10.9 10*3/uL — ABNORMAL HIGH (ref 4.0–10.5)

## 2014-04-29 LAB — URINALYSIS, ROUTINE W REFLEX MICROSCOPIC
Bilirubin Urine: NEGATIVE
Glucose, UA: NEGATIVE mg/dL
Hgb urine dipstick: NEGATIVE
Ketones, ur: NEGATIVE mg/dL
LEUKOCYTES UA: NEGATIVE
NITRITE: NEGATIVE
Protein, ur: NEGATIVE mg/dL
Specific Gravity, Urine: 1.017 (ref 1.005–1.030)
UROBILINOGEN UA: 0.2 mg/dL (ref 0.0–1.0)
pH: 7.5 (ref 5.0–8.0)

## 2014-04-29 LAB — C-REACTIVE PROTEIN: CRP: 16.2 mg/dL — ABNORMAL HIGH (ref <0.60)

## 2014-04-29 LAB — PREPARE RBC (CROSSMATCH)

## 2014-04-29 LAB — SEDIMENTATION RATE: Sed Rate: 140 mm/hr — ABNORMAL HIGH (ref 0–16)

## 2014-04-29 MED ORDER — LORAZEPAM 2 MG/ML IJ SOLN
INTRAMUSCULAR | Status: AC
  Filled 2014-04-29: qty 1

## 2014-04-29 MED ORDER — IOHEXOL 350 MG/ML SOLN
100.0000 mL | Freq: Once | INTRAVENOUS | Status: AC | PRN
  Administered 2014-04-29: 100 mL via INTRAVENOUS

## 2014-04-29 MED ORDER — LORAZEPAM 2 MG/ML IJ SOLN
0.5000 mg | Freq: Once | INTRAMUSCULAR | Status: AC
  Administered 2014-04-29: 0.5 mg via INTRAVENOUS

## 2014-04-29 MED ORDER — HYDROCODONE-ACETAMINOPHEN 10-325 MG PO TABS: 1.0000 | ORAL_TABLET | ORAL | Status: DC | PRN

## 2014-04-29 NOTE — Progress Notes (Signed)
Subjective: Pt stable but confused after am dilaudid   Objective: Vital signs in last 24 hours: Temp:  [97.7 F (36.5 C)-99 F (37.2 C)] 99 F (37.2 C) (06/12 0616) Pulse Rate:  [102-120] 102 (06/12 0616) Resp:  [16-18] 18 (06/12 0616) BP: (116-125)/(50-68) 125/68 mmHg (06/12 0616) SpO2:  [98 %-100 %] 99 % (06/12 0616)  Intake/Output from previous day: 06/11 0701 - 06/12 0700 In: 602 [P.O.:240; Blood:362] Out: 800 [Urine:800] Intake/Output this shift:    Exam:  dressing changed this am - knees look good - no excessive swelling to explain drop in hgb  Labs:  Recent Labs  04/27/14 0431 04/28/14 0445 04/28/14 1543 04/29/14 0405  HGB 7.4* 6.2* 7.8* 6.6*    Recent Labs  04/28/14 0445 04/28/14 1543 04/29/14 0405  WBC 11.1*  --  10.9*  RBC 2.34*  --  2.44*  HCT 19.8* 23.5* 20.5*  PLT 543*  --  573*    Recent Labs  04/29/14 0405  NA 137  K 4.1  CL 96  CO2 30  BUN 12  CREATININE 0.69  GLUCOSE 127*  CALCIUM 10.9*   No results found for this basename: LABPT, INR,  in the last 72 hours  Assessment/Plan: CPM bilateral knees - 1hour each 3 times a day - 0 - 30 degrees increase daily - zero boot for both legs when not in cpm plus knee immobilizer for right leg  Ok to go off floor for fresh air after transfusion  Needs 2 units prbc - covering dr paged but at [facility] and not able to respond but will initiate transfusion  Unclear where drop in hgb is coming from    [place] 04/29/2014, 8:14 AM

## 2014-04-29 NOTE — Progress Notes (Signed)
Orthopedic Tech Progress Note Patient Details:  [name] Nov 03, 1941 [id number]  Patient ID: [name], male   DOB: November 15, 1941, 73 y.o.   MRN: [id number] Placed pt's rle in cpm @ 0-40 degrees @1440   Hildred Priest 04/29/2014, 2:40 PM

## 2014-04-29 NOTE — Progress Notes (Signed)
CRITICAL VALUE ALERT  Critical value received:  Hgb 6.6  Date of notification:  04/29/14  Time of notification:  0515   Critical value read back:yes  Nurse who received alert:  Teola Bradley   MD notified (1st page):  Rogue Bussing  Time of first page:  0535  MD notified (2nd page):  Time of second page:  Responding MD:  Rogue Bussing is at [facility] and is unable to come talk to family and patient concerning critical value. Asked that I pass it along to the rounding MD and day shift nurse.  Time MD responded:  [phone]

## 2014-04-29 NOTE — Progress Notes (Signed)
Patient refused to take medicine. Patient would not swallow pills even mixed with applesauce. Very difficult to give the patient any type of pills. Patient's daughter was finally able to get them down. Patient seemed more confused early evening. Patient could not tell his name, birth date or wife's name. Patient was aware that he was in hospital. Will continue to monitor.

## 2014-04-29 NOTE — Care Management Note (Signed)
CARE MANAGEMENT NOTE 04/29/2014  Patient:  [name], [name]   Account Number:  [id number]  Date Initiated:  04/29/2014  Documentation initiated by:  Ricki Miller  Subjective/Objective Assessment:   72 yr old male s/p bilateral knees nopen arthrtomy, synovectomy with antibiotic bead placement.     Action/Plan:   patient will return to [facility] at discharge. Social worker is aware.Pateint Being transfused 2 units PRBC for HGB 6.6.   Anticipated DC Date:  04/30/2014   Anticipated DC Plan:  SKILLED NURSING FACILITY  In-house referral  Clinical Social Worker      DC Planning Services  CM consult      Choice offered to / List presented to:             Status of service:  Completed, signed off Medicare Important Message given?  YES (If response is "NO", the following Medicare IM given date fields will be blank) Date Medicare IM given:  04/29/2014 Date Additional Medicare IM given:    Discharge Disposition:  May  Per UR Regulation:  Reviewed for med. necessity/level of care/duration of stay

## 2014-04-29 NOTE — Progress Notes (Signed)
6/12 - Critical value - Hgb 6.6. Notified MD.

## 2014-04-29 NOTE — Progress Notes (Signed)
[facility] for Infectious Disease    Date of Admission:  04/25/2014   Total days of antibiotics 4        Day 4 vancomycin        Day 3 ceftriaxone           ID: [name] is a 73 y.o. male with bilateral septic arthritis s/p I x D with antibiotic beads on vanco and ceftriaxone Principal Problem:   Bacterial infection of knee joint Active Problems:   Essential hypertension   Diabetes mellitus type II, controlled   Hyperlipidemia   DVT of lower extremity, bilateral   Septic joint   Protein-calorie malnutrition, severe   Acute blood loss anemia    Subjective: Afebrile, still having significant pain in knees. Ate poorly yesterday. Receiving blood tsf   Medications:  . baclofen  10 mg Oral TID  . cefTRIAXone (ROCEPHIN)  IV  2 g Intravenous Q24H  . insulin aspart  0-15 Units Subcutaneous TID WC  . insulin aspart  0-5 Units Subcutaneous QHS  . lactobacillus acidophilus  1 tablet Oral BID  . lactose free nutrition  237 mL Oral TID WC  . lisinopril  20 mg Oral Daily  . rivaroxaban  20 mg Oral Q breakfast  . sertraline  12.5 mg Oral Daily  . [START ON 05/01/2014] sertraline  25 mg Oral Daily  . simvastatin  20 mg Oral q1800  . sodium chloride  3 mL Intravenous Q12H  . sodium chloride  3 mL Intravenous Q12H  . vancomycin  1,000 mg Intravenous Q12H  . Vitamin D (Ergocalciferol)  50,000 Units Oral Once per day on Mon Fri    Objective: Vital signs in last 24 hours: Temp:  [97.7 F (36.5 C)-99 F (37.2 C)] 98.8 F (37.1 C) (06/12 1049) Pulse Rate:  [102-125] 115 (06/12 1049) Resp:  [16-20] 20 (06/12 1049) BP: (116-148)/(56-78) 134/75 mmHg (06/12 1049) SpO2:  [98 %-100 %] 100 % (06/12 1049) Physical Exam  Constitutional: He is oriented to person, place, and time. He appears well-developed and well-nourished. No distress.  HENT:  Mouth/Throat: Oropharynx is clear and moist. No oropharyngeal exudate.  Ext: legs are wrapped bilaterally. Right leg immobilized Skin:  Skin is warm and dry. No rash noted. No erythema.     Lab Results  Recent Labs  04/28/14 0445 04/28/14 1543 04/29/14 0405  WBC 11.1*  --  10.9*  HGB 6.2* 7.8* 6.6*  HCT 19.8* 23.5* 20.5*  NA  --   --  137  K  --   --  4.1  CL  --   --  96  CO2  --   --  30  BUN  --   --  12  CREATININE  --   --  0.69    Microbiology: 6/8 blood cx 1 of 2 CoNS 6/9 wound cx ngtd Studies/Results: No results found.   Assessment/Plan: Bacteremia = likely contaminant, repeat blood cx NGTD  Septic arthritis of knees = recommend to treat with ceftriaxone 2gm IV daily for 6 wk using 04/27/14 as day of 1 of 42 End date will be July 22nd. Will need weekly cbc and bmp as part of monitoring. Will check sed rate and crp.  Knee pain = management per primary team  Will have him follow up in ID clinic in mid July to evaluate improvement. Will sign off.  Baxter Flattery [facility] Cell: [phone] Pager: [phone]  04/29/2014, 11:34 AM

## 2014-04-29 NOTE — Progress Notes (Addendum)
PROGRESS NOTE  [name] MVH:[id number] DOB: [birth year] DOA: 04/25/2014 PCP: Tawanna Solo, MD  Addendum CT chest no PE or acute process. CT ab/pelvis no evidence of bleeding. Plan finish transfusion, recheck CBC in  AM. Suspect anemia of acute illness.   Summary: 73 year old man with history of septic arthritis bilateral knees, underwent orthopedic surgery 5/12, followed by infectious disease and discharged from hospital on IV ceftriaxone 5/18, who was directly admitted 6/8 for operative intervention after failing outpatient IV antibiotics.  Assessment/Plan: 1. Septic arthritis bilateral knees: Persistent. Status post surgery 6/9. Appreciate infectious disease evaluation. 2. Acute blood loss anemia, suspect multifactorial anemia of acute illness and perioperative blood loss. Plan as below 3. Acute encephalopathy. Likely related to pain medications and acute illness. No focal deficits. Follow clinically. Check urinalysis. 4. 1/2 bottles coag negative staph. Repeat blood cultures pending. 5. BLE DVT diagnosed 5/15 on previous hospitalization. Report not in Epic, however hard copy available as is preliminary report in the progress notes.  6. DM type 2. Stable, continue SSI. 7. HTN 8. Severe malnutrition in context of chronic illness   Continue postoperative management per orthopedics, empiric antibiotics, followup cultures. Infectious disease following.  Further evaluation of suspected iron deficiency anemia of unclear etiology. Hemoglobin was normal 5/3, lower 10.4 on 5/16 postoperatively. On readmission, 8.1 with trend downwards postoperatively. Receiving 2 units today. No signs or symptoms to suggest internal bleeding. No history of GI bleeding, normal hemoglobin and MCV 04/06/14 would argue against subacute/chronic blood loss. May be anemia of acute illness with superimposed acute blood loss anemia, possibly complicated by anticoagulation. Plan CT abdomen and pelvis to rule out occult  bleeding. No hypoxia, chest pain or tachypnea but is tachycardic. Plan CT chest to exclude pulmonary embolism. May need to consider stopping anticoagulation or heparin infusion depending on results. Could consider GI consultation, hold until results of CT.  Discussed in detail with wife at bedside.  Code Status: full code DVT prophylaxis: Xarelto Family Communication: family present Disposition Plan: return to SNF  [name], MD  [facility]  Pager [phone] If 7PM-7AM, please contact night-coverage at [email or web address], password [facility] 04/29/2014, 8:26 AM  LOS: 4 days   Consultants:  Orthopedics  Infectious disease  Physical therapy skilled nursing facility  Occupational therapy skilled nursing facility  Procedures:  6/9: Bilateral knee open arthrotomy, synovectomy, antibiotic bead placement, and manipulation under anesthesia of the right knee.  2-D echocardiogram left ventricular ejection fraction 60-65%. Normal wall motion, no regional wall motion abnormalities. Overall very poor image quality.  Transfusion one unit packed red blood cells 6/11  Transfusion 2 units packed red blood cells 6/12  Antibiotics:  Ceftriaxone 6/10 >>    Vancomycin 6/10 >>   HPI/Subjective: Noted to be confused overnight. Hemoglobin has dropped again.  Wife confirms patient somewhat confused. Patient reports pain in his knees. No evidence of bleeding.  Objective: Filed Vitals:   04/28/14 2110 04/29/14 0000 04/29/14 0400 04/29/14 0616  BP: 122/64   125/68  Pulse: 106   102  Temp: 97.7 F (36.5 C)   99 F (37.2 C)  TempSrc:      Resp: 18 16 16 18   Height:      Weight:      SpO2: 98%   99%    Intake/Output Summary (Last 24 hours) at 04/29/14 0826 Last data filed at 04/28/14 2100  Gross per 24 hour  Intake    602 ml  Output    800 ml  Net   -198  ml     Filed Weights   04/25/14 1352  Weight: 73.936 kg (163 lb)    Exam:  Afebrile, vital signs are stable. No  hypoxia.  Gen. Nontoxic. Appears comfortable. Psych. Somewhat confused, oriented to self, wife. Follows commands, moves all extremities. Does become somewhat belligerent and resistant blood pressure check. Speech fluent, clear. Eyes. The left eye is blind. Cardiovascular. Tachycardic, regular rhythm. No murmur, rub or gallop. Respiratory. Clear to auscultation bilaterally no wheezes, rales or rhonchi. Normal respiratory effort. Musculoskeletal. Moves all extremities. The thighs are soft and do not appear edematous. Neurologic. Nonfocal.  Data Reviewed: Chemistry: Capillary blood sugars stable. Heme: Hemoglobin 6.6. Iron less than 10. ID: Blood cultures 1/2 coag negative staph. Repeat BC pending, NG thus far. Wound cultures left and right knee pending.   Scheduled Meds: . baclofen  10 mg Oral TID  . cefTRIAXone (ROCEPHIN)  IV  2 g Intravenous Q24H  . insulin aspart  0-15 Units Subcutaneous TID WC  . insulin aspart  0-5 Units Subcutaneous QHS  . lactobacillus acidophilus  1 tablet Oral BID  . lactose free nutrition  237 mL Oral TID WC  . lisinopril  20 mg Oral Daily  . rivaroxaban  20 mg Oral Q breakfast  . sertraline  12.5 mg Oral Daily  . [START ON 05/01/2014] sertraline  25 mg Oral Daily  . simvastatin  20 mg Oral q1800  . sodium chloride  3 mL Intravenous Q12H  . sodium chloride  3 mL Intravenous Q12H  . vancomycin  1,000 mg Intravenous Q12H  . Vitamin D (Ergocalciferol)  50,000 Units Oral Once per day on Mon Fri   Continuous Infusions:    Principal Problem:   Bacterial infection of knee joint Active Problems:   Essential hypertension   Diabetes mellitus type II, controlled   Hyperlipidemia   DVT of lower extremity, bilateral   Septic joint   Protein-calorie malnutrition, severe   Acute blood loss anemia   Time spent 25 minutes

## 2014-04-29 NOTE — Progress Notes (Addendum)
Covering Clinical Education officer, museum (CSW) informed that pt will not be dc'ing to [facility] today. CSW confirmed with Silvano Rusk from [facility] that pt is able to dc over the weekend as long as they receive the dc summary before 2pm on day of dc.  Hunt Oris, MSW, [place]

## 2014-04-30 DIAGNOSIS — E119 Type 2 diabetes mellitus without complications: Secondary | ICD-10-CM | POA: Diagnosis not present

## 2014-04-30 DIAGNOSIS — D62 Acute posthemorrhagic anemia: Secondary | ICD-10-CM | POA: Diagnosis not present

## 2014-04-30 DIAGNOSIS — M009 Pyogenic arthritis, unspecified: Secondary | ICD-10-CM | POA: Diagnosis not present

## 2014-04-30 DIAGNOSIS — I82409 Acute embolism and thrombosis of unspecified deep veins of unspecified lower extremity: Secondary | ICD-10-CM | POA: Diagnosis not present

## 2014-04-30 LAB — TYPE AND SCREEN
ABO/RH(D): AB POS
Antibody Screen: NEGATIVE
Unit division: 0
Unit division: 0
Unit division: 0

## 2014-04-30 LAB — CBC
HCT: 28.2 % — ABNORMAL LOW (ref 39.0–52.0)
HEMOGLOBIN: 9.3 g/dL — AB (ref 13.0–17.0)
MCH: 28 pg (ref 26.0–34.0)
MCHC: 33 g/dL (ref 30.0–36.0)
MCV: 84.9 fL (ref 78.0–100.0)
Platelets: 631 10*3/uL — ABNORMAL HIGH (ref 150–400)
RBC: 3.32 MIL/uL — AB (ref 4.22–5.81)
RDW: 14.7 % (ref 11.5–15.5)
WBC: 11.6 10*3/uL — AB (ref 4.0–10.5)

## 2014-04-30 LAB — GLUCOSE, CAPILLARY
GLUCOSE-CAPILLARY: 113 mg/dL — AB (ref 70–99)
GLUCOSE-CAPILLARY: 154 mg/dL — AB (ref 70–99)
GLUCOSE-CAPILLARY: 108 mg/dL — AB (ref 70–99)
Glucose-Capillary: 189 mg/dL — ABNORMAL HIGH (ref 70–99)

## 2014-04-30 MED ORDER — CEFTRIAXONE SODIUM 2 G IJ SOLR
2.0000 g | INTRAMUSCULAR | Status: DC
  Administered 2014-04-30 – 2014-05-01 (×2): 2 g via INTRAVENOUS
  Filled 2014-04-30 (×4): qty 2

## 2014-04-30 MED ORDER — SODIUM CHLORIDE 0.9 % IV SOLN
25.0000 mg | Freq: Once | INTRAVENOUS | Status: AC
  Administered 2014-04-30: 25 mg via INTRAVENOUS
  Filled 2014-04-30: qty 0.5

## 2014-04-30 MED ORDER — SODIUM CHLORIDE 0.9 % IV SOLN
1000.0000 mg | Freq: Once | INTRAVENOUS | Status: AC
  Administered 2014-04-30: 1000 mg via INTRAVENOUS
  Filled 2014-04-30 (×2): qty 20

## 2014-04-30 MED ORDER — SODIUM CHLORIDE 0.9 % IV SOLN
25.0000 mg | Freq: Once | INTRAVENOUS | Status: DC
  Filled 2014-04-30: qty 0.5

## 2014-04-30 NOTE — Progress Notes (Signed)
Per MD patient is not medically stable for D/C today. Clinical Education officer, museum (CSW) made [employer] admissions coordinator at [facility] aware of above. CSW will continue to follow and assist as needed.   Blima Rich, [place] Weekend CSW [phone]

## 2014-04-30 NOTE — Progress Notes (Signed)
PROGRESS NOTE  [name] OEV:[id number] DOB: 1941-09-22 DOA: 04/25/2014 PCP: Tawanna Solo, MD  Summary: 73 year old man with history of septic arthritis bilateral knees, underwent orthopedic surgery 5/12, followed by infectious disease and discharged from hospital on IV ceftriaxone 5/18, who was directly admitted 6/8 for operative intervention after failing outpatient IV antibiotics.  Assessment/Plan: 1. Septic arthritis bilateral knees: Persistent. Status post surgery 6/9. Appreciate infectious disease evaluation. 2. Acute blood loss anemia, suspect multifactorial anemia of acute illness and perioperative blood loss. CT of the abdomen and pelvis negative for bleeding, no evidence of retroperitoneal bleeding or GI bleeding. Good response to transfusion. 3. Acute encephalopathy. Likely related to pain medications and acute illness. No focal deficits. No evidence of infection. Urinalysis unremarkable. Slowly improving. 4. 1/2 bottles coag negative staph. Contaminant. Repeat blood cultures pending. 5. BLE DVT diagnosed 5/15 on previous hospitalization. Report not in Epic, however hard copy available as is preliminary report in the progress notes.  6. DM type 2. Stable, continue SSI. 7. HTN. Stable. 8. Severe malnutrition in context of chronic illness   Continue postoperative management per orthopedics, empiric antibiotics, followup cultures.   Ceftriaxone 2gm IV daily for 6 wk using 04/27/14 as day of 1 of 42 End date will be July 22nd. Ffollow up in ID clinic in mid July to evaluate improvement.  Repeat CBC in the morning to assess stability. IV Iron  Telemetry sinus rhythm. Discontinue telemetry.  Anticipate transfer skilled nursing facility in 48 hours if hemoglobin remains stable.  Code Status: full code DVT prophylaxis: Xarelto Family Communication: family present Disposition Plan: return to SNF  [name], MD  [facility]  Pager [phone] If 7PM-7AM, please  contact night-coverage at [email or web address], password [facility] 04/30/2014, 10:50 AM  LOS: 5 days   Consultants:  Orthopedics  Infectious disease  Physical therapy skilled nursing facility  Occupational therapy skilled nursing facility  Procedures:  6/9: Bilateral knee open arthrotomy, synovectomy, antibiotic bead placement, and manipulation under anesthesia of the right knee.  2-D echocardiogram left ventricular ejection fraction 60-65%. Normal wall motion, no regional wall motion abnormalities. Overall very poor image quality.  Transfusion one unit packed red blood cells 6/11  Transfusion 2 units packed red blood cells 6/12  Antibiotics:  Ceftriaxone 6/10 >>    Vancomycin 6/10 >>   HPI/Subjective: Somewhat better today per wife. Still somewhat confused. Eating okay, consuming liquid supplements, crit Sunday and today.  Patient denies pain except for knees. No chest pain or shortness of breath.  Objective: Filed Vitals:   04/29/14 1605 04/29/14 1743 04/29/14 2006 04/30/14 0557  BP: 149/71 143/71 130/65 135/65  Pulse: 105 100 109 96  Temp: 99.6 F (37.6 C) 100.5 F (38.1 C) 99.1 F (37.3 C) 99.4 F (37.4 C)  TempSrc: Axillary Oral Oral Oral  Resp: 18 18 20 18   Height:      Weight:      SpO2: 100% 98% 99% 100%    Intake/Output Summary (Last 24 hours) at 04/30/14 1050 Last data filed at 04/30/14 0800  Gross per 24 hour  Intake 1071.67 ml  Output   2100 ml  Net -1028.33 ml     Filed Weights   04/25/14 1352  Weight: 73.936 kg (163 lb)    Exam:  Maximum temperature 100.5, vital signs are stable. No hypoxia.  Gen. He appears better today, calm, appears comfortable. Psych. Mildly confused but follows commands, alert. Cardiovascular. Regular rate and rhythm. No murmur, rub or gallop. No lower extremity edema. Tachycardia appears to  have resolved. Respiratory. Clear to auscultation bilaterally. No wheezes, rales or rhonchi. Normal respiratory effort. Abdomen.  Soft Musculoskeletal. Moves all extremities. Neurologic. Grossly nonfocal.  Data Reviewed: Chemistry: Capillary blood sugars stable. Heme: Hemoglobin 9.3  Iron less than 10. ID: Blood cultures 1/2 coag negative staph. Repeat BC pending, NG thus far. Wound cultures left and right knee pending.   Scheduled Meds: . baclofen  10 mg Oral TID  . cefTRIAXone (ROCEPHIN)  IV  2 g Intravenous Q24H  . insulin aspart  0-15 Units Subcutaneous TID WC  . insulin aspart  0-5 Units Subcutaneous QHS  . lactobacillus acidophilus  1 tablet Oral BID  . lactose free nutrition  237 mL Oral TID WC  . lisinopril  20 mg Oral Daily  . rivaroxaban  20 mg Oral Q breakfast  . sertraline  12.5 mg Oral Daily  . [START ON 05/01/2014] sertraline  25 mg Oral Daily  . simvastatin  20 mg Oral q1800  . sodium chloride  3 mL Intravenous Q12H  . sodium chloride  3 mL Intravenous Q12H  . Vitamin D (Ergocalciferol)  50,000 Units Oral Once per day on Mon Fri   Continuous Infusions:    Principal Problem:   Bacterial infection of knee joint Active Problems:   Essential hypertension   Diabetes mellitus type II, controlled   Hyperlipidemia   DVT of lower extremity, bilateral   Septic joint   Protein-calorie malnutrition, severe   Acute blood loss anemia   Time spent 15 minutes

## 2014-04-30 NOTE — Progress Notes (Signed)
Subjective: Pt stable but slightly confused  Objective: Vital signs in last 24 hours: Temp:  [98.3 F (36.8 C)-100.5 F (38.1 C)] 99.4 F (37.4 C) (06/13 0557) Pulse Rate:  [96-125] 96 (06/13 0557) Resp:  [18-20] 18 (06/13 0557) BP: (130-149)/(65-78) 135/65 mmHg (06/13 0557) SpO2:  [98 %-100 %] 100 % (06/13 0557)  Intake/Output from previous day: 06/12 0701 - 06/13 0700 In: 1050 [P.O.:360; Blood:690] Out: 2100 [Urine:2100] Intake/Output this shift:    Exam:  Knee exam stable.  Dressing c/d/i  Labs:  Recent Labs  04/28/14 0445 04/28/14 1543 04/29/14 0405 04/30/14 0425  HGB 6.2* 7.8* 6.6* 9.3*    Recent Labs  04/29/14 0405 04/30/14 0425  WBC 10.9* 11.6*  RBC 2.44* 3.32*  HCT 20.5* 28.2*  PLT 573* 631*    Recent Labs  04/29/14 0405  NA 137  K 4.1  CL 96  CO2 30  BUN 12  CREATININE 0.69  GLUCOSE 127*  CALCIUM 10.9*   No results found for this basename: LABPT, INR,  in the last 72 hours  Assessment/Plan: CPM bilateral knees - 1hour each 3 times a day - 0 - 30 degrees increase daily - zero boot for both legs when not in cpm plus knee immobilizer for right leg  Good response from transfusion  Patient is stable from ortho standpoint  Confusion per primary team   Marianna Payment 04/30/2014, 8:03 AM

## 2014-05-01 DIAGNOSIS — M009 Pyogenic arthritis, unspecified: Secondary | ICD-10-CM | POA: Diagnosis not present

## 2014-05-01 DIAGNOSIS — I82409 Acute embolism and thrombosis of unspecified deep veins of unspecified lower extremity: Secondary | ICD-10-CM | POA: Diagnosis not present

## 2014-05-01 DIAGNOSIS — D62 Acute posthemorrhagic anemia: Secondary | ICD-10-CM | POA: Diagnosis not present

## 2014-05-01 DIAGNOSIS — E119 Type 2 diabetes mellitus without complications: Secondary | ICD-10-CM | POA: Diagnosis not present

## 2014-05-01 LAB — CBC
HEMATOCRIT: 29.6 % — AB (ref 39.0–52.0)
HEMOGLOBIN: 9.6 g/dL — AB (ref 13.0–17.0)
MCH: 27.7 pg (ref 26.0–34.0)
MCHC: 32.4 g/dL (ref 30.0–36.0)
MCV: 85.3 fL (ref 78.0–100.0)
Platelets: 635 10*3/uL — ABNORMAL HIGH (ref 150–400)
RBC: 3.47 MIL/uL — ABNORMAL LOW (ref 4.22–5.81)
RDW: 15 % (ref 11.5–15.5)
WBC: 12.5 10*3/uL — AB (ref 4.0–10.5)

## 2014-05-01 LAB — ANAEROBIC CULTURE

## 2014-05-01 LAB — GLUCOSE, CAPILLARY
GLUCOSE-CAPILLARY: 103 mg/dL — AB (ref 70–99)
GLUCOSE-CAPILLARY: 114 mg/dL — AB (ref 70–99)
GLUCOSE-CAPILLARY: 135 mg/dL — AB (ref 70–99)
GLUCOSE-CAPILLARY: 124 mg/dL — AB (ref 70–99)

## 2014-05-01 MED ORDER — TRAMADOL HCL 50 MG PO TABS
50.0000 mg | ORAL_TABLET | Freq: Four times a day (QID) | ORAL | Status: DC | PRN
  Administered 2014-05-01 (×2): 50 mg via ORAL
  Filled 2014-05-01 (×2): qty 1

## 2014-05-01 NOTE — Progress Notes (Signed)
Condom cath placed on request of patient and family for night and nocturia

## 2014-05-01 NOTE — Progress Notes (Signed)
PROGRESS NOTE  [name] XTK:[id number] DOB: 08/03/1941 DOA: 04/25/2014 PCP: Brandon Solo, MD  Summary: 73 year old man with history of septic arthritis bilateral knees, underwent orthopedic surgery 5/12, followed by infectious disease and discharged from hospital on IV ceftriaxone 5/18, who was directly admitted 6/8 for operative intervention after failing outpatient IV antibiotics.  Assessment/Plan: 1. Septic arthritis bilateral knees: Persistent. Status post surgery 6/9. Continue Rocephin through July 22. 2. Acute blood loss anemia, suspect multifactorial anemia of acute illness and perioperative blood loss. Stable after transfusion. Given IV iron infusion 6/13. CT of the abdomen and pelvis negative for bleeding, no evidence of retroperitoneal bleeding or GI bleeding. No further evaluation suggested at this point. 3. Acute encephalopathy. Likely related to pain medications and acute illness. No focal deficits. No evidence of infection. Urinalysis unremarkable. Continues to slowly improve. 4. 1/2 bottles coag negative staph. Contaminant. Repeat blood cultures pending. 5. BLE DVT diagnosed 5/15 on previous hospitalization. Report not in Epic, however hard copy available as is preliminary report in the progress notes.  6. DM type 2. Stable, continue SSI. 7. HTN. Stable. 8. Severe malnutrition in context of chronic illness   Ceftriaxone 2gm IV daily for 6 wk using 04/27/14 as day of 1 of 42 End date will be July 22nd. Ffollow up in ID clinic in mid July to evaluate improvement.  Anticipate return to skilled nursing facility 6/15 were discussed with wife at bedside.   Code Status: full code DVT prophylaxis: Xarelto Family Communication: family present Disposition Plan: return to SNF  Brandon Hodgkins, MD  [facility]  Pager [phone] If 7PM-7AM, please contact night-coverage at [email or web address], password [facility] 05/01/2014, 1:04 PM  LOS: 6 days    Consultants:  Orthopedics  Infectious disease  Physical therapy skilled nursing facility  Occupational therapy skilled nursing facility  Procedures:  6/9: Bilateral knee open arthrotomy, synovectomy, antibiotic bead placement, and manipulation under anesthesia of the right knee.  2-D echocardiogram left ventricular ejection fraction 60-65%. Normal wall motion, no regional wall motion abnormalities. Overall very poor image quality.  Transfusion one unit packed red blood cells 6/11  Transfusion 2 units packed red blood cells 6/12  Antibiotics:  Ceftriaxone 6/10 >>  July 22nd  Vancomycin 6/10 >> 6/12  HPI/Subjective: Poor appetite. Left knee pain. Mildly confused.  Objective: Filed Vitals:   04/30/14 2057 05/01/14 0000 05/01/14 0400 05/01/14 0652  BP: 133/73   132/69  Pulse: 94   85  Temp: 97.6 F (36.4 C)   99 F (37.2 C)  TempSrc: Oral   Oral  Resp: 20 18 18 24   Height:      Weight:      SpO2: 95% 95% 96% 98%    Intake/Output Summary (Last 24 hours) at 05/01/14 1304 Last data filed at 05/01/14 0701  Gross per 24 hour  Intake    240 ml  Output   1800 ml  Net  -1560 ml     Filed Weights   04/25/14 1352  Weight: 73.936 kg (163 lb)    Exam:  Afebrile, VSS, no hypoxia, tachypnea or tachycardia. Gen. Appears calm and comfortable sitting in chair. Psych. Alert, mildly confused Cardiovascular. Regular rate and rhythm. No murmur, rub or gallop. No lower extremity edema. Respiratory. Clear to auscultation bilaterally. No wheezes, rales or rhonchi. Normal respiratory effort.  Data Reviewed: I/O: Adequate urine output. Capillary blood sugars stable. Heme: Hemoglobin stable 9.6. ID: Blood cultures no growth to date except for one bottle with coag-negative staph. Wound cultures no growth,  final.  Scheduled Meds: . baclofen  10 mg Oral TID  . cefTRIAXone (ROCEPHIN)  IV  2 g Intravenous Q24H  . insulin aspart  0-15 Units Subcutaneous TID WC  . insulin  aspart  0-5 Units Subcutaneous QHS  . iron dextran (INFED/DEXFERRUM) infusion  25 mg Intravenous Once  . lactobacillus acidophilus  1 tablet Oral BID  . lactose free nutrition  237 mL Oral TID WC  . lisinopril  20 mg Oral Daily  . rivaroxaban  20 mg Oral Q breakfast  . sertraline  25 mg Oral Daily  . simvastatin  20 mg Oral q1800  . sodium chloride  3 mL Intravenous Q12H  . sodium chloride  3 mL Intravenous Q12H  . Vitamin D (Ergocalciferol)  50,000 Units Oral Once per day on Mon Fri   Continuous Infusions:    Principal Problem:   Bacterial infection of knee joint Active Problems:   Essential hypertension   Diabetes mellitus type II, controlled   Hyperlipidemia   DVT of lower extremity, bilateral   Septic joint   Protein-calorie malnutrition, severe   Acute blood loss anemia   Time spent 15 minutes

## 2014-05-02 DIAGNOSIS — R262 Difficulty in walking, not elsewhere classified: Secondary | ICD-10-CM | POA: Diagnosis not present

## 2014-05-02 DIAGNOSIS — E41 Nutritional marasmus: Secondary | ICD-10-CM | POA: Diagnosis not present

## 2014-05-02 DIAGNOSIS — Z79899 Other long term (current) drug therapy: Secondary | ICD-10-CM | POA: Diagnosis not present

## 2014-05-02 DIAGNOSIS — M79609 Pain in unspecified limb: Secondary | ICD-10-CM | POA: Diagnosis not present

## 2014-05-02 DIAGNOSIS — M6250 Muscle wasting and atrophy, not elsewhere classified, unspecified site: Secondary | ICD-10-CM | POA: Diagnosis not present

## 2014-05-02 DIAGNOSIS — R5381 Other malaise: Secondary | ICD-10-CM | POA: Diagnosis not present

## 2014-05-02 DIAGNOSIS — E119 Type 2 diabetes mellitus without complications: Secondary | ICD-10-CM | POA: Diagnosis not present

## 2014-05-02 DIAGNOSIS — H5440 Blindness, one eye, unspecified eye: Secondary | ICD-10-CM | POA: Diagnosis not present

## 2014-05-02 DIAGNOSIS — Z87891 Personal history of nicotine dependence: Secondary | ICD-10-CM | POA: Diagnosis not present

## 2014-05-02 DIAGNOSIS — M25559 Pain in unspecified hip: Secondary | ICD-10-CM | POA: Diagnosis not present

## 2014-05-02 DIAGNOSIS — F3289 Other specified depressive episodes: Secondary | ICD-10-CM | POA: Diagnosis not present

## 2014-05-02 DIAGNOSIS — R079 Chest pain, unspecified: Secondary | ICD-10-CM | POA: Diagnosis not present

## 2014-05-02 DIAGNOSIS — M62838 Other muscle spasm: Secondary | ICD-10-CM | POA: Diagnosis not present

## 2014-05-02 DIAGNOSIS — I82409 Acute embolism and thrombosis of unspecified deep veins of unspecified lower extremity: Secondary | ICD-10-CM | POA: Diagnosis not present

## 2014-05-02 DIAGNOSIS — R0789 Other chest pain: Secondary | ICD-10-CM | POA: Diagnosis not present

## 2014-05-02 DIAGNOSIS — D62 Acute posthemorrhagic anemia: Secondary | ICD-10-CM | POA: Diagnosis not present

## 2014-05-02 DIAGNOSIS — Z7901 Long term (current) use of anticoagulants: Secondary | ICD-10-CM | POA: Diagnosis not present

## 2014-05-02 DIAGNOSIS — I10 Essential (primary) hypertension: Secondary | ICD-10-CM | POA: Diagnosis not present

## 2014-05-02 DIAGNOSIS — F329 Major depressive disorder, single episode, unspecified: Secondary | ICD-10-CM | POA: Diagnosis not present

## 2014-05-02 DIAGNOSIS — Z5189 Encounter for other specified aftercare: Secondary | ICD-10-CM | POA: Diagnosis not present

## 2014-05-02 DIAGNOSIS — Z792 Long term (current) use of antibiotics: Secondary | ICD-10-CM | POA: Diagnosis not present

## 2014-05-02 DIAGNOSIS — M009 Pyogenic arthritis, unspecified: Secondary | ICD-10-CM | POA: Diagnosis not present

## 2014-05-02 DIAGNOSIS — R609 Edema, unspecified: Secondary | ICD-10-CM | POA: Diagnosis not present

## 2014-05-02 DIAGNOSIS — Z4789 Encounter for other orthopedic aftercare: Secondary | ICD-10-CM | POA: Diagnosis not present

## 2014-05-02 DIAGNOSIS — E785 Hyperlipidemia, unspecified: Secondary | ICD-10-CM | POA: Diagnosis not present

## 2014-05-02 DIAGNOSIS — F988 Other specified behavioral and emotional disorders with onset usually occurring in childhood and adolescence: Secondary | ICD-10-CM | POA: Diagnosis not present

## 2014-05-02 DIAGNOSIS — M25569 Pain in unspecified knee: Secondary | ICD-10-CM | POA: Diagnosis not present

## 2014-05-02 LAB — GLUCOSE, CAPILLARY
Glucose-Capillary: 107 mg/dL — ABNORMAL HIGH (ref 70–99)
Glucose-Capillary: 131 mg/dL — ABNORMAL HIGH (ref 70–99)

## 2014-05-02 LAB — CULTURE, BLOOD (ROUTINE X 2): Culture: NO GROWTH

## 2014-05-02 MED ORDER — HEPARIN SOD (PORK) LOCK FLUSH 100 UNIT/ML IV SOLN
250.0000 [IU] | INTRAVENOUS | Status: AC | PRN
  Administered 2014-05-02: 250 [IU]

## 2014-05-02 MED ORDER — BACLOFEN 10 MG PO TABS: 5.0000 mg | ORAL_TABLET | Freq: Three times a day (TID) | ORAL | Status: DC | PRN

## 2014-05-02 MED ORDER — LORAZEPAM 2 MG/ML IJ SOLN
0.5000 mg | Freq: Once | INTRAMUSCULAR | Status: AC
  Administered 2014-05-02: 0.5 mg via INTRAVENOUS
  Filled 2014-05-02: qty 1

## 2014-05-02 MED ORDER — DEXTROSE 5 % IV SOLN: 2.0000 g | INTRAVENOUS | Status: DC

## 2014-05-02 NOTE — Progress Notes (Signed)
Clinical social worker assisted with patient discharge to skilled nursing facility, [facility].  CSW addressed all family questions and concerns. CSW copied chart and added all important documents. CSW also set up patient transportation with Diplomatic Services operational officer. Clinical Social Worker will sign off for now as social work intervention is no longer needed.   Rhea Pink, MSW, [place]

## 2014-05-02 NOTE — Progress Notes (Signed)
Physical Therapy Treatment Patient Details Name: [name] MRN: [id number] DOB: [birth year] Today's Date: 05/02/2014    History of Present Illness 73 year old man with history of septic arthritis bilateral knees, underwent orthopedic surgery 5/12, followed by infectious disease and discharged from hospital on IV ceftriaxone 5/18, who was directly admitted 6/8 for operative intervention after failing outpatient IV antibiotics.    PT Comments    Patient progressing towards physical therapy goals, slowly increasing independence however limited by pain and confusion today. He was however able to stand with physical therapy this AM and performed pivot transfer to chair with Mod assist. Patient will continue to benefit from skilled physical therapy services at SNF to further improve independence with functional mobility.   Follow Up Recommendations  SNF     Equipment Recommendations  None recommended by PT    Recommendations for Other Services OT consult     Precautions / Restrictions Precautions Precautions: Fall Required Braces or Orthoses: Knee Immobilizer - Right Restrictions Weight Bearing Restrictions: Yes RLE Weight Bearing: Weight bearing as tolerated LLE Weight Bearing: Weight bearing as tolerated    Mobility  Bed Mobility Overal bed mobility: Needs Assistance;+2 for physical assistance Bed Mobility: Supine to Sit     Supine to sit: +2 for physical assistance;HOB elevated;Max assist     General bed mobility comments: Max assist +2 for supine>sit - pt able to assist minimally with trunk control and by pulling up with LUE. VCs for technique and assist required for trunk control and LE support bilaterally.  Transfers Overall transfer level: Needs assistance Equipment used: 2 person hand held assist Transfers: Sit to/from [facility] Sit to Stand: +2 physical assistance;Mod assist;From elevated surface Stand pivot transfers: Mod assist        General transfer comment: Mod assist for sit<>stand from elevated bed for boost. Verbal and tactile cues for hand placement and forward weight shift. Educated on use of DME for safety with turn/pivot. Mod assist +1 during pivot to advance RLE forward and for RW control. Decreased control with sitting.  Ambulation/Gait                 Stairs            Wheelchair Mobility    Modified Rankin (Stroke Patients Only)       Balance                                    Cognition Arousal/Alertness: Lethargic Behavior During Therapy: Anxious Overall Cognitive Status: Impaired/Different from baseline Area of Impairment: Orientation;Attention;Memory;Following commands;Safety/judgement;Awareness;Problem solving Orientation Level: Disoriented to;Place;Time;Situation Current Attention Level: Focused Memory: Decreased short-term memory;Decreased recall of precautions Following Commands: Follows one step commands inconsistently Safety/Judgement: Decreased awareness of safety;Decreased awareness of deficits Awareness: Intellectual Problem Solving: Slow processing;Decreased initiation;Difficulty sequencing;Requires verbal cues;Requires tactile cues General Comments: Difficulty following directions - thinks PT is his wife at times. Granddaugther in room comforting pt.    Exercises Other Exercises Other Exercises: Passive right knee extension with over pressure - sustained stretch >3 minutes with mild distraction for pain.    General Comments General comments (skin integrity, edema, etc.): Took considerable amount of time to straighten RLE at knee for extension to place in knee immobilizer. Pt was tearful during this process but with much less pain throughout remainder of therapy session. Still confused and granddaughter present providing comfort.      Pertinent Vitals/Pain Pt tearful due to  pain with passive stretching. No numerical value for pain given. Patient  repositioned in chair for comfort.     Home Living                      Prior Function            PT Goals (current goals can now be found in the care plan section) Acute Rehab PT Goals Time For Goal Achievement: 05/11/14 Potential to Achieve Goals: Fair Progress towards PT goals: Progressing toward goals    Frequency  Min 3X/week    PT Plan Current plan remains appropriate    Co-evaluation             End of Session Equipment Utilized During Treatment: Gait belt Activity Tolerance: Patient limited by pain Patient left: in chair;with call bell/phone within reach;with chair alarm set;with family/visitor present     Time: 2060-1561 PT Time Calculation (min): 32 min  Charges:  $Therapeutic Activity: 23-37 mins                    G Codes:       [employer], [place]  Ellouise Newer 05/02/2014, 1:19 PM

## 2014-05-02 NOTE — Progress Notes (Signed)
Ativan 0.5 mg given IV per MD order. Will attempt further patient care after patient calms down. Family remains at bedside.

## 2014-05-02 NOTE — Discharge Summary (Signed)
Physician Discharge Summary  [name] WFU:[id number] DOB: 15-Mar-1941 DOA: 04/25/2014  PCP: Tawanna Solo, MD  Admit date: 04/25/2014 Discharge date: 05/02/2014  Recommendations for Outpatient Follow-up:  1. Resolution of septic arthritis bilateral knees 2. Acute blood loss anemia, followup response to iron infusion, consider repeat CBC in one week. 3. Acute encephalopathy 4. Continued anticoagulation for bilateral lower extremity DVT diagnosed 5/15. 5. Severe malnutrition  6. Possible mucus in trachea on CT scan, no clinical symptoms, hypoxia or tachypnea. Doubt clinically significant, could consider repeat CT scan as an outpatient. See below. Ceftriaxone 2gm IV daily for 6 wk using 04/27/14 as day of 1 of 42 End date will be July 22nd. Follow up in ID clinic in mid July to evaluate improvement.  Discussed with Dr. Marlou Sa, plan followup in his office on Thursday 6/18. CPM bilateral knees - 1hour each 3 times a day - 0 - 30 degrees increase daily - zero boot for both legs when not in cpm plus knee immobilizer for right leg. WBAT.    Follow-up Information   Follow up with Meredith Pel, MD. Schedule an appointment as soon as possible for a visit on 05/02/2014.   Specialty:  Orthopedic Surgery   Contact information:   [place] [address] [phone]       Follow up with Carlyle Basques, MD In 4 weeks.   Specialty:  Infectious Diseases   Contact information:   [place] [address] [place] [address] [phone]      Discharge Diagnoses:  1. Septic arthritis bilateral knees 2. Acute blood loss anemia 3. Acute encephalopathy 4. History of bilateral lower extremity DVT diagnosed 03/2014 5. Diabetes mellitus type 2 6. Severe malnutrition in context of chronic illness  Discharge Condition: Improved Disposition: Return to SNF  Diet recommendation: Diabetic diet  Filed Weights   04/25/14 1352  Weight: 73.936 kg (163 lb)    History  of present illness:  73 year old man with history of septic arthritis bilateral knees, underwent orthopedic surgery 5/12, followed by infectious disease and discharged from hospital on IV ceftriaxone 5/18, who was directly admitted 6/8 for operative intervention after failing outpatient IV antibiotics.  Hospital Course:  [name] underwent surgery for bilateral knee infections without apparent complication. Postoperatively he was noted to have acute on chronic anemia, further investigation included CT of the abdomen and pelvis which was unremarkable and there is no evidence of GI bleed. Of note his hemoglobin was normal before infection began by laboratory study 03/20/2014. Suspect multifactorial as below, he was treated with iron infusion. Acute encephalopathy has gradually improved and will likely improve as his condition continues to improve.  1. Septic arthritis bilateral knees: Stable. Status post surgery 6/9. Continue Rocephin through July 22. 2. Acute blood loss anemia, suspect multifactorial anemia of acute illness and perioperative blood loss. Stable. Given IV iron infusion 6/13. CT of the abdomen and pelvis negative for bleeding, no evidence of retroperitoneal bleeding or GI bleeding. No further evaluation suggested at this point. 3. Acute encephalopathy. Likely related to pain medications and acute illness. No focal deficits. No evidence of infection. Urinalysis unremarkable. Possibly related to Tramadol. 4. 1/2 bottles coag negative staph. Contaminant. Repeat blood cultures pending, NGTD. 5. BLE DVT diagnosed 5/15 on previous hospitalization. Report not in Epic, however hard copy available as is preliminary report in the progress notes.  6. DM type 2. Stable, continue SSI. 7. HTN. Stable. 8. Severe malnutrition in context of chronic illness Ceftriaxone 2gm IV  daily for 6 wk using 04/27/14 as day of 1 of 42 End date will be July 22nd. Follow up in ID clinic in mid July to evaluate  improvement.  Discussed with Dr. Marlou Sa, plan followup in his office on Thursday 6/18. CPM bilateral knees - 1hour each 3 times a day - 0 - 30 degrees increase daily - zero boot for both legs when not in cpm plus knee immobilizer for right leg. WBAT.  Discussed in detail with wife at bedside.  Consultants:  Orthopedics  Infectious disease  Physical therapy skilled nursing facility  Occupational therapy skilled nursing facility Procedures:  6/9: Bilateral knee open arthrotomy, synovectomy, antibiotic bead placement, and manipulation under anesthesia of the right knee.  2-D echocardiogram left ventricular ejection fraction 60-65%. Normal wall motion, no regional wall motion abnormalities. Overall very poor image quality.  Transfusion one unit packed red blood cells 6/11  Transfusion 2 units packed red blood cells 6/12 Antibiotics:  Ceftriaxone 6/10 >> July 22nd  Vancomycin 6/10 >> 6/12  Discharge Instructions  Discharge Instructions   Call MD / Call 911    Complete by:  As directed   If you experience chest pain or shortness of breath, CALL 911 and be transported to the hospital emergency room.  If you develope a fever above 101 F, pus (white drainage) or increased drainage or redness at the wound, or calf pain, call your surgeon's office.     Constipation Prevention    Complete by:  As directed   Drink plenty of fluids.  Prune juice may be helpful.  You may use a stool softener, such as Colace (over the counter) 100 mg twice a day.  Use MiraLax (over the counter) for constipation as needed.     Diet - low sodium heart healthy    Complete by:  As directed      Discharge instructions    Complete by:  As directed   Weight bearing as tolerated and knee range of motion as tolerated bilateral lower extremities Keep incisions dry CPM 1 hour each knee 3 times a day Keep knee immobilizer on right knee while in bed     Increase activity slowly as tolerated    Complete by:  As directed       Weight bearing as tolerated    Complete by:  As directed             Medication List    STOP taking these medications       cefTRIAXone 1 g in dextrose 5 % 50 mL  Replaced by:  cefTRIAXone 2 g in dextrose 5 % 50 mL      TAKE these medications       baclofen 10 MG tablet  Commonly known as:  LIORESAL  Take 0.5 tablets (5 mg total) by mouth 3 (three) times daily as needed for muscle spasms.     cefTRIAXone 2 g in dextrose 5 % 50 mL  Inject 2 g into the vein daily. Ceftriaxone 2gm IV daily for 6 wk using 04/27/14 as day of 1 of 42 End date will be July 22nd. Follow up in ID clinic in mid July to evaluate improvement.     ergocalciferol 50000 UNITS capsule  Commonly known as:  VITAMIN D2  Take 50,000 Units by mouth See admin instructions. Take 1 capsule twice weekly for 4 weeks (Mondays and Fridays)     GLUCERNA Liqd  Take 1 Can by mouth 3 (three) times daily between meals.  HYDROcodone-acetaminophen 10-325 MG per tablet  Commonly known as:  NORCO  Take 1 tablet by mouth every 4 (four) hours as needed.     lactobacillus acidophilus Tabs tablet  Take 1 tablet by mouth 2 (two) times daily. INDEFINITELY WHILE ON ANTIBIOTICS     lisinopril 20 MG tablet  Commonly known as:  PRINIVIL,ZESTRIL  Take 20 mg by mouth daily.     loperamide 2 MG tablet  Commonly known as:  IMODIUM A-D  Take 2 mg by mouth 4 (four) times daily as needed for diarrhea or loose stools.     menthol-cetylpyridinium 3 MG lozenge  Commonly known as:  CEPACOL  Take 1 lozenge (3 mg total) by mouth as needed for sore throat.     metFORMIN 500 MG 24 hr tablet  Commonly known as:  GLUCOPHAGE-XR  Take 2,000 mg by mouth at bedtime.     rivaroxaban 20 MG Tabs tablet  Commonly known as:  XARELTO  Take 1 tablet (20 mg total) by mouth daily with supper. Start taking 20 mg daily when the 21 days of 15 mg bid is complete     sertraline 25 MG tablet  Commonly known as:  ZOLOFT  Take 12.5-25 mg by mouth daily.      simvastatin 20 MG tablet  Commonly known as:  ZOCOR  Take 20 mg by mouth daily.       No Known Allergies  The results of significant diagnostics from this hospitalization (including imaging, microbiology, ancillary and laboratory) are listed below for reference.    Significant Diagnostic Studies: Ct Angio Chest Pe W/cm &/or Wo Cm  04/29/2014   CLINICAL DATA:  Anemia, DVT  EXAM: CT ANGIOGRAPHY CHEST  CT ABDOMEN AND PELVIS WITH CONTRAST  TECHNIQUE: Multidetector CT imaging of the chest was performed using the standard protocol during bolus administration of intravenous contrast. Multiplanar CT image reconstructions and MIPs were obtained to evaluate the vascular anatomy. Multidetector CT imaging of the abdomen and pelvis was performed using the standard protocol during bolus administration of intravenous contrast.  CONTRAST:  132mL OMNIPAQUE IOHEXOL 350 MG/ML SOLN  COMPARISON:  None.  FINDINGS: CTA CHEST FINDINGS  The lungs are well aerated bilaterally. No focal infiltrate or effusion is seen. A PICC line is noted on the right. The thoracic inlet shows enlargement of the left lobe of the thyroid with heterogeneous enhancement likely related to underlying nodules. The thoracic aorta shows calcification without aneurysmal dilatation or dissection. The pulmonary artery shows a normal branching pattern without intraluminal filling defect to suggest pulmonary emboli. The trachea and bronchial tree are well visualized. There are soft tissue densities projecting into the trachea best seen on images 28 and 88 of series 5. These may represent areas of mucous although focal soft tissue lesions could not be totally excluded. No obstructive changes are seen in the bronchial tree. No significant hilar or mediastinal adenopathy is noted. Heavy coronary calcifications are noted. No focal bony abnormality is noted.  CT ABDOMEN and PELVIS FINDINGS  Motion artifact somewhat limits the examination. The liver, spleen,  gallbladder, adrenal glands and pancreas are normal in their CT appearance. The kidneys are well visualized and demonstrate a normal enhancement pattern. A left renal cyst is noted measuring just over 1 cm. No renal calculi or obstructive changes are seen.  The appendix is within normal limits. The bladder is partially distended. There is enlargement of the prostate gland indenting upon the inferior aspect of the urinary bladder. No pelvic mass lesion is  seen. No adenopathy is noted. Diffuse aortic calcifications are seen. The osseous structures are within normal limits.  Review of the MIP images confirms the above findings.  IMPRESSION: CTA of the chest:  No evidence of pulmonary embolism.  Left thyroid nodularity.  Further evaluation is recommended.  Soft tissue densities within the trachea as described. This may be related to mucous although underlying lesions could not be totally excluded. Direct visualization may be helpful. Alternatively a follow-up CT examination may be helpful. This could be performed without contrast material.  CT of the abdomen and pelvis:  Left renal cyst.  Prostatic hypertrophy.  No other focal abnormality is seen.   Electronically Signed   By: Inez Catalina M.D.   On: 04/29/2014 14:37   Microbiology: Recent Results (from the past 240 hour(s))  CULTURE, BLOOD (ROUTINE X 2)     Status: None   Collection Time    04/25/14  6:35 PM      Result Value Ref Range Status   Specimen Description BLOOD LEFT ARM   Final   Special Requests     Final   Value: BOTTLES DRAWN AEROBIC AND ANAEROBIC BLUE 2.5CC RED 1CC   Culture  Setup Time     Final   Value: 04/25/2014 22:46     Performed at [facility]   Culture     Final   Value: STAPHYLOCOCCUS SPECIES (COAGULASE NEGATIVE)     Note: THE SIGNIFICANCE OF ISOLATING THIS ORGANISM FROM A SINGLE SET OF BLOOD CULTURES WHEN MULTIPLE SETS ARE DRAWN IS UNCERTAIN. PLEASE NOTIFY THE MICROBIOLOGY DEPARTMENT WITHIN ONE WEEK IF SPECIATION AND  SENSITIVITIES ARE REQUIRED.     9 Note: Gram Stain Report Called to,Read Back By and Verified With: Rosalie Gums RN EDMOJ [phone]     Performed at [facility]   Report Status 04/28/2014 FINAL   Final  CULTURE, BLOOD (ROUTINE X 2)     Status: None   Collection Time    04/25/14  7:30 PM      Result Value Ref Range Status   Specimen Description BLOOD LEFT ARM   Final   Special Requests BOTTLES DRAWN AEROBIC AND ANAEROBIC 10CC   Final   Culture  Setup Time     Final   Value: 04/26/2014 02:29     Performed at [facility]   Culture     Final   Value: NO GROWTH 5 DAYS     Performed at [facility]   Report Status 05/02/2014 FINAL   Final  BODY FLUID CULTURE     Status: None   Collection Time    04/26/14 12:08 PM      Result Value Ref Range Status   Specimen Description SYNOVIAL RIGHT KNEE   Final   Special Requests FLUID ON SWAB   Final   Gram Stain     Final   Value: WBC PRESENT, PREDOMINANTLY MONONUCLEAR     NO ORGANISMS SEEN     Performed at [facility]   Culture     Final   Value: NO GROWTH 3 DAYS     Performed at [facility]   Report Status 04/29/2014 FINAL   Final  ANAEROBIC CULTURE     Status: None   Collection Time    04/26/14 12:08 PM      Result Value Ref Range Status   Specimen Description SYNOVIAL RIGHT KNEE   Final   Special Requests FLUID ON SWAB  Final   Gram Stain     Final   Value: RARE WBC PRESENT, PREDOMINANTLY MONONUCLEAR     NO SQUAMOUS EPITHELIAL CELLS SEEN     NO ORGANISMS SEEN     Performed at [facility]   Culture     Final   Value: NO ANAEROBES ISOLATED     Performed at [facility]   Report Status 05/01/2014 FINAL   Final  BODY FLUID CULTURE     Status: None   Collection Time    04/26/14  1:27 PM      Result Value Ref Range Status   Specimen Description SYNOVIAL FLUID LEFT KNEE   Final   Special Requests SYNOVIAL FLUID LEFT KNEE ON SWAB   Final   Gram Stain     Final   Value: WBC  PRESENT,BOTH PMN AND MONONUCLEAR     NO ORGANISMS SEEN     Performed at [facility]   Culture     Final   Value: NO GROWTH 3 DAYS     Performed at [facility]   Report Status 04/29/2014 FINAL   Final  ANAEROBIC CULTURE     Status: None   Collection Time    04/26/14  1:27 PM      Result Value Ref Range Status   Specimen Description SYNOVIAL FLUID LEFT KNEE   Final   Special Requests SYNOVIAL FLUID LEFT KNEE ON SWAB   Final   Gram Stain     Final   Value: RARE WBC PRESENT,BOTH PMN AND MONONUCLEAR     NO SQUAMOUS EPITHELIAL CELLS SEEN     NO ORGANISMS SEEN     Performed at [facility]   Culture     Final   Value: NO ANAEROBES ISOLATED     Performed at [facility]   Report Status 05/01/2014 FINAL   Final  CULTURE, BLOOD (ROUTINE X 2)     Status: None   Collection Time    04/28/14  3:05 PM      Result Value Ref Range Status   Specimen Description BLOOD RIGHT HAND   Final   Special Requests BOTTLES DRAWN AEROBIC AND ANAEROBIC 10CC   Final   Culture  Setup Time     Final   Value: 04/28/2014 21:09     Performed at [facility]   Culture     Final   Value:        BLOOD CULTURE RECEIVED NO GROWTH TO DATE CULTURE WILL BE HELD FOR 5 DAYS BEFORE ISSUING A FINAL NEGATIVE REPORT     Performed at [facility]   Report Status PENDING   Incomplete  CULTURE, BLOOD (ROUTINE X 2)     Status: None   Collection Time    04/28/14  3:15 PM      Result Value Ref Range Status   Specimen Description BLOOD RIGHT ANTECUBITAL   Final   Special Requests BOTTLES DRAWN AEROBIC ONLY 10CC   Final   Culture  Setup Time     Final   Value: 04/28/2014 21:09     Performed at [facility]   Culture     Final   Value:        BLOOD CULTURE RECEIVED NO GROWTH TO DATE CULTURE WILL BE HELD FOR 5 DAYS BEFORE ISSUING A FINAL NEGATIVE REPORT     Performed at [facility]   Report Status PENDING   Incomplete  Labs: Basic Metabolic Panel:  Recent  Labs Lab 04/25/14 1800 04/26/14 0505 04/29/14 0405  NA 134* 135* 137  K 4.3 4.2 4.1  CL 96 94* 96  CO2 27 24 30   GLUCOSE 120* 116* 127*  BUN 11 8 12   CREATININE 0.66 0.68 0.69  CALCIUM 9.2 9.5 10.9*   Liver Function Tests:  Recent Labs Lab 04/25/14 1800  AST 47*  ALT 35  ALKPHOS 97  BILITOT 0.2*  PROT 7.3  ALBUMIN 1.9*   CBC:  Recent Labs Lab 04/27/14 0431 04/28/14 0445 04/28/14 1543 04/29/14 0405 04/30/14 0425 05/01/14 0502  WBC 12.5* 11.1*  --  10.9* 11.6* 12.5*  HGB 7.4* 6.2* 7.8* 6.6* 9.3* 9.6*  HCT 23.1* 19.8* 23.5* 20.5* 28.2* 29.6*  MCV 85.2 84.6  --  84.0 84.9 85.3  PLT 541* 543*  --  573* 631* 635*   CBG:  Recent Labs Lab 05/01/14 1303 05/01/14 1600 05/01/14 2213 05/02/14 0646 05/02/14 1125  GLUCAP 114* 135* 124* 107* 131*    Principal Problem:   Bacterial infection of knee joint Active Problems:   Essential hypertension   Diabetes mellitus type II, controlled   Hyperlipidemia   DVT of lower extremity, bilateral   Septic joint   Protein-calorie malnutrition, severe   Acute blood loss anemia   Time coordinating discharge: 25 minutes  Signed:  [name], MD [facility] 05/02/2014, 12:31 PM

## 2014-05-02 NOTE — Progress Notes (Signed)
PROGRESS NOTE  FAVOR HACKLER MBW:[id number] DOB: 10/31/1941 DOA: 04/25/2014 PCP: Tawanna Solo, MD  Summary: 73 year old man with history of septic arthritis bilateral knees, underwent orthopedic surgery 5/12, followed by infectious disease and discharged from hospital on IV ceftriaxone 5/18, who was directly admitted 6/8 for operative intervention after failing outpatient IV antibiotics.  Assessment/Plan: 1. Septic arthritis bilateral knees: Stable. Status post surgery 6/9. Continue Rocephin through July 22. 2. Acute blood loss anemia, suspect multifactorial anemia of acute illness and perioperative blood loss. Stable. Given IV iron infusion 6/13. CT of the abdomen and pelvis negative for bleeding, no evidence of retroperitoneal bleeding or GI bleeding. No further evaluation suggested at this point. 3. Acute encephalopathy. Likely related to pain medications and acute illness. No focal deficits. No evidence of infection. Urinalysis unremarkable. Possibly related to Tramadol. 4. 1/2 bottles coag negative staph. Contaminant. Repeat blood cultures pending, NGTD. 5. BLE DVT diagnosed 5/15 on previous hospitalization. Report not in Epic, however hard copy available as is preliminary report in the progress notes.  6. DM type 2. Stable, continue SSI. 7. HTN. Stable. 8. Severe malnutrition in context of chronic illness   Ceftriaxone 2gm IV daily for 6 wk using 04/27/14 as day of 1 of 42 End date will be July 22nd. Follow up in ID clinic in mid July to evaluate improvement.  Discussed with Dr. Marlou Sa, plan followup in his office on Thursday 6/18.  CPM bilateral knees - 1hour each 3 times a day - 0 - 30 degrees increase daily - zero boot for both legs when not in cpm plus knee immobilizer for right leg. WBAT.  Discussed in detail with wife at bedside.  Code Status: full code DVT prophylaxis: Xarelto Family Communication: family present Disposition Plan: return to SNF  [name],  MD  [facility]  Pager [phone] If 7PM-7AM, please contact night-coverage at [email or web address], password [facility] 05/02/2014, 11:58 AM  LOS: 7 days   Consultants:  Orthopedics  Infectious disease  Physical therapy skilled nursing facility  Occupational therapy skilled nursing facility  Procedures:  6/9: Bilateral knee open arthrotomy, synovectomy, antibiotic bead placement, and manipulation under anesthesia of the right knee.  2-D echocardiogram left ventricular ejection fraction 60-65%. Normal wall motion, no regional wall motion abnormalities. Overall very poor image quality.  Transfusion one unit packed red blood cells 6/11  Transfusion 2 units packed red blood cells 6/12  Antibiotics:  Ceftriaxone 6/10 >>  July 22nd  Vancomycin 6/10 >> 6/12  HPI/Subjective: Confused overnight.  Overall feeling okay today. Eating poorly but no other complaints.  Objective: Filed Vitals:   05/02/14 0000 05/02/14 0400 05/02/14 0641 05/02/14 0847  BP:   146/81 149/83  Pulse:   97 99  Temp:   98.9 F (37.2 C) 94.6 F (34.8 C)  TempSrc:   Axillary Axillary  Resp: 18 16 16 28   Height:      Weight:      SpO2: 94% 95% 96% 100%    Intake/Output Summary (Last 24 hours) at 05/02/14 1158 Last data filed at 05/02/14 0644  Gross per 24 hour  Intake     10 ml  Output    200 ml  Net   -190 ml     Filed Weights   04/25/14 1352  Weight: 73.936 kg (163 lb)    Exam:  Gen. Appears calm, comfortable. Psych. Alert, oriented to self, wife and granddaughter present, location, year. Cardiovascular. Regular rate and rhythm. No murmur, rub or gallop. No lower extremity edema. Respiratory.  Clear to auscultation bilaterally. No wheezes, rales or rhonchi. Normal respiratory effort.  Data Reviewed: I/O: Adequate urine output, BM x1 Capillary blood sugars stable. ID: Blood cultures no growth to date except for one bottle with coag-negative staph. Wound cultures no growth,  final.  Scheduled Meds: . baclofen  10 mg Oral TID  . cefTRIAXone (ROCEPHIN)  IV  2 g Intravenous Q24H  . insulin aspart  0-15 Units Subcutaneous TID WC  . insulin aspart  0-5 Units Subcutaneous QHS  . iron dextran (INFED/DEXFERRUM) infusion  25 mg Intravenous Once  . lactobacillus acidophilus  1 tablet Oral BID  . lactose free nutrition  237 mL Oral TID WC  . lisinopril  20 mg Oral Daily  . rivaroxaban  20 mg Oral Q breakfast  . sertraline  25 mg Oral Daily  . simvastatin  20 mg Oral q1800  . sodium chloride  3 mL Intravenous Q12H  . sodium chloride  3 mL Intravenous Q12H  . Vitamin D (Ergocalciferol)  50,000 Units Oral Once per day on Mon Fri   Continuous Infusions:    Principal Problem:   Bacterial infection of knee joint Active Problems:   Essential hypertension   Diabetes mellitus type II, controlled   Hyperlipidemia   DVT of lower extremity, bilateral   Septic joint   Protein-calorie malnutrition, severe   Acute blood loss anemia

## 2014-05-02 NOTE — Progress Notes (Signed)
Report called to [facility]

## 2014-05-02 NOTE — Progress Notes (Signed)
Patient combative and fighting against staff while attempting personal care and cleaning. Family at bedside. Patient states he is in [place].

## 2014-05-02 NOTE — Progress Notes (Signed)
Patient remains agitated but no longer combative. Unable to proceed with change of CPM to rle. Will relay to oncoming shift to please start with rle and than proceed to lle for CPM tx on Monday. Family remains at bedside.

## 2014-05-03 MED FILL — Thrombin For Soln Kit 20000 Unit: CUTANEOUS | Qty: 1 | Status: AC

## 2014-05-04 DIAGNOSIS — M79609 Pain in unspecified limb: Secondary | ICD-10-CM | POA: Diagnosis not present

## 2014-05-04 DIAGNOSIS — I10 Essential (primary) hypertension: Secondary | ICD-10-CM | POA: Diagnosis not present

## 2014-05-04 DIAGNOSIS — M009 Pyogenic arthritis, unspecified: Secondary | ICD-10-CM | POA: Diagnosis not present

## 2014-05-04 DIAGNOSIS — I82409 Acute embolism and thrombosis of unspecified deep veins of unspecified lower extremity: Secondary | ICD-10-CM | POA: Diagnosis not present

## 2014-05-04 DIAGNOSIS — M6250 Muscle wasting and atrophy, not elsewhere classified, unspecified site: Secondary | ICD-10-CM | POA: Diagnosis not present

## 2014-05-04 DIAGNOSIS — M62838 Other muscle spasm: Secondary | ICD-10-CM | POA: Diagnosis not present

## 2014-05-04 LAB — CULTURE, BLOOD (ROUTINE X 2)
CULTURE: NO GROWTH
Culture: NO GROWTH

## 2014-05-09 DIAGNOSIS — M25569 Pain in unspecified knee: Secondary | ICD-10-CM | POA: Diagnosis not present

## 2014-05-09 DIAGNOSIS — M62838 Other muscle spasm: Secondary | ICD-10-CM | POA: Diagnosis not present

## 2014-05-09 DIAGNOSIS — R262 Difficulty in walking, not elsewhere classified: Secondary | ICD-10-CM | POA: Diagnosis not present

## 2014-05-10 ENCOUNTER — Inpatient Hospital Stay: Payer: Medicare Other | Admitting: Infectious Diseases

## 2014-05-11 ENCOUNTER — Emergency Department (HOSPITAL_BASED_OUTPATIENT_CLINIC_OR_DEPARTMENT_OTHER)
Admission: EM | Admit: 2014-05-11 | Discharge: 2014-05-11 | Disposition: A | Discharge status: Home / Self Care | Payer: Medicare Other | Attending: Emergency Medicine | Admitting: Emergency Medicine

## 2014-05-11 ENCOUNTER — Emergency Department (HOSPITAL_BASED_OUTPATIENT_CLINIC_OR_DEPARTMENT_OTHER): Payer: Medicare Other

## 2014-05-11 ENCOUNTER — Encounter (HOSPITAL_BASED_OUTPATIENT_CLINIC_OR_DEPARTMENT_OTHER): Payer: Self-pay | Admitting: Emergency Medicine

## 2014-05-11 DIAGNOSIS — I10 Essential (primary) hypertension: Secondary | ICD-10-CM | POA: Diagnosis not present

## 2014-05-11 DIAGNOSIS — Z792 Long term (current) use of antibiotics: Secondary | ICD-10-CM | POA: Insufficient documentation

## 2014-05-11 DIAGNOSIS — R0789 Other chest pain: Secondary | ICD-10-CM | POA: Insufficient documentation

## 2014-05-11 DIAGNOSIS — R079 Chest pain, unspecified: Secondary | ICD-10-CM | POA: Diagnosis not present

## 2014-05-11 DIAGNOSIS — E785 Hyperlipidemia, unspecified: Secondary | ICD-10-CM | POA: Insufficient documentation

## 2014-05-11 DIAGNOSIS — E119 Type 2 diabetes mellitus without complications: Secondary | ICD-10-CM | POA: Insufficient documentation

## 2014-05-11 DIAGNOSIS — M25559 Pain in unspecified hip: Secondary | ICD-10-CM | POA: Diagnosis not present

## 2014-05-11 DIAGNOSIS — Z79899 Other long term (current) drug therapy: Secondary | ICD-10-CM | POA: Insufficient documentation

## 2014-05-11 DIAGNOSIS — Z87891 Personal history of nicotine dependence: Secondary | ICD-10-CM | POA: Insufficient documentation

## 2014-05-11 DIAGNOSIS — H5440 Blindness, one eye, unspecified eye: Secondary | ICD-10-CM | POA: Insufficient documentation

## 2014-05-11 DIAGNOSIS — F988 Other specified behavioral and emotional disorders with onset usually occurring in childhood and adolescence: Secondary | ICD-10-CM | POA: Insufficient documentation

## 2014-05-11 DIAGNOSIS — Z7901 Long term (current) use of anticoagulants: Secondary | ICD-10-CM | POA: Insufficient documentation

## 2014-05-11 LAB — TROPONIN I

## 2014-05-11 LAB — CBC WITH DIFFERENTIAL/PLATELET
BASOS ABS: 0 10*3/uL (ref 0.0–0.1)
Basophils Relative: 0 % (ref 0–1)
Eosinophils Absolute: 0.2 10*3/uL (ref 0.0–0.7)
Eosinophils Relative: 2 % (ref 0–5)
HCT: 27.8 % — ABNORMAL LOW (ref 39.0–52.0)
Hemoglobin: 8.8 g/dL — ABNORMAL LOW (ref 13.0–17.0)
LYMPHS ABS: 1.7 10*3/uL (ref 0.7–4.0)
LYMPHS PCT: 19 % (ref 12–46)
MCH: 28 pg (ref 26.0–34.0)
MCHC: 31.7 g/dL (ref 30.0–36.0)
MCV: 88.5 fL (ref 78.0–100.0)
Monocytes Absolute: 1 10*3/uL (ref 0.1–1.0)
Monocytes Relative: 11 % (ref 3–12)
NEUTROS ABS: 6 10*3/uL (ref 1.7–7.7)
Neutrophils Relative %: 68 % (ref 43–77)
PLATELETS: 553 10*3/uL — AB (ref 150–400)
RBC: 3.14 MIL/uL — AB (ref 4.22–5.81)
RDW: 15.3 % (ref 11.5–15.5)
WBC: 8.9 10*3/uL (ref 4.0–10.5)

## 2014-05-11 LAB — BASIC METABOLIC PANEL
BUN: 15 mg/dL (ref 6–23)
CHLORIDE: 99 meq/L (ref 96–112)
CO2: 26 meq/L (ref 19–32)
Calcium: 9.7 mg/dL (ref 8.4–10.5)
Creatinine, Ser: 0.7 mg/dL (ref 0.50–1.35)
GFR calc Af Amer: >90 mL/min (ref >90)
GFR calc non Af Amer: >90 mL/min (ref >90)
Glucose, Bld: 117 mg/dL — ABNORMAL HIGH (ref 70–99)
POTASSIUM: 3.5 meq/L — AB (ref 3.7–5.3)
SODIUM: 137 meq/L (ref 137–147)

## 2014-05-11 LAB — PROTIME-INR
INR: 1.42 (ref 0.00–1.49)
PROTHROMBIN TIME: 17 s — AB (ref 11.6–15.2)

## 2014-05-11 MED ORDER — IOHEXOL 350 MG/ML SOLN
80.0000 mL | Freq: Once | INTRAVENOUS | Status: AC | PRN
  Administered 2014-05-11: 80 mL via INTRAVENOUS

## 2014-05-11 NOTE — ED Notes (Addendum)
Per EMS pt is at [facility] rehab center for resolution of septic arthritis bilateral knees. Pt c/o intermediate chest pain x2days, SR on monitor/EKG with EMS, 20g SL to lt hand. Pt in no distress

## 2014-05-11 NOTE — ED Provider Notes (Signed)
CSN: [id number]     Arrival date & time 05/11/14  0154 History   First MD Initiated Contact with Patient 05/11/14 0215     Chief Complaint  Patient presents with  . Chest Pain     (Consider location/radiation/quality/duration/timing/severity/associated sxs/prior Treatment) Patient is a 73 y.o. male presenting with chest pain. The history is provided by the patient and a relative.  Chest Pain Pain location:  L chest Pain quality: sharp   Pain radiates to:  Does not radiate Pain radiates to the back: no   Pain severity:  Moderate Onset quality:  Sudden Duration: 2 to 3 seconds at a time. Timing:  Rare Progression:  Unchanged Chronicity:  Recurrent Context: at rest   Relieved by:  Nothing Worsened by:  Nothing tried Ineffective treatments:  None tried Associated symptoms: no abdominal pain, no back pain, no cough, no diaphoresis, no fever, no nausea, no numbness, no orthopnea, no palpitations, no syncope, not vomiting and no weakness   Risk factors: male sex and surgery     Past Medical History  Diagnosis Date  . Attention deficit disorder   . Diabetes mellitus without complication   . Hyperlipidemia   . Hypertension   . Blind left eye    Past Surgical History  Procedure Laterality Date  . Lipoma removal, right upper back    . Eye surgery Left     x3  . Tonsillectomy    . Knee arthroscopy Bilateral 03/29/2014    Procedure: RIGHT KNEE ARTHROSCOPY AND DEBRIDEMENT, LEFT KNEE ASPIRATION,  LEFT KNEE ARTHROSCOPY AND DEBRIDEMENT.;  Surgeon: Meredith Pel, MD;  Location: [place];  Service: Orthopedics;  Laterality: Bilateral;  RIGHT KNEE ARTHROSCOPY AND DEBRIDEMENT, LEFT KNEE ASPIRATION,  LEFT KNEE ARTHROSCOPY AND DEBRIDEMENT.  Marland Kitchen Knee arthrotomy Bilateral 04/26/2014    Procedure: KNEE ARTHROTOMY, SYNOVECTOMY, ANTIBIOTIC BEAD PLACEMENT.;  Surgeon: Meredith Pel, MD;  Location: [facility];  Service: Orthopedics;  Laterality: Bilateral;   Family History  Problem Relation Age of  Onset  . Heart attack Father   . Heart attack Sister   . Heart attack Brother    History  Substance Use Topics  . Smoking status: Former Research scientist (life sciences)  . Smokeless tobacco: Not on file  . Alcohol Use: No    Review of Systems  Constitutional: Negative for fever and diaphoresis.  Respiratory: Negative for cough and wheezing.   Cardiovascular: Positive for chest pain. Negative for palpitations, orthopnea and syncope.  Gastrointestinal: Negative for nausea, vomiting and abdominal pain.  Musculoskeletal: Negative for arthralgias, back pain, myalgias and neck pain.  Skin: Negative for rash.  Neurological: Negative for weakness and numbness.  All other systems reviewed and are negative.     Allergies  Review of patient's allergies indicates no known allergies.  Home Medications   Prior to Admission medications   Medication Sig Start Date End Date Taking? Authorizing Provider  baclofen (LIORESAL) 10 MG tablet Take 0.5 tablets (5 mg total) by mouth 3 (three) times daily as needed for muscle spasms. 05/02/14   Samuella Cota, MD  cefTRIAXone 2 g in dextrose 5 % 50 mL Inject 2 g into the vein daily. Ceftriaxone 2gm IV daily for 6 wk using 04/27/14 as day of 1 of 42 End date will be July 22nd. Follow up in ID clinic in mid July to evaluate improvement. 05/02/14   Samuella Cota, MD  ergocalciferol (VITAMIN D2) 50000 UNITS capsule Take 50,000 Units by mouth See admin instructions. Take 1 capsule twice weekly for  4 weeks (Mondays and Fridays)    Historical Provider, MD  GLUCERNA [facility]) LIQD Take 1 Can by mouth 3 (three) times daily between meals.    Historical Provider, MD  HYDROcodone-acetaminophen (NORCO) 10-325 MG per tablet Take 1 tablet by mouth every 4 (four) hours as needed. 04/29/14   Meredith Pel, MD  lactobacillus acidophilus (BACID) TABS tablet Take 1 tablet by mouth 2 (two) times daily. INDEFINITELY WHILE ON ANTIBIOTICS    Historical Provider, MD  lisinopril  (PRINIVIL,ZESTRIL) 20 MG tablet Take 20 mg by mouth daily.    Historical Provider, MD  loperamide (IMODIUM A-D) 2 MG tablet Take 2 mg by mouth 4 (four) times daily as needed for diarrhea or loose stools.    Historical Provider, MD  menthol-cetylpyridinium (CEPACOL) 3 MG lozenge Take 1 lozenge (3 mg total) by mouth as needed for sore throat. 04/05/14   Bobby Rumpf York, PA-C  metFORMIN (GLUCOPHAGE-XR) 500 MG 24 hr tablet Take 2,000 mg by mouth at bedtime.     Historical Provider, MD  rivaroxaban (XARELTO) 20 MG TABS tablet Take 1 tablet (20 mg total) by mouth daily with supper. Start taking 20 mg daily when the 21 days of 15 mg bid is complete 04/05/14   Melton Alar, PA-C  sertraline (ZOLOFT) 25 MG tablet Take 12.5-25 mg by mouth daily.    Historical Provider, MD  simvastatin (ZOCOR) 20 MG tablet Take 20 mg by mouth daily.    Historical Provider, MD   BP 124/58  Pulse 106  Temp(Src) 98.3 F (36.8 C) (Oral)  Resp 22  Ht 5\' 8"  (1.727 m)  Wt 179 lb (81.194 kg)  BMI 27.22 kg/m2  SpO2 97% Physical Exam  Constitutional: He is oriented to person, place, and time. He appears well-developed and well-nourished. No distress.  HENT:  Head: Normocephalic and atraumatic.  Mouth/Throat: Oropharynx is clear and moist.  Eyes: Conjunctivae are normal. Pupils are equal, round, and reactive to light.  Neck: Normal range of motion. Neck supple.  Cardiovascular: Normal rate, regular rhythm and intact distal pulses.   Pulmonary/Chest: Effort normal and breath sounds normal. He has no wheezes. He has no rales.  Abdominal: Soft. Bowel sounds are normal. There is no tenderness. There is no rebound and no guarding.  Musculoskeletal: He exhibits no edema.  Incision CDI B feet neurovascularly intact 2+ dorsalis pedis  Neurological: He is alert and oriented to person, place, and time.  Skin: Skin is warm and dry.  No splinter hemorrhage no Janeway lesions no Osler's nodes  Psychiatric: He has a normal mood and  affect.    ED Course  Procedures (including critical care time) Labs Review Labs Reviewed  CBC WITH DIFFERENTIAL - Abnormal; Notable for the following:    RBC 3.14 (*)    Hemoglobin 8.8 (*)    HCT 27.8 (*)    Platelets 553 (*)    All other components within normal limits  BASIC METABOLIC PANEL - Abnormal; Notable for the following:    Potassium 3.5 (*)    Glucose, Bld 117 (*)    All other components within normal limits  PROTIME-INR - Abnormal; Notable for the following:    Prothrombin Time 17.0 (*)    All other components within normal limits  TROPONIN I    Imaging Review Dg Chest 2 View  05/11/2014   CLINICAL DATA:  Chest pain for 2 days.  EXAM: CHEST  2 VIEW  COMPARISON:  03/29/2014  FINDINGS: Interval insertion of right PICC catheter.  Tip overlies the cavoatrial junction on the PA view but appears lower on the lateral view. This is probably due to respiratory differences. No pneumothorax. The heart size and mediastinal contours are within normal limits. Both lungs are clear. The visualized skeletal structures are unremarkable.  IMPRESSION: No active cardiopulmonary disease. Right PICC catheter tip overlies the cavoatrial junction.   Electronically Signed   By: Lucienne Capers M.D.   On: 05/11/2014 02:55     EKG Interpretation   Date/Time:  Wednesday May 11 2014 02:14:41 EDT Ventricular Rate:  96 PR Interval:  158 QRS Duration: 82 QT Interval:  336 QTC Calculation: 424 R Axis:   51 Text Interpretation:  Normal sinus rhythm Confirmed by [facility]  MD,  APRIL ([id number]) on 05/11/2014 3:42:31 AM      MDM   Final diagnoses:  None    Normal ekg and troponin symptoms very atypical for cardiac chest pain.  Patient had a very low risk nuclear stress test in April 2015 for similar.  Patient has no PE seen on technically adequate study.  Patient is on Xarelto.  Continue same.  Suspect these episodes are muscle spasms related to the fact that patient is somewhat  deconditioned at this time but is actively undergoing rehab post knee surgery.  Regarding anemia, Hgb is stable.  Patient is not on oral iron.  Follow up with Dr. Sabra Heck regarding this.  Return for any new or concerning symptoms.      Carlisle Beers, MD 05/11/14 [phone]

## 2014-05-13 DIAGNOSIS — M25569 Pain in unspecified knee: Secondary | ICD-10-CM | POA: Diagnosis not present

## 2014-05-13 DIAGNOSIS — R262 Difficulty in walking, not elsewhere classified: Secondary | ICD-10-CM | POA: Diagnosis not present

## 2014-05-16 DIAGNOSIS — M25569 Pain in unspecified knee: Secondary | ICD-10-CM | POA: Diagnosis not present

## 2014-05-18 DIAGNOSIS — M25569 Pain in unspecified knee: Secondary | ICD-10-CM | POA: Diagnosis not present

## 2014-05-23 DIAGNOSIS — M25569 Pain in unspecified knee: Secondary | ICD-10-CM | POA: Diagnosis not present

## 2014-05-23 DIAGNOSIS — R609 Edema, unspecified: Secondary | ICD-10-CM | POA: Diagnosis not present

## 2014-05-23 DIAGNOSIS — M79609 Pain in unspecified limb: Secondary | ICD-10-CM | POA: Diagnosis not present

## 2014-05-25 DIAGNOSIS — F329 Major depressive disorder, single episode, unspecified: Secondary | ICD-10-CM | POA: Diagnosis not present

## 2014-05-26 DIAGNOSIS — M25569 Pain in unspecified knee: Secondary | ICD-10-CM | POA: Diagnosis not present

## 2014-05-26 DIAGNOSIS — R262 Difficulty in walking, not elsewhere classified: Secondary | ICD-10-CM | POA: Diagnosis not present

## 2014-05-30 ENCOUNTER — Telehealth: Payer: Self-pay | Admitting: *Deleted

## 2014-05-30 DIAGNOSIS — R262 Difficulty in walking, not elsewhere classified: Secondary | ICD-10-CM | POA: Diagnosis not present

## 2014-05-30 DIAGNOSIS — M25569 Pain in unspecified knee: Secondary | ICD-10-CM | POA: Diagnosis not present

## 2014-05-30 NOTE — Telephone Encounter (Signed)
[facility] appt is 06/02/14 @ 2 PM w/ Dr.Snider, IV meds will be decided at that time

## 2014-06-01 DIAGNOSIS — M25569 Pain in unspecified knee: Secondary | ICD-10-CM | POA: Diagnosis not present

## 2014-06-01 DIAGNOSIS — R262 Difficulty in walking, not elsewhere classified: Secondary | ICD-10-CM | POA: Diagnosis not present

## 2014-06-02 ENCOUNTER — Ambulatory Visit (INDEPENDENT_AMBULATORY_CARE_PROVIDER_SITE_OTHER): Payer: Medicare Other | Admitting: Internal Medicine

## 2014-06-02 ENCOUNTER — Encounter: Payer: Self-pay | Admitting: Internal Medicine

## 2014-06-02 VITALS — BP 120/70 | HR 90 | Temp 98.1°F | Ht 68.0 in | Wt 163.0 lb

## 2014-06-02 DIAGNOSIS — M009 Pyogenic arthritis, unspecified: Secondary | ICD-10-CM | POA: Diagnosis not present

## 2014-06-02 NOTE — Progress Notes (Signed)
Subjective:    Patient ID: [name], male    DOB: 11/18/1941, 73 y.o.   MRN: [id number]  HPI [name] is a 73 y.o. male R knee pain that developed in late April.He underwent aspiration at that time which revealed crystals c/w gout and he received a steroid injection into his knee. He improved but then developed worsening swelling and pain of his knee. He had repeat arthrocentesis with increased WBC but no crystals, thus developed bilateral septic arthritis. He underwent bilateral I x D on 5/12. Cultures from Right knee did show strep viridans (PCN S 0.094)but left cx no growth todate. He had complication of developing bilaterally DVt thus he was discharged on ceftriaxone for 6 wk plus xarelto. He followed up with Dr. Marlou Sa on 6/8 who repeated aspiration of both knees that still showed persistently elevated leukocytosis of 50K. (unclear if sent for culture). He was admitted due to failing IV therapy requiring repeat surgical debridement. On 6/9, he underwent open arthrotomy-synevectomy with antibiotics beads bilaterally. OR cx were NGTD. He was discharged on 6 wk of ceftriaxone 2gm IV daily. He is currently on day 36 of 42 days. Roughly 2 wk ago he had acute swelling of right knee and leg, had been evaluated by Dr .Marlou Sa thought it was due to over activity. Now swelling improved. Still has bilateral knee effusion well healed surgical scars  He denies fever chills, n/v/diarrhea/rash from antibiotics No proximal swelling or erythema  Current Outpatient Prescriptions on File Prior to Visit  Medication Sig Dispense Refill  . baclofen (LIORESAL) 10 MG tablet Take 0.5 tablets (5 mg total) by mouth 3 (three) times daily as needed for muscle spasms.  20 each  0  . cefTRIAXone 2 g in dextrose 5 % 50 mL Inject 2 g into the vein daily. Ceftriaxone 2gm IV daily for 6 wk using 04/27/14 as day of 1 of 42 End date will be July 22nd. Follow up in ID clinic in mid July to evaluate improvement.      .  ergocalciferol (VITAMIN D2) 50000 UNITS capsule Take 50,000 Units by mouth See admin instructions. Take 1 capsule twice weekly for 4 weeks (Mondays and Fridays)      . GLUCERNA (GLUCERNA) LIQD Take 1 Can by mouth 3 (three) times daily between meals.      Marland Kitchen HYDROcodone-acetaminophen (NORCO) 10-325 MG per tablet Take 1 tablet by mouth every 4 (four) hours as needed.  60 tablet  0  . lactobacillus acidophilus (BACID) TABS tablet Take 1 tablet by mouth 2 (two) times daily. INDEFINITELY WHILE ON ANTIBIOTICS      . lisinopril (PRINIVIL,ZESTRIL) 20 MG tablet Take 20 mg by mouth daily.      Marland Kitchen loperamide (IMODIUM A-D) 2 MG tablet Take 2 mg by mouth 4 (four) times daily as needed for diarrhea or loose stools.      . menthol-cetylpyridinium (CEPACOL) 3 MG lozenge Take 1 lozenge (3 mg total) by mouth as needed for sore throat.  100 tablet  12  . metFORMIN (GLUCOPHAGE-XR) 500 MG 24 hr tablet Take 2,000 mg by mouth at bedtime.       . rivaroxaban (XARELTO) 20 MG TABS tablet Take 1 tablet (20 mg total) by mouth daily with supper. Start taking 20 mg daily when the 21 days of 15 mg bid is complete  30 tablet  2  . sertraline (ZOLOFT) 25 MG tablet Take 12.5-25 mg by mouth daily.      . simvastatin (ZOCOR)  20 MG tablet Take 20 mg by mouth daily.       No current facility-administered medications on file prior to visit.   Active Ambulatory Problems    Diagnosis Date Noted  . Essential hypertension 03/01/2014  . Chest pain 03/01/2014  . Diabetes mellitus type II, controlled 03/01/2014  . Hyperlipidemia 03/01/2014  . Bacterial infection of knee joint 03/29/2014  . DVT of lower extremity, bilateral 04/01/2014  . Septic joint 04/25/2014  . Protein-calorie malnutrition, severe 04/28/2014  . Acute blood loss anemia 04/28/2014   Resolved Ambulatory Problems    Diagnosis Date Noted  . No Resolved Ambulatory Problems   Past Medical History  Diagnosis Date  . Attention deficit disorder   . Diabetes mellitus  without complication   . Hypertension   . Blind left eye       Review of Systems 10 point ros reviewed. Positive pertinents listed above    Objective:   Physical Exam  BP 120/70  Pulse 90  Temp(Src) 98.1 F (36.7 C) (Oral)  Ht 5\' 8"  (1.727 m)  Wt 163 lb (73.936 kg)  BMI 24.79 kg/m2 Gen: a xo by 2 in NAD Ext: bilateral knee effusion R>L. Contracture R> L Skin: no erythema      Assessment & Plan:  - will ask for them check sed rate nad crp next week on day 42 of antibiotics. Will switch to amoxicillin 500mg  TID to see back in 5 wk to see if need to extend further.

## 2014-06-03 DIAGNOSIS — M25569 Pain in unspecified knee: Secondary | ICD-10-CM | POA: Diagnosis not present

## 2014-06-06 DIAGNOSIS — M25569 Pain in unspecified knee: Secondary | ICD-10-CM | POA: Diagnosis not present

## 2014-06-09 ENCOUNTER — Telehealth: Payer: Self-pay | Admitting: *Deleted

## 2014-06-09 DIAGNOSIS — R5381 Other malaise: Secondary | ICD-10-CM | POA: Diagnosis not present

## 2014-06-09 DIAGNOSIS — I82409 Acute embolism and thrombosis of unspecified deep veins of unspecified lower extremity: Secondary | ICD-10-CM | POA: Diagnosis not present

## 2014-06-09 DIAGNOSIS — I10 Essential (primary) hypertension: Secondary | ICD-10-CM | POA: Diagnosis not present

## 2014-06-09 NOTE — Telephone Encounter (Signed)
Verbal order per Dr. Baxter Flattery given to charge nurse, Vinnie Level, at patient's skilled nursing facility to continue the [facility], do a sed rate and CRP today and fax the results to this clinic. Continue the weekly CBC and CMP lab draws. Myrtis Hopping

## 2014-06-18 DIAGNOSIS — Z5189 Encounter for other specified aftercare: Secondary | ICD-10-CM | POA: Diagnosis not present

## 2014-06-18 DIAGNOSIS — M009 Pyogenic arthritis, unspecified: Secondary | ICD-10-CM | POA: Diagnosis not present

## 2014-06-18 DIAGNOSIS — E119 Type 2 diabetes mellitus without complications: Secondary | ICD-10-CM | POA: Diagnosis not present

## 2014-06-18 DIAGNOSIS — I82409 Acute embolism and thrombosis of unspecified deep veins of unspecified lower extremity: Secondary | ICD-10-CM | POA: Diagnosis not present

## 2014-06-18 DIAGNOSIS — F329 Major depressive disorder, single episode, unspecified: Secondary | ICD-10-CM | POA: Diagnosis not present

## 2014-06-18 DIAGNOSIS — E41 Nutritional marasmus: Secondary | ICD-10-CM | POA: Diagnosis not present

## 2014-06-18 DIAGNOSIS — F988 Other specified behavioral and emotional disorders with onset usually occurring in childhood and adolescence: Secondary | ICD-10-CM | POA: Diagnosis not present

## 2014-06-18 DIAGNOSIS — H5440 Blindness, one eye, unspecified eye: Secondary | ICD-10-CM | POA: Diagnosis not present

## 2014-06-18 DIAGNOSIS — I10 Essential (primary) hypertension: Secondary | ICD-10-CM | POA: Diagnosis not present

## 2014-06-18 DIAGNOSIS — Z4789 Encounter for other orthopedic aftercare: Secondary | ICD-10-CM | POA: Diagnosis not present

## 2014-06-18 DIAGNOSIS — E785 Hyperlipidemia, unspecified: Secondary | ICD-10-CM | POA: Diagnosis not present

## 2014-07-07 DIAGNOSIS — M25569 Pain in unspecified knee: Secondary | ICD-10-CM | POA: Diagnosis not present

## 2014-07-11 ENCOUNTER — Ambulatory Visit (INDEPENDENT_AMBULATORY_CARE_PROVIDER_SITE_OTHER): Payer: Medicare Other | Admitting: Internal Medicine

## 2014-07-11 ENCOUNTER — Encounter: Payer: Self-pay | Admitting: Internal Medicine

## 2014-07-11 VITALS — BP 146/72 | HR 125 | Temp 97.3°F | Wt 165.0 lb

## 2014-07-11 DIAGNOSIS — M009 Pyogenic arthritis, unspecified: Secondary | ICD-10-CM

## 2014-07-11 DIAGNOSIS — M25569 Pain in unspecified knee: Secondary | ICD-10-CM | POA: Diagnosis not present

## 2014-07-11 NOTE — Progress Notes (Signed)
Subjective:    Patient ID: [name], male    DOB: Oct 06, 1941, 73 y.o.   MRN: [id number]  HPI [name] is a 73 y.o. male R knee pain that developed in late April.He underwent aspiration at that time which revealed crystals c/w gout and he received a steroid injection into his knee. He improved but then developed worsening swelling and pain of his knee. He had repeat arthrocentesis with increased WBC but no crystals, thus developed bilateral septic arthritis. He underwent bilateral I x D on 5/12. Cultures from Right knee did show strep viridans (PCN S 0.094)but left cx no growth todate. He had complication of developing bilaterally DVt thus he was discharged on ceftriaxone for 6 wk plus xarelto. He followed up with Dr. Marlou Sa on 6/8 who repeated aspiration of both knees that still showed persistently elevated leukocytosis of 50K. (unclear if sent for culture). He was admitted due to failing IV therapy requiring repeat surgical debridement. On 6/9, he underwent open arthrotomy-synevectomy with antibiotics beads bilaterally. OR cx were NGTD. He was discharged on 6 wk of ceftriaxone 2gm IV daily and transitioned to amoxicillin 500mg  TID for hte past 4 weeks. He has been participating in physical therapy which has been helping with his mobility. Has some swelling post physical therapy R>L. He feels that he is at 80% of his baseline. He is starting to increase weight that he had lost during this past spring/summer.  Current Outpatient Prescriptions on File Prior to Visit  Medication Sig Dispense Refill  . baclofen (LIORESAL) 10 MG tablet Take 0.5 tablets (5 mg total) by mouth 3 (three) times daily as needed for muscle spasms.  20 each  0  . cefTRIAXone 2 g in dextrose 5 % 50 mL Inject 2 g into the vein daily. Ceftriaxone 2gm IV daily for 6 wk using 04/27/14 as day of 1 of 42 End date will be July 22nd. Follow up in ID clinic in mid July to evaluate improvement.      . ergocalciferol (VITAMIN D2)  50000 UNITS capsule Take 50,000 Units by mouth See admin instructions. Take 1 capsule twice weekly for 4 weeks (Mondays and Fridays)      . GLUCERNA (GLUCERNA) LIQD Take 1 Can by mouth 3 (three) times daily between meals.      Marland Kitchen HYDROcodone-acetaminophen (NORCO) 10-325 MG per tablet Take 1 tablet by mouth every 4 (four) hours as needed.  60 tablet  0  . lactobacillus acidophilus (BACID) TABS tablet Take 1 tablet by mouth 2 (two) times daily. INDEFINITELY WHILE ON ANTIBIOTICS      . lisinopril (PRINIVIL,ZESTRIL) 20 MG tablet Take 20 mg by mouth daily.      Marland Kitchen loperamide (IMODIUM A-D) 2 MG tablet Take 2 mg by mouth 4 (four) times daily as needed for diarrhea or loose stools.      . menthol-cetylpyridinium (CEPACOL) 3 MG lozenge Take 1 lozenge (3 mg total) by mouth as needed for sore throat.  100 tablet  12  . metFORMIN (GLUCOPHAGE-XR) 500 MG 24 hr tablet Take 2,000 mg by mouth at bedtime.       . metoprolol succinate (TOPROL-XL) 25 MG 24 hr tablet Take 25 mg by mouth 2 (two) times daily.      . mirtazapine (REMERON) 15 MG tablet Take 15 mg by mouth at bedtime.      . rivaroxaban (XARELTO) 20 MG TABS tablet Take 1 tablet (20 mg total) by mouth daily with supper. Start taking 20 mg  daily when the 21 days of 15 mg bid is complete  30 tablet  2  . sertraline (ZOLOFT) 25 MG tablet Take 12.5-25 mg by mouth daily.      . simvastatin (ZOCOR) 20 MG tablet Take 20 mg by mouth daily.       No current facility-administered medications on file prior to visit.   Active Ambulatory Problems    Diagnosis Date Noted  . Essential hypertension 03/01/2014  . Chest pain 03/01/2014  . Diabetes mellitus type II, controlled 03/01/2014  . Hyperlipidemia 03/01/2014  . Bacterial infection of knee joint 03/29/2014  . DVT of lower extremity, bilateral 04/01/2014  . Septic joint 04/25/2014  . Protein-calorie malnutrition, severe 04/28/2014  . Acute blood loss anemia 04/28/2014   Resolved Ambulatory Problems    Diagnosis  Date Noted  . No Resolved Ambulatory Problems   Past Medical History  Diagnosis Date  . Attention deficit disorder   . Diabetes mellitus without complication   . Hypertension   . Blind left eye         Review of Systems + occ knee stiffness. Otherwise 10 point ros is negative    Objective:   Physical Exam BP 146/72  Pulse 125  Temp(Src) 97.3 F (36.3 C) (Oral)  Wt 165 lb (74.844 kg) Physical Exam  Constitutional: He is oriented to person, place, and time. He appears well-developed and well-nourished. No distress.  HENT:  Mouth/Throat: Oropharynx is clear and moist. No oropharyngeal exudate.  Ext: left knee slight effusion Neurological: He is alert and oriented to person, place, and time.  Skin: Skin is warm and dry. No rash noted. No erythema.  Psychiatric: He has a normal mood and affect. His behavior is normal.   Lab Results  Component Value Date   ESRSEDRATE 140* 04/29/2014   Lab Results  Component Value Date   CRP 16.2* 04/29/2014      Assessment & Plan:  Septic arthritis with strep viridans = has been on prolonged antibiotics, finished amoxicillin last night. We will check his inflammatory markers today to ensure they have normalized.  rtc prn

## 2014-07-12 LAB — C-REACTIVE PROTEIN

## 2014-07-12 LAB — SEDIMENTATION RATE: Sed Rate: 1 mm/hr (ref 0–16)

## 2014-07-13 DIAGNOSIS — M25569 Pain in unspecified knee: Secondary | ICD-10-CM | POA: Diagnosis not present

## 2014-07-15 DIAGNOSIS — M25569 Pain in unspecified knee: Secondary | ICD-10-CM | POA: Diagnosis not present

## 2014-07-18 DIAGNOSIS — M25569 Pain in unspecified knee: Secondary | ICD-10-CM | POA: Diagnosis not present

## 2014-07-20 DIAGNOSIS — M25569 Pain in unspecified knee: Secondary | ICD-10-CM | POA: Diagnosis not present

## 2014-07-21 DIAGNOSIS — L84 Corns and callosities: Secondary | ICD-10-CM | POA: Diagnosis not present

## 2014-07-21 DIAGNOSIS — L608 Other nail disorders: Secondary | ICD-10-CM | POA: Diagnosis not present

## 2014-07-21 DIAGNOSIS — E119 Type 2 diabetes mellitus without complications: Secondary | ICD-10-CM | POA: Diagnosis not present

## 2014-07-26 DIAGNOSIS — E119 Type 2 diabetes mellitus without complications: Secondary | ICD-10-CM | POA: Diagnosis not present

## 2014-07-26 DIAGNOSIS — Z86718 Personal history of other venous thrombosis and embolism: Secondary | ICD-10-CM | POA: Diagnosis not present

## 2014-07-26 DIAGNOSIS — E7800 Pure hypercholesterolemia, unspecified: Secondary | ICD-10-CM | POA: Diagnosis not present

## 2014-07-26 DIAGNOSIS — I10 Essential (primary) hypertension: Secondary | ICD-10-CM | POA: Diagnosis not present

## 2014-07-26 DIAGNOSIS — R634 Abnormal weight loss: Secondary | ICD-10-CM | POA: Diagnosis not present

## 2014-07-26 DIAGNOSIS — Z23 Encounter for immunization: Secondary | ICD-10-CM | POA: Diagnosis not present

## 2014-07-27 DIAGNOSIS — M25569 Pain in unspecified knee: Secondary | ICD-10-CM | POA: Diagnosis not present

## 2014-07-29 DIAGNOSIS — M25569 Pain in unspecified knee: Secondary | ICD-10-CM | POA: Diagnosis not present

## 2014-08-01 DIAGNOSIS — M25569 Pain in unspecified knee: Secondary | ICD-10-CM | POA: Diagnosis not present

## 2014-08-03 DIAGNOSIS — M25569 Pain in unspecified knee: Secondary | ICD-10-CM | POA: Diagnosis not present

## 2014-08-08 DIAGNOSIS — M25569 Pain in unspecified knee: Secondary | ICD-10-CM | POA: Diagnosis not present

## 2014-08-10 DIAGNOSIS — M25469 Effusion, unspecified knee: Secondary | ICD-10-CM | POA: Diagnosis not present

## 2014-08-10 DIAGNOSIS — M90869 Osteopathy in diseases classified elsewhere, unspecified lower leg: Secondary | ICD-10-CM | POA: Diagnosis not present

## 2014-08-10 DIAGNOSIS — M23329 Other meniscus derangements, posterior horn of medial meniscus, unspecified knee: Secondary | ICD-10-CM | POA: Diagnosis not present

## 2014-08-10 DIAGNOSIS — M25569 Pain in unspecified knee: Secondary | ICD-10-CM | POA: Diagnosis not present

## 2014-08-10 DIAGNOSIS — M1710 Unilateral primary osteoarthritis, unspecified knee: Secondary | ICD-10-CM | POA: Diagnosis not present

## 2014-08-10 DIAGNOSIS — B998 Other infectious disease: Secondary | ICD-10-CM | POA: Diagnosis not present

## 2014-08-16 DIAGNOSIS — M25569 Pain in unspecified knee: Secondary | ICD-10-CM | POA: Diagnosis not present

## 2014-08-22 DIAGNOSIS — M25561 Pain in right knee: Secondary | ICD-10-CM | POA: Diagnosis not present

## 2014-09-22 DIAGNOSIS — B351 Tinea unguium: Secondary | ICD-10-CM | POA: Diagnosis not present

## 2014-09-22 DIAGNOSIS — L859 Epidermal thickening, unspecified: Secondary | ICD-10-CM | POA: Diagnosis not present

## 2014-09-22 DIAGNOSIS — E1140 Type 2 diabetes mellitus with diabetic neuropathy, unspecified: Secondary | ICD-10-CM | POA: Diagnosis not present

## 2014-10-07 DIAGNOSIS — H524 Presbyopia: Secondary | ICD-10-CM | POA: Diagnosis not present

## 2014-10-07 DIAGNOSIS — E119 Type 2 diabetes mellitus without complications: Secondary | ICD-10-CM | POA: Diagnosis not present

## 2014-11-23 DIAGNOSIS — E1140 Type 2 diabetes mellitus with diabetic neuropathy, unspecified: Secondary | ICD-10-CM | POA: Diagnosis not present

## 2014-11-23 DIAGNOSIS — L602 Onychogryphosis: Secondary | ICD-10-CM | POA: Diagnosis not present

## 2014-12-28 DIAGNOSIS — M25561 Pain in right knee: Secondary | ICD-10-CM | POA: Diagnosis not present

## 2015-01-23 DIAGNOSIS — E119 Type 2 diabetes mellitus without complications: Secondary | ICD-10-CM | POA: Diagnosis not present

## 2015-01-23 DIAGNOSIS — Z87891 Personal history of nicotine dependence: Secondary | ICD-10-CM | POA: Diagnosis not present

## 2015-01-23 DIAGNOSIS — I10 Essential (primary) hypertension: Secondary | ICD-10-CM | POA: Diagnosis not present

## 2015-01-23 DIAGNOSIS — Z86718 Personal history of other venous thrombosis and embolism: Secondary | ICD-10-CM | POA: Diagnosis not present

## 2015-01-23 DIAGNOSIS — E780 Pure hypercholesterolemia: Secondary | ICD-10-CM | POA: Diagnosis not present

## 2015-01-24 DIAGNOSIS — L602 Onychogryphosis: Secondary | ICD-10-CM | POA: Diagnosis not present

## 2015-01-24 DIAGNOSIS — E1140 Type 2 diabetes mellitus with diabetic neuropathy, unspecified: Secondary | ICD-10-CM | POA: Diagnosis not present

## 2015-03-29 DIAGNOSIS — L84 Corns and callosities: Secondary | ICD-10-CM | POA: Diagnosis not present

## 2015-03-29 DIAGNOSIS — E1151 Type 2 diabetes mellitus with diabetic peripheral angiopathy without gangrene: Secondary | ICD-10-CM | POA: Diagnosis not present

## 2015-03-29 DIAGNOSIS — L602 Onychogryphosis: Secondary | ICD-10-CM | POA: Diagnosis not present

## 2015-03-29 DIAGNOSIS — M2041 Other hammer toe(s) (acquired), right foot: Secondary | ICD-10-CM | POA: Diagnosis not present

## 2015-04-07 DIAGNOSIS — E119 Type 2 diabetes mellitus without complications: Secondary | ICD-10-CM | POA: Diagnosis not present

## 2015-04-07 DIAGNOSIS — H40052 Ocular hypertension, left eye: Secondary | ICD-10-CM | POA: Diagnosis not present

## 2015-04-07 DIAGNOSIS — H2511 Age-related nuclear cataract, right eye: Secondary | ICD-10-CM | POA: Diagnosis not present

## 2015-04-07 DIAGNOSIS — T86840 Corneal transplant rejection: Secondary | ICD-10-CM | POA: Diagnosis not present

## 2015-04-27 ENCOUNTER — Ambulatory Visit
Admission: RE | Admit: 2015-04-27 | Discharge: 2015-04-27 | Disposition: A | Discharge status: Home / Self Care | Payer: Medicare Other | Source: Ambulatory Visit | Attending: Family Medicine | Admitting: Family Medicine

## 2015-04-27 ENCOUNTER — Other Ambulatory Visit: Payer: Self-pay | Admitting: Family Medicine

## 2015-04-27 DIAGNOSIS — M25511 Pain in right shoulder: Secondary | ICD-10-CM | POA: Diagnosis not present

## 2015-04-27 DIAGNOSIS — R202 Paresthesia of skin: Secondary | ICD-10-CM | POA: Diagnosis not present

## 2015-04-27 DIAGNOSIS — M19011 Primary osteoarthritis, right shoulder: Secondary | ICD-10-CM | POA: Diagnosis not present

## 2015-04-27 DIAGNOSIS — M5032 Other cervical disc degeneration, mid-cervical region: Secondary | ICD-10-CM | POA: Diagnosis not present

## 2015-04-27 DIAGNOSIS — M47812 Spondylosis without myelopathy or radiculopathy, cervical region: Secondary | ICD-10-CM | POA: Diagnosis not present

## 2015-04-28 ENCOUNTER — Other Ambulatory Visit: Payer: Self-pay | Admitting: Family Medicine

## 2015-04-28 DIAGNOSIS — R9389 Abnormal findings on diagnostic imaging of other specified body structures: Secondary | ICD-10-CM

## 2015-05-17 ENCOUNTER — Ambulatory Visit
Admission: RE | Admit: 2015-05-17 | Discharge: 2015-05-17 | Disposition: A | Discharge status: Home / Self Care | Payer: Medicare Other | Source: Ambulatory Visit | Attending: Family Medicine | Admitting: Family Medicine

## 2015-05-17 DIAGNOSIS — M25411 Effusion, right shoulder: Secondary | ICD-10-CM | POA: Diagnosis not present

## 2015-05-17 DIAGNOSIS — R9389 Abnormal findings on diagnostic imaging of other specified body structures: Secondary | ICD-10-CM

## 2015-05-17 DIAGNOSIS — M19011 Primary osteoarthritis, right shoulder: Secondary | ICD-10-CM | POA: Diagnosis not present

## 2015-05-17 DIAGNOSIS — M75101 Unspecified rotator cuff tear or rupture of right shoulder, not specified as traumatic: Secondary | ICD-10-CM | POA: Diagnosis not present

## 2015-06-09 DIAGNOSIS — L602 Onychogryphosis: Secondary | ICD-10-CM | POA: Diagnosis not present

## 2015-06-09 DIAGNOSIS — L84 Corns and callosities: Secondary | ICD-10-CM | POA: Diagnosis not present

## 2015-06-09 DIAGNOSIS — E1151 Type 2 diabetes mellitus with diabetic peripheral angiopathy without gangrene: Secondary | ICD-10-CM | POA: Diagnosis not present

## 2015-07-31 DIAGNOSIS — E780 Pure hypercholesterolemia: Secondary | ICD-10-CM | POA: Diagnosis not present

## 2015-07-31 DIAGNOSIS — E119 Type 2 diabetes mellitus without complications: Secondary | ICD-10-CM | POA: Diagnosis not present

## 2015-07-31 DIAGNOSIS — Z23 Encounter for immunization: Secondary | ICD-10-CM | POA: Diagnosis not present

## 2015-07-31 DIAGNOSIS — I10 Essential (primary) hypertension: Secondary | ICD-10-CM | POA: Diagnosis not present

## 2015-07-31 DIAGNOSIS — R202 Paresthesia of skin: Secondary | ICD-10-CM | POA: Diagnosis not present

## 2015-08-10 DIAGNOSIS — E1140 Type 2 diabetes mellitus with diabetic neuropathy, unspecified: Secondary | ICD-10-CM | POA: Diagnosis not present

## 2015-08-10 DIAGNOSIS — L602 Onychogryphosis: Secondary | ICD-10-CM | POA: Diagnosis not present

## 2015-10-10 DIAGNOSIS — H524 Presbyopia: Secondary | ICD-10-CM | POA: Diagnosis not present

## 2015-10-10 DIAGNOSIS — H40052 Ocular hypertension, left eye: Secondary | ICD-10-CM | POA: Diagnosis not present

## 2015-10-10 DIAGNOSIS — H25811 Combined forms of age-related cataract, right eye: Secondary | ICD-10-CM | POA: Diagnosis not present

## 2015-10-10 DIAGNOSIS — T86840 Corneal transplant rejection: Secondary | ICD-10-CM | POA: Diagnosis not present

## 2015-10-10 DIAGNOSIS — E119 Type 2 diabetes mellitus without complications: Secondary | ICD-10-CM | POA: Diagnosis not present

## 2015-10-14 ENCOUNTER — Other Ambulatory Visit: Payer: Self-pay

## 2015-10-16 DIAGNOSIS — I70293 Other atherosclerosis of native arteries of extremities, bilateral legs: Secondary | ICD-10-CM | POA: Diagnosis not present

## 2015-10-16 DIAGNOSIS — L602 Onychogryphosis: Secondary | ICD-10-CM | POA: Diagnosis not present

## 2015-10-16 DIAGNOSIS — L84 Corns and callosities: Secondary | ICD-10-CM | POA: Diagnosis not present

## 2015-10-16 DIAGNOSIS — E1351 Other specified diabetes mellitus with diabetic peripheral angiopathy without gangrene: Secondary | ICD-10-CM | POA: Diagnosis not present

## 2015-10-25 DIAGNOSIS — R202 Paresthesia of skin: Secondary | ICD-10-CM | POA: Diagnosis not present

## 2015-11-30 ENCOUNTER — Ambulatory Visit (INDEPENDENT_AMBULATORY_CARE_PROVIDER_SITE_OTHER): Payer: Self-pay | Admitting: Neurology

## 2015-11-30 ENCOUNTER — Encounter (INDEPENDENT_AMBULATORY_CARE_PROVIDER_SITE_OTHER): Payer: Self-pay

## 2015-11-30 ENCOUNTER — Ambulatory Visit (INDEPENDENT_AMBULATORY_CARE_PROVIDER_SITE_OTHER): Payer: Medicare Other | Admitting: Neurology

## 2015-11-30 DIAGNOSIS — G5602 Carpal tunnel syndrome, left upper limb: Secondary | ICD-10-CM

## 2015-11-30 DIAGNOSIS — G5601 Carpal tunnel syndrome, right upper limb: Secondary | ICD-10-CM | POA: Diagnosis not present

## 2015-11-30 DIAGNOSIS — G5603 Carpal tunnel syndrome, bilateral upper limbs: Secondary | ICD-10-CM

## 2015-11-30 DIAGNOSIS — Z0289 Encounter for other administrative examinations: Secondary | ICD-10-CM

## 2015-11-30 NOTE — Progress Notes (Signed)
See procedure note.

## 2015-11-30 NOTE — Progress Notes (Signed)
  [facility]    Provider:  Dr Jaynee Eagles Referring Provider: Kathyrn Lass, MD Primary Care Physician:  Tawanna Solo, MD  History:  [name] is a 75 y.o. male here as a referral from Dr. Sabra Heck for hand pain that started 3-4 months ago. Tips of the fingers have tingling and numbness. Started in the right and now in the left. Wakes him at night with pain. He drops things and can't open jars.   Summary:   Nerve Conduction Studies were performed on the bilateral upper extremities.  The right median APB motor nerve showed prolonged distal onset latency (9.1 ms, N<4.0). The right Median 2nd Digit sensory nerve showed no response F Wave studies indicate that the right Median F wave has delayed latency(35.2, N<79ms).  The left median APB motor nerve showed prolonged distal onset latency (9.4 ms, N<4.0). The left Median 2nd Digit sensory nerve showed no response F Wave studies indicate that the left Median F wave has delayed latency(35.8, N<52ms).   Bilateral Ulnar ADM motor nerves were within normal limits. F Wave studies indicate that the bilateral Ulnar F waves have normal latencies  The bilateralUlnar 5th digit sensory nerves were within normal limits.   EMG needle study of selected bilateral upper extremity muscles was performed. The right Opponens Pollicis muscle showed increased spontaneous activity (+3 positive waves) and the left Opponens Pollicis muscle showed increased spontaneous activity (+3 positive waves), decreased motor unit recruitment and polyphasic motor units.  The following muscles were normal bilaterally: Deltoid, Triceps, Pronator Teres, First Dorsal Interosseous.  Conclusion: This is an abnormal study. 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 No suggestion of polyneuropathy or radiculopathy. Clinical correlation and referral to hand surgeon recommended.   Sarina Ill, MD  [facility] [address] [place], [place] [address]  Phone [phone] Fax [phone]

## 2015-11-30 NOTE — Procedures (Signed)
  [facility]    Provider:  Dr Jaynee Eagles Referring Provider: Kathyrn Lass, MD Primary Care Physician:  Tawanna Solo, MD  History:  [name] is a 75 y.o. male here as a referral from Dr. Sabra Heck for hand pain that started 3-4 months ago. Tips of the fingers have tingling and numbness. Started in the right and now in the left. Wakes him at night with pain. He drops things and can't open jars.   Summary:   Nerve Conduction Studies were performed on the bilateral upper extremities.  The right median APB motor nerve showed prolonged distal onset latency (9.1 ms, N<4.0). The right Median 2nd Digit sensory nerve showed no response F Wave studies indicate that the right Median F wave has delayed latency(35.2, N<69ms).  The left median APB motor nerve showed prolonged distal onset latency (9.4 ms, N<4.0). The left Median 2nd Digit sensory nerve showed no response F Wave studies indicate that the left Median F wave has delayed latency(35.8, N<60ms).   Bilateral Ulnar ADM motor nerves were within normal limits. F Wave studies indicate that the bilateral Ulnar F waves have normal latencies  The bilateralUlnar 5th digit sensory nerves were within normal limits.   EMG needle study of selected bilateral upper extremity muscles was performed. The right Opponens Pollicis muscle showed increased spontaneous activity (+3 positive waves) and the left Opponens Pollicis muscle showed increased spontaneous activity (+3 positive waves), decreased motor unit recruitment and polyphasic motor units.  The following muscles were normal bilaterally: Deltoid, Triceps, Pronator Teres, First Dorsal Interosseous.  Conclusion: This is an abnormal study. 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 No suggestion of polyneuropathy or radiculopathy. Clinical correlation and referral to hand surgeon recommended.   Sarina Ill, MD  [facility] [address] [place], [place] [address]

## 2015-12-13 DIAGNOSIS — M25561 Pain in right knee: Secondary | ICD-10-CM | POA: Diagnosis not present

## 2015-12-18 DIAGNOSIS — I70293 Other atherosclerosis of native arteries of extremities, bilateral legs: Secondary | ICD-10-CM | POA: Diagnosis not present

## 2015-12-18 DIAGNOSIS — E1351 Other specified diabetes mellitus with diabetic peripheral angiopathy without gangrene: Secondary | ICD-10-CM | POA: Diagnosis not present

## 2015-12-18 DIAGNOSIS — L602 Onychogryphosis: Secondary | ICD-10-CM | POA: Diagnosis not present

## 2016-01-24 DIAGNOSIS — M25532 Pain in left wrist: Secondary | ICD-10-CM | POA: Diagnosis not present

## 2016-01-24 DIAGNOSIS — M25531 Pain in right wrist: Secondary | ICD-10-CM | POA: Diagnosis not present

## 2016-01-30 DIAGNOSIS — G5600 Carpal tunnel syndrome, unspecified upper limb: Secondary | ICD-10-CM | POA: Diagnosis not present

## 2016-01-30 DIAGNOSIS — I10 Essential (primary) hypertension: Secondary | ICD-10-CM | POA: Diagnosis not present

## 2016-01-30 DIAGNOSIS — E119 Type 2 diabetes mellitus without complications: Secondary | ICD-10-CM | POA: Diagnosis not present

## 2016-01-30 DIAGNOSIS — Z7984 Long term (current) use of oral hypoglycemic drugs: Secondary | ICD-10-CM | POA: Diagnosis not present

## 2016-01-30 DIAGNOSIS — E7800 Pure hypercholesterolemia, unspecified: Secondary | ICD-10-CM | POA: Diagnosis not present

## 2016-01-30 DIAGNOSIS — E538 Deficiency of other specified B group vitamins: Secondary | ICD-10-CM | POA: Diagnosis not present

## 2016-02-19 DIAGNOSIS — E1351 Other specified diabetes mellitus with diabetic peripheral angiopathy without gangrene: Secondary | ICD-10-CM | POA: Diagnosis not present

## 2016-02-19 DIAGNOSIS — I70293 Other atherosclerosis of native arteries of extremities, bilateral legs: Secondary | ICD-10-CM | POA: Diagnosis not present

## 2016-02-19 DIAGNOSIS — L602 Onychogryphosis: Secondary | ICD-10-CM | POA: Diagnosis not present

## 2016-04-09 DIAGNOSIS — E119 Type 2 diabetes mellitus without complications: Secondary | ICD-10-CM | POA: Diagnosis not present

## 2016-04-09 DIAGNOSIS — H5712 Ocular pain, left eye: Secondary | ICD-10-CM | POA: Diagnosis not present

## 2016-04-09 DIAGNOSIS — H25811 Combined forms of age-related cataract, right eye: Secondary | ICD-10-CM | POA: Diagnosis not present

## 2016-04-09 DIAGNOSIS — H40052 Ocular hypertension, left eye: Secondary | ICD-10-CM | POA: Diagnosis not present

## 2016-04-09 DIAGNOSIS — T86840 Corneal transplant rejection: Secondary | ICD-10-CM | POA: Diagnosis not present

## 2016-04-22 DIAGNOSIS — I70293 Other atherosclerosis of native arteries of extremities, bilateral legs: Secondary | ICD-10-CM | POA: Diagnosis not present

## 2016-04-22 DIAGNOSIS — L602 Onychogryphosis: Secondary | ICD-10-CM | POA: Diagnosis not present

## 2016-04-22 DIAGNOSIS — E1351 Other specified diabetes mellitus with diabetic peripheral angiopathy without gangrene: Secondary | ICD-10-CM | POA: Diagnosis not present

## 2016-06-13 DIAGNOSIS — H25811 Combined forms of age-related cataract, right eye: Secondary | ICD-10-CM | POA: Diagnosis not present

## 2016-06-13 DIAGNOSIS — H40052 Ocular hypertension, left eye: Secondary | ICD-10-CM | POA: Diagnosis not present

## 2016-06-13 DIAGNOSIS — H524 Presbyopia: Secondary | ICD-10-CM | POA: Diagnosis not present

## 2016-06-13 DIAGNOSIS — E119 Type 2 diabetes mellitus without complications: Secondary | ICD-10-CM | POA: Diagnosis not present

## 2016-06-24 DIAGNOSIS — I70293 Other atherosclerosis of native arteries of extremities, bilateral legs: Secondary | ICD-10-CM | POA: Diagnosis not present

## 2016-06-24 DIAGNOSIS — L602 Onychogryphosis: Secondary | ICD-10-CM | POA: Diagnosis not present

## 2016-06-24 DIAGNOSIS — L84 Corns and callosities: Secondary | ICD-10-CM | POA: Diagnosis not present

## 2016-06-24 DIAGNOSIS — E1351 Other specified diabetes mellitus with diabetic peripheral angiopathy without gangrene: Secondary | ICD-10-CM | POA: Diagnosis not present

## 2016-07-29 DIAGNOSIS — H2511 Age-related nuclear cataract, right eye: Secondary | ICD-10-CM | POA: Diagnosis not present

## 2016-08-06 DIAGNOSIS — I10 Essential (primary) hypertension: Secondary | ICD-10-CM | POA: Diagnosis not present

## 2016-08-06 DIAGNOSIS — G5600 Carpal tunnel syndrome, unspecified upper limb: Secondary | ICD-10-CM | POA: Diagnosis not present

## 2016-08-06 DIAGNOSIS — Z23 Encounter for immunization: Secondary | ICD-10-CM | POA: Diagnosis not present

## 2016-08-06 DIAGNOSIS — E7800 Pure hypercholesterolemia, unspecified: Secondary | ICD-10-CM | POA: Diagnosis not present

## 2016-08-06 DIAGNOSIS — D171 Benign lipomatous neoplasm of skin and subcutaneous tissue of trunk: Secondary | ICD-10-CM | POA: Diagnosis not present

## 2016-08-06 DIAGNOSIS — M179 Osteoarthritis of knee, unspecified: Secondary | ICD-10-CM | POA: Diagnosis not present

## 2016-08-06 DIAGNOSIS — E119 Type 2 diabetes mellitus without complications: Secondary | ICD-10-CM | POA: Diagnosis not present

## 2016-08-06 DIAGNOSIS — Z7984 Long term (current) use of oral hypoglycemic drugs: Secondary | ICD-10-CM | POA: Diagnosis not present

## 2016-08-06 DIAGNOSIS — Z6828 Body mass index (BMI) 28.0-28.9, adult: Secondary | ICD-10-CM | POA: Diagnosis not present

## 2016-08-06 DIAGNOSIS — E663 Overweight: Secondary | ICD-10-CM | POA: Diagnosis not present

## 2016-08-12 DIAGNOSIS — H2511 Age-related nuclear cataract, right eye: Secondary | ICD-10-CM | POA: Diagnosis not present

## 2016-08-12 DIAGNOSIS — H25811 Combined forms of age-related cataract, right eye: Secondary | ICD-10-CM | POA: Diagnosis not present

## 2016-08-19 DIAGNOSIS — D170 Benign lipomatous neoplasm of skin and subcutaneous tissue of head, face and neck: Secondary | ICD-10-CM | POA: Diagnosis not present

## 2016-08-27 DIAGNOSIS — I70293 Other atherosclerosis of native arteries of extremities, bilateral legs: Secondary | ICD-10-CM | POA: Diagnosis not present

## 2016-08-27 DIAGNOSIS — L84 Corns and callosities: Secondary | ICD-10-CM | POA: Diagnosis not present

## 2016-08-27 DIAGNOSIS — E1351 Other specified diabetes mellitus with diabetic peripheral angiopathy without gangrene: Secondary | ICD-10-CM | POA: Diagnosis not present

## 2016-08-27 DIAGNOSIS — L602 Onychogryphosis: Secondary | ICD-10-CM | POA: Diagnosis not present

## 2016-09-20 ENCOUNTER — Other Ambulatory Visit: Payer: Self-pay | Admitting: General Surgery

## 2016-09-20 DIAGNOSIS — D170 Benign lipomatous neoplasm of skin and subcutaneous tissue of head, face and neck: Secondary | ICD-10-CM | POA: Diagnosis not present

## 2016-09-20 DIAGNOSIS — D171 Benign lipomatous neoplasm of skin and subcutaneous tissue of trunk: Secondary | ICD-10-CM | POA: Diagnosis not present

## 2016-10-15 DIAGNOSIS — R300 Dysuria: Secondary | ICD-10-CM | POA: Diagnosis not present

## 2016-10-15 DIAGNOSIS — R350 Frequency of micturition: Secondary | ICD-10-CM | POA: Diagnosis not present

## 2016-10-15 DIAGNOSIS — R1032 Left lower quadrant pain: Secondary | ICD-10-CM | POA: Diagnosis not present

## 2016-10-29 DIAGNOSIS — L602 Onychogryphosis: Secondary | ICD-10-CM | POA: Diagnosis not present

## 2016-10-29 DIAGNOSIS — L84 Corns and callosities: Secondary | ICD-10-CM | POA: Diagnosis not present

## 2017-01-22 DIAGNOSIS — I739 Peripheral vascular disease, unspecified: Secondary | ICD-10-CM | POA: Diagnosis not present

## 2017-01-22 DIAGNOSIS — E1151 Type 2 diabetes mellitus with diabetic peripheral angiopathy without gangrene: Secondary | ICD-10-CM | POA: Diagnosis not present

## 2017-01-22 DIAGNOSIS — L602 Onychogryphosis: Secondary | ICD-10-CM | POA: Diagnosis not present

## 2017-01-22 DIAGNOSIS — B351 Tinea unguium: Secondary | ICD-10-CM | POA: Diagnosis not present

## 2017-02-25 DIAGNOSIS — Z125 Encounter for screening for malignant neoplasm of prostate: Secondary | ICD-10-CM | POA: Diagnosis not present

## 2017-02-25 DIAGNOSIS — E119 Type 2 diabetes mellitus without complications: Secondary | ICD-10-CM | POA: Diagnosis not present

## 2017-02-25 DIAGNOSIS — Z6829 Body mass index (BMI) 29.0-29.9, adult: Secondary | ICD-10-CM | POA: Diagnosis not present

## 2017-02-25 DIAGNOSIS — I10 Essential (primary) hypertension: Secondary | ICD-10-CM | POA: Diagnosis not present

## 2017-02-25 DIAGNOSIS — G5600 Carpal tunnel syndrome, unspecified upper limb: Secondary | ICD-10-CM | POA: Diagnosis not present

## 2017-02-25 DIAGNOSIS — E7800 Pure hypercholesterolemia, unspecified: Secondary | ICD-10-CM | POA: Diagnosis not present

## 2017-02-25 DIAGNOSIS — E663 Overweight: Secondary | ICD-10-CM | POA: Diagnosis not present

## 2017-02-25 DIAGNOSIS — Z79899 Other long term (current) drug therapy: Secondary | ICD-10-CM | POA: Diagnosis not present

## 2017-02-25 DIAGNOSIS — Z7984 Long term (current) use of oral hypoglycemic drugs: Secondary | ICD-10-CM | POA: Diagnosis not present

## 2017-03-25 DIAGNOSIS — T86840 Corneal transplant rejection: Secondary | ICD-10-CM | POA: Diagnosis not present

## 2017-03-25 DIAGNOSIS — Z961 Presence of intraocular lens: Secondary | ICD-10-CM | POA: Diagnosis not present

## 2017-03-25 DIAGNOSIS — E119 Type 2 diabetes mellitus without complications: Secondary | ICD-10-CM | POA: Diagnosis not present

## 2017-03-25 DIAGNOSIS — H40052 Ocular hypertension, left eye: Secondary | ICD-10-CM | POA: Diagnosis not present

## 2017-03-25 DIAGNOSIS — H5712 Ocular pain, left eye: Secondary | ICD-10-CM | POA: Diagnosis not present

## 2017-03-25 DIAGNOSIS — H26491 Other secondary cataract, right eye: Secondary | ICD-10-CM | POA: Diagnosis not present

## 2017-04-09 DIAGNOSIS — L602 Onychogryphosis: Secondary | ICD-10-CM | POA: Diagnosis not present

## 2017-04-09 DIAGNOSIS — E1351 Other specified diabetes mellitus with diabetic peripheral angiopathy without gangrene: Secondary | ICD-10-CM | POA: Diagnosis not present

## 2017-04-30 DIAGNOSIS — H1820 Unspecified corneal edema: Secondary | ICD-10-CM | POA: Diagnosis not present

## 2017-04-30 DIAGNOSIS — H40052 Ocular hypertension, left eye: Secondary | ICD-10-CM | POA: Diagnosis not present

## 2017-04-30 DIAGNOSIS — H26491 Other secondary cataract, right eye: Secondary | ICD-10-CM | POA: Diagnosis not present

## 2017-04-30 DIAGNOSIS — T86840 Corneal transplant rejection: Secondary | ICD-10-CM | POA: Diagnosis not present

## 2017-06-05 ENCOUNTER — Ambulatory Visit (INDEPENDENT_AMBULATORY_CARE_PROVIDER_SITE_OTHER): Payer: Medicare Other | Admitting: Orthopedic Surgery

## 2017-06-05 ENCOUNTER — Encounter (INDEPENDENT_AMBULATORY_CARE_PROVIDER_SITE_OTHER): Payer: Self-pay | Admitting: Orthopedic Surgery

## 2017-06-05 ENCOUNTER — Ambulatory Visit (INDEPENDENT_AMBULATORY_CARE_PROVIDER_SITE_OTHER): Payer: Medicare Other

## 2017-06-05 DIAGNOSIS — M79605 Pain in left leg: Secondary | ICD-10-CM

## 2017-06-06 ENCOUNTER — Encounter (INDEPENDENT_AMBULATORY_CARE_PROVIDER_SITE_OTHER): Payer: Self-pay | Admitting: Orthopedic Surgery

## 2017-06-06 NOTE — Progress Notes (Signed)
Office Visit Note   Patient: [name]           Date of Birth: [birth year]           MRN: [id number] Visit Date: 06/05/2017 Requested by: Kathyrn Lass, [place], [place] [address] PCP: Kathyrn Lass, MD  Subjective: Chief Complaint  Patient presents with  . Left Leg - Pain    HPI: [name] is a 76 year old patient with left thigh pain.  Describes constant pain mostly with ambulation.  He states that the left leg feels very weak and feels like it will give way.  He is emanating with a cane because afraid of falling.  This pain will wake him from sleep at night.  Denies any history of trauma.  Takes ibuprofen for pain.  Does have a history of spontaneous bilateral knee infections treated with arthrotomies several years ago.  States his back is feeling okay.  He cannot sleep on the left-hand side.  Denies any fevers or chills.              ROS: All systems reviewed are negative as they relate to the chief complaint within the history of present illness.  Patient denies  fevers or chills.   Assessment & Plan: Visit Diagnoses:  1. Pain in left leg     Plan: Impression is endstage left hip arthritis.  This looks fairly severe with cyst formation in the acetabulum and femoral head on both sides of the joint.  He has long-standing severe knee arthritis particularly worse on the right-hand side.  He's able to manage him up with that pretty well with this hip pain looks like it is very symptomatic.  We discussed treatment options today including observation injections which would give temporary relief along with total hip replacement.  He wants to hold off on all those interventions.  I'll see him back as needed  Follow-Up Instructions: Return if symptoms worsen or fail to improve.   Orders:  Orders Placed This Encounter  Procedures  . XR HIP UNILAT W OR W/O PELVIS 2-3 VIEWS LEFT   No orders of the defined types were placed in this encounter.     Procedures: No procedures performed   Clinical Data: No additional findings.  Objective: Vital Signs: There were no vitals taken for this visit.  Physical Exam:   Constitutional: Patient appears well-developed HEENT:  Head: Normocephalic Eyes:EOM are normal Neck: Normal range of motion Cardiovascular: Normal rate Pulmonary/chest: Effort normal Neurologic: Patient is alert Skin: Skin is warm Psychiatric: Patient has normal mood and affect    Ortho Exam: Orthopedic exam demonstrates full active and passive range of motion of the ankles with a little bit of mild crepitus with bilateral knee range of motion.  There is no effusion in either knee and well-healed surgical incisions on both knees.  Left hip demonstrates restricted internal rotation and pain with internal rotation and external rotation on the left.  No loss of motion on the right-hand side and no pain with range of motion on the right.  There is no nerve root tension signs and no definite paresthesias L1 S1 bilaterally.  Motor strength is intact and there is no wasting.  Specialty Comments:  No specialty comments available.  Imaging: No results found.   PMFS History: Patient Active Problem List   Diagnosis Date Noted  . Protein-calorie malnutrition, severe ([place]) 04/28/2014  . Acute blood loss anemia 04/28/2014  . Septic joint ([place]) 04/25/2014  .  DVT of lower extremity, bilateral ([place]) 04/01/2014  . Bacterial infection of knee joint ([place]) 03/29/2014  . Essential hypertension 03/01/2014  . Chest pain 03/01/2014  . Diabetes mellitus type II, controlled ([place]) 03/01/2014  . Hyperlipidemia 03/01/2014   Past Medical History:  Diagnosis Date  . Attention deficit disorder   . Blind left eye   . Diabetes mellitus without complication ([place])   . Hyperlipidemia   . Hypertension     Family History  Problem Relation Age of Onset  . Heart attack Father   . Heart attack Sister   . Heart attack Brother     Past  Surgical History:  Procedure Laterality Date  . EYE SURGERY Left    x3  . KNEE ARTHROSCOPY Bilateral 03/29/2014   Procedure: RIGHT KNEE ARTHROSCOPY AND DEBRIDEMENT, LEFT KNEE ASPIRATION,  LEFT KNEE ARTHROSCOPY AND DEBRIDEMENT.;  Surgeon: Meredith Pel, MD;  Location: [facility];  Service: Orthopedics;  Laterality: Bilateral;  RIGHT KNEE ARTHROSCOPY AND DEBRIDEMENT, LEFT KNEE ASPIRATION,  LEFT KNEE ARTHROSCOPY AND DEBRIDEMENT.  Marland Kitchen KNEE ARTHROTOMY Bilateral 04/26/2014   Procedure: KNEE ARTHROTOMY, SYNOVECTOMY, ANTIBIOTIC BEAD PLACEMENT.;  Surgeon: Meredith Pel, MD;  Location: [facility];  Service: Orthopedics;  Laterality: Bilateral;  . lipoma removal, right upper back    . TONSILLECTOMY     Social History   Occupational History  . Not on file.   Social History Main Topics  . Smoking status: Former Research scientist (life sciences)  . Smokeless tobacco: Not on file  . Alcohol use No  . Drug use: No  . Sexual activity: Not on file

## 2017-06-25 DIAGNOSIS — L84 Corns and callosities: Secondary | ICD-10-CM | POA: Diagnosis not present

## 2017-06-25 DIAGNOSIS — E1351 Other specified diabetes mellitus with diabetic peripheral angiopathy without gangrene: Secondary | ICD-10-CM | POA: Diagnosis not present

## 2017-06-25 DIAGNOSIS — L602 Onychogryphosis: Secondary | ICD-10-CM | POA: Diagnosis not present

## 2017-07-02 ENCOUNTER — Ambulatory Visit (INDEPENDENT_AMBULATORY_CARE_PROVIDER_SITE_OTHER): Payer: Medicare Other

## 2017-07-02 ENCOUNTER — Encounter (INDEPENDENT_AMBULATORY_CARE_PROVIDER_SITE_OTHER): Payer: Self-pay | Admitting: Family

## 2017-07-02 ENCOUNTER — Ambulatory Visit (INDEPENDENT_AMBULATORY_CARE_PROVIDER_SITE_OTHER): Payer: Medicare Other | Admitting: Family

## 2017-07-02 VITALS — Ht 68.0 in | Wt 165.0 lb

## 2017-07-02 DIAGNOSIS — M25511 Pain in right shoulder: Secondary | ICD-10-CM

## 2017-07-02 NOTE — Progress Notes (Signed)
Office Visit Note   Patient: [name]           Date of Birth: [birth year]           MRN: [id number] Visit Date: 07/02/2017              Requested by: Kathyrn Lass, [place], [place] [address] PCP: Kathyrn Lass, MD  Chief Complaint  Patient presents with  . Right Shoulder - Injury      HPI: The patient is a 76 year old gentleman who presents today complaining of right shoulder pain. He fell on his right side while doing some yard work he was trying to trim some limbs of a tree and tripped over his pulse. Direct impact on the right side. He complains of pain has difficulty with range of motion and had a very hard time sleeping. He is right hand dominant.  States is taking Aleve with moderate relief of pain.  Assessment & Plan: Visit Diagnoses:  1. Acute pain of right shoulder     Plan: Radiographs concerning for humeral head fracture. Have placed him in a sling today. He'll be nonweightbearing with the right upper extremity follow-up in office in 2 weeks with repeat radiographs.  Follow-Up Instructions: Return in about 2 weeks (around 07/16/2017) for With [facility].   Right Shoulder Exam   Tenderness  The patient is experiencing tenderness in the acromioclavicular joint.  Other  Erythema: absent Pulse: present  Comments:  Difficult to examine patient resists range of motion. Has active abduction to about 70. Mild proximal humerus tenderness. Primarily tender over the anterior shoulder.      Patient is alert, oriented, no adenopathy, well-dressed, normal affect, normal respiratory effort.   Imaging: Xr Shoulder Right  Result Date: 07/02/2017 Radiographs of the right shoulder show a lucency to the humeral head concerning for fracture. This is also seen on the lateral lateral view. The clavicle appears to be slightly elevated, a few millimeters.   No images are attached to the encounter.  Labs: Lab Results  Component Value Date   HGBA1C 7.5  (H) 03/29/2014   ESRSEDRATE 1 07/11/2014   ESRSEDRATE 140 (H) 04/29/2014   CRP <0.5 07/11/2014   CRP 16.2 (H) 04/29/2014   LABURIC 4.8 03/20/2014   REPTSTATUS 05/04/2014 FINAL 04/28/2014   GRAMSTAIN  04/26/2014    WBC PRESENT,BOTH PMN AND MONONUCLEAR NO ORGANISMS SEEN Performed at [facility]  04/26/2014    RARE WBC PRESENT,BOTH PMN AND MONONUCLEAR NO SQUAMOUS EPITHELIAL CELLS SEEN NO ORGANISMS SEEN Performed at [facility]  04/28/2014    NO GROWTH 5 DAYS Performed at [facility] 03/29/2014    Orders:  Orders Placed This Encounter  Procedures  . XR Shoulder Right   No orders of the defined types were placed in this encounter.    Procedures: No procedures performed  Clinical Data: No additional findings.  ROS:  All other systems negative, except as noted in the HPI. Review of Systems  Constitutional: Negative for chills and fever.  Musculoskeletal: Positive for arthralgias, joint swelling and myalgias.    Objective: Vital Signs: Ht 5\' 8"  (1.727 m)   Wt 165 lb (74.8 kg)   BMI 25.09 kg/m   Specialty Comments:  No specialty comments available.  PMFS History: Patient Active Problem List   Diagnosis Date Noted  . Protein-calorie malnutrition, severe ([place]) 04/28/2014  . Acute blood loss anemia 04/28/2014  .  Septic joint ([place]) 04/25/2014  . DVT of lower extremity, bilateral ([place]) 04/01/2014  . Bacterial infection of knee joint ([place]) 03/29/2014  . Essential hypertension 03/01/2014  . Chest pain 03/01/2014  . Diabetes mellitus type II, controlled ([place]) 03/01/2014  . Hyperlipidemia 03/01/2014   Past Medical History:  Diagnosis Date  . Attention deficit disorder   . Blind left eye   . Diabetes mellitus without complication ([place])   . Hyperlipidemia   . Hypertension     Family History  Problem Relation Age of Onset  . Heart attack Father   . Heart attack Sister   . Heart  attack Brother     Past Surgical History:  Procedure Laterality Date  . EYE SURGERY Left    x3  . KNEE ARTHROSCOPY Bilateral 03/29/2014   Procedure: RIGHT KNEE ARTHROSCOPY AND DEBRIDEMENT, LEFT KNEE ASPIRATION,  LEFT KNEE ARTHROSCOPY AND DEBRIDEMENT.;  Surgeon: Meredith Pel, MD;  Location: [facility];  Service: Orthopedics;  Laterality: Bilateral;  RIGHT KNEE ARTHROSCOPY AND DEBRIDEMENT, LEFT KNEE ASPIRATION,  LEFT KNEE ARTHROSCOPY AND DEBRIDEMENT.  Marland Kitchen KNEE ARTHROTOMY Bilateral 04/26/2014   Procedure: KNEE ARTHROTOMY, SYNOVECTOMY, ANTIBIOTIC BEAD PLACEMENT.;  Surgeon: Meredith Pel, MD;  Location: [facility];  Service: Orthopedics;  Laterality: Bilateral;  . lipoma removal, right upper back    . TONSILLECTOMY     Social History   Occupational History  . Not on file.   Social History Main Topics  . Smoking status: Former Research scientist (life sciences)  . Smokeless tobacco: Never Used  . Alcohol use No  . Drug use: No  . Sexual activity: Not on file

## 2017-07-17 ENCOUNTER — Encounter (INDEPENDENT_AMBULATORY_CARE_PROVIDER_SITE_OTHER): Payer: Self-pay | Admitting: Orthopedic Surgery

## 2017-07-17 ENCOUNTER — Ambulatory Visit (INDEPENDENT_AMBULATORY_CARE_PROVIDER_SITE_OTHER): Payer: Medicare Other | Admitting: Orthopedic Surgery

## 2017-07-17 ENCOUNTER — Ambulatory Visit (INDEPENDENT_AMBULATORY_CARE_PROVIDER_SITE_OTHER): Payer: Medicare Other

## 2017-07-17 DIAGNOSIS — M25511 Pain in right shoulder: Secondary | ICD-10-CM | POA: Diagnosis not present

## 2017-07-20 NOTE — Progress Notes (Signed)
Office Visit Note   Patient: [name]           Date of Birth: [birth year]           MRN: [id number] Visit Date: 07/17/2017 Requested by: Kathyrn Lass, [place], [place] [address] PCP: Kathyrn Lass, MD  Subjective: Chief Complaint  Patient presents with  . Right Shoulder - Follow-up    HPI: [name] is a 76 year old patient with right shoulder pain.  He had a fall 815 and 18.  There was question of fracture.  He's doing much better today but still has some soreness.  He is right-hand-dominant.  He said only surgeries in the past and is doing reasonably well from that also.              ROS: All systems reviewed are negative as they relate to the chief complaint within the history of present illness.  Patient denies  fevers or chills.   Assessment & Plan: Visit Diagnoses:  1. Right shoulder pain, unspecified chronicity     Plan: Impression his aggravation of rotator cuff arthropathy right shoulder with no evidence of fracture.  He has made very good recovery and has for flexion and abduction above 90 at this time.  Plan is to continue with activity as tolerated.  Radiographs confirm no additional fracture.  I'll see him back as needed  Follow-Up Instructions: Return if symptoms worsen or fail to improve.   Orders:  Orders Placed This Encounter  Procedures  . XR Shoulder Right   No orders of the defined types were placed in this encounter.     Procedures: No procedures performed   Clinical Data: No additional findings.  Objective: Vital Signs: There were no vitals taken for this visit.  Physical Exam:   Constitutional: Patient appears well-developed HEENT:  Head: Normocephalic Eyes:EOM are normal Neck: Normal range of motion Cardiovascular: Normal rate Pulmonary/chest: Effort normal Neurologic: Patient is alert Skin: Skin is warm Psychiatric: Patient has normal mood and affect    Ortho Exam: Orthopedic exam demonstrates forward  flexion and abduction to 90.  No course grinding crepitus noted in the right shoulder apart from that which would be expected from rotator cuff arthropathy.  No bruising or ecchymosis noted in the right shoulder region.  Motor sensory function to the hand is intact.  He doesn't really have much pain when he actively forward flexes and abduction the right arm.  Specialty Comments:  No specialty comments available.  Imaging: No results found.   PMFS History: Patient Active Problem List   Diagnosis Date Noted  . Protein-calorie malnutrition, severe ([place]) 04/28/2014  . Acute blood loss anemia 04/28/2014  . Septic joint ([place]) 04/25/2014  . DVT of lower extremity, bilateral ([place]) 04/01/2014  . Bacterial infection of knee joint ([place]) 03/29/2014  . Essential hypertension 03/01/2014  . Chest pain 03/01/2014  . Diabetes mellitus type II, controlled ([place]) 03/01/2014  . Hyperlipidemia 03/01/2014   Past Medical History:  Diagnosis Date  . Attention deficit disorder   . Blind left eye   . Diabetes mellitus without complication ([place])   . Hyperlipidemia   . Hypertension     Family History  Problem Relation Age of Onset  . Heart attack Father   . Heart attack Sister   . Heart attack Brother     Past Surgical History:  Procedure Laterality Date  . EYE SURGERY Left    x3  . KNEE ARTHROSCOPY Bilateral 03/29/2014   Procedure:  RIGHT KNEE ARTHROSCOPY AND DEBRIDEMENT, LEFT KNEE ASPIRATION,  LEFT KNEE ARTHROSCOPY AND DEBRIDEMENT.;  Surgeon: Meredith Pel, MD;  Location: [facility];  Service: Orthopedics;  Laterality: Bilateral;  RIGHT KNEE ARTHROSCOPY AND DEBRIDEMENT, LEFT KNEE ASPIRATION,  LEFT KNEE ARTHROSCOPY AND DEBRIDEMENT.  Marland Kitchen KNEE ARTHROTOMY Bilateral 04/26/2014   Procedure: KNEE ARTHROTOMY, SYNOVECTOMY, ANTIBIOTIC BEAD PLACEMENT.;  Surgeon: Meredith Pel, MD;  Location: [facility];  Service: Orthopedics;  Laterality: Bilateral;  . lipoma removal, right upper back    . TONSILLECTOMY      Social History   Occupational History  . Not on file.   Social History Main Topics  . Smoking status: Former Research scientist (life sciences)  . Smokeless tobacco: Never Used  . Alcohol use No  . Drug use: No  . Sexual activity: Not on file

## 2017-08-26 DIAGNOSIS — E119 Type 2 diabetes mellitus without complications: Secondary | ICD-10-CM | POA: Diagnosis not present

## 2017-08-26 DIAGNOSIS — Z7984 Long term (current) use of oral hypoglycemic drugs: Secondary | ICD-10-CM | POA: Diagnosis not present

## 2017-08-26 DIAGNOSIS — I10 Essential (primary) hypertension: Secondary | ICD-10-CM | POA: Diagnosis not present

## 2017-08-26 DIAGNOSIS — Z23 Encounter for immunization: Secondary | ICD-10-CM | POA: Diagnosis not present

## 2017-08-26 DIAGNOSIS — E1351 Other specified diabetes mellitus with diabetic peripheral angiopathy without gangrene: Secondary | ICD-10-CM | POA: Diagnosis not present

## 2017-08-26 DIAGNOSIS — L602 Onychogryphosis: Secondary | ICD-10-CM | POA: Diagnosis not present

## 2017-08-26 DIAGNOSIS — M25511 Pain in right shoulder: Secondary | ICD-10-CM | POA: Diagnosis not present

## 2017-08-26 DIAGNOSIS — E663 Overweight: Secondary | ICD-10-CM | POA: Diagnosis not present

## 2017-08-26 DIAGNOSIS — G5600 Carpal tunnel syndrome, unspecified upper limb: Secondary | ICD-10-CM | POA: Diagnosis not present

## 2017-08-26 DIAGNOSIS — M1612 Unilateral primary osteoarthritis, left hip: Secondary | ICD-10-CM | POA: Diagnosis not present

## 2017-08-26 DIAGNOSIS — Z6828 Body mass index (BMI) 28.0-28.9, adult: Secondary | ICD-10-CM | POA: Diagnosis not present

## 2017-08-26 DIAGNOSIS — E7800 Pure hypercholesterolemia, unspecified: Secondary | ICD-10-CM | POA: Diagnosis not present

## 2017-08-26 DIAGNOSIS — L84 Corns and callosities: Secondary | ICD-10-CM | POA: Diagnosis not present

## 2017-08-29 DIAGNOSIS — H40052 Ocular hypertension, left eye: Secondary | ICD-10-CM | POA: Diagnosis not present

## 2017-08-29 DIAGNOSIS — H1820 Unspecified corneal edema: Secondary | ICD-10-CM | POA: Diagnosis not present

## 2017-08-29 DIAGNOSIS — E119 Type 2 diabetes mellitus without complications: Secondary | ICD-10-CM | POA: Diagnosis not present

## 2017-08-29 DIAGNOSIS — T86840 Corneal transplant rejection: Secondary | ICD-10-CM | POA: Diagnosis not present

## 2017-09-24 ENCOUNTER — Ambulatory Visit (INDEPENDENT_AMBULATORY_CARE_PROVIDER_SITE_OTHER): Payer: Medicare Other | Admitting: Orthopedic Surgery

## 2017-10-01 ENCOUNTER — Encounter (INDEPENDENT_AMBULATORY_CARE_PROVIDER_SITE_OTHER): Payer: Self-pay | Admitting: Orthopedic Surgery

## 2017-10-01 ENCOUNTER — Ambulatory Visit (INDEPENDENT_AMBULATORY_CARE_PROVIDER_SITE_OTHER): Payer: Medicare Other | Admitting: Orthopedic Surgery

## 2017-10-01 DIAGNOSIS — M79642 Pain in left hand: Secondary | ICD-10-CM | POA: Diagnosis not present

## 2017-10-01 DIAGNOSIS — M79641 Pain in right hand: Secondary | ICD-10-CM

## 2017-10-04 ENCOUNTER — Encounter (INDEPENDENT_AMBULATORY_CARE_PROVIDER_SITE_OTHER): Payer: Self-pay | Admitting: Orthopedic Surgery

## 2017-10-04 NOTE — Progress Notes (Signed)
Office Visit Note   Patient: [name]           Date of Birth: 05/25/1941           MRN: [id number] Visit Date: 10/01/2017 Requested by: Kathyrn Lass, [place], [place] [address] PCP: Kathyrn Lass, MD  Subjective: Chief Complaint  Patient presents with  . Hand Pain    bilateral hand pain and numbness    HPI: [name] is a patient who reports bilateral hand pain.  States that both hands have a numb and tingling sensation.  Is usually worse in the morning after he wakes up.  He is right-hand-dominant.  He denies any neck pain or radicular pain.  Pain is been going on for 2 months.  States that it's affecting palm side only.  No dorsal hand numbness is described.              ROS: All systems reviewed are negative as they relate to the chief complaint within the history of present illness.  Patient denies  fevers or chills.   Assessment & Plan: Visit Diagnoses:  1. Bilateral hand pain     Plan: Impression is bilateral hand pain with likely carpal tunnel syndrome.  He's not interested in any type of surgical intervention.  I went to try him with bilateral wrist splints.  If that doesn't work then we could consider carpal tunnel injections.  He doesn't have much in the way of abductor pollicis brevis wasting.  Follow-up as needed  Follow-Up Instructions: Return if symptoms worsen or fail to improve.   Orders:  No orders of the defined types were placed in this encounter.  No orders of the defined types were placed in this encounter.     Procedures: No procedures performed   Clinical Data: No additional findings.  Objective: Vital Signs: There were no vitals taken for this visit.  Physical Exam:   Constitutional: Patient appears well-developed HEENT:  Head: Normocephalic Eyes:EOM are normal Neck: Normal range of motion Cardiovascular: Normal rate Pulmonary/chest: Effort normal Neurologic: Patient is alert Skin: Skin is warm Psychiatric:  Patient has normal mood and affect    Ortho Exam: Orthopedic exam demonstrates full active and passive range of motion of both wrists.  No snuffbox tenderness on either side.  Not much in the way of [place] arthritis.  No first dorsal compartment tenderness.  Range of motion and noted digits normal.  No abductor pollicis brevis wasting.  Negative Tinel's cubital tunnel in both elbows.  Specialty Comments:  No specialty comments available.  Imaging: No results found.   PMFS History: Patient Active Problem List   Diagnosis Date Noted  . Protein-calorie malnutrition, severe ([place]) 04/28/2014  . Acute blood loss anemia 04/28/2014  . Septic joint ([employer]) 04/25/2014  . DVT of lower extremity, bilateral ([place]) 04/01/2014  . Bacterial infection of knee joint ([place]) 03/29/2014  . Essential hypertension 03/01/2014  . Chest pain 03/01/2014  . Diabetes mellitus type II, controlled ([place]) 03/01/2014  . Hyperlipidemia 03/01/2014   Past Medical History:  Diagnosis Date  . Attention deficit disorder   . Blind left eye   . Diabetes mellitus without complication ([place])   . Hyperlipidemia   . Hypertension     Family History  Problem Relation Age of Onset  . Heart attack Father   . Heart attack Sister   . Heart attack Brother     Past Surgical History:  Procedure Laterality Date  . EYE SURGERY Left  x3  . KNEE ARTHROTOMY, SYNOVECTOMY, ANTIBIOTIC BEAD PLACEMENT. Bilateral 04/26/2014   Performed by Meredith Pel, MD at [facility]  . lipoma removal, right upper back    . RIGHT KNEE ARTHROSCOPY AND DEBRIDEMENT, LEFT KNEE ASPIRATION,  LEFT KNEE ARTHROSCOPY AND DEBRIDEMENT. Bilateral 03/29/2014   Performed by Meredith Pel, MD at [facility]  . TONSILLECTOMY     Social History   Occupational History  . Not on file  Tobacco Use  . Smoking status: Former Research scientist (life sciences)  . Smokeless tobacco: Never Used  Substance and Sexual Activity  . Alcohol use: No  . Drug use: No  . Sexual activity: Not on file

## 2017-11-04 DIAGNOSIS — E1351 Other specified diabetes mellitus with diabetic peripheral angiopathy without gangrene: Secondary | ICD-10-CM | POA: Diagnosis not present

## 2017-11-04 DIAGNOSIS — L602 Onychogryphosis: Secondary | ICD-10-CM | POA: Diagnosis not present

## 2017-11-05 DIAGNOSIS — R1032 Left lower quadrant pain: Secondary | ICD-10-CM | POA: Diagnosis not present

## 2017-11-05 DIAGNOSIS — K5900 Constipation, unspecified: Secondary | ICD-10-CM | POA: Diagnosis not present

## 2017-12-30 DIAGNOSIS — H1820 Unspecified corneal edema: Secondary | ICD-10-CM | POA: Diagnosis not present

## 2017-12-30 DIAGNOSIS — E119 Type 2 diabetes mellitus without complications: Secondary | ICD-10-CM | POA: Diagnosis not present

## 2017-12-30 DIAGNOSIS — H40052 Ocular hypertension, left eye: Secondary | ICD-10-CM | POA: Diagnosis not present

## 2017-12-30 DIAGNOSIS — T86840 Corneal transplant rejection: Secondary | ICD-10-CM | POA: Diagnosis not present

## 2018-01-08 DIAGNOSIS — S90121A Contusion of right lesser toe(s) without damage to nail, initial encounter: Secondary | ICD-10-CM | POA: Diagnosis not present

## 2018-01-08 DIAGNOSIS — M79671 Pain in right foot: Secondary | ICD-10-CM | POA: Diagnosis not present

## 2018-01-08 DIAGNOSIS — L602 Onychogryphosis: Secondary | ICD-10-CM | POA: Diagnosis not present

## 2018-01-08 DIAGNOSIS — E1151 Type 2 diabetes mellitus with diabetic peripheral angiopathy without gangrene: Secondary | ICD-10-CM | POA: Diagnosis not present

## 2018-02-17 DIAGNOSIS — K5900 Constipation, unspecified: Secondary | ICD-10-CM | POA: Diagnosis not present

## 2018-03-13 DIAGNOSIS — R310 Gross hematuria: Secondary | ICD-10-CM | POA: Diagnosis not present

## 2018-03-19 DIAGNOSIS — E1151 Type 2 diabetes mellitus with diabetic peripheral angiopathy without gangrene: Secondary | ICD-10-CM | POA: Diagnosis not present

## 2018-03-19 DIAGNOSIS — L602 Onychogryphosis: Secondary | ICD-10-CM | POA: Diagnosis not present

## 2018-05-08 DIAGNOSIS — Z6828 Body mass index (BMI) 28.0-28.9, adult: Secondary | ICD-10-CM | POA: Diagnosis not present

## 2018-05-08 DIAGNOSIS — G5600 Carpal tunnel syndrome, unspecified upper limb: Secondary | ICD-10-CM | POA: Diagnosis not present

## 2018-05-08 DIAGNOSIS — E7800 Pure hypercholesterolemia, unspecified: Secondary | ICD-10-CM | POA: Diagnosis not present

## 2018-05-08 DIAGNOSIS — I10 Essential (primary) hypertension: Secondary | ICD-10-CM | POA: Diagnosis not present

## 2018-05-08 DIAGNOSIS — E1169 Type 2 diabetes mellitus with other specified complication: Secondary | ICD-10-CM | POA: Diagnosis not present

## 2018-05-13 DIAGNOSIS — R319 Hematuria, unspecified: Secondary | ICD-10-CM | POA: Diagnosis not present

## 2018-05-28 DIAGNOSIS — E1151 Type 2 diabetes mellitus with diabetic peripheral angiopathy without gangrene: Secondary | ICD-10-CM | POA: Diagnosis not present

## 2018-05-28 DIAGNOSIS — L602 Onychogryphosis: Secondary | ICD-10-CM | POA: Diagnosis not present

## 2018-06-30 DIAGNOSIS — T86840 Corneal transplant rejection: Secondary | ICD-10-CM | POA: Diagnosis not present

## 2018-06-30 DIAGNOSIS — E119 Type 2 diabetes mellitus without complications: Secondary | ICD-10-CM | POA: Diagnosis not present

## 2018-06-30 DIAGNOSIS — H1820 Unspecified corneal edema: Secondary | ICD-10-CM | POA: Diagnosis not present

## 2018-06-30 DIAGNOSIS — H40052 Ocular hypertension, left eye: Secondary | ICD-10-CM | POA: Diagnosis not present

## 2018-07-29 DIAGNOSIS — R310 Gross hematuria: Secondary | ICD-10-CM | POA: Diagnosis not present

## 2018-07-29 DIAGNOSIS — R8271 Bacteriuria: Secondary | ICD-10-CM | POA: Diagnosis not present

## 2018-07-29 DIAGNOSIS — R3915 Urgency of urination: Secondary | ICD-10-CM | POA: Diagnosis not present

## 2018-07-29 DIAGNOSIS — R3912 Poor urinary stream: Secondary | ICD-10-CM | POA: Diagnosis not present

## 2018-07-29 DIAGNOSIS — N401 Enlarged prostate with lower urinary tract symptoms: Secondary | ICD-10-CM | POA: Diagnosis not present

## 2018-08-06 DIAGNOSIS — R310 Gross hematuria: Secondary | ICD-10-CM | POA: Diagnosis not present

## 2018-08-10 DIAGNOSIS — L602 Onychogryphosis: Secondary | ICD-10-CM | POA: Diagnosis not present

## 2018-08-10 DIAGNOSIS — E1151 Type 2 diabetes mellitus with diabetic peripheral angiopathy without gangrene: Secondary | ICD-10-CM | POA: Diagnosis not present

## 2018-08-27 DIAGNOSIS — N35011 Post-traumatic bulbous urethral stricture: Secondary | ICD-10-CM | POA: Diagnosis not present

## 2018-08-27 DIAGNOSIS — R310 Gross hematuria: Secondary | ICD-10-CM | POA: Diagnosis not present

## 2018-10-21 DIAGNOSIS — E1151 Type 2 diabetes mellitus with diabetic peripheral angiopathy without gangrene: Secondary | ICD-10-CM | POA: Diagnosis not present

## 2018-10-21 DIAGNOSIS — L602 Onychogryphosis: Secondary | ICD-10-CM | POA: Diagnosis not present

## 2018-11-09 DIAGNOSIS — Z6828 Body mass index (BMI) 28.0-28.9, adult: Secondary | ICD-10-CM | POA: Diagnosis not present

## 2018-11-09 DIAGNOSIS — M1612 Unilateral primary osteoarthritis, left hip: Secondary | ICD-10-CM | POA: Diagnosis not present

## 2018-11-09 DIAGNOSIS — M179 Osteoarthritis of knee, unspecified: Secondary | ICD-10-CM | POA: Diagnosis not present

## 2018-11-09 DIAGNOSIS — Z23 Encounter for immunization: Secondary | ICD-10-CM | POA: Diagnosis not present

## 2018-11-09 DIAGNOSIS — I10 Essential (primary) hypertension: Secondary | ICD-10-CM | POA: Diagnosis not present

## 2018-11-09 DIAGNOSIS — E663 Overweight: Secondary | ICD-10-CM | POA: Diagnosis not present

## 2018-11-09 DIAGNOSIS — E1169 Type 2 diabetes mellitus with other specified complication: Secondary | ICD-10-CM | POA: Diagnosis not present

## 2018-11-09 DIAGNOSIS — E7800 Pure hypercholesterolemia, unspecified: Secondary | ICD-10-CM | POA: Diagnosis not present

## 2018-11-23 DIAGNOSIS — R8271 Bacteriuria: Secondary | ICD-10-CM | POA: Diagnosis not present

## 2018-11-23 DIAGNOSIS — N35011 Post-traumatic bulbous urethral stricture: Secondary | ICD-10-CM | POA: Diagnosis not present

## 2018-12-30 DIAGNOSIS — E1151 Type 2 diabetes mellitus with diabetic peripheral angiopathy without gangrene: Secondary | ICD-10-CM | POA: Diagnosis not present

## 2018-12-30 DIAGNOSIS — L602 Onychogryphosis: Secondary | ICD-10-CM | POA: Diagnosis not present

## 2019-01-05 DIAGNOSIS — H40052 Ocular hypertension, left eye: Secondary | ICD-10-CM | POA: Diagnosis not present

## 2019-01-05 DIAGNOSIS — H1820 Unspecified corneal edema: Secondary | ICD-10-CM | POA: Diagnosis not present

## 2019-01-05 DIAGNOSIS — H26491 Other secondary cataract, right eye: Secondary | ICD-10-CM | POA: Diagnosis not present

## 2019-01-05 DIAGNOSIS — H35031 Hypertensive retinopathy, right eye: Secondary | ICD-10-CM | POA: Diagnosis not present

## 2019-01-05 DIAGNOSIS — E119 Type 2 diabetes mellitus without complications: Secondary | ICD-10-CM | POA: Diagnosis not present

## 2019-01-05 DIAGNOSIS — T86840 Corneal transplant rejection: Secondary | ICD-10-CM | POA: Diagnosis not present

## 2019-03-24 DIAGNOSIS — L602 Onychogryphosis: Secondary | ICD-10-CM | POA: Diagnosis not present

## 2019-03-24 DIAGNOSIS — E1151 Type 2 diabetes mellitus with diabetic peripheral angiopathy without gangrene: Secondary | ICD-10-CM | POA: Diagnosis not present

## 2019-05-28 DIAGNOSIS — Z0000 Encounter for general adult medical examination without abnormal findings: Secondary | ICD-10-CM | POA: Diagnosis not present

## 2019-06-02 DIAGNOSIS — E7800 Pure hypercholesterolemia, unspecified: Secondary | ICD-10-CM | POA: Diagnosis not present

## 2019-06-02 DIAGNOSIS — E1169 Type 2 diabetes mellitus with other specified complication: Secondary | ICD-10-CM | POA: Diagnosis not present

## 2019-06-02 DIAGNOSIS — I10 Essential (primary) hypertension: Secondary | ICD-10-CM | POA: Diagnosis not present

## 2019-06-02 DIAGNOSIS — E1151 Type 2 diabetes mellitus with diabetic peripheral angiopathy without gangrene: Secondary | ICD-10-CM | POA: Diagnosis not present

## 2019-06-02 DIAGNOSIS — Z87891 Personal history of nicotine dependence: Secondary | ICD-10-CM | POA: Diagnosis not present

## 2019-06-02 DIAGNOSIS — L602 Onychogryphosis: Secondary | ICD-10-CM | POA: Diagnosis not present

## 2019-06-02 DIAGNOSIS — Z7984 Long term (current) use of oral hypoglycemic drugs: Secondary | ICD-10-CM | POA: Diagnosis not present

## 2019-06-02 DIAGNOSIS — Z1159 Encounter for screening for other viral diseases: Secondary | ICD-10-CM | POA: Diagnosis not present

## 2019-07-06 DIAGNOSIS — H1820 Unspecified corneal edema: Secondary | ICD-10-CM | POA: Diagnosis not present

## 2019-07-06 DIAGNOSIS — T86840 Corneal transplant rejection: Secondary | ICD-10-CM | POA: Diagnosis not present

## 2019-07-06 DIAGNOSIS — H40052 Ocular hypertension, left eye: Secondary | ICD-10-CM | POA: Diagnosis not present

## 2019-07-06 DIAGNOSIS — H26491 Other secondary cataract, right eye: Secondary | ICD-10-CM | POA: Diagnosis not present

## 2019-08-23 DIAGNOSIS — L84 Corns and callosities: Secondary | ICD-10-CM | POA: Diagnosis not present

## 2019-08-23 DIAGNOSIS — E1151 Type 2 diabetes mellitus with diabetic peripheral angiopathy without gangrene: Secondary | ICD-10-CM | POA: Diagnosis not present

## 2019-08-23 DIAGNOSIS — L602 Onychogryphosis: Secondary | ICD-10-CM | POA: Diagnosis not present

## 2019-08-30 ENCOUNTER — Ambulatory Visit (INDEPENDENT_AMBULATORY_CARE_PROVIDER_SITE_OTHER): Payer: Medicare Other

## 2019-08-30 ENCOUNTER — Encounter: Payer: Self-pay | Admitting: Orthopedic Surgery

## 2019-08-30 ENCOUNTER — Ambulatory Visit (INDEPENDENT_AMBULATORY_CARE_PROVIDER_SITE_OTHER): Payer: Medicare Other | Admitting: Orthopedic Surgery

## 2019-08-30 ENCOUNTER — Other Ambulatory Visit: Payer: Self-pay

## 2019-08-30 VITALS — Ht 67.0 in | Wt 158.0 lb

## 2019-08-30 DIAGNOSIS — M25552 Pain in left hip: Secondary | ICD-10-CM | POA: Diagnosis not present

## 2019-08-31 LAB — CBC WITH DIFFERENTIAL/PLATELET
Absolute Monocytes: 608 cells/uL (ref 200–950)
Basophils Absolute: 42 cells/uL (ref 0–200)
Basophils Relative: 0.8 %
Eosinophils Absolute: 177 cells/uL (ref 15–500)
Eosinophils Relative: 3.4 %
HCT: 46.4 % (ref 38.5–50.0)
Hemoglobin: 15.6 g/dL (ref 13.2–17.1)
Lymphs Abs: 2236 cells/uL (ref 850–3900)
MCH: 29.5 pg (ref 27.0–33.0)
MCHC: 33.6 g/dL (ref 32.0–36.0)
MCV: 87.7 fL (ref 80.0–100.0)
MPV: 10.8 fL (ref 7.5–12.5)
Monocytes Relative: 11.7 %
Neutro Abs: 2137 cells/uL (ref 1500–7800)
Neutrophils Relative %: 41.1 %
Platelets: 312 10*3/uL (ref 140–400)
RBC: 5.29 10*6/uL (ref 4.20–5.80)
RDW: 12.5 % (ref 11.0–15.0)
Total Lymphocyte: 43 %
WBC: 5.2 10*3/uL (ref 3.8–10.8)

## 2019-08-31 LAB — C-REACTIVE PROTEIN: CRP: 1.8 mg/L (ref <8.0)

## 2019-08-31 LAB — SEDIMENTATION RATE: Sed Rate: 6 mm/h (ref 0–20)

## 2019-08-31 NOTE — Progress Notes (Signed)
Please call patient with results. Thanks please call.  Labs look okay for infection.  Does not appear there is any infection present.

## 2019-09-01 ENCOUNTER — Other Ambulatory Visit: Payer: Self-pay

## 2019-09-04 ENCOUNTER — Encounter: Payer: Self-pay | Admitting: Orthopedic Surgery

## 2019-09-04 NOTE — Progress Notes (Signed)
Office Visit Note   Patient: [name]           Date of Birth: 1941-06-04           MRN: [id number] Visit Date: 08/30/2019 Requested by: Kathyrn Lass, [place],  [place] [address] PCP: Kathyrn Lass, MD  Subjective: Chief Complaint  Patient presents with  . Left Hip - Pain    HPI: [name] is a patient with left hip knee pain.'s been going on for a year.  Pain is getting worse.  Is having to use a cane and sometimes crutches.  Pain is worse at night.  Denies any fevers or chills.  Been taking ibuprofen for symptoms.  His wife is at home with him.  He does not have diabetes.  He does have a history of having bilateral knee infections treated successfully with open I&D.  This was approximately 7 to 10 years ago.              ROS: All systems reviewed are negative as they relate to the chief complaint within the history of present illness.  Patient denies  fevers or chills.   Assessment & Plan: Visit Diagnoses:  1. Pain in left hip     Plan: Impression is end-stage severe left hip arthritis.  I think he also has a component of bilateral knee arthritis.  His hip is the most symptomatic on exam today.  Plan is left total hip replacement.  Risk benefits are discussed including not limited to infection nerve vessel damage incomplete pain relief as well as potential for leg length discrepancy and dislocation.  Also is a risk of infection which is slightly higher in this patient based on his prior history.  At the time of this dictation CBC differential sed rate C-reactive protein ordered and returned and are normal.  Patient understands risk and benefits and to achieve a better quality of life wants to proceed with total hip replacement which I think is completely reasonable.  All questions answered  Follow-Up Instructions: No follow-ups on file.   Orders:  Orders Placed This Encounter  Procedures  . XR HIP UNILAT W OR W/O PELVIS 2-3 VIEWS LEFT  . CBC with  Differential  . Sed Rate (ESR)  . C-reactive protein   No orders of the defined types were placed in this encounter.     Procedures: No procedures performed   Clinical Data: No additional findings.  Objective: Vital Signs: Ht '5\' 7"'$  (1.702 m)   Wt 158 lb (71.7 kg)   BMI 24.75 kg/m   Physical Exam:   Constitutional: Patient appears well-developed HEENT:  Head: Normocephalic Eyes:EOM are normal Neck: Normal range of motion Cardiovascular: Normal rate Pulmonary/chest: Effort normal Neurologic: Patient is alert Skin: Skin is warm Psychiatric: Patient has normal mood and affect    Ortho Exam: Ortho exam demonstrates full active and passive range of motion of that right hip.  On the left-hand side he has significantly restricted and painful internal and external rotation along with abduction.  Pedal pulses palpable.  Slightly different leg length left hip versus right.  No other masses lymphadenopathy or skin changes noted in that hip region.  Both knees have no effusion and no warmth but mildly painful range of motion and crepitus.  Specialty Comments:  No specialty comments available.  Imaging: No results found.   PMFS History: Patient Active Problem List   Diagnosis Date Noted  . Protein-calorie malnutrition, severe ([place]) 04/28/2014  .  Acute blood loss anemia 04/28/2014  . Septic joint ([place]) 04/25/2014  . DVT of lower extremity, bilateral ([place]) 04/01/2014  . Bacterial infection of knee joint ([place]) 03/29/2014  . Essential hypertension 03/01/2014  . Chest pain 03/01/2014  . Diabetes mellitus type II, controlled ([place]) 03/01/2014  . Hyperlipidemia 03/01/2014   Past Medical History:  Diagnosis Date  . Attention deficit disorder   . Blind left eye   . Diabetes mellitus without complication ([place])   . Hyperlipidemia   . Hypertension     Family History  Problem Relation Age of Onset  . Heart attack Father   . Heart attack Sister   . Heart attack Brother      Past Surgical History:  Procedure Laterality Date  . EYE SURGERY Left    x3  . KNEE ARTHROSCOPY Bilateral 03/29/2014   Procedure: RIGHT KNEE ARTHROSCOPY AND DEBRIDEMENT, LEFT KNEE ASPIRATION,  LEFT KNEE ARTHROSCOPY AND DEBRIDEMENT.;  Surgeon: Meredith Pel, MD;  Location: [facility];  Service: Orthopedics;  Laterality: Bilateral;  RIGHT KNEE ARTHROSCOPY AND DEBRIDEMENT, LEFT KNEE ASPIRATION,  LEFT KNEE ARTHROSCOPY AND DEBRIDEMENT.  Marland Kitchen KNEE ARTHROTOMY Bilateral 04/26/2014   Procedure: KNEE ARTHROTOMY, SYNOVECTOMY, ANTIBIOTIC BEAD PLACEMENT.;  Surgeon: Meredith Pel, MD;  Location: [facility];  Service: Orthopedics;  Laterality: Bilateral;  . lipoma removal, right upper back    . TONSILLECTOMY     Social History   Occupational History  . Not on file  Tobacco Use  . Smoking status: Former Research scientist (life sciences)  . Smokeless tobacco: Never Used  Substance and Sexual Activity  . Alcohol use: No  . Drug use: No  . Sexual activity: Not on file

## 2019-09-06 ENCOUNTER — Encounter (HOSPITAL_COMMUNITY): Payer: Self-pay

## 2019-09-06 NOTE — Pre-Procedure Instructions (Signed)
[facility] [address] [place] [address] [place] [address] Phone: [phone] Fax: [phone]      Your procedure is scheduled on Tuesday, October 27th.  Report to [facility] Main Entrance "A" at 9:30 A.M., and check in at the Admitting office.  Call this number if you have problems the morning of surgery:  [phone]  Call [phone] if you have any questions prior to your surgery date Monday-Friday 8am-4pm    Remember:  Do not eat after midnight the night before your surgery  You may drink clear liquids until 8:30a.m. the morning of your surgery.   Clear liquids allowed are: Water, Non-Citrus Juices (without pulp), Carbonated Beverages, Clear Tea, Black Coffee Only, and Gatorade.  Please complete your PRE-SURGERY ENSURE that was provided to you by 8:30a.m. the morning of surgery.  Please, if able, drink it in one setting. DO NOT SIP.   Enhanced Recovery after Surgery for Orthopedics Enhanced Recovery after Surgery is a protocol used to improve the stress on your body and your recovery after surgery.  Patient Instructions  . The night before surgery:  o No food after midnight. ONLY clear liquids after midnight  .  Marland Kitchen The day of surgery (if you do NOT have diabetes):  o Drink ONE (1) Pre-Surgery Clear Ensure as directed.   o This drink was given to you during your hospital  pre-op appointment visit. o The pre-op nurse will instruct you on the time to drink the  Pre-Surgery Ensure depending on your surgery time. o Finish the drink at the designated time by the pre-op nurse.  o Nothing else to drink after completing the  Pre-Surgery Clear Ensure.     Take these medicines the morning of surgery with A SIP OF WATER  brimonidine (ALPHAGAN) timolol (TIMOPTIC)  sodium chloride (MURO 128)  simvastatin (ZOCOR)  ALPRAZolam (XANAX) -as needed for anxiety  Follow your surgeon's instructions on when to stop  Aspirin.  If no instructions were given by your surgeon then you will need to call the office to get those instructions.    As of today, STOP taking any Aspirin (unless otherwise instructed by your surgeon), Aleve, Naproxen, Ibuprofen, Motrin, Advil, Goody's, BC's, all herbal medications, fish oil, and all vitamins.    The Morning of Surgery  Do not wear jewelry, make-up or nail polish.  Do not wear lotions, powders, or perfumes/colognes, or deodorant  Do not shave 48 hours prior to surgery.  Men may shave face and neck.  Do not bring valuables to the hospital.  [facility] is not responsible for any belongings or valuables.  If you are a smoker, DO NOT Smoke 24 hours prior to surgery IF you wear a CPAP at night please bring your mask, tubing, and machine the morning of surgery   Remember that you must have someone to transport you home after your surgery, and remain with you for 24 hours if you are discharged the same day.   Contacts, glasses, hearing aids, dentures or bridgework may not be worn into surgery.    Leave your suitcase in the car.  After surgery it may be brought to your room.  For patients admitted to the hospital, discharge time will be determined by your treatment team.  Patients discharged the day of surgery will not be allowed to drive home.    Special instructions:   [facility]- Preparing For Surgery  Before surgery, you can play an  important role. Because skin is not sterile, your skin needs to be as free of germs as possible. You can reduce the number of germs on your skin by washing with CHG (chlorahexidine gluconate) Soap before surgery.  CHG is an antiseptic cleaner which kills germs and bonds with the skin to continue killing germs even after washing.    Oral Hygiene is also important to reduce your risk of infection.  Remember - BRUSH YOUR TEETH THE MORNING OF SURGERY WITH YOUR REGULAR TOOTHPASTE  Please do not use if you have an allergy to CHG or  antibacterial soaps. If your skin becomes reddened/irritated stop using the CHG.  Do not shave (including legs and underarms) for at least 48 hours prior to first CHG shower. It is OK to shave your face.  Please follow these instructions carefully.   1. Shower the NIGHT BEFORE SURGERY and the MORNING OF SURGERY with CHG Soap.   2. If you chose to wash your hair, wash your hair first as usual with your normal shampoo.  3. After you shampoo, rinse your hair and body thoroughly to remove the shampoo.  4. Use CHG as you would any other liquid soap. You can apply CHG directly to the skin and wash gently with a scrungie or a clean washcloth.   5. Apply the CHG Soap to your body ONLY FROM THE NECK DOWN.  Do not use on open wounds or open sores. Avoid contact with your eyes, ears, mouth and genitals (private parts). Wash Face and genitals (private parts)  with your normal soap.   6. Wash thoroughly, paying special attention to the area where your surgery will be performed.  7. Thoroughly rinse your body with warm water from the neck down.  8. DO NOT shower/wash with your normal soap after using and rinsing off the CHG Soap.  9. Pat yourself dry with a CLEAN TOWEL.  10. Wear CLEAN PAJAMAS to bed the night before surgery, wear comfortable clothes the morning of surgery  11. Place CLEAN SHEETS on your bed the night of your first shower and DO NOT SLEEP WITH PETS.    Day of Surgery:  Do not apply any deodorants/lotions. Please shower the morning of surgery with the CHG soap  Please wear clean clothes to the hospital/surgery center.   Remember to brush your teeth WITH YOUR REGULAR TOOTHPASTE.   Please read over the following fact sheets that you were given.

## 2019-09-07 ENCOUNTER — Encounter (HOSPITAL_COMMUNITY)
Admission: RE | Admit: 2019-09-07 | Discharge: 2019-09-07 | Disposition: A | Discharge status: Home / Self Care | Payer: Medicare Other | Source: Ambulatory Visit | Attending: Orthopedic Surgery | Admitting: Orthopedic Surgery

## 2019-09-07 ENCOUNTER — Other Ambulatory Visit: Payer: Self-pay

## 2019-09-07 ENCOUNTER — Encounter (HOSPITAL_COMMUNITY): Payer: Self-pay

## 2019-09-07 DIAGNOSIS — E785 Hyperlipidemia, unspecified: Secondary | ICD-10-CM | POA: Diagnosis not present

## 2019-09-07 DIAGNOSIS — I10 Essential (primary) hypertension: Secondary | ICD-10-CM | POA: Diagnosis not present

## 2019-09-07 DIAGNOSIS — M1612 Unilateral primary osteoarthritis, left hip: Secondary | ICD-10-CM | POA: Insufficient documentation

## 2019-09-07 DIAGNOSIS — E119 Type 2 diabetes mellitus without complications: Secondary | ICD-10-CM | POA: Insufficient documentation

## 2019-09-07 DIAGNOSIS — Z01818 Encounter for other preprocedural examination: Secondary | ICD-10-CM | POA: Insufficient documentation

## 2019-09-07 DIAGNOSIS — Z86718 Personal history of other venous thrombosis and embolism: Secondary | ICD-10-CM | POA: Insufficient documentation

## 2019-09-07 LAB — BASIC METABOLIC PANEL
Anion gap: 8 (ref 5–15)
BUN: 11 mg/dL (ref 8–23)
CO2: 24 mmol/L (ref 22–32)
Calcium: 9.3 mg/dL (ref 8.9–10.3)
Chloride: 105 mmol/L (ref 98–111)
Creatinine, Ser: 1.03 mg/dL (ref 0.61–1.24)
GFR calc Af Amer: >60 mL/min (ref >60)
GFR calc non Af Amer: >60 mL/min (ref >60)
Glucose, Bld: 121 mg/dL — ABNORMAL HIGH (ref 70–99)
Potassium: 4.1 mmol/L (ref 3.5–5.1)
Sodium: 137 mmol/L (ref 135–145)

## 2019-09-07 LAB — URINALYSIS, ROUTINE W REFLEX MICROSCOPIC
Bilirubin Urine: NEGATIVE
Glucose, UA: NEGATIVE mg/dL
Hgb urine dipstick: NEGATIVE
Ketones, ur: NEGATIVE mg/dL
Nitrite: POSITIVE — AB
Protein, ur: NEGATIVE mg/dL
Specific Gravity, Urine: 1.013 (ref 1.005–1.030)
pH: 6 (ref 5.0–8.0)

## 2019-09-07 LAB — CBC
HCT: 44.9 % (ref 39.0–52.0)
Hemoglobin: 14.6 g/dL (ref 13.0–17.0)
MCH: 29.8 pg (ref 26.0–34.0)
MCHC: 32.5 g/dL (ref 30.0–36.0)
MCV: 91.6 fL (ref 80.0–100.0)
Platelets: 270 10*3/uL (ref 150–400)
RBC: 4.9 MIL/uL (ref 4.22–5.81)
RDW: 12.9 % (ref 11.5–15.5)
WBC: 4 10*3/uL (ref 4.0–10.5)
nRBC: 0 % (ref 0.0–0.2)

## 2019-09-07 LAB — SURGICAL PCR SCREEN
MRSA, PCR: NEGATIVE
Staphylococcus aureus: NEGATIVE

## 2019-09-07 LAB — GLUCOSE, CAPILLARY: Glucose-Capillary: 113 mg/dL — ABNORMAL HIGH (ref 70–99)

## 2019-09-07 LAB — HEMOGLOBIN A1C
Hgb A1c MFr Bld: 6.6 % — ABNORMAL HIGH (ref 4.8–5.6)
Mean Plasma Glucose: 142.72 mg/dL

## 2019-09-07 NOTE — Progress Notes (Signed)
PCP - Dr. Kathyrn Lass Cardiologist - Dr. Delphina Cahill not seen since 2015, no cardiac issues since then.  PPM/ICD - N/A Device Orders -  Rep Notified -   Chest x-ray - N/A EKG - 09/07/19 Stress Test - 2015 ECHO - 2015 Cardiac Cath -denies   Sleep Study - denies CPAP -denies   Fasting Blood Sugar -90-100  Checks Blood Sugar 3 times a week  Blood Thinner Instructions:N/A Aspirin Instructions:hold 7 days  ERAS Protcol -Yes PRE-SURGERY Ensure or G2- 1 G2 given to patient.   COVID TEST- 09/10/19   Anesthesia review: Yes  Patient denies shortness of breath, fever, cough and chest pain at PAT appointment   All instructions explained to the patient, with a verbal understanding of the material. Patient agrees to go over the instructions while at home for a better understanding. Patient also instructed to self quarantine after being tested for COVID-19. The opportunity to ask questions was provided.   Coronavirus Screening  Have you experienced the following symptoms:  Cough yes/no: No Fever (>100.4F)  yes/no: No Runny nose yes/no: No Sore throat yes/no: No Difficulty breathing/shortness of breath  yes/no: No  Have you or a family member traveled in the last 14 days and where? yes/no: No   If the patient indicates "YES" to the above questions, their PAT will be rescheduled to limit the exposure to others and, the surgeon will be notified. THE PATIENT WILL NEED TO BE ASYMPTOMATIC FOR 14 DAYS.   If the patient is not experiencing any of these symptoms, the PAT nurse will instruct them to NOT bring anyone with them to their appointment since they may have these symptoms or traveled as well.   Please remind your patients and families that hospital visitation restrictions are in effect and the importance of the restrictions.

## 2019-09-07 NOTE — Progress Notes (Addendum)
  WHAT DO I DO ABOUT MY DIABETES MEDICATION?   Marland Kitchen Do not take oral diabetes medicines (pills) the morning of surgery. This includes: METFORMIN XR 500mg     How to Manage Your Diabetes Before and After Surgery  Why is it important to control my blood sugar before and after surgery? . Improving blood sugar levels before and after surgery helps healing and can limit problems. . A way of improving blood sugar control is eating a healthy diet by: o  Eating less sugar and carbohydrates o  Increasing activity/exercise o  Talking with your doctor about reaching your blood sugar goals . High blood sugars (greater than 180 mg/dL) can raise your risk of infections and slow your recovery, so you will need to focus on controlling your diabetes during the weeks before surgery. . Make sure that the doctor who takes care of your diabetes knows about your planned surgery including the date and location.  How do I manage my blood sugar before surgery? . Check your blood sugar at least 4 times a day, starting 2 days before surgery, to make sure that the level is not too high or low. o Check your blood sugar the morning of your surgery when you wake up and every 2 hours until you get to the Short Stay unit. . If your blood sugar is less than 70 mg/dL, you will need to treat for low blood sugar: o Do not take insulin. o Treat a low blood sugar (less than 70 mg/dL) with  cup of clear juice (cranberry or apple), 4 glucose tablets, OR glucose gel. o Recheck blood sugar in 15 minutes after treatment (to make sure it is greater than 70 mg/dL). If your blood sugar is not greater than 70 mg/dL on recheck, call [phone] for further instructions. . Report your blood sugar to the short stay nurse when you get to Short Stay.  . If you are admitted to the hospital after surgery: o Your blood sugar will be checked by the staff and you will probably be given insulin after surgery (instead of oral diabetes medicines) to  make sure you have good blood sugar levels. o The goal for blood sugar control after surgery is 80-180 mg/dL.

## 2019-09-07 NOTE — Progress Notes (Signed)
Cheryl at Dr. Randel Pigg office notified of pt's abnormal UA.   Jacqlyn Larsen, RN

## 2019-09-08 NOTE — Progress Notes (Signed)
Anesthesia Chart Review: Pt seen by cardiology in 2015 for chest discomfort and had nuclear stress that was low risk, nonischemic. He also had a  TTE in 2015 during hospitalization for septic arthritis which showed normal LVEF, no wall motion abnormalities.  Abnormal preop UA with growth on culture, result called to Dr. Randel Pigg office.  TTE 2015: Study Conclusions   - Left ventricle: The cavity size was normal. Systolic function was  normal. The estimated ejection fraction was in the range of 60%  to 65%. Wall motion was normal; there were no regional wall  motion abnormalities.  - Pulmonary arteries: PA peak pressure: 49 mm Hg (S).  - Impressions: Overall very poor image quality.   Impressions:   - Overall very poor image quality.    Nuclear stress 2015: Overall Impression:  Low risk stress nuclear study without reversible ischemia. Mild bowel attenuation artifact inferiorly.  LV Ejection Fraction: 73%.  LV Wall Motion:  NL LV Function; NL Wall Motion    [name] [facility] Short Stay Center/Anesthesiology Phone [phone] 09/08/2019 3:41 PM

## 2019-09-08 NOTE — Anesthesia Preprocedure Evaluation (Addendum)
Anesthesia Evaluation  Patient identified by MRN, date of birth, ID band Patient awake    Reviewed: Allergy & Precautions, NPO status , Patient's Chart, lab work & pertinent test results, reviewed documented beta blocker date and time   Airway Mallampati: II  TM Distance: >3 FB Neck ROM: Full    Dental no notable dental hx. (+) Edentulous Upper, Edentulous Lower   Pulmonary former smoker,    Pulmonary exam normal breath sounds clear to auscultation       Cardiovascular hypertension, Pt. on medications Normal cardiovascular exam Rhythm:Regular Rate:Normal     Neuro/Psych PSYCHIATRIC DISORDERS ADDnegative neurological ROS     GI/Hepatic negative GI ROS, Neg liver ROS,   Endo/Other  diabetes, Well Controlled, Type 2Hyperlipidemia  Renal/GU negative Renal ROS  negative genitourinary   Musculoskeletal  (+) Arthritis , Osteoarthritis,    Abdominal   Peds  Hematology  (+) anemia ,   Anesthesia Other Findings   Reproductive/Obstetrics                            Anesthesia Physical Anesthesia Plan  ASA: III  Anesthesia Plan: Spinal   Post-op Pain Management:    Induction:   PONV Risk Score and Plan: 2 and Ondansetron, Treatment may vary due to age or medical condition and Propofol infusion  Airway Management Planned: Natural Airway, Nasal Cannula and Simple Face Mask  Additional Equipment:   Intra-op Plan:   Post-operative Plan:   Informed Consent: I have reviewed the patients History and Physical, chart, labs and discussed the procedure including the risks, benefits and alternatives for the proposed anesthesia with the patient or authorized representative who has indicated his/her understanding and acceptance.       Plan Discussed with: Surgeon and CRNA  Anesthesia Plan Comments: (Pt seen by cardiology in 2015 for chest discomfort and had nuclear stress that was low risk,  nonischemic. He also had a  TTE in 2015 during hospitalization for septic arthritis which showed normal LVEF, no wall motion abnormalities.  Abnormal preop UA with growth on culture, result called to Dr. Randel Pigg office.  TTE 2015: Study Conclusions   - Left ventricle: The cavity size was normal. Systolic function was  normal. The estimated ejection fraction was in the range of 60%  to 65%. Wall motion was normal; there were no regional wall  motion abnormalities.  - Pulmonary arteries: PA peak pressure: 49 mm Hg (S).  - Impressions: Overall very poor image quality.   Impressions:   - Overall very poor image quality.    Nuclear stress 2015: Overall Impression:  Low risk stress nuclear study without reversible ischemia. Mild bowel attenuation artifact inferiorly.  LV Ejection Fraction: 73%.  LV Wall Motion:  NL LV Function; NL Wall Motion)      Anesthesia Quick Evaluation

## 2019-09-09 ENCOUNTER — Other Ambulatory Visit: Payer: Self-pay

## 2019-09-09 ENCOUNTER — Telehealth: Payer: Self-pay | Admitting: Radiology

## 2019-09-09 ENCOUNTER — Telehealth: Payer: Self-pay | Admitting: Orthopedic Surgery

## 2019-09-09 ENCOUNTER — Ambulatory Visit (INDEPENDENT_AMBULATORY_CARE_PROVIDER_SITE_OTHER): Payer: Medicare Other

## 2019-09-09 DIAGNOSIS — R8290 Unspecified abnormal findings in urine: Secondary | ICD-10-CM

## 2019-09-09 LAB — URINE CULTURE

## 2019-09-09 NOTE — Telephone Encounter (Signed)
[name] had patient return today to office for repeat UA and culture. Should be pending new results.

## 2019-09-09 NOTE — Telephone Encounter (Signed)
FYIJeneen Rinks w/[facility] calling to make sure you see the labs for this patient.  Patient's urinalysis grew out some bacteria.  Patient is scheduled 09-14-19.

## 2019-09-09 NOTE — Progress Notes (Signed)
Repeat UA and UC collected and sent, due to abnormal findings on his preop urine studies - per Annie Main, PA.

## 2019-09-09 NOTE — Telephone Encounter (Signed)
I called patient-Pre-op UA to be repeated per Lurena Joiner. Send for cultures. Patient made nurse appt for this afternoon at 1pm.

## 2019-09-09 NOTE — Addendum Note (Signed)
Addended by: Marlyne Beards on: 09/09/2019 03:13 PM   Modules accepted: Orders

## 2019-09-10 ENCOUNTER — Other Ambulatory Visit (HOSPITAL_COMMUNITY): Payer: Medicare Other

## 2019-09-10 ENCOUNTER — Other Ambulatory Visit (HOSPITAL_COMMUNITY)
Admission: RE | Admit: 2019-09-10 | Discharge: 2019-09-10 | Disposition: A | Discharge status: Home / Self Care | Payer: Medicare Other | Source: Ambulatory Visit | Attending: Orthopedic Surgery | Admitting: Orthopedic Surgery

## 2019-09-10 DIAGNOSIS — Z01812 Encounter for preprocedural laboratory examination: Secondary | ICD-10-CM | POA: Diagnosis not present

## 2019-09-10 DIAGNOSIS — Z20828 Contact with and (suspected) exposure to other viral communicable diseases: Secondary | ICD-10-CM | POA: Insufficient documentation

## 2019-09-11 LAB — URINALYSIS, ROUTINE W REFLEX MICROSCOPIC
Bilirubin Urine: NEGATIVE
Glucose, UA: NEGATIVE
Hgb urine dipstick: NEGATIVE
Hyaline Cast: NONE SEEN /LPF
Ketones, ur: NEGATIVE
Nitrite: POSITIVE — AB
Protein, ur: NEGATIVE
RBC / HPF: NONE SEEN /HPF (ref 0–2)
Specific Gravity, Urine: 1.015 (ref 1.001–1.03)
pH: 5.5 (ref 5.0–8.0)

## 2019-09-11 LAB — NOVEL CORONAVIRUS, NAA (HOSP ORDER, SEND-OUT TO REF LAB; TAT 18-24 HRS): SARS-CoV-2, NAA: NOT DETECTED

## 2019-09-11 LAB — URINE CULTURE
MICRO NUMBER:: 1018841
SPECIMEN QUALITY:: ADEQUATE

## 2019-09-12 ENCOUNTER — Other Ambulatory Visit: Payer: Self-pay | Admitting: Surgical

## 2019-09-12 MED ORDER — AMPICILLIN 250 MG PO CAPS: 500.0000 mg | ORAL_CAPSULE | Freq: Four times a day (QID) | ORAL | 0 refills | Status: DC

## 2019-09-13 ENCOUNTER — Telehealth: Payer: Self-pay | Admitting: *Deleted

## 2019-09-13 NOTE — Telephone Encounter (Signed)
Ortho bundle pre-op call completed. 

## 2019-09-13 NOTE — Care Plan (Signed)
RNCM call to patient to discuss Ortho bundle. Spoke with patient and his wife for all information regarding bundle. Patient has a wife that will be available to help after discharge from hospital. Will need a FWW and [employer]in[employer] DME. Referral made to [facility]. Anticipate home health PT at discharge after short hospital stay. Choice provided. Referral made to [facility] to liaison. Will continue to assess for CM needs.

## 2019-09-14 ENCOUNTER — Inpatient Hospital Stay (HOSPITAL_COMMUNITY): Payer: Medicare Other

## 2019-09-14 ENCOUNTER — Ambulatory Visit (HOSPITAL_COMMUNITY): Payer: Medicare Other | Admitting: Certified Registered Nurse Anesthetist

## 2019-09-14 ENCOUNTER — Ambulatory Visit (HOSPITAL_COMMUNITY): Payer: Medicare Other | Admitting: Physician Assistant

## 2019-09-14 ENCOUNTER — Ambulatory Visit (HOSPITAL_COMMUNITY): Payer: Medicare Other

## 2019-09-14 ENCOUNTER — Encounter (HOSPITAL_COMMUNITY)
Admission: RE | Disposition: A | Discharge status: Home Health | Payer: Self-pay | Source: Ambulatory Visit | Attending: Orthopedic Surgery

## 2019-09-14 ENCOUNTER — Other Ambulatory Visit: Payer: Self-pay

## 2019-09-14 ENCOUNTER — Encounter (HOSPITAL_COMMUNITY): Payer: Self-pay | Admitting: Anesthesiology

## 2019-09-14 ENCOUNTER — Inpatient Hospital Stay (HOSPITAL_COMMUNITY)
Admission: RE | Admit: 2019-09-14 | Discharge: 2019-09-17 | DRG: 470 | Disposition: A | Discharge status: Home Health | Payer: Medicare Other | Source: Ambulatory Visit | Attending: Orthopedic Surgery | Admitting: Orthopedic Surgery

## 2019-09-14 DIAGNOSIS — E119 Type 2 diabetes mellitus without complications: Secondary | ICD-10-CM | POA: Diagnosis not present

## 2019-09-14 DIAGNOSIS — Z86718 Personal history of other venous thrombosis and embolism: Secondary | ICD-10-CM

## 2019-09-14 DIAGNOSIS — Z7982 Long term (current) use of aspirin: Secondary | ICD-10-CM | POA: Diagnosis not present

## 2019-09-14 DIAGNOSIS — Z6827 Body mass index (BMI) 27.0-27.9, adult: Secondary | ICD-10-CM

## 2019-09-14 DIAGNOSIS — Z466 Encounter for fitting and adjustment of urinary device: Secondary | ICD-10-CM | POA: Diagnosis not present

## 2019-09-14 DIAGNOSIS — E669 Obesity, unspecified: Secondary | ICD-10-CM | POA: Diagnosis present

## 2019-09-14 DIAGNOSIS — Z419 Encounter for procedure for purposes other than remedying health state, unspecified: Secondary | ICD-10-CM | POA: Diagnosis not present

## 2019-09-14 DIAGNOSIS — I491 Atrial premature depolarization: Secondary | ICD-10-CM | POA: Diagnosis not present

## 2019-09-14 DIAGNOSIS — Z20828 Contact with and (suspected) exposure to other viral communicable diseases: Secondary | ICD-10-CM | POA: Diagnosis present

## 2019-09-14 DIAGNOSIS — Z833 Family history of diabetes mellitus: Secondary | ICD-10-CM

## 2019-09-14 DIAGNOSIS — H5462 Unqualified visual loss, left eye, normal vision right eye: Secondary | ICD-10-CM | POA: Diagnosis present

## 2019-09-14 DIAGNOSIS — R339 Retention of urine, unspecified: Secondary | ICD-10-CM | POA: Diagnosis not present

## 2019-09-14 DIAGNOSIS — Z87891 Personal history of nicotine dependence: Secondary | ICD-10-CM | POA: Diagnosis not present

## 2019-09-14 DIAGNOSIS — Z96642 Presence of left artificial hip joint: Secondary | ICD-10-CM | POA: Diagnosis not present

## 2019-09-14 DIAGNOSIS — R410 Disorientation, unspecified: Secondary | ICD-10-CM | POA: Diagnosis not present

## 2019-09-14 DIAGNOSIS — Z8249 Family history of ischemic heart disease and other diseases of the circulatory system: Secondary | ICD-10-CM | POA: Diagnosis not present

## 2019-09-14 DIAGNOSIS — H409 Unspecified glaucoma: Secondary | ICD-10-CM | POA: Diagnosis not present

## 2019-09-14 DIAGNOSIS — I10 Essential (primary) hypertension: Secondary | ICD-10-CM | POA: Diagnosis present

## 2019-09-14 DIAGNOSIS — Z96649 Presence of unspecified artificial hip joint: Secondary | ICD-10-CM | POA: Diagnosis not present

## 2019-09-14 DIAGNOSIS — N35011 Post-traumatic bulbous urethral stricture: Secondary | ICD-10-CM | POA: Diagnosis not present

## 2019-09-14 DIAGNOSIS — N35912 Unspecified bulbous urethral stricture, male: Secondary | ICD-10-CM | POA: Diagnosis present

## 2019-09-14 DIAGNOSIS — Z7901 Long term (current) use of anticoagulants: Secondary | ICD-10-CM

## 2019-09-14 DIAGNOSIS — B952 Enterococcus as the cause of diseases classified elsewhere: Secondary | ICD-10-CM | POA: Diagnosis present

## 2019-09-14 DIAGNOSIS — M1612 Unilateral primary osteoarthritis, left hip: Principal | ICD-10-CM | POA: Diagnosis present

## 2019-09-14 DIAGNOSIS — Z79899 Other long term (current) drug therapy: Secondary | ICD-10-CM | POA: Diagnosis not present

## 2019-09-14 DIAGNOSIS — N390 Urinary tract infection, site not specified: Secondary | ICD-10-CM | POA: Diagnosis present

## 2019-09-14 DIAGNOSIS — R001 Bradycardia, unspecified: Secondary | ICD-10-CM

## 2019-09-14 DIAGNOSIS — E785 Hyperlipidemia, unspecified: Secondary | ICD-10-CM | POA: Diagnosis not present

## 2019-09-14 DIAGNOSIS — Z471 Aftercare following joint replacement surgery: Secondary | ICD-10-CM | POA: Diagnosis not present

## 2019-09-14 DIAGNOSIS — R338 Other retention of urine: Secondary | ICD-10-CM

## 2019-09-14 HISTORY — DX: Unspecified osteoarthritis, unspecified site: M19.90

## 2019-09-14 HISTORY — PX: TOTAL HIP ARTHROPLASTY: SHX124

## 2019-09-14 LAB — GLUCOSE, CAPILLARY
Glucose-Capillary: 129 mg/dL — ABNORMAL HIGH (ref 70–99)
Glucose-Capillary: 118 mg/dL — ABNORMAL HIGH (ref 70–99)
Glucose-Capillary: 148 mg/dL — ABNORMAL HIGH (ref 70–99)

## 2019-09-14 LAB — CBC
HCT: 44.9 % (ref 39.0–52.0)
Hemoglobin: 14.5 g/dL (ref 13.0–17.0)
MCH: 30.3 pg (ref 26.0–34.0)
MCHC: 32.3 g/dL (ref 30.0–36.0)
MCV: 93.7 fL (ref 80.0–100.0)
Platelets: 254 10*3/uL (ref 150–400)
RBC: 4.79 MIL/uL (ref 4.22–5.81)
RDW: 12.7 % (ref 11.5–15.5)
WBC: 16.1 10*3/uL — ABNORMAL HIGH (ref 4.0–10.5)
nRBC: 0 % (ref 0.0–0.2)

## 2019-09-14 LAB — COMPREHENSIVE METABOLIC PANEL
ALT: 16 U/L (ref 0–44)
AST: 25 U/L (ref 15–41)
Albumin: 3.6 g/dL (ref 3.5–5.0)
Alkaline Phosphatase: 42 U/L (ref 38–126)
Anion gap: 12 (ref 5–15)
BUN: 10 mg/dL (ref 8–23)
CO2: 20 mmol/L — ABNORMAL LOW (ref 22–32)
Calcium: 9 mg/dL (ref 8.9–10.3)
Chloride: 102 mmol/L (ref 98–111)
Creatinine, Ser: 1.2 mg/dL (ref 0.61–1.24)
GFR calc Af Amer: >60 mL/min (ref >60)
GFR calc non Af Amer: 58 mL/min — ABNORMAL LOW (ref >60)
Glucose, Bld: 169 mg/dL — ABNORMAL HIGH (ref 70–99)
Potassium: 3.8 mmol/L (ref 3.5–5.1)
Sodium: 134 mmol/L — ABNORMAL LOW (ref 135–145)
Total Bilirubin: 1 mg/dL (ref 0.3–1.2)
Total Protein: 6.5 g/dL (ref 6.5–8.1)

## 2019-09-14 LAB — TROPONIN I (HIGH SENSITIVITY): Troponin I (High Sensitivity): 14 ng/L (ref <18)

## 2019-09-14 LAB — LIPASE, BLOOD: Lipase: 23 U/L (ref 11–51)

## 2019-09-14 SURGERY — ARTHROPLASTY, HIP, TOTAL, ANTERIOR APPROACH
Anesthesia: Spinal | Site: Hip | Laterality: Left

## 2019-09-14 MED ORDER — FENTANYL CITRATE (PF) 100 MCG/2ML IJ SOLN
INTRAMUSCULAR | Status: AC
  Filled 2019-09-14: qty 2

## 2019-09-14 MED ORDER — CEFAZOLIN SODIUM-DEXTROSE 2-4 GM/100ML-% IV SOLN
INTRAVENOUS | Status: AC
  Filled 2019-09-14: qty 100

## 2019-09-14 MED ORDER — EPHEDRINE SULFATE-NACL 50-0.9 MG/10ML-% IV SOSY
PREFILLED_SYRINGE | INTRAVENOUS | Status: DC | PRN
  Administered 2019-09-14 (×2): 5 mg via INTRAVENOUS

## 2019-09-14 MED ORDER — FENTANYL CITRATE (PF) 100 MCG/2ML IJ SOLN
25.0000 ug | INTRAMUSCULAR | Status: DC | PRN
  Administered 2019-09-14 (×2): 25 ug via INTRAVENOUS

## 2019-09-14 MED ORDER — VANCOMYCIN HCL 1000 MG IV SOLR
INTRAVENOUS | Status: AC
  Filled 2019-09-14: qty 1000

## 2019-09-14 MED ORDER — TIMOLOL MALEATE 0.5 % OP SOLN
1.0000 [drp] | Freq: Two times a day (BID) | OPHTHALMIC | Status: DC
  Administered 2019-09-14 – 2019-09-17 (×5): 1 [drp] via OPHTHALMIC
  Filled 2019-09-14: qty 5

## 2019-09-14 MED ORDER — ONDANSETRON HCL 4 MG/2ML IJ SOLN: 4.0000 mg | Freq: Four times a day (QID) | INTRAMUSCULAR | Status: DC | PRN

## 2019-09-14 MED ORDER — BUPIVACAINE IN DEXTROSE 0.75-8.25 % IT SOLN
INTRATHECAL | Status: DC | PRN
  Administered 2019-09-14: 2 mL via INTRATHECAL

## 2019-09-14 MED ORDER — RIVAROXABAN 20 MG PO TABS: 20.0000 mg | ORAL_TABLET | Freq: Every day | ORAL | Status: DC

## 2019-09-14 MED ORDER — FENTANYL CITRATE (PF) 100 MCG/2ML IJ SOLN
INTRAMUSCULAR | Status: DC | PRN
  Administered 2019-09-14: 50 ug via INTRAVENOUS

## 2019-09-14 MED ORDER — METHOCARBAMOL 1000 MG/10ML IJ SOLN
500.0000 mg | Freq: Four times a day (QID) | INTRAVENOUS | Status: DC | PRN
  Filled 2019-09-14: qty 5

## 2019-09-14 MED ORDER — METHOCARBAMOL 500 MG PO TABS
500.0000 mg | ORAL_TABLET | Freq: Four times a day (QID) | ORAL | Status: DC | PRN
  Administered 2019-09-14 – 2019-09-17 (×5): 500 mg via ORAL
  Filled 2019-09-14 (×5): qty 1

## 2019-09-14 MED ORDER — POVIDONE-IODINE 10 % EX SWAB
2.0000 "application " | Freq: Once | CUTANEOUS | Status: AC
  Administered 2019-09-14: 2 via TOPICAL

## 2019-09-14 MED ORDER — GLYCOPYRROLATE PF 0.2 MG/ML IJ SOSY
PREFILLED_SYRINGE | INTRAMUSCULAR | Status: DC | PRN
  Administered 2019-09-14 (×3): .1 mg via INTRAVENOUS

## 2019-09-14 MED ORDER — OXYCODONE HCL 5 MG PO TABS
5.0000 mg | ORAL_TABLET | ORAL | Status: DC | PRN
  Administered 2019-09-14 – 2019-09-17 (×4): 10 mg via ORAL
  Filled 2019-09-14 (×5): qty 2

## 2019-09-14 MED ORDER — STERILE WATER FOR IRRIGATION IR SOLN
Status: DC | PRN
  Administered 2019-09-14: 1000 mL

## 2019-09-14 MED ORDER — BRIMONIDINE TARTRATE 0.2 % OP SOLN
1.0000 [drp] | Freq: Three times a day (TID) | OPHTHALMIC | Status: DC
  Administered 2019-09-14 – 2019-09-17 (×8): 1 [drp] via OPHTHALMIC
  Filled 2019-09-14: qty 5

## 2019-09-14 MED ORDER — INSULIN ASPART 100 UNIT/ML ~~LOC~~ SOLN: 0.0000 [IU] | Freq: Every day | SUBCUTANEOUS | Status: DC

## 2019-09-14 MED ORDER — IRRISEPT - 450ML BOTTLE WITH 0.05% CHG IN STERILE WATER, USP 99.95% OPTIME
TOPICAL | Status: DC | PRN
  Administered 2019-09-14: 450 mL

## 2019-09-14 MED ORDER — SODIUM CHLORIDE 0.9 % IR SOLN
Status: DC | PRN
  Administered 2019-09-14: 3000 mL

## 2019-09-14 MED ORDER — FENTANYL CITRATE (PF) 250 MCG/5ML IJ SOLN
INTRAMUSCULAR | Status: AC
  Filled 2019-09-14: qty 5

## 2019-09-14 MED ORDER — LACTATED RINGERS IV SOLN
INTRAVENOUS | Status: DC | PRN
  Administered 2019-09-14 (×2): via INTRAVENOUS

## 2019-09-14 MED ORDER — CEFAZOLIN SODIUM-DEXTROSE 2-4 GM/100ML-% IV SOLN
2.0000 g | Freq: Four times a day (QID) | INTRAVENOUS | Status: AC
  Administered 2019-09-14 – 2019-09-15 (×4): 2 g via INTRAVENOUS
  Filled 2019-09-14 (×4): qty 100

## 2019-09-14 MED ORDER — TRAMADOL HCL 50 MG PO TABS
50.0000 mg | ORAL_TABLET | Freq: Four times a day (QID) | ORAL | Status: DC
  Administered 2019-09-14 – 2019-09-17 (×10): 50 mg via ORAL
  Filled 2019-09-14 (×11): qty 1

## 2019-09-14 MED ORDER — INSULIN ASPART 100 UNIT/ML ~~LOC~~ SOLN: 0.0000 [IU] | Freq: Three times a day (TID) | SUBCUTANEOUS | Status: DC

## 2019-09-14 MED ORDER — ONDANSETRON HCL 4 MG PO TABS: 4.0000 mg | ORAL_TABLET | Freq: Four times a day (QID) | ORAL | Status: DC | PRN

## 2019-09-14 MED ORDER — AMOXICILLIN 500 MG PO CAPS
500.0000 mg | ORAL_CAPSULE | Freq: Three times a day (TID) | ORAL | Status: DC
  Administered 2019-09-14 – 2019-09-17 (×9): 500 mg via ORAL
  Filled 2019-09-14 (×11): qty 1

## 2019-09-14 MED ORDER — SODIUM CHLORIDE 0.9 % IV SOLN
INTRAVENOUS | Status: DC | PRN
  Administered 2019-09-14: 11:00:00 25 ug/min via INTRAVENOUS

## 2019-09-14 MED ORDER — ONDANSETRON HCL 4 MG/2ML IJ SOLN
INTRAMUSCULAR | Status: DC | PRN
  Administered 2019-09-14: 4 mg via INTRAVENOUS

## 2019-09-14 MED ORDER — PROPOFOL 10 MG/ML IV BOLUS
INTRAVENOUS | Status: AC
  Filled 2019-09-14: qty 20

## 2019-09-14 MED ORDER — METOCLOPRAMIDE HCL 5 MG PO TABS: 5.0000 mg | ORAL_TABLET | Freq: Three times a day (TID) | ORAL | Status: DC | PRN

## 2019-09-14 MED ORDER — LACTATED RINGERS IV SOLN
INTRAVENOUS | Status: AC
  Administered 2019-09-14: 18:00:00 via INTRAVENOUS

## 2019-09-14 MED ORDER — SODIUM CHLORIDE (HYPERTONIC) 2 % OP SOLN
1.0000 [drp] | Freq: Three times a day (TID) | OPHTHALMIC | Status: DC
  Administered 2019-09-14 – 2019-09-17 (×8): 1 [drp] via OPHTHALMIC
  Filled 2019-09-14 (×2): qty 15

## 2019-09-14 MED ORDER — PROPOFOL 500 MG/50ML IV EMUL
INTRAVENOUS | Status: DC | PRN
  Administered 2019-09-14: 75 ug/kg/min via INTRAVENOUS

## 2019-09-14 MED ORDER — CHLORHEXIDINE GLUCONATE 4 % EX LIQD: 60.0000 mL | Freq: Once | CUTANEOUS | Status: DC

## 2019-09-14 MED ORDER — DOCUSATE SODIUM 100 MG PO CAPS
100.0000 mg | ORAL_CAPSULE | Freq: Two times a day (BID) | ORAL | Status: DC
  Administered 2019-09-14 – 2019-09-17 (×6): 100 mg via ORAL
  Filled 2019-09-14 (×6): qty 1

## 2019-09-14 MED ORDER — 0.9 % SODIUM CHLORIDE (POUR BTL) OPTIME
TOPICAL | Status: DC | PRN
  Administered 2019-09-14 (×3): 1000 mL

## 2019-09-14 MED ORDER — TAMSULOSIN HCL 0.4 MG PO CAPS
0.4000 mg | ORAL_CAPSULE | Freq: Every day | ORAL | Status: DC
  Administered 2019-09-15 – 2019-09-17 (×3): 0.4 mg via ORAL
  Filled 2019-09-14 (×3): qty 1

## 2019-09-14 MED ORDER — TRANEXAMIC ACID-NACL 1000-0.7 MG/100ML-% IV SOLN
1000.0000 mg | INTRAVENOUS | Status: DC
  Administered 2019-09-14: 11:00:00 1000 mg via INTRAVENOUS
  Filled 2019-09-14: qty 100

## 2019-09-14 MED ORDER — CEFAZOLIN SODIUM-DEXTROSE 2-4 GM/100ML-% IV SOLN
2.0000 g | INTRAVENOUS | Status: AC
  Administered 2019-09-14: 11:00:00 2 g via INTRAVENOUS

## 2019-09-14 MED ORDER — LIDOCAINE HCL URETHRAL/MUCOSAL 2 % EX GEL
1.0000 "application " | Freq: Once | CUTANEOUS | Status: DC
  Filled 2019-09-14: qty 20

## 2019-09-14 MED ORDER — RIVAROXABAN 10 MG PO TABS
10.0000 mg | ORAL_TABLET | ORAL | Status: DC
  Administered 2019-09-15 – 2019-09-17 (×3): 10 mg via ORAL
  Filled 2019-09-14 (×4): qty 1

## 2019-09-14 MED ORDER — METOCLOPRAMIDE HCL 5 MG/ML IJ SOLN: 5.0000 mg | Freq: Three times a day (TID) | INTRAMUSCULAR | Status: DC | PRN

## 2019-09-14 MED ORDER — VANCOMYCIN HCL 1000 MG IV SOLR
INTRAVENOUS | Status: DC | PRN
  Administered 2019-09-14: 1000 mg

## 2019-09-14 MED ORDER — TRANEXAMIC ACID-NACL 1000-0.7 MG/100ML-% IV SOLN
1000.0000 mg | INTRAVENOUS | Status: DC
  Filled 2019-09-14: qty 100

## 2019-09-14 MED ORDER — HYDROMORPHONE HCL 1 MG/ML IJ SOLN: 0.5000 mg | INTRAMUSCULAR | Status: DC | PRN

## 2019-09-14 MED ORDER — SODIUM CHLORIDE (HYPERTONIC) 5 % OP OINT
1.0000 "application " | TOPICAL_OINTMENT | Freq: Every day | OPHTHALMIC | Status: DC
  Administered 2019-09-14 – 2019-09-16 (×3): 1 via OPHTHALMIC
  Filled 2019-09-14: qty 3.5

## 2019-09-14 MED ORDER — LACTATED RINGERS IV SOLN
Freq: Once | INTRAVENOUS | Status: AC
  Administered 2019-09-14: 10:00:00 via INTRAVENOUS

## 2019-09-14 MED ORDER — ACETAMINOPHEN 325 MG PO TABS
325.0000 mg | ORAL_TABLET | Freq: Four times a day (QID) | ORAL | Status: DC | PRN
  Administered 2019-09-15 – 2019-09-17 (×4): 650 mg via ORAL
  Filled 2019-09-14 (×4): qty 2

## 2019-09-14 MED ORDER — ONDANSETRON HCL 4 MG/2ML IJ SOLN: 4.0000 mg | Freq: Once | INTRAMUSCULAR | Status: DC | PRN

## 2019-09-14 MED ORDER — ATROPINE SULFATE 0.4 MG/ML IV SOSY
PREFILLED_SYRINGE | INTRAVENOUS | Status: DC | PRN
  Administered 2019-09-14: .4 mg via INTRAVENOUS

## 2019-09-14 SURGICAL SUPPLY — 59 items
BAG DECANTER FOR FLEXI CONT (MISCELLANEOUS) ×3 IMPLANT
BLADE CLIPPER SURG (BLADE) ×3 IMPLANT
BLADE SAW SGTL 18X1.27X75 (BLADE) ×2 IMPLANT
BLADE SAW SGTL 18X1.27X75MM (BLADE) ×1
CELLS DAT CNTRL 66122 CELL SVR (MISCELLANEOUS) ×1 IMPLANT
CLOSURE STERI-STRIP 1/2X4 (GAUZE/BANDAGES/DRESSINGS) ×1
CLOSURE WOUND 1/2 X4 (GAUZE/BANDAGES/DRESSINGS) ×1
CLSR STERI-STRIP ANTIMIC 1/2X4 (GAUZE/BANDAGES/DRESSINGS) ×1 IMPLANT
COVER PERINEAL POST (MISCELLANEOUS) ×3 IMPLANT
COVER SURGICAL LIGHT HANDLE (MISCELLANEOUS) ×3 IMPLANT
COVER WAND RF STERILE (DRAPES) ×3 IMPLANT
DRAPE C-ARM 42X72 X-RAY (DRAPES) ×3 IMPLANT
DRAPE STERI IOBAN 125X83 (DRAPES) ×3 IMPLANT
DRAPE U-SHAPE 47X51 STRL (DRAPES) ×9 IMPLANT
DRSG AQUACEL AG ADV 3.5X10 (GAUZE/BANDAGES/DRESSINGS) ×3 IMPLANT
DURAPREP 26ML APPLICATOR (WOUND CARE) ×3 IMPLANT
ELECT BLADE 4.0 EZ CLEAN MEGAD (MISCELLANEOUS) ×3
ELECT BLADE 6.5 EXT (BLADE) ×2 IMPLANT
ELECT REM PT RETURN 9FT ADLT (ELECTROSURGICAL) ×3
ELECTRODE BLDE 4.0 EZ CLN MEGD (MISCELLANEOUS) ×1 IMPLANT
ELECTRODE REM PT RTRN 9FT ADLT (ELECTROSURGICAL) ×1 IMPLANT
GLOVE BIOGEL PI IND STRL 8 (GLOVE) ×1 IMPLANT
GLOVE BIOGEL PI INDICATOR 8 (GLOVE) ×2
GLOVE SURG ORTHO 8.0 STRL STRW (GLOVE) ×6 IMPLANT
GOWN STRL REUS W/ TWL LRG LVL3 (GOWN DISPOSABLE) ×3 IMPLANT
GOWN STRL REUS W/TWL LRG LVL3 (GOWN DISPOSABLE) ×9
HANDPIECE INTERPULSE COAX TIP (DISPOSABLE) ×3
HEAD M SROM 36MM PLUS 1.5 (Hips) IMPLANT
HOOD PEEL AWAY FLYTE STAYCOOL (MISCELLANEOUS) ×9 IMPLANT
KIT BASIN OR (CUSTOM PROCEDURE TRAY) ×3 IMPLANT
KIT TURNOVER KIT B (KITS) ×3 IMPLANT
LINER MARATHON NEUT +4X52X36 (Hips) ×2 IMPLANT
NEEDLE HYPO 22GX1.5 SAFETY (NEEDLE) ×3 IMPLANT
NS IRRIG 1000ML POUR BTL (IV SOLUTION) ×3 IMPLANT
PACK TOTAL JOINT (CUSTOM PROCEDURE TRAY) ×3 IMPLANT
PAD ARMBOARD 7.5X6 YLW CONV (MISCELLANEOUS) ×3 IMPLANT
PIN SECTOR W/GRIP ACE CUP 52MM (Hips) ×2 IMPLANT
RETRACTOR WND ALEXIS 18 MED (MISCELLANEOUS) ×1 IMPLANT
RTRCTR WOUND ALEXIS 18CM MED (MISCELLANEOUS) ×3
SCREW 6.5MMX25MM (Screw) IMPLANT
SCREW PINN CAN 6.5X20 (Screw) ×2 IMPLANT
SET HNDPC FAN SPRY TIP SCT (DISPOSABLE) ×1 IMPLANT
SROM M HEAD 36MM PLUS 1.5 (Hips) ×3 IMPLANT
STEM FEM ACTIS STD SZ2 (Stem) ×2 IMPLANT
STRIP CLOSURE SKIN 1/2X4 (GAUZE/BANDAGES/DRESSINGS) ×2 IMPLANT
SUT ETHIBOND NAB CT1 #1 30IN (SUTURE) ×6 IMPLANT
SUT MNCRL AB 3-0 PS2 18 (SUTURE) ×5 IMPLANT
SUT VIC AB 0 CT1 27 (SUTURE) ×9
SUT VIC AB 0 CT1 27XBRD ANBCTR (SUTURE) ×3 IMPLANT
SUT VIC AB 1 CT1 27 (SUTURE) ×24
SUT VIC AB 1 CT1 27XBRD ANBCTR (SUTURE) ×3 IMPLANT
SUT VIC AB 2-0 CT1 27 (SUTURE) ×6
SUT VIC AB 2-0 CT1 TAPERPNT 27 (SUTURE) ×2 IMPLANT
SYR 30ML LL (SYRINGE) ×3 IMPLANT
TOWEL GREEN STERILE (TOWEL DISPOSABLE) ×3 IMPLANT
TOWEL GREEN STERILE FF (TOWEL DISPOSABLE) ×3 IMPLANT
TRAY FOLEY MTR SLVR 16FR STAT (SET/KITS/TRAYS/PACK) ×3 IMPLANT
TUBE SUCT ARGYLE STRL (TUBING) ×2 IMPLANT
WATER STERILE IRR 1000ML POUR (IV SOLUTION) ×3 IMPLANT

## 2019-09-14 NOTE — Consult Note (Addendum)
Medical Consultation   [name]  [id number]  DOB: [birth year]  DOA: 09/14/2019  PCP: Brandon Lass, MD   Requesting physician: Dr. Marlou Mccormick  Reason for consultation: Abdominal pain   History of Present Illness: [name] is an 78 y.o. 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    He's now s/p L total hip arthroplasty by orthopedics.  [facility] was consulted due to abdominal pain that started post operatively.  [name] notes that this started around 11 am.  He describes it as being located in his lower abdomen and says that it "just hurts", but does not describe it any further.  He notes it waxes and wanes.  He notes relief when he belches after drinking.  Denies N/V, diarrhea, dysuria.  No fevers, chills, cough, or cold.  He had negative COVID 19 testing on 10/23.  He was noted to have an enterococcus UTI on 10/22 and was started on ampicillin on 10/25 and has completed 2 days of antibiotics.  Review of Systems:  ROS As per HPI otherwise review of systems negative.   Past Medical History: Past Medical History:  Diagnosis Date  . Attention deficit disorder   . Blind left eye   . Diabetes mellitus without complication ([name])   . Hyperlipidemia   . Hypertension     Past Surgical History: Past Surgical History:  Procedure Laterality Date  . EYE SURGERY Left    x3  . KNEE ARTHROSCOPY Bilateral 03/29/2014   Procedure: RIGHT KNEE ARTHROSCOPY AND DEBRIDEMENT, LEFT KNEE ASPIRATION,  LEFT KNEE ARTHROSCOPY AND DEBRIDEMENT.;  Surgeon: Brandon Pel, MD;  Location: [facility];  Service: Orthopedics;  Laterality: Bilateral;  RIGHT KNEE ARTHROSCOPY AND DEBRIDEMENT, LEFT KNEE ASPIRATION,  LEFT KNEE ARTHROSCOPY AND DEBRIDEMENT.  Marland Kitchen KNEE ARTHROTOMY Bilateral 04/26/2014   Procedure: KNEE ARTHROTOMY, SYNOVECTOMY, ANTIBIOTIC BEAD PLACEMENT.;  Surgeon:  Brandon Pel, MD;  Location: [facility];  Service: Orthopedics;  Laterality: Bilateral;  . lipoma removal, right upper back    . TONSILLECTOMY       Allergies:  No Known Allergies   Social History:  reports that he has quit smoking. He has never used smokeless tobacco. He reports current alcohol use. He reports that he does not use drugs.   Family History: Family History  Problem Relation Age of Onset  . Heart attack Father   . Heart attack Sister   . Heart attack Brother     Physical Exam: Vitals:   09/14/19 1617 09/14/19 1638 09/14/19 1708 09/14/19 1725  BP: 132/67 131/76 (!) 143/79 (!) 146/79  Pulse: 65 70 81 86  Resp: 19 19 (!) 25 20  Temp:   (!) 96.8 F (36 C) (!) 97.5 F (36.4 C)  TempSrc:    Oral  SpO2: 100% 99% 99% 100%  Weight:      Height:        Constitutional: appears comfortable,  Alert and awake, oriented x3, not in any acute distress. Eyes: PERLA, EOMI, irises appear normal, anicteric sclera,  ENMT: external ears and nose appear normal, normal hearing            Lips appears normal, oropharynx mucosa normal  Neck: neck appears normal, supple  CVS: S1-S2 clear, no murmur rubs or gallops, no LE edema, normal pedal pulses  Respiratory:  clear to auscultation bilaterally, no wheezing, rales or rhonchi. Respiratory effort normal. No accessory muscle use.  Abdomen: soft nontender, nondistended, normal bowel sounds, no hepatosplenomegaly, no hernias  Musculoskeletal: : no cyanosis, clubbing or edema noted bilaterally Neuro: Cranial nerves II-XII intact, strength, sensation, reflexes Psych: judgement and insight appear normal, stable mood and affect, mental status Skin: no rashes or lesions or ulcers, no induration or nodules   Data reviewed:  I have personally reviewed following labs and imaging studies  Labs:  CBC: No results for input(s): WBC, NEUTROABS, HGB, HCT, MCV, PLT in the last 168 hours.  Basic Metabolic Panel: No results for input(s): NA,  K, CL, CO2, GLUCOSE, BUN, CREATININE, CALCIUM, MG, PHOS in the last 168 hours. GFR Estimated Creatinine Clearance: 61.1 mL/min (by C-G formula based on SCr of 1.03 mg/dL). Liver Function Tests: No results for input(s): AST, ALT, ALKPHOS, BILITOT, PROT, ALBUMIN in the last 168 hours. No results for input(s): LIPASE, AMYLASE in the last 168 hours. No results for input(s): AMMONIA in the last 168 hours. Coagulation profile No results for input(s): INR, PROTIME in the last 168 hours.  Cardiac Enzymes: No results for input(s): CKTOTAL, CKMB, CKMBINDEX, TROPONINI in the last 168 hours. BNP: Invalid input(s): POCBNP CBG: Recent Labs  Lab 09/14/19 0930 09/14/19 1413  GLUCAP 129* 118*   D-Dimer No results for input(s): DDIMER in the last 72 hours. Hgb A1c No results for input(s): HGBA1C in the last 72 hours. Lipid Profile No results for input(s): CHOL, HDL, LDLCALC, TRIG, CHOLHDL, LDLDIRECT in the last 72 hours. Thyroid function studies No results for input(s): TSH, T4TOTAL, T3FREE, THYROIDAB in the last 72 hours.  Invalid input(s): FREET3 Anemia work up No results for input(s): VITAMINB12, FOLATE, FERRITIN, TIBC, IRON, RETICCTPCT in the last 72 hours. Urinalysis    Component Value Date/Time   COLORURINE YELLOW 09/09/2019 1512   APPEARANCEUR CLOUDY ([name]) 09/09/2019 1512   LABSPEC 1.015 09/09/2019 1512   PHURINE 5.5 09/09/2019 1512   GLUCOSEU NEGATIVE 09/09/2019 1512   HGBUR NEGATIVE 09/09/2019 1512   [place] 09/07/2019 1020   KETONESUR NEGATIVE 09/09/2019 1512   PROTEINUR NEGATIVE 09/09/2019 1512   UROBILINOGEN 0.2 04/29/2014 1111   NITRITE POSITIVE ([name]) 09/09/2019 1512   LEUKOCYTESUR 1+ ([name]) 09/09/2019 1512     Microbiology Recent Results (from the past 240 hour(s))  Urine culture     Status: Abnormal   Collection Time: 09/07/19 10:09 AM   Specimen: Urine, Clean Catch  Result Value Ref Range Status   Specimen Description URINE, CLEAN CATCH  Final   Special  Requests   Final    NONE Performed at [facility], [place] [address] [place], [place] [address]    Culture MULTIPLE SPECIES PRESENT, SUGGEST RECOLLECTION ([name])  Final   Report Status 09/09/2019 FINAL  Final  Surgical pcr screen     Status: None   Collection Time: 09/07/19 11:08 AM   Specimen: Nasal Mucosa; Nasal Swab  Result Value Ref Range Status   MRSA, PCR NEGATIVE NEGATIVE Final   Staphylococcus aureus NEGATIVE NEGATIVE Final    Comment: (NOTE) The Xpert Mccormick Assay (FDA approved for NASAL specimens in patients 25 years of age and older), is one component of Kaoru Rezendes comprehensive surveillance program. It is not intended to diagnose infection nor to guide or monitor treatment. Performed at [facility], [place] [address], [place], [place] [address]   Urine Culture     Status: Abnormal   Collection Time: 09/09/19  3:12 PM   Specimen: Urine  Result Value Ref Range Status   MICRO NUMBER: [id number]  Final   SPECIMEN QUALITY: Adequate  Final   Sample Source NOT GIVEN  Final   STATUS: FINAL  Final   ISOLATE 1: Enterococcus faecium (Alexsis Branscom)  Final    Comment: Greater than 100,000 CFU/mL of Enterococcus faecium      Susceptibility   Enterococcus faecium - URINE CULTURE POSITIVE 1    AMPICILLIN 8 Sensitive     VANCOMYCIN <=0.5 Sensitive     NITROFURANTOIN* 128 Resistant      * Legend:S = Susceptible  I = IntermediateR = Resistant  NS = Not susceptible* = Not tested  NR = Not reported**NN = See antimicrobic comments  Novel Coronavirus, NAA (Hosp order, Send-out to Ref Lab; TAT 18-24 hrs     Status: None   Collection Time: 09/10/19 10:32 AM   Specimen: Nasopharyngeal Swab; Respiratory  Result Value Ref Range Status   SARS-CoV-2, NAA NOT DETECTED NOT DETECTED Final    Comment: (NOTE) This nucleic acid amplification test was developed and its performance characteristics determined by [employer]. Nucleic acid amplification tests include PCR and TMA. This test has not been FDA  cleared or approved. This test has been authorized by FDA under an Emergency Use Authorization (EUA). This test is only authorized for the duration of time the declaration that circumstances exist justifying the authorization of the emergency use of in vitro diagnostic tests for detection of SARS-CoV-2 virus and/or diagnosis of COVID-19 infection under section 564(b)(1) of the Act, 21 U.S.C. [id number]) (1), unless the authorization is terminated or revoked sooner. When diagnostic testing is negative, the possibility of Lizzy Hamre false negative result should be considered in the context of Blaise Grieshaber patient's recent exposures and the presence of clinical signs and symptoms consistent with COVID-19. An individual without symptoms of COVID- 19 and who is not shedding SARS-CoV-2 vi rus would expect to have Zakaria Fromer negative (not detected) result in this assay. Performed At: [facility] [address] [place], [place] [id number] Rush Farmer MD [id number]    [place]  Final    Comment: Performed at [facility], [place] [address], [place], [place] [address]       Inpatient Medications:   Scheduled Meds: . brimonidine  1 drop Left Eye TID  . docusate sodium  100 mg Oral BID  . fentaNYL      . lidocaine  1 application Urethral Once  . [START ON 09/15/2019] rivaroxaban  10 mg Oral Q24H  . sodium chloride  1 drop Left Eye TID  . sodium chloride  1 application Left Eye QHS  . tamsulosin  0.4 mg Oral Daily  . timolol  1 drop Left Eye BID  . traMADol  50 mg Oral Q6H   Continuous Infusions: .  ceFAZolin (ANCEF) IV 2 g (09/14/19 1838)  . lactated ringers 75 mL/hr at 09/14/19 1828  . methocarbamol (ROBAXIN) IV       Radiological Exams on Admission: Dg C-arm 1-60 Min  Result Date: 09/14/2019 CLINICAL DATA:  Left hip replacement. EXAM: OPERATIVE left HIP (WITH PELVIS IF PERFORMED) 2 VIEWS TECHNIQUE: Fluoroscopic spot image(s) were submitted for interpretation  post-operatively. Radiation exposure index: 46 seconds. COMPARISON:  August 30, 2019. FINDINGS: The left acetabular and femoral components appear to be well situated. Expected postoperative changes are seen in the surrounding soft tissues. IMPRESSION: Fluoroscopic guidance provided during left total hip arthroplasty. Electronically Signed   By: Jeneen Rinks  Murlean Caller M.D.   On: 09/14/2019 13:56   Dg Hip Port Unilat With Pelvis 1v Left  Result Date: 09/14/2019 CLINICAL DATA:  Post total left hip arthroplasty EXAM: DG HIP (WITH OR WITHOUT PELVIS) 1V PORT LEFT COMPARISON:  Intraoperative fluoroscopy 09/14/2019, comparison radiograph 08/30/2019 FINDINGS: Patient has undergone total left hip arthroplasty with normal alignment of the articulating femoral stem in the screw fixed acetabular component. Expected postsurgical soft tissue changes including soft tissue gas and intra-articular gas are noted. Mild-to-moderate degenerative changes are again seen in the right hip. Bones of the pelvis are otherwise congruent. Vascular calcium noted in the medial thighs. Remaining soft tissues are unremarkable. IMPRESSION: Normal postoperative appearance of the left total hip arthroplasty without evidence of acute hardware complication. Electronically Signed   By: Lovena Le M.D.   On: 09/14/2019 16:58   Dg Hip Operative Unilat W Or W/o Pelvis Left  Result Date: 09/14/2019 CLINICAL DATA:  Left hip replacement. EXAM: OPERATIVE left HIP (WITH PELVIS IF PERFORMED) 2 VIEWS TECHNIQUE: Fluoroscopic spot image(s) were submitted for interpretation post-operatively. Radiation exposure index: 46 seconds. COMPARISON:  August 30, 2019. FINDINGS: The left acetabular and femoral components appear to be well situated. Expected postoperative changes are seen in the surrounding soft tissues. IMPRESSION: Fluoroscopic guidance provided during left total hip arthroplasty. Electronically Signed   By: Marijo Conception M.D.   On: 09/14/2019 13:56     Impression/Recommendations Active Problems:   Essential hypertension   Diabetes mellitus type II, controlled ([place])   Hyperlipidemia   Acute urinary retention  Abdominal Pain  Acute Urinary Retention: c/o pain post op.  Bladder scan with >400 cc.  Pt instructed to attempt to void.  Discussed with RN and recommended calling coude team for difficult foley placement (as it appears cath was attempted in OR, but failed).   Will start flomax Place foley UA/culture CBC/CMP/lipase pending at this time If pain is resolved after foley placement, can avoid additional imaging, but if pain is persistent, would recommend CT abdomen/pelvis with contrast for further evaluation. Addendum: nursing unable to place foley -> called Dr. Gloriann Loan of urology, appreciate his assistance - he's going to come attempt foley placement.  Please follow up pt abdominal pain after foley placement.  If this is improved, can avoid additional imaging.  If this is persistent or worsened, recommend CT abdomen/pelvis (discussed with nursing).  Enterococcus UTI: treated with ampicillin prior to admission.  This was prescribed on Sunday.   He does not seem overtly sx with dysuria or frequency, though with his urinary retention, will plan to treat.  Repeat UA and cx. Will start amoxicillin x7 days   Bradycardia: cardiology following, appreciate recs.  Sounds like this happened in OR in setting of spinal anesthesia and fentanyl.   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  Occurred in 2015.  Not clear when he discontinued this, appears this was provoked in the setting of surgery based on my review of previous d/c summaries.  Hx T2DM: a1c 6.6.  He's not on any diabetes meds.   Says he stopped his metformin.  No longer taking his ASA or statin. SSI, consider adding basal/bolus based on BG's  Pt needs updated med rec by pharmacy.  He's not taking many of the medications that are currently on his medication list.  Sounds like he has discontinued many of these on his own.  He'll need to follow up with his outpatient providers regarding these medications.  Thank you for this consultation.  Our [facility] hospitalist team will follow the patient with you.   Time Spent: 3 minutes  Fayrene Helper M.D. [facility] 09/14/2019, 6:48 PM

## 2019-09-14 NOTE — Care Plan (Signed)
Ortho Bundle Case Management Note  Patient Details  Name: [name] MRN: [id number] Date of Birth: [birth year]  [facility] called patient prior to surgery to discuss Ortho bundle. Spoke with patient and his wife for all information regarding bundle. Patient has a wife that will be available to help after discharge from hospital. Will need a FWW and 3in1 DME. Referral will be made to Portal. Anticipate home health PT at discharge after short hospital stay. Choice provided. Referral made to [facility] to liaison. Will continue to assess for CM needs.                       DME Arranged:  3-N-1, [place] rolling DME Agency:  (Will make referral to [facility])  [place] Arranged:  PT [place]:  [facility] (now [facility])  Additional Comments: Please contact me with any questions of if this plan should need to change.  Jamse Arn, RN, BSN, [employer]  [phone] 09/14/2019, 8:45 PM

## 2019-09-14 NOTE — Transfer of Care (Signed)
Immediate Anesthesia Transfer of Care Note  Patient: [name]  Procedure(s) Performed: LEFT TOTAL HIP ARTHROPLASTY ANTERIOR APPROACH (Left Hip)  Patient Location: PACU  Anesthesia Type:General  Level of Consciousness: awake, alert  and oriented  Airway & Oxygen Therapy: Patient Spontanous Breathing and Patient connected to face mask oxygen  Post-op Assessment: Report given to RN and Post -op Vital signs reviewed and stable  Post vital signs: Reviewed and stable  Last Vitals:  Vitals Value Taken Time  BP 108/66 09/14/19 1422  Temp 36.6 C 09/14/19 1409  Pulse 67 09/14/19 1423  Resp 21 09/14/19 1423  SpO2 100 % 09/14/19 1423  Vitals shown include unvalidated device data.  Last Pain:  Vitals:   09/14/19 1409  TempSrc:   PainSc: 0-No pain      Patients Stated Pain Goal: 1 ([id number])  Complications: No apparent anesthesia complications

## 2019-09-14 NOTE — Progress Notes (Signed)
Pt seen at [facility] for coude catheter insertion. Urojet lidocaine was used prior to procedure, pt prepped per policy using sterile technique. #16 fr coude catheter attempt to insert. Insertion unsuccessful , pt tolerated well. Charge nurse Ronni made aware of inability to insert coude catheter.

## 2019-09-14 NOTE — Brief Op Note (Signed)
   09/14/2019  1:57 PM  PATIENT:  [name]  78 y.o. male  PRE-OPERATIVE DIAGNOSIS:  osteoarthritis left hip  POST-OPERATIVE DIAGNOSIS:  osteoarthritis left hip  PROCEDURE:  Procedure(s): LEFT TOTAL HIP ARTHROPLASTY ANTERIOR APPROACH  SURGEON:  Surgeon(s): Meredith Pel, MD  ASSISTANT: magnant pa  ANESTHESIA:   spinal  EBL: 150 ml    Total I/O In: 1000 [I.V.:1000] Out: 50 [Blood:50]  BLOOD ADMINISTERED: none  DRAINS: none   LOCAL MEDICATIONS USED:  none  SPECIMEN:  No Specimen  COUNTS:  YES  TOURNIQUET:  * No tourniquets in log *  DICTATION: .Other Dictation: Dictation Number [id number]  PLAN OF CARE: Admit to inpatient   PATIENT DISPOSITION:  PACU - hemodynamically stable  Patient had suggestion of urinary tract infection preoperatively.  He was taking oral antibiotics prescribed for 2 days prior to the procedure.  We attempted to do a catheterization for urinalysis after the case but it would not pass his prostate.  We will keep him on IV antibiotics for 4 doses total postop.

## 2019-09-14 NOTE — Anesthesia Procedure Notes (Signed)
Spinal  Patient location during procedure: OR Start time: 09/14/2019 11:03 AM End time: 09/14/2019 11:07 AM Staffing Anesthesiologist: Josephine Igo, MD Performed: anesthesiologist  Preanesthetic Checklist Completed: patient identified, site marked, surgical consent, pre-op evaluation, timeout performed, IV checked, risks and benefits discussed and monitors and equipment checked Spinal Block Patient position: sitting Prep: site prepped and draped and DuraPrep Patient monitoring: heart rate, cardiac monitor, continuous pulse ox and blood pressure Approach: midline Location: L4-5 Injection technique: single-shot Needle Needle type: Pencan  Needle gauge: 24 G Needle length: 9 cm Needle insertion depth: 6 cm Assessment Sensory level: T4 Additional Notes Patient tolerated procedure well. Adequate sensory level.

## 2019-09-14 NOTE — Progress Notes (Signed)
Patient stable Leg lengths approximately equal Foot perfused and sensate Left hip x-rays look good Mobilize with therapy with possible discharge Thursday Xarelto for DVT prophylaxis

## 2019-09-14 NOTE — H&P (Signed)
TOTAL HIP ADMISSION H&P  Patient is admitted for left total hip arthroplasty.  Subjective:  Chief Complaint: left hip pain  HPI: [name], 78 y.o. male, has a history of pain and functional disability in the left hip(s) due to arthritis and patient has failed non-surgical conservative treatments for greater than 12 weeks to include NSAID's and/or analgesics, flexibility and strengthening excercises, use of assistive devices and activity modification.  Onset of symptoms was gradual starting 4 years ago with rapidlly worsening course since that time.The patient noted no past surgery on the left hip(s).  Patient currently rates pain in the left hip at 9 out of 10 with activity. Patient has night pain, worsening of pain with activity and weight bearing, trendelenberg gait, pain that interfers with activities of [facility]cc[facility]ick living, pain with passive range of motion, crepitus and joint swelling. Patient has evidence of subchondral cysts, subchondral sclerosis, periarticular osteophytes and joint space narrowing by imaging studies. This condition presents safety issues increasing the risk of falls. This patient has had History of DVT in bilateral lower extremities.  Currently he is asymptomatic and not on blood thinners.  Additionally the patient has a history of bilateral knee infections which were spontaneous and treated with irrigation and debridement about 10 years ago.  Lab work preoperatively indicated normal white count sed rate and C-reactive protein..  There is no current active infection.  Patient Active Problem List   Diagnosis Date Noted  . Protein-calorie malnutrition, severe ([place]) 04/28/2014  . Acute blood loss anemia 04/28/2014  . Septic joint ([place]) 04/25/2014  . DVT of lower extremity, bilateral ([place]) 04/01/2014  . Bacterial infection of knee joint ([place]) 03/29/2014  . Essential hypertension 03/01/2014  . Chest pain 03/01/2014  . Diabetes mellitus type II, controlled ([place]) 03/01/2014  .  Hyperlipidemia 03/01/2014   Past Medical History:  Diagnosis Date  . Attention deficit disorder   . Blind left eye   . Diabetes mellitus without complication ([place])   . Hyperlipidemia   . Hypertension     Past Surgical History:  Procedure Laterality Date  . EYE SURGERY Left    x3  . KNEE ARTHROSCOPY Bilateral 03/29/2014   Procedure: RIGHT KNEE ARTHROSCOPY AND DEBRIDEMENT, LEFT KNEE ASPIRATION,  LEFT KNEE ARTHROSCOPY AND DEBRIDEMENT.;  Surgeon: Meredith Pel, MD;  Location: [facility];  Service: Orthopedics;  Laterality: Bilateral;  RIGHT KNEE ARTHROSCOPY AND DEBRIDEMENT, LEFT KNEE ASPIRATION,  LEFT KNEE ARTHROSCOPY AND DEBRIDEMENT.  Marland Kitchen KNEE ARTHROTOMY Bilateral 04/26/2014   Procedure: KNEE ARTHROTOMY, SYNOVECTOMY, ANTIBIOTIC BEAD PLACEMENT.;  Surgeon: Meredith Pel, MD;  Location: [facility];  Service: Orthopedics;  Laterality: Bilateral;  . lipoma removal, right upper back    . TONSILLECTOMY      Current Facility-Administered Medications  Medication Dose Route Frequency Provider Last Rate Last Dose  . ceFAZolin (ANCEF) 2-4 GM/100ML-% IVPB           . ceFAZolin (ANCEF) IVPB 2g/100 mL premix  2 g Intravenous On Call to OR Magnant, Charles L, PA-C      . chlorhexidine (HIBICLENS) 4 % liquid 4 application  60 mL Topical Once Magnant, Charles L, PA-C      . chlorhexidine (HIBICLENS) 4 % liquid 4 application  60 mL Topical Once Magnant, Charles L, PA-C      . tranexamic acid (CYKLOKAPRON) IVPB 1,000 mg  1,000 mg Intravenous To OR Marlou Sa, Tonna Corner, MD       No Known Allergies  Social History   Tobacco Use  .  Smoking status: Former Research scientist (life sciences)  . Smokeless tobacco: Never Used  Substance Use Topics  . Alcohol use: Yes    Comment: occasional     Family History  Problem Relation Age of Onset  . Heart attack Father   . Heart attack Sister   . Heart attack Brother      Review of Systems  Musculoskeletal: Positive for joint pain.  All other systems reviewed and are  negative.   Objective:  Physical Exam  Constitutional: He appears well-developed.  HENT:  Head: Normocephalic.  Eyes: Pupils are equal, round, and reactive to light.  Neck: Normal range of motion.  Cardiovascular: Normal rate.  Respiratory: Effort normal.  Neurological: He is alert.  Skin: Skin is warm.  Psychiatric: He has a normal mood and affect.  Examination of the left hip demonstrates diminished range of motion which is painful as well as leg length discrepancy on the left-hand side.  There is no effusion bilateral knees.  No warmth to the knees.  Patient has palpable pedal pulses and no calf tenderness or swelling bilaterally.  Does have reasonable hip flexion strength.  No masses lymphadenopathy or skin changes noted in that left hip region  Vital signs in last 24 hours: Temp:  [98 F (36.7 C)] 98 F (36.7 C) (10/27 0925) Pulse Rate:  [77] 77 (10/27 0925) Resp:  [18] 18 (10/27 0925) BP: (163)/(74) 163/74 (10/27 0928) SpO2:  [100 %] 100 % (10/27 0925) Weight:  [80.6 kg] 80.6 kg (10/27 0925)  Labs:   Estimated body mass index is 27.82 kg/m as calculated from the following:   Height as of this encounter: 5\' 7"  (1.702 m).   Weight as of this encounter: 80.6 kg.   Imaging Review Plain radiographs demonstrate severe degenerative joint disease of the left hip(s). The bone quality appears to be good for age and reported activity level.      Assessment/Plan:  End stage arthritis, left hip(s)  The patient history, physical examination, clinical judgement of the provider and imaging studies are consistent with end stage degenerative joint disease of the left hip(s) and total hip arthroplasty is deemed medically necessary. The treatment options including medical management, injection therapy, arthroscopy and arthroplasty were discussed at length. The risks and benefits of total hip arthroplasty were presented and reviewed. The risks due to aseptic loosening, infection,  stiffness, dislocation/subluxation,  thromboembolic complications and other imponderables were discussed.  The patient acknowledged the explanation, agreed to proceed with the plan and consent was signed. Patient is being admitted for inpatient treatment for surgery, pain control, PT, OT, prophylactic antibiotics, VTE prophylaxis, progressive ambulation and ADL's and discharge planning.The patient is planning to be discharged to skilled nursing facility   Anticipated LOS equal to or greater than 2 midnights due to - Age 8 and older with one or more of the following:  - Obesity  - Expected need for hospital services (PT, OT, Nursing) required for safe  discharge  - Anticipated need for postoperative skilled nursing care or inpatient rehab  - Active co-morbidities: None OR   - Unanticipated findings during/Post Surgery: None  - Patient is a high risk of re-admission due to: None

## 2019-09-14 NOTE — Consult Note (Signed)
Cardiology Consultation:   Patient ID: [name] MRN: [id number]; DOB: 09-05-1941  Admit date: 09/14/2019 Date of Consult: 09/14/2019  Primary Care Provider: Kathyrn Lass, MD Primary Cardiologist: No primary care provider on file. Dr. Tamala Julian  Primary Electrophysiologist:  None    Patient Profile:   [name] is a 78 y.o. male with a hx of HTN, DM, HLD and premature CAD in father and siblings who is being seen today for the evaluation of Heart block in OR at the request of Dr. Marlou Sa.  History of Present Illness:   [name] with above hx and last seen by our group in 2015 after episode of chest pain and had normal nuc study.  He has been diabetic about 20 years, strong FH in father and 2 brothers along with sister with MIs.  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  Echo in 2015 with EF 60-65%, PA pk pressure 49 mmHg, nuc study 2015 neg for ischemia.    Pt with increasing hip pain in Lt.  With plan for Lt total hip.  He underwent this today with spinal anesthesia Heart rate dropped to 30, appears to be non conducted PACs.  Pt has not had any chest pain today or anytime.  He has DOE that is more chronic and no increase.  No syncope. Lightheadedness.  Has felt well except for hip pain.    outpt meds lisinopril 20, he is on timolol eye drops.   EKG:  The EKG was personally reviewed and demonstrates:  SR at 58 with early repol and today SR at 40 and early repol no acute ST changes.   Telemetry:  Telemetry was personally reviewed and demonstrates:  SR COVID neg,  Hgb 14.6, WBC 4, plts 270  BMP NA 137, K+ 4.1, Cr 1.03  Hgb A1c 6.6 HS troponin 14   BP was 163/74 and now 123/71 P 62 to 77 Currently without complaints  Heart Pathway Score:     Past Medical History:  Diagnosis Date  . Attention deficit disorder   . Blind left eye   . Diabetes mellitus without complication ([place])   . Hyperlipidemia   . Hypertension     Past Surgical History:  Procedure Laterality Date  . EYE SURGERY  Left    x3  . KNEE ARTHROSCOPY Bilateral 03/29/2014   Procedure: RIGHT KNEE ARTHROSCOPY AND DEBRIDEMENT, LEFT KNEE ASPIRATION,  LEFT KNEE ARTHROSCOPY AND DEBRIDEMENT.;  Surgeon: Meredith Pel, MD;  Location: [facility];  Service: Orthopedics;  Laterality: Bilateral;  RIGHT KNEE ARTHROSCOPY AND DEBRIDEMENT, LEFT KNEE ASPIRATION,  LEFT KNEE ARTHROSCOPY AND DEBRIDEMENT.  Marland Kitchen KNEE ARTHROTOMY Bilateral 04/26/2014   Procedure: KNEE ARTHROTOMY, SYNOVECTOMY, ANTIBIOTIC BEAD PLACEMENT.;  Surgeon: Meredith Pel, MD;  Location: [facility];  Service: Orthopedics;  Laterality: Bilateral;  . lipoma removal, right upper back    . TONSILLECTOMY       Home Medications:  Prior to Admission medications   Medication Sig Start Date End Date Taking? Authorizing Provider  ALPRAZolam Duanne Moron) 0.5 MG tablet Take 0.5 mg by mouth daily as needed for anxiety.  05/12/17  Yes [provider]  ampicillin (PRINCIPEN) 250 MG capsule Take 2 capsules (500 mg total) by mouth 4 (four) times daily for 2 days. 09/12/19 09/14/19 Yes Magnant, Charles L, PA-C  brimonidine (ALPHAGAN) 0.2 % ophthalmic solution Place 1 drop into the left eye 3 (three) times daily.  05/06/17  Yes [provider]  naproxen sodium (ALEVE) 220 MG tablet Take 440 mg by  mouth daily as needed (pain).   Yes [provider]  sodium chloride (MURO 128) 2 % ophthalmic solution Place 1 drop into the left eye 3 (three) times daily.   Yes [provider]  sodium chloride (MURO 128) 5 % ophthalmic ointment Place 1 application into the left eye at bedtime.   Yes [provider]  timolol (TIMOPTIC) 0.5 % ophthalmic solution Place 1 drop into the left eye 2 (two) times daily.  05/12/17  Yes [provider]  aspirin EC 81 MG tablet Take 81 mg by mouth daily.    [provider]  baclofen (LIORESAL) 10 MG tablet Take 0.5 tablets (5 mg total) by mouth 3 (three) times daily as needed for muscle spasms. Patient not taking:  Reported on 09/02/2019 05/02/14   Samuella Cota, MD  cefTRIAXone 2 g in dextrose 5 % 50 mL Inject 2 g into the vein daily. Ceftriaxone 2gm IV daily for 6 wk using 04/27/14 as day of 1 of 42 End date will be July 22nd. Follow up in ID clinic in mid July to evaluate improvement. Patient not taking: Reported on 09/02/2019 05/02/14   Samuella Cota, MD  HYDROcodone-acetaminophen [facility]) 10-325 MG per tablet Take 1 tablet by mouth every 4 (four) hours as needed. Patient not taking: Reported on 09/02/2019 04/29/14   Meredith Pel, MD  lisinopril (PRINIVIL,ZESTRIL) 20 MG tablet Take 20 mg by mouth daily.    [provider]  menthol-cetylpyridinium (CEPACOL) 3 MG lozenge Take 1 lozenge (3 mg total) by mouth as needed for sore throat. Patient not taking: Reported on 09/02/2019 04/05/14   Dellinger, Bobby Rumpf, PA-C  rivaroxaban (XARELTO) 20 MG TABS tablet Take 1 tablet (20 mg total) by mouth daily with supper. Start taking 20 mg daily when the 21 days of 15 mg bid is complete Patient not taking: Reported on 09/02/2019 04/05/14   Dellinger, Bobby Rumpf, PA-C  simvastatin (ZOCOR) 20 MG tablet Take 20 mg by mouth daily.    [provider]    Inpatient Medications: Scheduled Meds: . chlorhexidine  60 mL Topical Once  . chlorhexidine  60 mL Topical Once   Continuous Infusions: . tranexamic acid     PRN Meds: fentaNYL (SUBLIMAZE) injection, ondansetron (ZOFRAN) IV  Allergies:   No Known Allergies  Social History:   Social History   Socioeconomic History  . Marital status: Married    Spouse name: Not on file  . Number of children: Not on file  . Years of education: Not on file  . Highest education level: Not on file  Occupational History  . Not on file  Social Needs  . Financial resource strain: Not on file  . Food insecurity    Worry: Not on file    Inability: Not on file  . Transportation needs    Medical: Not on file    Non-medical: Not on file  Tobacco Use   . Smoking status: Former Research scientist (life sciences)  . Smokeless tobacco: Never Used  Substance and Sexual Activity  . Alcohol use: Yes    Comment: occasional   . Drug use: No  . Sexual activity: Not on file  Lifestyle  . Physical activity    Days per week: Not on file    Minutes per session: Not on file  . Stress: Not on file  Relationships  . Social Herbalist on phone: Not on file    Gets together: Not on file    Attends  religious service: Not on file    Active member of club or organization: Not on file    Attends meetings of clubs or organizations: Not on file    Relationship status: Not on file  . Intimate partner violence    Fear of current or ex partner: Not on file    Emotionally abused: Not on file    Physically abused: Not on file    Forced sexual activity: Not on file  Other Topics Concern  . Not on file  Social History Narrative  . Not on file    Family History:    Family History  Problem Relation Age of Onset  . Heart attack Father   . Heart attack Sister   . Heart attack Brother      ROS:  Please see the history of present illness.  General:no colds or fevers, no weight changes Skin:no rashes or ulcers HEENT:no blurred vision, no congestion CV:see HPI PUL:see HPI GI:no diarrhea constipation or melena, no indigestion GU:no hematuria, no dysuria MS:no joint pain, no claudication Neuro:no syncope, no lightheadedness Endo:+ diabetes, no thyroid disease  All other ROS reviewed and negative.     Physical Exam/Data:   Vitals:   09/14/19 1422 09/14/19 1438 09/14/19 1452 09/14/19 1507  BP: 108/66 116/68 114/67 110/68  Pulse: 68 65 68 64  Resp: 19 19 19 16   Temp:      TempSrc:      SpO2: 100% 99% 100% 100%  Weight:      Height:        Intake/Output Summary (Last 24 hours) at 09/14/2019 1531 Last data filed at 09/14/2019 1415 Gross per 24 hour  Intake 1200 ml  Output 50 ml  Net 1150 ml   Last 3 Weights 09/14/2019 09/07/2019 08/30/2019  Weight  (lbs) 177 lb 9.6 oz 177 lb 9.6 oz 158 lb  Weight (kg) 80.559 kg 80.559 kg 71.668 kg     Body mass index is 27.82 kg/m.  General:  Well nourished, well developed, in no acute distress HEENT: normal Lymph: no adenopathy Neck: no JVD Endocrine:  No thryomegaly Vascular: No carotid bruits; pedal pulses 2+ bilaterally  Cardiac:  normal S1, S2; RRR; no murmur gallup rub or click Lungs:  clear to auscultation bilaterally, no wheezing, rhonchi or rales  Abd: soft, nontender, no hepatomegaly  Ext: no edema Musculoskeletal:  No deformities, BUE and BLE strength normal and equal Skin: warm and dry  Neuro:  Alert and oriented X 3 MAE follows commands, no focal abnormalities noted Psych:  Normal affect    Relevant CV Studies: 2 D Echo 04/2014 Study Conclusions   - Left ventricle: The cavity size was normal. Systolic function was  normal. The estimated ejection fraction was in the range of 60%  to 65%. Wall motion was normal; there were no regional wall  motion abnormalities.  - Pulmonary arteries: PA peak pressure: 49 mm Hg (S).  - Impressions: Overall very poor image quality.   Impressions:   - Overall very poor image quality.   Transthoracic echocardiography. M-mode, complete 2D, spectral  Doppler, and color Doppler. Birthdate: Patient birthdate:  08/26/1941. Age: Patient is 78 yr old. Sex: Gender: male.  Height: Height: 172.7 cm. Height: 68 in. Weight: Weight: 73.9  kg. Weight: 162.6 lb. Body mass index: BMI: 24.8 kg/m^2. Body  surface area:  BSA: 1.89 m^2. Blood pressure:   124/55  Patient status: Inpatient. Study date: Study date: 04/27/2014.  Study time: 03:10 PM. Location: Bedside.   -------------------------------------------------------------------   -------------------------------------------------------------------  Left ventricle: The cavity size was normal. Systolic function was  normal. The estimated ejection fraction was in the range of  60% to  65%. Wall motion was normal; there were no regional wall motion  abnormalities.   -------------------------------------------------------------------  Aortic valve:  Trileaflet; normal thickness, mildly calcified  leaflets. Mobility was not restricted. Doppler: Transvalvular  velocity was within the normal range. There was no stenosis. There  was no regurgitation.   -------------------------------------------------------------------  Aorta: The aorta was poorly visualized. Aortic root: The aortic  root was normal in size.   -------------------------------------------------------------------  Mitral valve:  Mildly thickened leaflets . Mobility was not  restricted. Doppler: Transvalvular velocity was within the normal  range. There was no evidence for stenosis. There was no  regurgitation.   -------------------------------------------------------------------  Left atrium: The atrium was normal in size.   -------------------------------------------------------------------  Atrial septum: Poorly visualized.   -------------------------------------------------------------------  Right ventricle: The cavity size was normal. Wall thickness was  normal. Systolic function was normal.   -------------------------------------------------------------------  Pulmonic valve:  Doppler: Transvalvular velocity was within the  normal range. There was no evidence for stenosis.   -------------------------------------------------------------------  Tricuspid valve:  Structurally normal valve.  Doppler:  Transvalvular velocity was within the normal range. There was mild  regurgitation.   -------------------------------------------------------------------  Pulmonary artery:  The main pulmonary artery was normal-sized.  Systolic pressure was within the normal range.   -------------------------------------------------------------------  Right atrium: The atrium was normal  in size.   -------------------------------------------------------------------  Pericardium: The pericardium was normal in appearance. There was  no pericardial effusion.   -------------------------------------------------------------------  Systemic veins:  Inferior vena cava: The vessel was normal in size.   -------------------------------------------------------------------  Post procedure conclusions  Ascending Aorta:   - The aorta was poorly visualized.    NUC study 02/2014 Overall Impression:  Low risk stress nuclear study without reversible ischemia. Mild bowel attenuation artifact inferiorly.  LV Ejection Fraction: 73%.  LV Wall Motion:  NL LV Function; NL Wall Motion  Laboratory Data:  High Sensitivity Troponin:  No results for input(s): TROPONINIHS in the last 720 hours.   ChemistryNo results for input(s): NA, K, CL, CO2, GLUCOSE, BUN, CREATININE, CALCIUM, GFRNONAA, GFRAA, ANIONGAP in the last 168 hours.  No results for input(s): PROT, ALBUMIN, AST, ALT, ALKPHOS, BILITOT in the last 168 hours. HematologyNo results for input(s): WBC, RBC, HGB, HCT, MCV, MCH, MCHC, RDW, PLT in the last 168 hours. BNPNo results for input(s): BNP, PROBNP in the last 168 hours.  DDimer No results for input(s): DDIMER in the last 168 hours.   Radiology/Studies:  Dg C-arm 1-60 Min  Result Date: 09/14/2019 CLINICAL DATA:  Left hip replacement. EXAM: OPERATIVE left HIP (WITH PELVIS IF PERFORMED) 2 VIEWS TECHNIQUE: Fluoroscopic spot image(s) were submitted for interpretation post-operatively. Radiation exposure index: 46 seconds. COMPARISON:  August 30, 2019. FINDINGS: The left acetabular and femoral components appear to be well situated. Expected postoperative changes are seen in the surrounding soft tissues. IMPRESSION: Fluoroscopic guidance provided during left total hip arthroplasty. Electronically Signed   By: Marijo Conception M.D.   On: 09/14/2019 13:56   Dg Hip Operative Unilat W Or W/o  Pelvis Left  Result Date: 09/14/2019 CLINICAL DATA:  Left hip replacement. EXAM: OPERATIVE left HIP (WITH PELVIS IF PERFORMED) 2 VIEWS TECHNIQUE: Fluoroscopic spot image(s) were submitted for interpretation post-operatively. Radiation exposure index: 46 seconds. COMPARISON:  August 30, 2019. FINDINGS: The left acetabular and femoral components appear to be well situated. Expected postoperative changes are seen in the  surrounding soft tissues. IMPRESSION: Fluoroscopic guidance provided during left total hip arthroplasty. Electronically Signed   By: Marijo Conception M.D.   On: 09/14/2019 13:56    Assessment and Plan:   1. Loletha Grayer down to 30 in OR with spinal anesthesia also with fentanyl - appears to be non conducted PACs Dr. Radford Pax to eval.--would monitor overnight - no prior episodes of dizziness, syncope, no angina, neg troponin  2. HTN controlled on lisiopril as outpt 3. Lt total hip today, doing well post op  4. HLD on zocor lipids followed by PCP 5. No CAD and neg nuc in 2015. + FH of CAD in father brothers and sister.       For questions or updates, please contact [place] Please consult [email or web address] for contact info under     Signed, Cecilie Kicks, NP  09/14/2019 3:31 PM

## 2019-09-14 NOTE — Consult Note (Signed)
H&P Physician requesting consult: Fayrene Helper  Chief Complaint: Urinary retention, difficult catheter  History of Present Illness: 78 year old male underwent a left total hip arthroplasty today for osteoarthritis.  Foley catheter was unable to be placed in the operating room due to resistance.  He subsequently went into postoperative retention.  He has been seen in our office before for gross hematuria by Dr. Junious Silk.  He had a hematuria work-up that revealed 2 bulbar urethral strictures that were dilated in the office.  No other abnormalities were found.  He states that he has had progressive worsening of urinary symptoms.  Past Medical History:  Diagnosis Date  . Attention deficit disorder   . Blind left eye   . Diabetes mellitus without complication ([place])   . Hyperlipidemia   . Hypertension    Past Surgical History:  Procedure Laterality Date  . EYE SURGERY Left    x3  . KNEE ARTHROSCOPY Bilateral 03/29/2014   Procedure: RIGHT KNEE ARTHROSCOPY AND DEBRIDEMENT, LEFT KNEE ASPIRATION,  LEFT KNEE ARTHROSCOPY AND DEBRIDEMENT.;  Surgeon: Meredith Pel, MD;  Location: [facility];  Service: Orthopedics;  Laterality: Bilateral;  RIGHT KNEE ARTHROSCOPY AND DEBRIDEMENT, LEFT KNEE ASPIRATION,  LEFT KNEE ARTHROSCOPY AND DEBRIDEMENT.  Marland Kitchen KNEE ARTHROTOMY Bilateral 04/26/2014   Procedure: KNEE ARTHROTOMY, SYNOVECTOMY, ANTIBIOTIC BEAD PLACEMENT.;  Surgeon: Meredith Pel, MD;  Location: [facility];  Service: Orthopedics;  Laterality: Bilateral;  . lipoma removal, right upper back    . TONSILLECTOMY      Home Medications:  Medications Prior to Admission  Medication Sig Dispense Refill Last Dose  . ampicillin (PRINCIPEN) 250 MG capsule Take 2 capsules (500 mg total) by mouth 4 (four) times daily for 2 days. 16 capsule 0 09/13/2019 at Unknown time  . aspirin EC 81 MG tablet Take 81 mg by mouth daily.   09/12/2019  . brimonidine (ALPHAGAN) 0.2 % ophthalmic solution Place 1 drop into the left eye 3  (three) times daily.    Past Month at Unknown time  . lisinopril (PRINIVIL,ZESTRIL) 20 MG tablet Take 20 mg by mouth daily.   Past Month at Unknown time  . naproxen sodium (ALEVE) 220 MG tablet Take 440 mg by mouth daily as needed (pain).   Past Month at Unknown time  . simvastatin (ZOCOR) 20 MG tablet Take 20 mg by mouth daily.   Past Month at Unknown time  . sodium chloride (MURO 128) 2 % ophthalmic solution Place 1 drop into the left eye 3 (three) times daily.   09/11/2019 at Unknown time  . sodium chloride (MURO 128) 5 % ophthalmic ointment Place 1 application into the left eye at bedtime.   09/11/2019 at Unknown time  . timolol (TIMOPTIC) 0.5 % ophthalmic solution Place 1 drop into the left eye 2 (two) times daily.    Past Month at Unknown time  . baclofen (LIORESAL) 10 MG tablet Take 0.5 tablets (5 mg total) by mouth 3 (three) times daily as needed for muscle spasms. (Patient not taking: Reported on 09/02/2019) 20 each 0 Not Taking at Unknown time  . cefTRIAXone 2 g in dextrose 5 % 50 mL Inject 2 g into the vein daily. Ceftriaxone 2gm IV daily for 6 wk using 04/27/14 as day of 1 of 42 End date will be July 22nd. Follow up in ID clinic in mid July to evaluate improvement. (Patient not taking: Reported on 09/02/2019)   Not Taking at Unknown time  . HYDROcodone-acetaminophen (NORCO) 10-325 MG per tablet Take 1 tablet by  mouth every 4 (four) hours as needed. (Patient not taking: Reported on 09/02/2019) 60 tablet 0 Not Taking at Unknown time  . menthol-cetylpyridinium (CEPACOL) 3 MG lozenge Take 1 lozenge (3 mg total) by mouth as needed for sore throat. (Patient not taking: Reported on 09/02/2019) 100 tablet 12 Not Taking at Unknown time  . rivaroxaban (XARELTO) 20 MG TABS tablet Take 1 tablet (20 mg total) by mouth daily with supper. Start taking 20 mg daily when the 21 days of 15 mg bid is complete (Patient not taking: Reported on 09/02/2019) 30 tablet 2 Not Taking at Unknown time   Allergies: No  Known Allergies  Family History  Problem Relation Age of Onset  . Heart attack Father   . Heart attack Sister   . Heart attack Brother    Social History:  reports that he has quit smoking. He has never used smokeless tobacco. He reports current alcohol use. He reports that he does not use drugs.  ROS: A complete review of systems was performed.  All systems are negative except for pertinent findings as noted. ROS   Physical Exam:  Vital signs in last 24 hours: Temp:  [96.8 F (36 C)-98 F (36.7 C)] 97.5 F (36.4 C) (10/27 1725) Pulse Rate:  [60-86] 86 (10/27 1725) Resp:  [15-25] 20 (10/27 1725) BP: (102-163)/(66-79) 146/79 (10/27 1725) SpO2:  [99 %-100 %] 100 % (10/27 1725) Weight:  [80.6 kg] 80.6 kg (10/27 0925) General:  Alert and oriented, No acute distress HEENT: Normocephalic, atraumatic Neck: No JVD or lymphadenopathy Cardiovascular: Regular rate and rhythm Lungs: Regular rate and effort Abdomen: Soft, nontender, nondistended, no abdominal masses Back: No CVA tenderness Genitourinary: Circumcised phallus. Extremities: No edema Neurologic: Grossly intact  Laboratory Data:  Results for orders placed or performed during the hospital encounter of 09/14/19 (from the past 24 hour(s))  Glucose, capillary     Status: Abnormal   Collection Time: 09/14/19  9:30 AM  Result Value Ref Range   Glucose-Capillary 129 (H) 70 - 99 mg/dL   Comment 1 Notify RN    Comment 2 Document in Chart   Glucose, capillary     Status: Abnormal   Collection Time: 09/14/19  2:13 PM  Result Value Ref Range   Glucose-Capillary 118 (H) 70 - 99 mg/dL  Troponin I (High Sensitivity)     Status: None   Collection Time: 09/14/19  2:39 PM  Result Value Ref Range   Troponin I (High Sensitivity) 14 <18 ng/L  Comprehensive metabolic panel     Status: Abnormal   Collection Time: 09/14/19  6:49 PM  Result Value Ref Range   Sodium 134 (L) 135 - 145 mmol/L   Potassium 3.8 3.5 - 5.1 mmol/L   Chloride  102 98 - 111 mmol/L   CO2 20 (L) 22 - 32 mmol/L   Glucose, Bld 169 (H) 70 - 99 mg/dL   BUN 10 8 - 23 mg/dL   Creatinine, Ser 1.20 0.61 - 1.24 mg/dL   Calcium 9.0 8.9 - 10.3 mg/dL   Total Protein 6.5 6.5 - 8.1 g/dL   Albumin 3.6 3.5 - 5.0 g/dL   AST 25 15 - 41 U/L   ALT 16 0 - 44 U/L   Alkaline Phosphatase 42 38 - 126 U/L   Total Bilirubin 1.0 0.3 - 1.2 mg/dL   GFR calc non Af Amer 58 (L) >60 mL/min   GFR calc Af Amer >60 >60 mL/min   Anion gap 12 5 - 15  CBC  Status: Abnormal   Collection Time: 09/14/19  6:49 PM  Result Value Ref Range   WBC 16.1 (H) 4.0 - 10.5 K/uL   RBC 4.79 4.22 - 5.81 MIL/uL   Hemoglobin 14.5 13.0 - 17.0 g/dL   HCT 44.9 39.0 - 52.0 %   MCV 93.7 80.0 - 100.0 fL   MCH 30.3 26.0 - 34.0 pg   MCHC 32.3 30.0 - 36.0 g/dL   RDW 12.7 11.5 - 15.5 %   Platelets 254 150 - 400 K/uL   nRBC 0.0 0.0 - 0.2 %  Lipase, blood     Status: None   Collection Time: 09/14/19  6:49 PM  Result Value Ref Range   Lipase 23 11 - 51 U/L   Recent Results (from the past 240 hour(s))  Urine culture     Status: Abnormal   Collection Time: 09/07/19 10:09 AM   Specimen: Urine, Clean Catch  Result Value Ref Range Status   Specimen Description URINE, CLEAN CATCH  Final   Special Requests   Final    NONE Performed at [facility], [place] [address], [place], [place] [address]    Culture MULTIPLE SPECIES PRESENT, SUGGEST RECOLLECTION (A)  Final   Report Status 09/09/2019 FINAL  Final  Surgical pcr screen     Status: None   Collection Time: 09/07/19 11:08 AM   Specimen: Nasal Mucosa; Nasal Swab  Result Value Ref Range Status   MRSA, PCR NEGATIVE NEGATIVE Final   Staphylococcus aureus NEGATIVE NEGATIVE Final    Comment: (NOTE) The Xpert SA Assay (FDA approved for NASAL specimens in patients 42 years of age and older), is one component of a comprehensive surveillance program. It is not intended to diagnose infection nor to guide or monitor treatment. Performed at [facility], [place] [address], [place], [place] [address]   Urine Culture     Status: Abnormal   Collection Time: 09/09/19  3:12 PM   Specimen: Urine  Result Value Ref Range Status   MICRO NUMBER: [id number]  Final   SPECIMEN QUALITY: Adequate  Final   Sample Source NOT GIVEN  Final   STATUS: FINAL  Final   ISOLATE 1: Enterococcus faecium (A)  Final    Comment: Greater than 100,000 CFU/mL of Enterococcus faecium      Susceptibility   Enterococcus faecium - URINE CULTURE POSITIVE 1    AMPICILLIN 8 Sensitive     VANCOMYCIN <=0.5 Sensitive     NITROFURANTOIN* 128 Resistant      * Legend:S = Susceptible  I = IntermediateR = Resistant  NS = Not susceptible* = Not tested  NR = Not reported**NN = See antimicrobic comments  Novel Coronavirus, NAA (Hosp order, Send-out to Ref Lab; TAT 18-24 hrs     Status: None   Collection Time: 09/10/19 10:32 AM   Specimen: Nasopharyngeal Swab; Respiratory  Result Value Ref Range Status   SARS-CoV-2, NAA NOT DETECTED NOT DETECTED Final    Comment: (NOTE) This nucleic acid amplification test was developed and its performance characteristics determined by [employer]. Nucleic acid amplification tests include PCR and TMA. This test has not been FDA cleared or approved. This test has been authorized by FDA under an Emergency Use Authorization (EUA). This test is only authorized for the duration of time the declaration that circumstances exist justifying the authorization of the emergency use of in vitro diagnostic tests for detection of SARS-CoV-2 virus and/or diagnosis of COVID-19 infection under section 564(b)(1) of the Act,  21 U.S.C. 360bbb-3(b) (1), unless the authorization is terminated or revoked sooner. When diagnostic testing is negative, the possibility of a false negative result should be considered in the context of a patient's recent exposures and the presence of clinical signs and symptoms consistent with COVID-19. An individual without  symptoms of COVID- 19 and who is not shedding SARS-CoV-2 vi rus would expect to have a negative (not detected) result in this assay. Performed At: [facility] [address] [place], [place] [id number] Rush Farmer MD [id number]    [place]  Final    Comment: Performed at [facility], [place] [address], [place], [place] [address]   Creatinine: Recent Labs    09/14/19 1849  CREATININE 1.20   Procedure: Urethral dilation with complex Foley catheter placement Under sterile conditions, I was able to easily pass a Glidewire into the bladder.  I then used dilators to sequentially dilate from 12 [place] up to 18 [place] over the wire.  I was then able to pass a 16 [place] council tip catheter over the wire and into the bladder.  The wire was withdrawn and attached to gravity drainage with return of 1100 cc.  10 cc of sterile water was instilled into the catheter balloon.  Impression/Assessment:  Bulbar urethral stricture Urinary retention  Plan:  Keep Foley catheter for 7 days followed by a void trial.  Continue antibiotics for a total of 7 days for treatment of UTI.  Marton Redwood, III 09/14/2019, 9:48 PM

## 2019-09-14 NOTE — Progress Notes (Signed)
Pt c/o severe abdominal pain.  Bilaterally.  Abdomen tender to touch.  Dr. Marlou Sa called and notified.  Plan to have medical doctor to see pt.

## 2019-09-14 NOTE — Op Note (Signed)
NAME: NAPOLEAN, SIDMAN MEDICAL RECORD [id number] ACCOUNT [id number] DATE OF BIRTH:1941-06-16 FACILITY: [facility] LOCATION: [facility] PHYSICIAN:Chet Greenley Randel Pigg, MD  OPERATIVE REPORT  DATE OF PROCEDURE:  09/14/2019  PREOPERATIVE DIAGNOSIS:  Left hip arthritis.  POSTOPERATIVE DIAGNOSIS:  Left hip arthritis.  PROCEDURE:  Left total hip replacement.  SURGEON:  Meredith Pel, MD  ASSISTANT:  Annie Main, PA.  IMPLANTS UTILIZED:  DePuy Actis femoral stem size 2, 36 cobalt chrome head with 0.5 neck, 52 mm Pinnacle cup with +4 liner offset and 1 cup screw.  INDICATIONS:  The patient is a 78 year old patient with end-stage left hip arthritis, who presents for operative management after explanation of risks and benefits.    PROCEDURE IN DETAIL:  The patient was brought to the operating room where spinal anesthetic was induced.  Preoperative antibiotics administered.  Timeout was called.  The patient was placed on the Hana bed with no traction.  Left hip was then prescrubbed  with alcohol and Betadine and allowed to air dry, prepped with DuraPrep solution and draped in sterile manner.  Ioban used to cover the operative field.  Timeout was called.  Fluoroscopy was utilized to make sure we had appropriate imaging angles for  the case.  Incision was then made, measuring about 10 cm from 2 distal and inferior to the anterior superior iliac crest.  Skin and subcutaneous tissue were sharply divided.  Fascia over the tensor fascia lata was divided along its midportion.  The  fascia was then elevated anteriorly.  The plane between the rectus and the tensor fascia lata was developed.  Crossing circumflex vessels were coagulated.  Retractors were placed on the superior and inferior aspect of the femoral neck.  A capsulotomy was  made beginning superolateral and taking it over the trochanteric ridge distally.  Once the femoral neck cut was made under fluoroscopic guidance, the head was removed.  The head cut  was deemed to be adequate.  At this time, the bent 90 retractor was  placed.  Circumferential labral removal was performed.  Pulvinar was released.  Next, the acetabulum was reamed, beginning at 45 mm and ending up at 51 mm.  This was reamed in approximately 40 degrees of abduction and 10 degrees of anteversion.  The cup  was placed with excellent purchase obtained.  Thorough irrigation the whole time was utilized using saline as well as IrriSept solution.  One screw was placed.  The +4 liner was placed.  Attention was then directed towards the femur.  The femur was  externally rotated about 130 degrees.  The calcar was extremely thick.  The conjoined tendon was released.  The trochanteric retractor was placed and the Mueller retractor was placed on the cut surface of the calcar.  The box cutter was then utilized.   The patient had exceedingly hard bone throughout the proximal femur.  Broaching was performed up to a size 2.  A size 2 fit very nicely.  Calcar planning performed.  Reduction was performed with the +2 trial and the 36 head at plus 1.5 mm offset.  This  gave excellent stability as well as near approximate leg lengths.  The trial components were removed, true components placed.  The patient had excellent stability with 90 degrees of hip flexion, as well as in adduction as well as with external rotation  in 60 degrees and 60 degrees of extension.  True components placed, the same stability parameters maintained.  Thorough irrigation was performed.  IrriSept solution was utilized.  Vancomycin  0.5 g was placed into the joint.  The rest of it was placed  above the capsular closure.  The capsule was then closed using #1 Vicryl suture, followed by further irrigation and closure of the fascia lata using #1 Vicryl suture.  Vancomycin powder also placed at this layer of closure.  Next, the skin was closed  using interrupted inverted 0 Vicryl suture, 2-0 Vicryl suture and a 3-0 Monocryl.  Steri-Strips and  Aquacel dressing placed.  Leg lengths approximately equal at the conclusion of the case.  The patient tolerated the procedure well without immediate  complications.  Luke's assistance was required at all times for opening and closing, retraction, limb reduction.  His assistance was a medical necessity.  VN/NUANCE  D:09/14/2019 T:09/14/2019 JOB:[id number]

## 2019-09-15 ENCOUNTER — Encounter (HOSPITAL_COMMUNITY): Payer: Self-pay | Admitting: General Practice

## 2019-09-15 ENCOUNTER — Other Ambulatory Visit: Payer: Self-pay | Admitting: Physician Assistant

## 2019-09-15 DIAGNOSIS — I491 Atrial premature depolarization: Secondary | ICD-10-CM

## 2019-09-15 DIAGNOSIS — R338 Other retention of urine: Secondary | ICD-10-CM | POA: Diagnosis not present

## 2019-09-15 DIAGNOSIS — Z419 Encounter for procedure for purposes other than remedying health state, unspecified: Secondary | ICD-10-CM | POA: Diagnosis not present

## 2019-09-15 DIAGNOSIS — I10 Essential (primary) hypertension: Secondary | ICD-10-CM | POA: Diagnosis not present

## 2019-09-15 LAB — LIPID PANEL
Cholesterol: 166 mg/dL (ref 0–200)
HDL: 78 mg/dL (ref >40)
LDL Cholesterol: 81 mg/dL (ref 0–99)
Total CHOL/HDL Ratio: 2.1 RATIO
Triglycerides: 33 mg/dL (ref <150)
VLDL: 7 mg/dL (ref 0–40)

## 2019-09-15 LAB — URINALYSIS, ROUTINE W REFLEX MICROSCOPIC
Bilirubin Urine: NEGATIVE
Glucose, UA: NEGATIVE mg/dL
Ketones, ur: NEGATIVE mg/dL
Leukocytes,Ua: NEGATIVE
Nitrite: NEGATIVE
Protein, ur: NEGATIVE mg/dL
RBC / HPF: >50 RBC/hpf — ABNORMAL HIGH (ref 0–5)
Specific Gravity, Urine: 1.009 (ref 1.005–1.030)
pH: 6 (ref 5.0–8.0)

## 2019-09-15 LAB — COMPREHENSIVE METABOLIC PANEL
ALT: 14 U/L (ref 0–44)
AST: 21 U/L (ref 15–41)
Albumin: 3 g/dL — ABNORMAL LOW (ref 3.5–5.0)
Alkaline Phosphatase: 34 U/L — ABNORMAL LOW (ref 38–126)
Anion gap: 8 (ref 5–15)
BUN: 13 mg/dL (ref 8–23)
CO2: 24 mmol/L (ref 22–32)
Calcium: 8.6 mg/dL — ABNORMAL LOW (ref 8.9–10.3)
Chloride: 102 mmol/L (ref 98–111)
Creatinine, Ser: 1.13 mg/dL (ref 0.61–1.24)
GFR calc Af Amer: >60 mL/min (ref >60)
GFR calc non Af Amer: >60 mL/min (ref >60)
Glucose, Bld: 133 mg/dL — ABNORMAL HIGH (ref 70–99)
Potassium: 4.2 mmol/L (ref 3.5–5.1)
Sodium: 134 mmol/L — ABNORMAL LOW (ref 135–145)
Total Bilirubin: 0.9 mg/dL (ref 0.3–1.2)
Total Protein: 5.5 g/dL — ABNORMAL LOW (ref 6.5–8.1)

## 2019-09-15 LAB — CBC
HCT: 36.4 % — ABNORMAL LOW (ref 39.0–52.0)
Hemoglobin: 12 g/dL — ABNORMAL LOW (ref 13.0–17.0)
MCH: 30.2 pg (ref 26.0–34.0)
MCHC: 33 g/dL (ref 30.0–36.0)
MCV: 91.5 fL (ref 80.0–100.0)
Platelets: 213 10*3/uL (ref 150–400)
RBC: 3.98 MIL/uL — ABNORMAL LOW (ref 4.22–5.81)
RDW: 12.8 % (ref 11.5–15.5)
WBC: 10.7 10*3/uL — ABNORMAL HIGH (ref 4.0–10.5)
nRBC: 0 % (ref 0.0–0.2)

## 2019-09-15 LAB — GLUCOSE, CAPILLARY
Glucose-Capillary: 135 mg/dL — ABNORMAL HIGH (ref 70–99)
Glucose-Capillary: 110 mg/dL — ABNORMAL HIGH (ref 70–99)
Glucose-Capillary: 131 mg/dL — ABNORMAL HIGH (ref 70–99)
Glucose-Capillary: 122 mg/dL — ABNORMAL HIGH (ref 70–99)

## 2019-09-15 LAB — URINE CULTURE: Culture: NO GROWTH

## 2019-09-15 LAB — MAGNESIUM: Magnesium: 1.7 mg/dL (ref 1.7–2.4)

## 2019-09-15 MED ORDER — CHLORHEXIDINE GLUCONATE CLOTH 2 % EX PADS
6.0000 | MEDICATED_PAD | Freq: Every day | CUTANEOUS | Status: DC
  Administered 2019-09-15 – 2019-09-17 (×3): 6 via TOPICAL

## 2019-09-15 NOTE — Progress Notes (Signed)
Physical Therapy Treatment Patient Details Name: [name] MRN: [id number] DOB: 1940-12-10 Today's Date: 09/15/2019    History of Present Illness Pt is a pleasant 78 yo male presenting s/p elective L THR (10/27) due to chronic arthritis that failed conservative measures. 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 Current admission complicated by occurence of heart block in OR and acute urine retention.    PT Comments    Pt was in bed upon PT arrival, but stated that he stayed up in recliner for about 1 hour. Pt demos improved tolerance for ambulation, and was able to walk 25 ft with use of RW and minA despite still ambulating with sig decreased gait speed and poor LE clearance. Pt still has some difficulty with bed mobility due to pain, but demos good ability to complete both seated and supine LE exercises. Pt and his wife were educated in total hip HEP, and were able to demonstrate good technique with exercises. Recommend continued skilled PT to maximize functional mobility and independence before d/c.    Follow Up Recommendations  Home health PT;Supervision/Assistance - 24 hour     Equipment Recommendations  Rolling walker with 5" wheels;3in1 (PT)    Recommendations for Other Services       Precautions / Restrictions Precautions Precautions: Anterior Hip;Fall Precaution Booklet Issued: Yes (comment) Restrictions Weight Bearing Restrictions: Yes LLE Weight Bearing: Weight bearing as tolerated    Mobility  Bed Mobility Overal bed mobility: Needs Assistance Bed Mobility: Supine to Sit     Supine to sit: Min assist     General bed mobility comments: assist for management of LLE, minA for elevation of trunk even with elevated HOB, pain limited  Transfers Overall transfer level: Needs assistance Equipment used: Rolling walker (2 wheeled) Transfers: Sit to/from Stand Sit to Stand: Min assist         General transfer comment:  pt requires minA to stand from elevated bed and minA to return to sitting in recliner. Pt also benefits from VCs for hand positioning  Ambulation/Gait Ambulation/Gait assistance: Min assist Gait Distance (Feet): 25 Feet Assistive device: Rolling walker (2 wheeled) Gait Pattern/deviations: Step-to pattern;Decreased step length - left;Decreased step length - right;Decreased stance time - left;Decreased dorsiflexion - right;Decreased dorsiflexion - left Gait velocity: decreased; < 0.1 Gait velocity interpretation: <1.31 ft/sec, indicative of household ambulator General Gait Details: Pt with sig increased WB on BUE due to pain, poor clearance of BLE with absent heel-toe pattern.   Stairs             Wheelchair Mobility    Modified Rankin (Stroke Patients Only)       Balance Overall balance assessment: Needs assistance Sitting-balance support: Feet supported Sitting balance-Leahy Scale: Good Sitting balance - Comments: pain-limited. Pt uses UE to off-weight surgical leg   Standing balance support: Bilateral upper extremity supported Standing balance-Leahy Scale: Poor                              Cognition Arousal/Alertness: Awake/alert Behavior During Therapy: WFL for tasks assessed/performed Overall Cognitive Status: Within Functional Limits for tasks assessed                                        Exercises Total Joint Exercises Ankle Circles/Pumps: AROM;10 reps;Both Quad Sets:  AROM;Both;10 reps Heel Slides: AROM;10 reps;Both    General Comments        Pertinent Vitals/Pain Pain Assessment: 0-10 Pain Score: 10-Worst pain ever Pain Location: pain increased sig with ambulation and bed mobility, ceased with rest Pain Descriptors / Indicators: Sore;Grimacing Pain Intervention(s): Limited activity within patient's tolerance;Monitored during session;Repositioned;Patient requesting pain meds-RN notified    Home Living Family/patient  expects to be discharged to:: Private residence Living Arrangements: Spouse/significant other                  Prior Function            PT Goals (current goals can now be found in the care plan section) Acute Rehab PT Goals Patient Stated Goal: return home PT Goal Formulation: With patient/family Time For Goal Achievement: 09/29/19 Potential to Achieve Goals: Good Progress towards PT goals: Progressing toward goals    Frequency    7X/week      PT Plan Current plan remains appropriate    Co-evaluation              AM-PAC PT "6 Clicks" Mobility   Outcome Measure  Help needed turning from your back to your side while in a flat bed without using bedrails?: A Little Help needed moving from lying on your back to sitting on the side of a flat bed without using bedrails?: A Little Help needed moving to and from a bed to a chair (including a wheelchair)?: A Little Help needed standing up from a chair using your arms (e.g., wheelchair or bedside chair)?: A Little Help needed to walk in hospital room?: A Little Help needed climbing 3-5 steps with a railing? : A Lot 6 Click Score: 17    End of Session Equipment Utilized During Treatment: Gait belt Activity Tolerance: Patient tolerated treatment well;Patient limited by pain Patient left: with family/visitor present;in bed;with call bell/phone within reach Nurse Communication: Mobility status;Patient requests pain meds PT Visit Diagnosis: Other abnormalities of gait and mobility (R26.89);Unsteadiness on feet (R26.81);Muscle weakness (generalized) (M62.81);Pain Pain - Right/Left: Left Pain - part of body: Hip     Time: [id number] PT Time Calculation (min) (ACUTE ONLY): 25 min  Charges:  $Gait Training: 8-22 mins $Therapeutic Exercise: 8-22 mins $Therapeutic Activity: 8-22 mins                     Mickey Farber, PT, DPT   Acute Rehabilitation Department [phone]   Otho Bellows 09/15/2019, 3:26  PM

## 2019-09-15 NOTE — Discharge Instructions (Signed)

## 2019-09-15 NOTE — Evaluation (Signed)
Physical Therapy Evaluation Patient Details Name: [name] MRN: [id number] DOB: [birth year] Today's Date: 09/15/2019   History of Present Illness  Pt is a pleasant 78 yo male presenting s/p elective L THR (10/27) due to chronic arthritis that failed conservative measures. 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 Current admission complicated by occurence of heart block in OR and acute urine retention.  Clinical Impression  Pt is awake and in bed upon PT arrival, willing to participate in PT session. During the session, the pt was educated on their anterior hip precautions and WB status, and was able to demo transfers and ambulation while maintaining precautions/weightbearing status. The patient will continue to benefit from mobility and transfer training to reinforce safe behavior. Pt demos good initiation of bed mobility and transfers, but is significantly pain-limited at this time and needed min/modA to complete tasks. Pt ambulation is also limited to short distances in room with RW and minA of 1 to complete. Pt will continue to benefit from skilled PT to address functional and mobility limtiations prior to d/c from hospital.    Follow Up Recommendations Home health PT;Supervision/Assistance - 24 hour    Equipment Recommendations  Rolling walker with 5" wheels;3in1 (PT)    Recommendations for Other Services       Precautions / Restrictions Precautions Precautions: Anterior Hip;Fall Precaution Booklet Issued: Yes (comment) Restrictions Weight Bearing Restrictions: Yes LLE Weight Bearing: Weight bearing as tolerated      Mobility  Bed Mobility Overal bed mobility: Needs Assistance Bed Mobility: Supine to Sit     Supine to sit: Min assist     General bed mobility comments: assist for management of LLE, minA for elevation of trunk even with elevated HOB, pain limited  Transfers Overall transfer level: Needs assistance Equipment  used: Rolling walker (2 wheeled) Transfers: Sit to/from Stand Sit to Stand: Mod assist         General transfer comment: pt requires modA to stand from elevated bed and minA to return to sitting in recliner. Pt also benefits from VCs for hand positioning  Ambulation/Gait Ambulation/Gait assistance: Min assist Gait Distance (Feet): 5 Feet Assistive device: Rolling walker (2 wheeled) Gait Pattern/deviations: Step-to pattern;Decreased step length - left;Decreased step length - right;Decreased stance time - left;Decreased dorsiflexion - right;Decreased dorsiflexion - left Gait velocity: decreased Gait velocity interpretation: <1.31 ft/sec, indicative of household ambulator General Gait Details: Pt with sig increased WB on BUE due to pain, poor clearance of BLE with absent heel-toe pattern.  Stairs            Wheelchair Mobility    Modified Rankin (Stroke Patients Only)       Balance Overall balance assessment: Needs assistance Sitting-balance support: Feet supported Sitting balance-Leahy Scale: Good     Standing balance support: Bilateral upper extremity supported Standing balance-Leahy Scale: Poor                               Pertinent Vitals/Pain Pain Assessment: Faces Faces Pain Scale: Hurts whole lot Pain Location: no pain prior to session,  pain during mobility that ceased with rest Pain Descriptors / Indicators: Sore;Grimacing Pain Intervention(s): Repositioned;Limited activity within patient's tolerance;Ice applied    Home Living Family/patient expects to be discharged to:: Private residence Living Arrangements: Spouse/significant other Available Help at Discharge: Family(wife 24/7) Type of Home: House Home Access: Stairs to enter Entrance Stairs-Rails:  None Entrance Stairs-Number of Steps: 4 at front, 2 in back, no rail either Home Layout: One level Home Equipment: Cane - single point      Prior Function Level of Independence: Needs  assistance   Gait / Transfers Assistance Needed: walking with SPC, able to drive and complete ADLs and IADLs  ADL's / Homemaking Assistance Needed: wife assist for IADLs        Hand Dominance   Dominant Hand: Right    Extremity/Trunk Assessment   Upper Extremity Assessment Upper Extremity Assessment: Overall WFL for tasks assessed    Lower Extremity Assessment Lower Extremity Assessment: Generalized weakness;LLE deficits/detail(Pt was able to complete transfers, but required assist, unsure if due to limitations of pain or strength) LLE: Unable to fully assess due to pain    Cervical / Trunk Assessment Cervical / Trunk Assessment: Normal  Communication   Communication: No difficulties  Cognition Arousal/Alertness: Awake/alert Behavior During Therapy: WFL for tasks assessed/performed Overall Cognitive Status: Within Functional Limits for tasks assessed                                        General Comments      Exercises     Assessment/Plan    PT Assessment Patient needs continued PT services  PT Problem List Decreased strength;Decreased mobility;Decreased range of motion;Decreased activity tolerance;Decreased balance;Pain       PT Treatment Interventions Gait training;Therapeutic exercise;Balance training;Stair training;Functional mobility training;Therapeutic activities;Patient/family education    PT Goals (Current goals can be found in the Care Plan section)  Acute Rehab PT Goals Patient Stated Goal: return home PT Goal Formulation: With patient/family Time For Goal Achievement: 09/15/19 Potential to Achieve Goals: Good    Frequency 7X/week   Barriers to discharge        Co-evaluation               AM-PAC PT "6 Clicks" Mobility  Outcome Measure Help needed turning from your back to your side while in a flat bed without using bedrails?: A Little Help needed moving from lying on your back to sitting on the side of a flat bed  without using bedrails?: A Little Help needed moving to and from a bed to a chair (including a wheelchair)?: A Little Help needed standing up from a chair using your arms (e.g., wheelchair or bedside chair)?: A Little Help needed to walk in hospital room?: A Little Help needed climbing 3-5 steps with a railing? : A Lot 6 Click Score: 17    End of Session Equipment Utilized During Treatment: Gait belt Activity Tolerance: Patient tolerated treatment well;Patient limited by pain Patient left: in chair;with call bell/phone within reach;with chair alarm set;with family/visitor present Nurse Communication: Mobility status PT Visit Diagnosis: Other abnormalities of gait and mobility (R26.89);Unsteadiness on feet (R26.81);Muscle weakness (generalized) (M62.81);Pain Pain - Right/Left: Left Pain - part of body: Hip    Time: 1051-1130 PT Time Calculation (min) (ACUTE ONLY): 39 min   Charges:   PT Evaluation $PT Eval Low Complexity: 1 Low PT Treatments $Gait Training: 8-22 mins $Therapeutic Activity: 8-22 mins        Mickey Farber, PT, DPT   Acute Rehabilitation Department [phone]  Otho Bellows 09/15/2019, 11:40 AM

## 2019-09-15 NOTE — Progress Notes (Signed)
Progress Note  Patient Name: [name] Date of Encounter: 09/15/2019  Primary Cardiologist:  Belva Crome III, MD  Subjective   No chest pain or SOB. No hx palpitations, no hx presyncope or syncope. Never been told HR is irregular or low.  Wants to go home  Inpatient Medications    Scheduled Meds: . amoxicillin  500 mg Oral Q8H  . brimonidine  1 drop Left Eye TID  . docusate sodium  100 mg Oral BID  . insulin aspart  0-5 Units Subcutaneous QHS  . insulin aspart  0-9 Units Subcutaneous TID WC  . lidocaine  1 application Urethral Once  . rivaroxaban  10 mg Oral Q24H  . sodium chloride  1 drop Left Eye TID  . sodium chloride  1 application Left Eye QHS  . tamsulosin  0.4 mg Oral Daily  . timolol  1 drop Left Eye BID  . traMADol  50 mg Oral Q6H   Continuous Infusions: .  ceFAZolin (ANCEF) IV 2 g (09/15/19 0540)  . methocarbamol (ROBAXIN) IV     PRN Meds: acetaminophen, HYDROmorphone (DILAUDID) injection, methocarbamol **OR** methocarbamol (ROBAXIN) IV, metoCLOPramide **OR** metoCLOPramide (REGLAN) injection, ondansetron **OR** ondansetron (ZOFRAN) IV, oxyCODONE   Vital Signs    Vitals:   09/14/19 1708 09/14/19 1725 09/15/19 0039 09/15/19 0421  BP: (!) 143/79 (!) 146/79 (!) 120/59 (!) 109/35  Pulse: 81 86 74 72  Resp: (!) 25 20 17 16   Temp: (!) 96.8 F (36 C) (!) 97.5 F (36.4 C) 98.7 F (37.1 C) 98.3 F (36.8 C)  TempSrc:  Oral Oral Oral  SpO2: 99% 100% 100% 98%  Weight:      Height:        Intake/Output Summary (Last 24 hours) at 09/15/2019 0708 Last data filed at 09/15/2019 0300 Gross per 24 hour  Intake 2079.34 ml  Output 1050 ml  Net 1029.34 ml   Filed Weights   09/14/19 0925  Weight: 80.6 kg   Last Weight  Most recent update: 09/14/2019  9:55 AM   Weight  80.6 kg (177 lb 9.6 oz)           Weight change:    Telemetry    SR, baseline HR never < 65, HR drops at times due to non-conducted PACs, 1-2 bts - Personally Reviewed  ECG    None today - Personally Reviewed  Physical Exam   General: Well developed, well nourished, male appearing in no acute distress. Head: Normocephalic, atraumatic.  Neck: Supple without bruits, JVD not elevated. Lungs:  Resp regular and unlabored, CTA. Heart: RRR, S1, S2, no S3, S4, or murmur; no rub. Abdomen: Soft, non-tender, non-distended with normoactive bowel sounds. No hepatomegaly. No rebound/guarding. No obvious abdominal masses. Extremities: No clubbing, cyanosis, no edema. Distal pedal pulses are 2+ bilaterally. Incision w/out drainage Neuro: Alert and oriented X 3. Moves all extremities spontaneously. Psych: Normal affect.  Labs    Hematology Recent Labs  Lab 09/14/19 1849 09/15/19 0323  WBC 16.1* 10.7*  RBC 4.79 3.98*  HGB 14.5 12.0*  HCT 44.9 36.4*  MCV 93.7 91.5  MCH 30.3 30.2  MCHC 32.3 33.0  RDW 12.7 12.8  PLT 254 213    Chemistry Recent Labs  Lab 09/14/19 1849 09/15/19 0323  NA 134* 134*  K 3.8 4.2  CL 102 102  CO2 20* 24  GLUCOSE 169* 133*  BUN 10 13  CREATININE 1.20 1.13  CALCIUM 9.0 8.6*  PROT 6.5 5.5*  ALBUMIN 3.6 3.0*  AST 25 21  ALT 16 14  ALKPHOS 42 34*  BILITOT 1.0 0.9  GFRNONAA 58* >60  GFRAA >60 >60  ANIONGAP 12 8     High Sensitivity Troponin:   Recent Labs  Lab 09/14/19 1439  TROPONINIHS 14      Radiology    Dg C-arm 1-60 Min  Result Date: 09/14/2019 CLINICAL DATA:  Left hip replacement. EXAM: OPERATIVE left HIP (WITH PELVIS IF PERFORMED) 2 VIEWS TECHNIQUE: Fluoroscopic spot image(s) were submitted for interpretation post-operatively. Radiation exposure index: 46 seconds. COMPARISON:  August 30, 2019. FINDINGS: The left acetabular and femoral components appear to be well situated. Expected postoperative changes are seen in the surrounding soft tissues. IMPRESSION: Fluoroscopic guidance provided during left total hip arthroplasty. Electronically Signed   By: Marijo Conception M.D.   On: 09/14/2019 13:56   Dg Hip Port  Unilat With Pelvis 1v Left  Result Date: 09/14/2019 CLINICAL DATA:  Post total left hip arthroplasty EXAM: DG HIP (WITH OR WITHOUT PELVIS) 1V PORT LEFT COMPARISON:  Intraoperative fluoroscopy 09/14/2019, comparison radiograph 08/30/2019 FINDINGS: Patient has undergone total left hip arthroplasty with normal alignment of the articulating femoral stem in the screw fixed acetabular component. Expected postsurgical soft tissue changes including soft tissue gas and intra-articular gas are noted. Mild-to-moderate degenerative changes are again seen in the right hip. Bones of the pelvis are otherwise congruent. Vascular calcium noted in the medial thighs. Remaining soft tissues are unremarkable. IMPRESSION: Normal postoperative appearance of the left total hip arthroplasty without evidence of acute hardware complication. Electronically Signed   By: Lovena Le M.D.   On: 09/14/2019 16:58   Dg Hip Operative Unilat W Or W/o Pelvis Left  Result Date: 09/14/2019 CLINICAL DATA:  Left hip replacement. EXAM: OPERATIVE left HIP (WITH PELVIS IF PERFORMED) 2 VIEWS TECHNIQUE: Fluoroscopic spot image(s) were submitted for interpretation post-operatively. Radiation exposure index: 46 seconds. COMPARISON:  August 30, 2019. FINDINGS: The left acetabular and femoral components appear to be well situated. Expected postoperative changes are seen in the surrounding soft tissues. IMPRESSION: Fluoroscopic guidance provided during left total hip arthroplasty. Electronically Signed   By: Marijo Conception M.D.   On: 09/14/2019 13:56     Cardiac Studies   None this admit  Patient Profile     78 y.o. male w/ hx  HTN, DM, HLD and premature CAD in father and siblings who was admitted 10/27 for L-THR, was seen by cards for Heart block in OR.  Assessment & Plan    1. Heart block in OR: - decreased HR 2nd non-conducted PACs - this was more pronounced in OR, ?decreased conductivity due to anesthesia - no hx presyncope or syncope  - no significant arrhythmias on tele overnight - only BB/CCB pta is timolol eye gtts - MD advise if any further eval needed, otherwise, will sign off.  2. OA, s/p L-THR - per Ortho  3. Abdominal pain - had some abdominal pain post-op>> urinary retention>>foley cath - per IM   Otherwise, per Ortho & IM Active Problems:   Essential hypertension   Diabetes mellitus type II, controlled ([place])   Hyperlipidemia   Acute urinary retention  Jonetta Speak , PA-C 7:08 AM 09/15/2019 Pager: [phone]

## 2019-09-15 NOTE — Progress Notes (Signed)
  Subjective: [name] is a 78 y.o. male s/p left THA.  They are POD1.  Pt's pain is controlled.  Patient has not had a PT eval yet.  Denies any current abd pain and states that his pain has improved immensely since the placement of foley catheter.     Objective: Vital signs in last 24 hours: Temp:  [96.8 F (36 C)-98.7 F (37.1 C)] 98.3 F (36.8 C) (10/28 0421) Pulse Rate:  [60-86] 72 (10/28 0421) Resp:  [15-25] 16 (10/28 0421) BP: (102-146)/(35-79) 109/35 (10/28 0421) SpO2:  [98 %-100 %] 98 % (10/28 0421)  Intake/Output from previous day: 10/27 0701 - 10/28 0700 In: 2079.3 [P.O.:120; I.V.:1759.3; IV Piggyback:200] Out: 1050 [Urine:1000; Blood:50] Intake/Output this shift: Total I/O In: -  Out: 1000 [Urine:1000]  Exam:  No gross blood or drainage overlying the dressing 2+ DP pulse Sensation intact distally in the left foot Able to dorsiflex and plantarflex the left foot Foley catheter in place   Labs: Recent Labs    09/14/19 1849 09/15/19 0323  HGB 14.5 12.0*   Recent Labs    09/14/19 1849 09/15/19 0323  WBC 16.1* 10.7*  RBC 4.79 3.98*  HCT 44.9 36.4*  PLT 254 213   Recent Labs    09/14/19 1849 09/15/19 0323  NA 134* 134*  K 3.8 4.2  CL 102 102  CO2 20* 24  BUN 10 13  CREATININE 1.20 1.13  GLUCOSE 169* 133*  CALCIUM 9.0 8.6*   No results for input(s): LABPT, INR in the last 72 hours.  Assessment/Plan: Pt is POD1 s/p left THA.    -Plan to discharge to home today or tomorrow pending patient's pain and PT eval  -WBAT with a walker  -Okay to shower, dressing is waterproof.  Cautioned patient against soaking dressing in bath/pool/body of water  -Cardiology signed off, appreciate their consult and input  -Plan for Urology follow-up in ~6 days for foley catheter removal.   Will have patient discharged with 7 days of oral antibiotics for UTI treatment upon discharge.  Continue IV abx until then.       [name] L [name] 09/15/2019, 11:17 AM

## 2019-09-15 NOTE — Anesthesia Postprocedure Evaluation (Signed)
Anesthesia Post Note  Patient: [name]  Procedure(s) Performed: LEFT TOTAL HIP ARTHROPLASTY ANTERIOR APPROACH (Left Hip)     Patient location during evaluation: PACU Anesthesia Type: Spinal Level of consciousness: oriented and awake and alert Pain management: pain level controlled Vital Signs Assessment: post-procedure vital signs reviewed and stable Respiratory status: spontaneous breathing, respiratory function stable and patient connected to nasal cannula oxygen Cardiovascular status: blood pressure returned to baseline and stable Postop Assessment: no headache, no backache and no apparent nausea or vomiting Anesthetic complications: no    Last Vitals:  Vitals:   09/15/19 0039 09/15/19 0421  BP: (!) 120/59 (!) 109/35  Pulse: 74 72  Resp: 17 16  Temp: 37.1 C 36.8 C  SpO2: 100% 98%    Last Pain:  Vitals:   09/15/19 0958  TempSrc:   PainSc: [place]

## 2019-09-15 NOTE — Plan of Care (Signed)
  Problem: Safety: Goal: Ability to remain free from injury will improve Outcome: Progressing   Problem: Pain Managment: Goal: General experience of comfort will improve Outcome: Progressing   Problem: Elimination: Goal: Will not experience complications related to bowel motility Outcome: Progressing   Problem: Clinical Measurements: Goal: Cardiovascular complication will be avoided Outcome: Progressing

## 2019-09-15 NOTE — Progress Notes (Signed)
Upon shift arrival this pt was in pain and anxious r/t urinary retention. Per dayshift nurse, OR staff unable to place foley cath. Per dayshift nurse 4N nurse (coude team) to place coude cath. Call out to covering MD, coude placement unsuccessful. Dr. Baltazar Najjar and Dr. Florene Glen with call back . Urologist consulted, urology cart obtained from the OR and placed at the bedside. Coude placed by MD. Return or urine at 1062ml. UA sent. Noted pulse currently at SR 73.

## 2019-09-15 NOTE — Progress Notes (Signed)
PROGRESS NOTE    AWESOME LYNG  [id number] DOB: 1941/02/22 DOA: 09/14/2019 PCP: Kathyrn Lass, MD  Brief Narrative: [name] is an 78 y.o. 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   -He underwent left hip total arthroplasty yesterday by Dr. Marlou Sa, later the afternoon he was found to have acute abdominal pain due to urinary retention, TRH and urology were consulted  Assessment & Plan:   Acute urinary retention, UTI, acute abdominal pain -Postop urinary retention in the setting of known urethral stricture -Eventually had to be seen by urology and underwent complex Foley catheter placement with urethral dilation yesterday 10/27 PM by Dr. Gloriann Loan -Recommended to keep Foley in for another week, with urology follow-up for voiding trial -Also with Enterococcus UTI on urine culture-continue amoxicillin for 7 days -Continue Flomax  Enterococcus UTI: -As above   Bradycardia: cardiology following, appreciate recs.  Sounds like this happened in OR in setting of spinal anesthesia and fentanyl.  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  Occurred in 2015 -Plan for Xarelto at discharge for prophylaxis  Hx T2DM: a1c 6.6.  -Diet controlled   DVT prophylaxis: Xarelto Code Status: Full code Family Communication: No family at bedside Disposition Plan: Home soon   Procedures:   Antimicrobials:    Subjective: -Feels better today, was able to eat breakfast, worried about going home with the catheter  Objective: Vitals:   09/14/19 1708 09/14/19 1725  09/15/19 0039 09/15/19 0421  BP: (!) 143/79 (!) 146/79 (!) 120/59 (!) 109/35  Pulse: 81 86 74 72  Resp: (!) 25 20 17 16   Temp: (!) 96.8 F (36 C) (!) 97.5 F (36.4 C) 98.7 F (37.1 C) 98.3 F (36.8 C)  TempSrc:  Oral Oral Oral  SpO2: 99% 100% 100% 98%  Weight:      Height:        Intake/Output Summary (Last 24 hours) at 09/15/2019 1231 Last data filed at 09/15/2019 1000 Gross per 24 hour  Intake 2079.34 ml  Output 2050 ml  Net 29.34 ml   Filed Weights   09/14/19 0925  Weight: 80.6 kg    Examination:  General exam: AAO x3, no distress Respiratory system: Clear  cardiovascular system: S1 & S2 heard, RRR.  Gastrointestinal system: Abdomen is nondistended, soft and nontender.Normal bowel sounds heard. Central nervous system: Alert and oriented. No focal neurological deficits. Extremities: No edema, right hip with dressing Skin: No rashes, lesions or ulcers Psychiatry: Judgement and insight appear normal. Mood & affect appropriate.     Data Reviewed:   CBC: Recent Labs  Lab 09/14/19 1849 09/15/19 0323  WBC 16.1* 10.7*  HGB 14.5 12.0*  HCT 44.9 36.4*  MCV 93.7 91.5  PLT 254 [id number]   Basic Metabolic Panel: Recent Labs  Lab 09/14/19 1849 09/15/19 0323  NA 134* 134*  K 3.8 4.2  CL 102 102  CO2 20* 24  GLUCOSE 169* 133*  BUN 10 13  CREATININE 1.20 1.13  CALCIUM 9.0 8.6*  MG  --  1.7   GFR: Estimated Creatinine Clearance: 55.7  mL/min (by C-G formula based on SCr of 1.13 mg/dL). Liver Function Tests: Recent Labs  Lab 09/14/19 1849 09/15/19 0323  AST 25 21  ALT 16 14  ALKPHOS 42 34*  BILITOT 1.0 0.9  PROT 6.5 5.5*  ALBUMIN 3.6 3.0*   Recent Labs  Lab 09/14/19 1849  LIPASE 23   No results for input(s): AMMONIA in the last 168 hours. Coagulation Profile: No results for input(s): INR, PROTIME in the last 168 hours. Cardiac Enzymes: No results for input(s): CKTOTAL, CKMB, CKMBINDEX, TROPONINI in the last 168 hours. BNP (last 3 results) No  results for input(s): PROBNP in the last 8760 hours. HbA1C: No results for input(s): HGBA1C in the last 72 hours. CBG: Recent Labs  Lab 09/14/19 0930 09/14/19 1413 09/14/19 2313 09/15/19 0704 09/15/19 1207  GLUCAP 129* 118* 148* 135* 110*   Lipid Profile: Recent Labs    09/15/19 0323  CHOL 166  HDL 78  LDLCALC 81  TRIG 33  CHOLHDL 2.1   Thyroid Function Tests: No results for input(s): TSH, T4TOTAL, FREET4, T3FREE, THYROIDAB in the last 72 hours. Anemia Panel: No results for input(s): VITAMINB12, FOLATE, FERRITIN, TIBC, IRON, RETICCTPCT in the last 72 hours. Urine analysis:    Component Value Date/Time   COLORURINE YELLOW 09/14/2019 2335   APPEARANCEUR CLEAR 09/14/2019 2335   LABSPEC 1.009 09/14/2019 2335   PHURINE 6.0 09/14/2019 2335   GLUCOSEU NEGATIVE 09/14/2019 2335   HGBUR LARGE (A) 09/14/2019 2335   BILIRUBINUR NEGATIVE 09/14/2019 2335   KETONESUR NEGATIVE 09/14/2019 2335   PROTEINUR NEGATIVE 09/14/2019 2335   UROBILINOGEN 0.2 04/29/2014 1111   NITRITE NEGATIVE 09/14/2019 2335   LEUKOCYTESUR NEGATIVE 09/14/2019 2335   Sepsis Labs: @LABRCNTIP (procalcitonin:4,lacticidven:4)  ) Recent Results (from the past 240 hour(s))  Urine culture     Status: Abnormal   Collection Time: 09/07/19 10:09 AM   Specimen: Urine, Clean Catch  Result Value Ref Range Status   Specimen Description URINE, CLEAN CATCH  Final   Special Requests   Final    NONE Performed at [facility], [place] [address], [place], [place] [address]    Culture MULTIPLE SPECIES PRESENT, SUGGEST RECOLLECTION (A)  Final   Report Status 09/09/2019 FINAL  Final  Surgical pcr screen     Status: None   Collection Time: 09/07/19 11:08 AM   Specimen: Nasal Mucosa; Nasal Swab  Result Value Ref Range Status   MRSA, PCR NEGATIVE NEGATIVE Final   Staphylococcus aureus NEGATIVE NEGATIVE Final    Comment: (NOTE) The Xpert SA Assay (FDA approved for NASAL specimens in patients 72 years of age and older),  is one component of a comprehensive surveillance program. It is not intended to diagnose infection nor to guide or monitor treatment. Performed at [facility], [place] [address] [place],  [address]   Urine Culture     Status: Abnormal   Collection Time: 09/09/19  3:12 PM   Specimen: Urine  Result Value Ref Range Status   MICRO NUMBER: [id number]  Final   SPECIMEN QUALITY: Adequate  Final   Sample Source NOT GIVEN  Final   STATUS: FINAL  Final   ISOLATE 1: Enterococcus faecium (A)  Final    Comment: Greater than 100,000 CFU/mL of Enterococcus faecium      Susceptibility   Enterococcus faecium - URINE CULTURE POSITIVE 1    AMPICILLIN 8 Sensitive     VANCOMYCIN <=0.5 Sensitive     NITROFURANTOIN* 128 Resistant      *  Legend:S = Susceptible  I = IntermediateR = Resistant  NS = Not susceptible* = Not tested  NR = Not reported**NN = See antimicrobic comments  Novel Coronavirus, NAA (Hosp order, Send-out to Ref Lab; TAT 18-24 hrs     Status: None   Collection Time: 09/10/19 10:32 AM   Specimen: Nasopharyngeal Swab; Respiratory  Result Value Ref Range Status   SARS-CoV-2, NAA NOT DETECTED NOT DETECTED Final    Comment: (NOTE) This nucleic acid amplification test was developed and its performance characteristics determined by [employer]. Nucleic acid amplification tests include PCR and TMA. This test has not been FDA cleared or approved. This test has been authorized by FDA under an Emergency Use Authorization (EUA). This test is only authorized for the duration of time the declaration that circumstances exist justifying the authorization of the emergency use of in vitro diagnostic tests for detection of SARS-CoV-2 virus and/or diagnosis of COVID-19 infection under section 564(b)(1) of the Act, 21 U.S.C. [id number]) (1), unless the authorization is terminated or revoked sooner. When diagnostic testing is negative, the possibility of a false negative result should be  considered in the context of a patient's recent exposures and the presence of clinical signs and symptoms consistent with COVID-19. An individual without symptoms of COVID- 19 and who is not shedding SARS-CoV-2 vi rus would expect to have a negative (not detected) result in this assay. Performed At: [facility] [address] [place], [place] [id number] Rush Farmer MD [id number]    [place]  Final    Comment: Performed at [facility], [place] [address] [place], [place] [address]         Radiology Studies: Dg C-arm 1-60 Min  Result Date: 09/14/2019 CLINICAL DATA:  Left hip replacement. EXAM: OPERATIVE left HIP (WITH PELVIS IF PERFORMED) 2 VIEWS TECHNIQUE: Fluoroscopic spot image(s) were submitted for interpretation post-operatively. Radiation exposure index: 46 seconds. COMPARISON:  August 30, 2019. FINDINGS: The left acetabular and femoral components appear to be well situated. Expected postoperative changes are seen in the surrounding soft tissues. IMPRESSION: Fluoroscopic guidance provided during left total hip arthroplasty. Electronically Signed   By: Marijo Conception M.D.   On: 09/14/2019 13:56   Dg Hip Port Unilat With Pelvis 1v Left  Result Date: 09/14/2019 CLINICAL DATA:  Post total left hip arthroplasty EXAM: DG HIP (WITH OR WITHOUT PELVIS) 1V PORT LEFT COMPARISON:  Intraoperative fluoroscopy 09/14/2019, comparison radiograph 08/30/2019 FINDINGS: Patient has undergone total left hip arthroplasty with normal alignment of the articulating femoral stem in the screw fixed acetabular component. Expected postsurgical soft tissue changes including soft tissue gas and intra-articular gas are noted. Mild-to-moderate degenerative changes are again seen in the right hip. Bones of the pelvis are otherwise congruent. Vascular calcium noted in the medial thighs. Remaining soft tissues are unremarkable. IMPRESSION: Normal postoperative appearance of  the left total hip arthroplasty without evidence of acute hardware complication. Electronically Signed   By: Lovena Le M.D.   On: 09/14/2019 16:58   Dg Hip Operative Unilat W Or W/o Pelvis Left  Result Date: 09/14/2019 CLINICAL DATA:  Left hip replacement. EXAM: OPERATIVE left HIP (WITH PELVIS IF PERFORMED) 2 VIEWS TECHNIQUE: Fluoroscopic spot image(s) were submitted for interpretation post-operatively. Radiation exposure index: 46 seconds. COMPARISON:  August 30, 2019. FINDINGS: The left acetabular and femoral components appear to be well situated. Expected postoperative changes are seen in the surrounding soft tissues. IMPRESSION: Fluoroscopic guidance provided during left total hip arthroplasty. Electronically Signed  By: Marijo Conception M.D.   On: 09/14/2019 13:56        Scheduled Meds:  amoxicillin  500 mg Oral Q8H   brimonidine  1 drop Left Eye TID   Chlorhexidine Gluconate Cloth  6 each Topical Daily   docusate sodium  100 mg Oral BID   insulin aspart  0-5 Units Subcutaneous QHS   insulin aspart  0-9 Units Subcutaneous TID WC   lidocaine  1 application Urethral Once   rivaroxaban  10 mg Oral Q24H   sodium chloride  1 drop Left Eye TID   sodium chloride  1 application Left Eye QHS   tamsulosin  0.4 mg Oral Daily   timolol  1 drop Left Eye BID   traMADol  50 mg Oral Q6H   Continuous Infusions:   ceFAZolin (ANCEF) IV 2 g (09/15/19 0540)   methocarbamol (ROBAXIN) IV       LOS: 1 day    Time spent: 50min   Domenic Polite, MD [facility]   09/15/2019, 12:31 PM

## 2019-09-16 ENCOUNTER — Telehealth: Payer: Self-pay | Admitting: *Deleted

## 2019-09-16 DIAGNOSIS — I10 Essential (primary) hypertension: Secondary | ICD-10-CM | POA: Diagnosis not present

## 2019-09-16 DIAGNOSIS — Z96649 Presence of unspecified artificial hip joint: Secondary | ICD-10-CM

## 2019-09-16 DIAGNOSIS — R338 Other retention of urine: Secondary | ICD-10-CM | POA: Diagnosis not present

## 2019-09-16 LAB — BASIC METABOLIC PANEL
Anion gap: 10 (ref 5–15)
BUN: 12 mg/dL (ref 8–23)
CO2: 24 mmol/L (ref 22–32)
Calcium: 8.5 mg/dL — ABNORMAL LOW (ref 8.9–10.3)
Chloride: 100 mmol/L (ref 98–111)
Creatinine, Ser: 1.04 mg/dL (ref 0.61–1.24)
GFR calc Af Amer: >60 mL/min (ref >60)
GFR calc non Af Amer: >60 mL/min (ref >60)
Glucose, Bld: 129 mg/dL — ABNORMAL HIGH (ref 70–99)
Potassium: 4 mmol/L (ref 3.5–5.1)
Sodium: 134 mmol/L — ABNORMAL LOW (ref 135–145)

## 2019-09-16 LAB — CBC
HCT: 35.6 % — ABNORMAL LOW (ref 39.0–52.0)
Hemoglobin: 11.7 g/dL — ABNORMAL LOW (ref 13.0–17.0)
MCH: 29.8 pg (ref 26.0–34.0)
MCHC: 32.9 g/dL (ref 30.0–36.0)
MCV: 90.6 fL (ref 80.0–100.0)
Platelets: 221 10*3/uL (ref 150–400)
RBC: 3.93 MIL/uL — ABNORMAL LOW (ref 4.22–5.81)
RDW: 12.9 % (ref 11.5–15.5)
WBC: 10 10*3/uL (ref 4.0–10.5)
nRBC: 0 % (ref 0.0–0.2)

## 2019-09-16 LAB — GLUCOSE, CAPILLARY
Glucose-Capillary: 112 mg/dL — ABNORMAL HIGH (ref 70–99)
Glucose-Capillary: 174 mg/dL — ABNORMAL HIGH (ref 70–99)
Glucose-Capillary: 126 mg/dL — ABNORMAL HIGH (ref 70–99)

## 2019-09-16 MED ORDER — OXYCODONE HCL 5 MG PO TABS: 5.0000 mg | ORAL_TABLET | ORAL | 0 refills | Status: DC | PRN

## 2019-09-16 MED ORDER — METHOCARBAMOL 500 MG PO TABS: 500.0000 mg | ORAL_TABLET | Freq: Three times a day (TID) | ORAL | 0 refills | Status: DC | PRN

## 2019-09-16 MED ORDER — AMOXICILLIN 500 MG PO CAPS: 500.0000 mg | ORAL_CAPSULE | Freq: Three times a day (TID) | ORAL | 0 refills | Status: DC

## 2019-09-16 MED ORDER — RIVAROXABAN 10 MG PO TABS: 10.0000 mg | ORAL_TABLET | ORAL | 0 refills | Status: DC

## 2019-09-16 NOTE — Progress Notes (Signed)
  Subjective: Patient stable.  Desires discharge to home.  Doing well with therapy in terms of walking around.  No stairs yet.   Objective: Vital signs in last 24 hours: Temp:  [98.1 F (36.7 C)-99 F (37.2 C)] 99 F (37.2 C) (10/29 0808) Pulse Rate:  [78-81] 78 (10/29 0808) Resp:  [16-18] 16 (10/29 0808) BP: (99-135)/(60-69) 133/60 (10/29 0808) SpO2:  [96 %-100 %] 98 % (10/29 0808)  Intake/Output from previous day: 10/28 0701 - 10/29 0700 In: 665 [P.O.:240] Out: 1000 [Urine:1000] Intake/Output this shift: No intake/output data recorded.  Exam:  Intact pulses distally Dorsiflexion/Plantar flexion intact Compartment soft  Labs: Recent Labs    09/14/19 1849 09/15/19 0323 09/16/19 0338  HGB 14.5 12.0* 11.7*   Recent Labs    09/15/19 0323 09/16/19 0338  WBC 10.7* 10.0  RBC 3.98* 3.93*  HCT 36.4* 35.6*  PLT 213 221   Recent Labs    09/15/19 0323 09/16/19 0338  NA 134* 134*  K 4.2 4.0  CL 102 100  CO2 24 24  BUN 13 12  CREATININE 1.13 1.04  GLUCOSE 133* 129*  CALCIUM 8.6* 8.5*   No results for input(s): LABPT, INR in the last 72 hours.  Assessment/Plan: Plan at this time is physical therapy this morning.  Discharge to home this afternoon.  We will discharge him on Xarelto for DVT prophylaxis along with oxycodone for pain muscle relaxer as well as oral antibiotics until the Foley is discontinued next week.  He will need to arrange for follow-up to have the Foley discontinued at the urologist office.   G Scott Santoria Chason 09/16/2019, 8:14 AM

## 2019-09-16 NOTE — Progress Notes (Signed)
Notified the office of Dr. Marlou Sa that patient was having darker bloody urine, more confusion, and intermittent abd pain. Awaiting orders.

## 2019-09-16 NOTE — Plan of Care (Signed)
  Problem: Pain Managment: Goal: General experience of comfort will improve Outcome: Progressing   Problem: Skin Integrity: Goal: Risk for impaired skin integrity will decrease Outcome: Progressing   Problem: Nutrition: Goal: Adequate nutrition will be maintained Outcome: Progressing   Problem: Clinical Measurements: Goal: Cardiovascular complication will be avoided Outcome: Progressing   Problem: Clinical Measurements: Goal: Ability to maintain clinical measurements within normal limits will improve Outcome: Progressing

## 2019-09-16 NOTE — Progress Notes (Signed)
Physical Therapy Treatment Patient Details Name: [name] MRN: [id number] DOB: 22-Sep-1941 Today's Date: 09/16/2019    History of Present Illness Pt is a pleasant 78 yo male presenting s/p elective L THR (10/27) due to chronic arthritis that failed conservative measures. 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 Current admission complicated by occurence of heart block in OR and acute urine retention.    PT Comments    Pt was in bed with wife present upon PT arrival, agreeable to PT session. Pt was able to demonstrate slightly improved ability to perform sit-stand transfers, but needed sig more assist to get out of bed and ambulate. The patient also seems to be slightly more disoriented this afternoon, as he remained unaware that he was wearing a hospital gown rather than his own clothes even with multiple corrections. Pt reports he feels safer with mobility and feels ready for home, but Pt requires more assistance, more verbal cues, and tactile cues to complete all mobility and transfers safely. Pt will continue to benefit from skilled PT, and I recommend a short stay in skilled nursing to [facility] therapy before returning home with assist from his wife.     Follow Up Recommendations  SNF;Supervision/Assistance - 24 hour(short SNF stay. If pt refuses SNF, max HH assist)     Equipment Recommendations  Rolling walker with 5" wheels;3in1 (PT)    Recommendations for Other Services       Precautions / Restrictions Precautions Precautions: Anterior Hip;Fall Precaution Booklet Issued: Yes (comment) Restrictions Weight Bearing Restrictions: Yes LLE Weight Bearing: Weight bearing as tolerated    Mobility  Bed Mobility Overal bed mobility: Needs Assistance Bed Mobility: Supine to Sit     Supine to sit: Mod assist;HOB elevated     General bed mobility comments: assist for management of LLE, modA for elevation of trunk even with elevated HOB,  pain limited. Requires multiple VCs and extra time  Transfers Overall transfer level: Needs assistance Equipment used: Rolling walker (2 wheeled) Transfers: Sit to/from Stand Sit to Stand: Min assist;From elevated surface         General transfer comment: pt requires minA to stand from elevated bed and minA to return to sitting in recliner. Pt also benefits from VCs for hand positioning. 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 This is an added barrier as pt predominantly uses hands to WB during functional activities.  Ambulation/Gait Ambulation/Gait assistance: Min assist Gait Distance (Feet): 30 Feet Assistive device: Rolling walker (2 wheeled) Gait Pattern/deviations: Step-to pattern;Decreased step length - left;Decreased step length - right;Decreased stance time - left;Decreased dorsiflexion - right;Decreased dorsiflexion - left Gait velocity: decreased; < 0.1 Gait velocity interpretation: <1.31 ft/sec, indicative of household ambulator General Gait Details: Pt with sig increased WB on BUE due to pain, poor clearance of BLE with absent heel-toe pattern. 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 This is an added barrier as pt predominantly uses hands to WB during functional activities.   Stairs             Wheelchair Mobility    Modified Rankin (Stroke Patients Only)       Balance Overall balance assessment: Needs assistance Sitting-balance support: Feet supported Sitting balance-Leahy Scale: Fair Sitting balance - Comments: pain-limited. Pt uses UE to off-weight surgical leg  Standing balance support: Bilateral upper extremity supported Standing balance-Leahy Scale: Poor                              Cognition Arousal/Alertness: Awake/alert Behavior During Therapy: WFL for tasks assessed/performed Overall Cognitive  Status: Impaired/Different from baseline Area of Impairment: Problem solving;Attention;Following commands                   Current Attention Level: Focused   Following Commands: Follows one step commands inconsistently Safety/Judgement: Decreased awareness of safety;Decreased awareness of deficits   Problem Solving: Slow processing;Decreased initiation;Difficulty sequencing;Requires verbal cues;Requires tactile cues General Comments: Pt  is pleasant and cooperative, but often requires repeated one-step cues to complete tasks. Pt became fixated on "putting his belt through his belt loop" while seated on toilet (trying to push gait belt through gown). Unable to clarify baseline with wife at this time, but pt cognition seems to be deteriorating based on his performance yesterday and this morning.      Exercises Total Joint Exercises Ankle Circles/Pumps: AROM;10 reps;Both Quad Sets: AROM;Both;10 reps(Pt needs physical and verbal cues to complete 10 exercises.)    General Comments        Pertinent Vitals/Pain Pain Score: 9  Pain Location: in R hip with amb, pt still complaining of pain in B hands Pain Descriptors / Indicators: Sore;Grimacing;Shooting;Tingling;Sharp;Stabbing Pain Intervention(s): Limited activity within patient's tolerance;Monitored during session;Repositioned    Home Living                      Prior Function            PT Goals (current goals can now be found in the care plan section) Acute Rehab PT Goals Patient Stated Goal: return home PT Goal Formulation: With patient Time For Goal Achievement: 09/29/19 Potential to Achieve Goals: Good Progress towards PT goals: Not progressing toward goals - comment(although Pt is still able to ambulate and performs some transfers more quickly, the level of assistance and cues needed is increasing with each visit. Will continue to monitor pt progress)    Frequency    7X/week      PT Plan Discharge  plan needs to be updated    Co-evaluation              AM-PAC PT "6 Clicks" Mobility   Outcome Measure  Help needed turning from your back to your side while in a flat bed without using bedrails?: A Little Help needed moving from lying on your back to sitting on the side of a flat bed without using bedrails?: A Little Help needed moving to and from a bed to a chair (including a wheelchair)?: A Little Help needed standing up from a chair using your arms (e.g., wheelchair or bedside chair)?: A Little Help needed to walk in hospital room?: A Little Help needed climbing 3-5 steps with a railing? : A Lot 6 Click Score: 17    End of Session Equipment Utilized During Treatment: Gait belt Activity Tolerance: Patient tolerated treatment well;Patient limited by pain Patient left: in chair;with chair alarm set;with call bell/phone within reach Nurse Communication: Mobility status;Other (comment)(cognition and confusion during visit) PT Visit Diagnosis: Other abnormalities of gait and mobility (R26.89);Unsteadiness on feet (R26.81);Muscle weakness (generalized) (M62.81);Pain Pain - Right/Left: Left Pain - part of body: Hand;Hip     Time: [id number] PT Time Calculation (min) (ACUTE ONLY): 31 min  Charges:  $Gait Training: 8-22  mins $Therapeutic Activity: 8-22 mins                     Mickey Farber, PT, DPT   Acute Rehabilitation Department [phone]   Otho Bellows 09/16/2019, 3:01 PM

## 2019-09-16 NOTE — Progress Notes (Signed)
Physical Therapy Treatment Patient Details Name: [name] MRN: [id number] DOB: 1941-07-26 Today's Date: 09/16/2019    History of Present Illness Pt is a pleasant 78 yo male presenting s/p elective L THR (10/27) due to chronic arthritis that failed conservative measures. 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 Current admission complicated by occurence of heart block in OR and acute urine retention.    PT Comments    Pt was attempting to transfer to sitting EOB with RN upon PT arrival, agreeable to PT session. However, throughout session, Pt demos noticeably worse cognition (possibly due to absence of wife), but requires multiple repetitions of one-step commands and is only able to follow instructions with sig increased time and guidance. Pt is still demonstrating difficulty with bed mobility, and requires minA as well as extra time and elevated HOB to complete full transfer even with assist. 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  Following session, NT found PT to report that the pt had not called for assistance to leave bathroom and was found attempting to ambulate independently in room. This almost resulted in a fall. RN was notified who also reported issues with pt cognition this morning. If cognition and pain in hands cannot be resolved prior to d/c, SNF may be a more safe option.     Follow Up Recommendations  Home health PT;Supervision/Assistance - 24 hour. Pt is not currently safe for home d/c but I believe he can get there with more therapy as well as addressing above issues.      Equipment Recommendations  Rolling walker with 5" wheels;3in1 (PT)    Recommendations for Other Services       Precautions / Restrictions Precautions Precautions: Anterior Hip;Fall Precaution Booklet Issued: Yes  (comment) Restrictions Weight Bearing Restrictions: Yes LLE Weight Bearing: Weight bearing as tolerated    Mobility  Bed Mobility Overal bed mobility: Needs Assistance Bed Mobility: Supine to Sit     Supine to sit: Min assist;HOB elevated     General bed mobility comments: assist for management of LLE, minA for elevation of trunk even with elevated HOB, pain limited. Requires multiple VCs and extra time  Transfers Overall transfer level: Needs assistance Equipment used: Rolling walker (2 wheeled) Transfers: Sit to/from Stand Sit to Stand: Min assist         General transfer comment: pt requires minA to stand from elevated bed and minA to return to sitting in recliner. Pt also benefits from VCs for hand positioning. 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 This is an added barrier as pt predominantly uses hands to WB during functional activities.  Ambulation/Gait Ambulation/Gait assistance: Min assist Gait Distance (Feet): 25 Feet Assistive device: Rolling walker (2 wheeled) Gait Pattern/deviations: Step-to pattern;Decreased step length - left;Decreased step length - right;Decreased stance time - left;Decreased dorsiflexion - right;Decreased dorsiflexion - left Gait velocity: decreased; < 0.1 Gait velocity interpretation: <1.31 ft/sec, indicative of household ambulator General Gait Details: Pt with sig increased WB on BUE due to pain, poor clearance of BLE with absent heel-toe pattern. 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 This is an added barrier as pt predominantly uses hands to WB  during functional activities.   Stairs             Wheelchair Mobility    Modified Rankin (Stroke Patients Only)       Balance Overall balance assessment: Needs assistance Sitting-balance support: Feet supported Sitting balance-Leahy Scale:  Fair Sitting balance - Comments: pain-limited. Pt uses UE to off-weight surgical leg   Standing balance support: Bilateral upper extremity supported Standing balance-Leahy Scale: Poor                              Cognition Arousal/Alertness: Awake/alert Behavior During Therapy: WFL for tasks assessed/performed Overall Cognitive Status: Within Functional Limits for tasks assessed Area of Impairment: Problem solving;Attention;Following commands                   Current Attention Level: Focused   Following Commands: Follows one step commands inconsistently     Problem Solving: Slow processing;Decreased initiation;Difficulty sequencing;Requires verbal cues;Requires tactile cues General Comments: Pt  is pleasant and cooperative, but often requires repeated one-step cues to complete tasks. May be due to distraction of pain.      Exercises      General Comments        Pertinent Vitals/Pain Pain Assessment: Faces Faces Pain Scale: Hurts even more Pain Location: pain increased sig with ambulation and bed mobility, ceased with rest. Ne c/o pain in B hands (neuropathy) that limits WB through B hands. Pain Descriptors / Indicators: Sore;Grimacing;Shooting;Tingling;Sharp;Stabbing Pain Intervention(s): Limited activity within patient's tolerance;Monitored during session;Repositioned;Patient requesting pain meds-RN notified    Home Living                      Prior Function            PT Goals (current goals can now be found in the care plan section) Acute Rehab PT Goals Patient Stated Goal: return home PT Goal Formulation: With patient Potential to Achieve Goals: Good Progress towards PT goals: Progressing toward goals    Frequency    7X/week      PT Plan Current plan remains appropriate    Co-evaluation              AM-PAC PT "6 Clicks" Mobility   Outcome Measure  Help needed turning from your back to your side while in a flat bed  without using bedrails?: A Little Help needed moving from lying on your back to sitting on the side of a flat bed without using bedrails?: A Little Help needed moving to and from a bed to a chair (including a wheelchair)?: A Little Help needed standing up from a chair using your arms (e.g., wheelchair or bedside chair)?: A Little Help needed to walk in hospital room?: A Little Help needed climbing 3-5 steps with a railing? : A Lot 6 Click Score: 17    End of Session Equipment Utilized During Treatment: Gait belt Activity Tolerance: Patient tolerated treatment well;Patient limited by pain(Sig pain in B hands (neuropathy) limited session, RN notified) Patient left: in chair;with chair alarm set;with call bell/phone within reach Nurse Communication: Mobility status;Patient requests pain meds PT Visit Diagnosis: Other abnormalities of gait and mobility (R26.89);Unsteadiness on feet (R26.81);Muscle weakness (generalized) (M62.81);Pain Pain - Right/Left: Left Pain - part of body: Hip;Hand     Time: [id number] PT Time Calculation (min) (ACUTE ONLY): 34 min  Charges:  $Gait Training: 8-22 mins $Therapeutic Activity: 8-22 mins  Mickey Farber, PT, DPT   Acute Rehabilitation Department [phone]    Otho Bellows 09/16/2019, 10:51 AM

## 2019-09-16 NOTE — Care Plan (Signed)
RNCM into patient's hospital room and discussed anticipated d/c home today per Dr. Marlou Sa. Patient verbalized he is extremely ready to go home. Reminded that he will have to make f/u for Urology to have foley removed in office per Urology recommendations. Doing well from hip surgery and reports pain is "OK". Also made aware that someone from [facility] will be in contact after discharge from hospital to make arrangements to begin [place]. FWW and 3in1 to be delivered today by [employer] prior to discharge. Call to patient's wife and reviewed same information. She states she would be in to visit around 11:00 am today. Reminded of f/u with Dr. Marlou Sa on 09/27/19 and how to reach [facility] for any further needs.

## 2019-09-16 NOTE — Progress Notes (Signed)
PROGRESS NOTE    [name]  [id number] DOB: 1941/04/07 DOA: 09/14/2019 PCP: Kathyrn Lass, MD  Brief Narrative: [name] is an 78 y.o. 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   -He underwent left hip total arthroplasty yesterday by Dr. Marlou Sa, later the afternoon he was found to have acute abdominal pain due to urinary retention, TRH and urology were consulted  Assessment & Plan:   Acute urinary retention, UTI, acute abdominal pain -Postop urinary retention in the setting of known urethral stricture -Seen by urology and underwent complex Foley catheter placement with urethral dilation 10/27 PM by Dr. Gloriann Loan -Recommended to keep Foley in for another week, with urology follow-up for voiding trial in 7days -Also with Enterococcus UTI on urine culture-continue amoxicillin for 7 days -Continue Flomax -home with [facility] services today  Enterococcus UTI: -As above   Bradycardia: cardiology following, appreciate recs.  Sounds like this happened in OR in setting of spinal anesthesia and fentanyl.  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  Occurred in 2015 - Xarelto at discharge for prophylaxis planned-agree  Hx T2DM: a1c 6.6.  -Diet controlled   DVT prophylaxis: Xarelto Code Status: Full code Family Communication: No family at bedside Disposition Plan: Home later today   Procedures:   Antimicrobials:    Subjective: -Feels ok, no complaints, waiting to go home Objective: Vitals:   09/15/19 1308 09/15/19 2024 09/16/19 0412 09/16/19 0808  BP: 135/69 128/60 99/66 133/60  Pulse: 78 81 79 78  Resp: 18 18 18 16   Temp: 98.1 F (36.7 C) 98.6 F (37  C) 98.2 F (36.8 C) 99 F (37.2 C)  TempSrc: Oral Oral Oral Oral  SpO2: 99% 100% 96% 98%  Weight:      Height:        Intake/Output Summary (Last 24 hours) at 09/16/2019 1835 Last data filed at 09/16/2019 1200 Gross per 24 hour  Intake 360 ml  Output 250 ml  Net 110 ml   Filed Weights   09/14/19 0925  Weight: 80.6 kg    Examination:  General exam: AAO x2, no distress Respiratory system: Clear  cardiovascular system: S1 & S2 heard, RRR.  Gastrointestinal system: Abdomen is nondistended, soft and nontender.Normal bowel sounds heard., foley with dark urine Central nervous system: Alert and oriented. No focal neurological deficits. Extremities: No edema, right hip with dressing Skin: No rashes, lesions or ulcers Psychiatry: Mood & affect appropriate.     Data Reviewed:   CBC: Recent Labs  Lab 09/14/19 1849 09/15/19 0323 09/16/19 0338  WBC 16.1* 10.7* 10.0  HGB 14.5 12.0* 11.7*  HCT 44.9 36.4* 35.6*  MCV 93.7 91.5 90.6  PLT 254 213 [id number]   Basic Metabolic Panel: Recent Labs  Lab 09/14/19 1849 09/15/19 0323 09/16/19 0338  NA 134* 134* 134*  K 3.8 4.2 4.0  CL 102 102 100  CO2 20* 24 24  GLUCOSE 169* 133* 129*  BUN 10 13 12   CREATININE 1.20 1.13 1.04  CALCIUM 9.0 8.6* 8.5*  MG  --  1.7  --    GFR: Estimated Creatinine Clearance: 60.5 mL/min (by C-G formula based on SCr of 1.04  mg/dL). Liver Function Tests: Recent Labs  Lab 09/14/19 1849 09/15/19 0323  AST 25 21  ALT 16 14  ALKPHOS 42 34*  BILITOT 1.0 0.9  PROT 6.5 5.5*  ALBUMIN 3.6 3.0*   Recent Labs  Lab 09/14/19 1849  LIPASE 23   No results for input(s): AMMONIA in the last 168 hours. Coagulation Profile: No results for input(s): INR, PROTIME in the last 168 hours. Cardiac Enzymes: No results for input(s): CKTOTAL, CKMB, CKMBINDEX, TROPONINI in the last 168 hours. BNP (last 3 results) No results for input(s): PROBNP in the last 8760 hours. HbA1C: No results for input(s): HGBA1C in  the last 72 hours. CBG: Recent Labs  Lab 09/15/19 1207 09/15/19 1622 09/15/19 2134 09/16/19 0649 09/16/19 1637  GLUCAP 110* 131* 122* 112* 174*   Lipid Profile: Recent Labs    09/15/19 0323  CHOL 166  HDL 78  LDLCALC 81  TRIG 33  CHOLHDL 2.1   Thyroid Function Tests: No results for input(s): TSH, T4TOTAL, FREET4, T3FREE, THYROIDAB in the last 72 hours. Anemia Panel: No results for input(s): VITAMINB12, FOLATE, FERRITIN, TIBC, IRON, RETICCTPCT in the last 72 hours. Urine analysis:    Component Value Date/Time   COLORURINE YELLOW 09/14/2019 2335   APPEARANCEUR CLEAR 09/14/2019 2335   LABSPEC 1.009 09/14/2019 2335   PHURINE 6.0 09/14/2019 2335   GLUCOSEU NEGATIVE 09/14/2019 2335   HGBUR LARGE (A) 09/14/2019 2335   BILIRUBINUR NEGATIVE 09/14/2019 2335   KETONESUR NEGATIVE 09/14/2019 2335   PROTEINUR NEGATIVE 09/14/2019 2335   UROBILINOGEN 0.2 04/29/2014 1111   NITRITE NEGATIVE 09/14/2019 2335   LEUKOCYTESUR NEGATIVE 09/14/2019 2335   Sepsis Labs: @LABRCNTIP (procalcitonin:4,lacticidven:4)  ) Recent Results (from the past 240 hour(s))  Urine culture     Status: Abnormal   Collection Time: 09/07/19 10:09 AM   Specimen: Urine, Clean Catch  Result Value Ref Range Status   Specimen Description URINE, CLEAN CATCH  Final   Special Requests   Final    NONE Performed at [facility], [place] [address] [place], [place] [address]    Culture MULTIPLE SPECIES PRESENT, SUGGEST RECOLLECTION (A)  Final   Report Status 09/09/2019 FINAL  Final  Surgical pcr screen     Status: None   Collection Time: 09/07/19 11:08 AM   Specimen: Nasal Mucosa; Nasal Swab  Result Value Ref Range Status   MRSA, PCR NEGATIVE NEGATIVE Final   Staphylococcus aureus NEGATIVE NEGATIVE Final    Comment: (NOTE) The Xpert SA Assay (FDA approved for NASAL specimens in patients 88 years of age and older), is one component of a comprehensive surveillance program. It is not intended to diagnose  infection nor to guide or monitor treatment. Performed at [facility], [place] [address], [place], [place] [address]   Urine Culture     Status: Abnormal   Collection Time: 09/09/19  3:12 PM   Specimen: Urine  Result Value Ref Range Status   MICRO NUMBER: [id number]  Final   SPECIMEN QUALITY: Adequate  Final   Sample Source NOT GIVEN  Final   STATUS: FINAL  Final   ISOLATE 1: Enterococcus faecium (A)  Final    Comment: Greater than 100,000 CFU/mL of Enterococcus faecium      Susceptibility   Enterococcus faecium - URINE CULTURE POSITIVE 1    AMPICILLIN 8 Sensitive     VANCOMYCIN <=0.5 Sensitive     NITROFURANTOIN* 128 Resistant      * Legend:S = Susceptible  I = IntermediateR = Resistant  NS = Not susceptible* = Not tested  NR = Not reported**NN = See antimicrobic comments  Novel Coronavirus, NAA (Hosp order, Send-out to Ref Lab; TAT 18-24 hrs     Status: None   Collection Time: 09/10/19 10:32 AM   Specimen: Nasopharyngeal Swab; Respiratory  Result Value Ref Range Status   SARS-CoV-2, NAA NOT DETECTED NOT DETECTED Final    Comment: (NOTE) This nucleic acid amplification test was developed and its performance characteristics determined by [employer]. Nucleic acid amplification tests include PCR and TMA. This test has not been FDA cleared or approved. This test has been authorized by FDA under an Emergency Use Authorization (EUA). This test is only authorized for the duration of time the declaration that circumstances exist justifying the authorization of the emergency use of in vitro diagnostic tests for detection of SARS-CoV-2 virus and/or diagnosis of COVID-19 infection under section 564(b)(1) of the Act, 21 U.S.C. [id number]) (1), unless the authorization is terminated or revoked sooner. When diagnostic testing is negative, the possibility of a false negative result should be considered in the context of a patient's recent exposures and the presence of clinical  signs and symptoms consistent with COVID-19. An individual without symptoms of COVID- 19 and who is not shedding SARS-CoV-2 vi rus would expect to have a negative (not detected) result in this assay. Performed At: [facility] [address] [place], [place] [id number] Rush Farmer MD [id number]    [place]  Final    Comment: Performed at [facility], [place] [address], [place], [place] [address]  Culture, Urine     Status: None   Collection Time: 09/14/19 11:35 PM   Specimen: Urine, Catheterized  Result Value Ref Range Status   Specimen Description URINE, CATHETERIZED  Final   Special Requests NONE  Final   Culture   Final    NO GROWTH Performed at [facility], [address] [place], [place] [address]    Report Status 09/15/2019 FINAL  Final         Radiology Studies: No results found.      Scheduled Meds: . amoxicillin  500 mg Oral Q8H  . brimonidine  1 drop Left Eye TID  . Chlorhexidine Gluconate Cloth  6 each Topical Daily  . docusate sodium  100 mg Oral BID  . insulin aspart  0-5 Units Subcutaneous QHS  . insulin aspart  0-9 Units Subcutaneous TID WC  . lidocaine  1 application Urethral Once  . rivaroxaban  10 mg Oral Q24H  . sodium chloride  1 drop Left Eye TID  . sodium chloride  1 application Left Eye QHS  . tamsulosin  0.4 mg Oral Daily  . timolol  1 drop Left Eye BID  . traMADol  50 mg Oral Q6H   Continuous Infusions: . methocarbamol (ROBAXIN) IV       LOS: 2 days    Time spent: 52min   Domenic Polite, MD [facility]   09/16/2019, 6:35 PM

## 2019-09-16 NOTE — Plan of Care (Signed)

## 2019-09-16 NOTE — Telephone Encounter (Signed)
RNCM Ortho bundle visit and update.

## 2019-09-17 DIAGNOSIS — I10 Essential (primary) hypertension: Secondary | ICD-10-CM | POA: Diagnosis not present

## 2019-09-17 DIAGNOSIS — R338 Other retention of urine: Secondary | ICD-10-CM | POA: Diagnosis not present

## 2019-09-17 DIAGNOSIS — Z96649 Presence of unspecified artificial hip joint: Secondary | ICD-10-CM | POA: Diagnosis not present

## 2019-09-17 LAB — BASIC METABOLIC PANEL
Anion gap: 9 (ref 5–15)
BUN: 12 mg/dL (ref 8–23)
CO2: 25 mmol/L (ref 22–32)
Calcium: 8.4 mg/dL — ABNORMAL LOW (ref 8.9–10.3)
Chloride: 101 mmol/L (ref 98–111)
Creatinine, Ser: 1.01 mg/dL (ref 0.61–1.24)
GFR calc Af Amer: >60 mL/min (ref >60)
GFR calc non Af Amer: >60 mL/min (ref >60)
Glucose, Bld: 126 mg/dL — ABNORMAL HIGH (ref 70–99)
Potassium: 3.8 mmol/L (ref 3.5–5.1)
Sodium: 135 mmol/L (ref 135–145)

## 2019-09-17 LAB — CBC
HCT: 32.3 % — ABNORMAL LOW (ref 39.0–52.0)
Hemoglobin: 10.8 g/dL — ABNORMAL LOW (ref 13.0–17.0)
MCH: 30 pg (ref 26.0–34.0)
MCHC: 33.4 g/dL (ref 30.0–36.0)
MCV: 89.7 fL (ref 80.0–100.0)
Platelets: 223 10*3/uL (ref 150–400)
RBC: 3.6 MIL/uL — ABNORMAL LOW (ref 4.22–5.81)
RDW: 12.8 % (ref 11.5–15.5)
WBC: 8.3 10*3/uL (ref 4.0–10.5)
nRBC: 0 % (ref 0.0–0.2)

## 2019-09-17 LAB — GLUCOSE, CAPILLARY
Glucose-Capillary: 125 mg/dL — ABNORMAL HIGH (ref 70–99)
Glucose-Capillary: 194 mg/dL — ABNORMAL HIGH (ref 70–99)

## 2019-09-17 NOTE — Progress Notes (Signed)
Physical Therapy Treatment Patient Details Name: ROCHELLE WAJDA MRN: [id number] DOB: 1941-05-23 Today's Date: 09/17/2019    History of Present Illness Pt is a pleasant 78 yo male presenting s/p elective L THR (10/27) due to chronic arthritis that failed conservative measures. 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 Current admission complicated by occurence of heart block in OR and acute urine retention.    PT Comments    Pt was OOB in recliner upon PT arrival with wife present. Due to family desire to go home, Pt was taken to practice stairs in PT gym with family assistance. The patient was able to navigate 2 stairs without use of handrails with use of RW x3, but requires modA and frequent VCs to complete safely. Wife was able to lead and guard patient successfully, and reports she will train at least two other individuals on how to assist the patient up the stairs. The pt reports he is confident he can navigate the stairs at home and does not report any increase in pain or sig fatigue with this activity. At this point the pt has demonstrated improved tolerance for mobility, and improved cognition, so he does appear to be more safe for d/c home with max HHPT services and 24/7 supervision.     Follow Up Recommendations  Home health PT;Supervision/Assistance - 24 hour(family is adamant that Pt can be safe at home, recommend MAX HHPT services). Wife was educated on HEP for continued progression as well as technique for stair navigation, and how to cue and assist pt during sit-stand transfers. Pt wife was able to demonstrate understanding. Furthermore, I am concerned about possible return/progression of confusion if pt were to be isolated at SNF, which would further impact his safety. Due to this concern, I feel home with max HHPT and careful supervision of wife is best for this patient at this time.      Equipment Recommendations  Rolling walker with 5"  wheels;3in1 (PT)    Recommendations for Other Services       Precautions / Restrictions Precautions Precautions: Anterior Hip;Fall Precaution Booklet Issued: Yes (comment) Restrictions Weight Bearing Restrictions: Yes LLE Weight Bearing: Weight bearing as tolerated    Mobility  Bed Mobility               General bed mobility comments: pt OOB in recliner upon arrival  Transfers Overall transfer level: Needs assistance Equipment used: Rolling walker (2 wheeled) Transfers: Sit to/from Stand Sit to Stand: Min assist;Mod assist;From elevated surface         General transfer comment: pt requires minA to stand from [place] to return to sitting in recliner. Pt also benefits from VCs for hand positioning. Pt with sig post lean upon standing requyiring modA, needs VCs and minA with RW to maintain upright.  Ambulation/Gait Ambulation/Gait assistance: Min guard Gait Distance (Feet): 10 Feet Assistive device: Rolling walker (2 wheeled) Gait Pattern/deviations: Step-to pattern;Decreased step length - left;Decreased step length - right;Decreased stance time - left;Decreased dorsiflexion - right;Decreased dorsiflexion - left Gait velocity: decreased; < 0.1 Gait velocity interpretation: <1.31 ft/sec, indicative of household ambulator General Gait Details: Pt with sig increased WB on BUE due to pain, poor clearance of BLE with absent heel-toe pattern. 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 This is an added barrier as pt predominantly uses hands to WB during functional activities.  Stairs Stairs: Yes Stairs assistance: Mod assist;+2 physical assistance;+2 safety/equipment Stair Management: No rails;With walker Number of Stairs: 2 General stair comments: Pt practiced 2 stairs without use of rails but with use of RW x 3 in order to practice for home. Pt was able to complete stairs with sig extra time, modA,  and repeated VCs for sequencing. Pt is most stable going up backwards and down forward with assist of at least 2 people.   Wheelchair Mobility    Modified Rankin (Stroke Patients Only)       Balance Overall balance assessment: Needs assistance Sitting-balance support: Feet supported Sitting balance-Leahy Scale: Fair     Standing balance support: Bilateral upper extremity supported;During functional activity Standing balance-Leahy Scale: Poor Standing balance comment: sig post lean intially, then able to maintain with min guard and RW                            Cognition Arousal/Alertness: Awake/alert Behavior During Therapy: WFL for tasks assessed/performed Overall Cognitive Status: Difficult to assess Area of Impairment: Problem solving;Attention;Following commands                   Current Attention Level: Sustained   Following Commands: Follows one step commands with increased time;Follows one step commands inconsistently Safety/Judgement: Decreased awareness of safety;Decreased awareness of deficits   Problem Solving: Slow processing;Decreased initiation;Difficulty sequencing;Requires verbal cues;Requires tactile cues General Comments: Pt  is pleasant and cooperative, but often requires repeated one-step cues to complete tasks.Improved command following and orientation this afternoon, STM still appears limited as pt has difficulty with sequencing of stair navigation despite repeated instructions      Exercises Total Joint Exercises Long Arc Quad: AROM;Both;10 reps Other Exercises Other Exercises: Seated marches, Both, 2 x 10 each Other Exercises: Seated calf raises, BLE, 2 x 10    General Comments        Pertinent Vitals/Pain Pain Assessment: Faces Faces Pain Scale: Hurts little more Pain Location: in R hip with amb, pt still complaining of 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 Pain Descriptors / Indicators:  Sore;Grimacing;Shooting;Tingling;Sharp;Stabbing Pain Intervention(s): Limited activity within patient's tolerance;Monitored during session;Repositioned    Home Living                      Prior Function            PT Goals (current goals can now be found in the care plan section) Acute Rehab PT Goals Patient Stated Goal: return home PT Goal Formulation: With patient/family Time For Goal Achievement: 09/29/19 Potential to Achieve Goals: Good Progress towards PT goals: Progressing toward goals    Frequency    7X/week      PT Plan Discharge plan needs to be updated    Co-evaluation              AM-PAC PT "6 Clicks" Mobility   Outcome Measure  Help needed turning from your back to your side while in a flat bed without using bedrails?: A Little Help needed moving from lying on your back to sitting on the side of a flat bed without using bedrails?: A Little Help needed moving to and from a bed to a chair (including a wheelchair)?: A Little Help needed standing up from a chair using your arms (e.g., wheelchair or bedside chair)?: A Little Help needed to walk in hospital room?: A Little Help needed climbing 3-5 steps with a railing? : A Lot  6 Click Score: 17    End of Session Equipment Utilized During Treatment: Gait belt Activity Tolerance: Patient tolerated treatment well;Patient limited by fatigue Patient left: in chair;with chair alarm set;with call bell/phone within reach;with family/visitor present Nurse Communication: Mobility status;Other (comment)(d/c plan, cog) PT Visit Diagnosis: Other abnormalities of gait and mobility (R26.89);Unsteadiness on feet (R26.81);Muscle weakness (generalized) (M62.81);Pain Pain - Right/Left: Left Pain - part of body: Hand;Hip     Time: [id number] PT Time Calculation (min) (ACUTE ONLY): 58 min  Charges:  $Gait Training: 23-37 mins $Therapeutic Exercise: 8-22 mins $Self Care/Home Management: 8-22                      Mickey Farber, PT, DPT   Acute Rehabilitation Department [phone]   Otho Bellows 09/17/2019, 3:13 PM

## 2019-09-17 NOTE — Plan of Care (Signed)
  Problem: Clinical Measurements: Goal: Ability to maintain clinical measurements within normal limits will improve Outcome: Progressing Goal: Will remain free from infection Outcome: Progressing Goal: Diagnostic test results will improve Outcome: Progressing Goal: Respiratory complications will improve Outcome: Progressing Goal: Cardiovascular complication will be avoided Outcome: Progressing   Problem: Activity: Goal: Risk for activity intolerance will decrease Outcome: Progressing   Problem: Nutrition: Goal: Adequate nutrition will be maintained Outcome: Progressing   Problem: Elimination: Goal: Will not experience complications related to bowel motility Outcome: Progressing Goal: Will not experience complications related to urinary retention Outcome: Progressing   Problem: Elimination: Goal: Will not experience complications related to urinary retention Outcome: Progressing   Problem: Pain Managment: Goal: General experience of comfort will improve Outcome: Progressing   Problem: Safety: Goal: Ability to remain free from injury will improve Outcome: Progressing   Problem: Skin Integrity: Goal: Risk for impaired skin integrity will decrease Outcome: Progressing

## 2019-09-17 NOTE — Care Management Important Message (Signed)
Important Message  Patient Details  Name: [name] MRN: [id number] Date of Birth: 1941-03-11   Medicare Important Message Given:  Yes     Memory Argue 09/17/2019, 1:19 PM

## 2019-09-17 NOTE — Progress Notes (Signed)
[name] to be D/C'd Home per MD order.  Discussed prescriptions and follow up appointments with the patient. Prescriptions given to patient, medication list explained in detail. Pt verbalized understanding.  Allergies as of 09/17/2019   No Known Allergies     Medication List    STOP taking these medications   ampicillin 250 MG capsule Commonly known as: PRINCIPEN   aspirin EC 81 MG tablet   baclofen 10 MG tablet Commonly known as: LIORESAL   cefTRIAXone 2 g in dextrose 5 % 50 mL   HYDROcodone-acetaminophen 10-325 MG tablet Commonly known as: Norco   menthol-cetylpyridinium 3 MG lozenge Commonly known as: CEPACOL     TAKE these medications   amoxicillin 500 MG capsule Commonly known as: AMOXIL Take 1 capsule (500 mg total) by mouth every 8 (eight) hours.   brimonidine 0.2 % ophthalmic solution Commonly known as: ALPHAGAN Place 1 drop into the left eye 3 (three) times daily.   lisinopril 20 MG tablet Commonly known as: ZESTRIL Take 20 mg by mouth daily.   methocarbamol 500 MG tablet Commonly known as: ROBAXIN Take 1 tablet (500 mg total) by mouth every 8 (eight) hours as needed for muscle spasms.   naproxen sodium 220 MG tablet Commonly known as: ALEVE Take 440 mg by mouth daily as needed (pain).   oxyCODONE 5 MG immediate release tablet Commonly known as: Oxy IR/ROXICODONE Take 1 tablet (5 mg total) by mouth every 4 (four) hours as needed for moderate pain (pain score 4-6).   rivaroxaban 10 MG Tabs tablet Commonly known as: XARELTO Take 1 tablet (10 mg total) by mouth daily. What changed:   medication strength  how much to take  when to take this  additional instructions   simvastatin 20 MG tablet Commonly known as: ZOCOR Take 20 mg by mouth daily.   sodium chloride 2 % ophthalmic solution Commonly known as: MURO 128 Place 1 drop into the left eye 3 (three) times daily.   Muro 128 5 % ophthalmic ointment Generic drug: sodium  chloride Place 1 application into the left eye at bedtime.   timolol 0.5 % ophthalmic solution Commonly known as: TIMOPTIC Place 1 drop into the left eye 2 (two) times daily.       Vitals:   09/17/19 0834 09/17/19 1347  BP: 140/77 114/90  Pulse: 95 88  Resp: 18 18  Temp: 98.4 F (36.9 C) 98.2 F (36.8 C)  SpO2: 97% 100%    Skin clean, dry and intact without evidence of skin break down, no evidence of skin tears noted. Dressing clean, dry, and intact. Patient educated on coude catheter care for home. Patient and wife educated on urology follow up. IV catheter discontinued intact. Site without signs and symptoms of complications. Dressing and pressure applied. Pt denies pain at this time. No complaints noted.  An After Visit Summary was printed and given to the patient. Patient escorted via [place], and D/C home via private auto.  [place] RN

## 2019-09-17 NOTE — Progress Notes (Signed)
Patient did better with therapy today.  Plan for discharge today.  Home health will see him tomorrow.

## 2019-09-17 NOTE — Care Plan (Signed)
CM met with [name] and his wife in the hospital room today. Discussed plan and options for home vs. SNF due to changes in PT's recommendations and concerns by all staff regarding slight confusion and multiple cues for physical mobility. Patient's wife feels she will be able to handle physical needs at home and doesn't want him to go to a SNF. She is in good health and will be able to provide assistance along with other family members and a neighbor. Discussed with bedside nurse, treating PT, and Dr. Marlou Sa. Observed physical therapy session today and wife was able to participate. Many verbal cues and reminders provided. After long therapy session wife feels she wants to take patient home. He is in agreement. Therapist did recommend home with home PT after wife verified he will have 24 hour supervision and assistance. [facility] lia[facility] contacted and reviewed all concerns. Someone will be evaluating tomorrow in their home. Gait belt given by bedside RN. MD ok with discharge home today.

## 2019-09-17 NOTE — Progress Notes (Signed)
Patient and wife given gait belt and educated on use

## 2019-09-17 NOTE — Progress Notes (Signed)
PROGRESS NOTE    [name]  [id number] DOB: [birth year] DOA: 09/14/2019 PCP: Kathyrn Lass, MD  Brief Narrative: [name] is an 78 y.o. 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   -He underwent left hip total arthroplasty 10/27 by Dr. Marlou Sa, later the afternoon he was found to have acute abdominal pain,  urinary retention, TRH and urology were consulted  Assessment & Plan:   Acute urinary retention, UTI, acute abdominal pain -Postop urinary retention in the setting of known urethral stricture -Seen by urology and underwent complex Foley catheter placement with urethral dilation 10/27 PM by Dr. Gloriann Loan -Recommended to keep Foley in for another week, with urology follow-up for voiding trial in another 5 days(patient sees Dr. Junious Silk, he is advised to follow-up with him next week) -Also with Enterococcus UTI on urine culture-continue amoxicillin for 7 days -Continue Flomax -His urine is dark as expected after complex care Foley catheter placement, urethral dilation and ongoing anticoagulation with Xarelto, no clots, anticipate slow improvement in hematuria. -Discharge planning, PT noted some confusion yesterday evening during evaluation, at this time patient is awake alert oriented to self place and partly to time, was able to answer most of my questions appropriately, his vital signs are all stable and labs are unremarkable as well  Enterococcus UTI: -As above   Bradycardia: cardiology following, appreciate recs.  Sounds like this happened in OR in setting of spinal anesthesia and fentanyl.  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  Occurred in 2015 -  Xarelto at discharge for prophylaxis planned-agree  Hx T2DM: a1c 6.6.  -Diet controlled   DVT prophylaxis: Xarelto Code Status: Full code Family Communication: No family at bedside Disposition Plan: Per Ortho   Procedures:   Antimicrobials:    Subjective: -Feels well, no events overnight Pleasant sitting up in recliner, just ate his breakfast, oriented x3, tells me that he did have a brief episode of confusion yesterday evening while working with physical therapy,  Objective: Vitals:   09/16/19 0808 09/16/19 1947 09/17/19 0341 09/17/19 0834  BP: 133/60 129/61 111/74 140/77  Pulse: 78 90 91 95  Resp: 16 18 18 18   Temp: 99 F (37.2 C) 98.6 F (37 C) 100 F (37.8 C) 98.4 F (36.9 C)  TempSrc: Oral Oral Oral Oral  SpO2: 98% 94% 99% 97%  Weight:      Height:        Intake/Output Summary (Last 24 hours) at 09/17/2019 1229 Last data filed at 09/17/2019 1011 Gross per 24 hour  Intake 240 ml  Output 1050 ml  Net -810 ml   Filed Weights   09/14/19 0925  Weight: 80.6 kg    Examination:  General exam: AAO x3, no distress Respiratory system: Clear cardiovascular system: S1 & S2 heard, RRR.  Gastrointestinal system: Soft, nontender, nondistended, bowel sounds present, Foley catheter with dark urine Central nervous system: Alert and oriented. No focal neurological deficits. Extremities: No edema, right hip with dressing Skin: No rashes, lesions or ulcers Psychiatry: Mood & affect appropriate.     Data Reviewed:   CBC: Recent Labs  Lab 09/14/19 1849 09/15/19 [id number] 09/16/19 [id number] 09/17/19 0440  WBC 16.1* 10.7* 10.0 8.3  HGB 14.5 12.0* 11.7* 10.8*  HCT 44.9 36.4* 35.6* 32.3*  MCV 93.7 91.5 90.6 89.7  PLT 254 213 221 [id number]   Basic Metabolic Panel: Recent Labs  Lab 09/14/19 1849 09/15/19 0323 09/16/19 0338 09/17/19 0440  NA 134* 134* 134* 135  K 3.8 4.2 4.0 3.8  CL 102 102 100 101  CO2 20* 24 24 25   GLUCOSE 169* 133* 129* 126*  BUN 10 13 12 12    CREATININE 1.20 1.13 1.04 1.01  CALCIUM 9.0 8.6* 8.5* 8.4*  MG  --  1.7  --   --    GFR: Estimated Creatinine Clearance: 62.3 mL/min (by C-G formula based on SCr of 1.01 mg/dL). Liver Function Tests: Recent Labs  Lab 09/14/19 1849 09/15/19 0323  AST 25 21  ALT 16 14  ALKPHOS 42 34*  BILITOT 1.0 0.9  PROT 6.5 5.5*  ALBUMIN 3.6 3.0*   Recent Labs  Lab 09/14/19 1849  LIPASE 23   No results for input(s): AMMONIA in the last 168 hours. Coagulation Profile: No results for input(s): INR, PROTIME in the last 168 hours. Cardiac Enzymes: No results for input(s): CKTOTAL, CKMB, CKMBINDEX, TROPONINI in the last 168 hours. BNP (last 3 results) No results for input(s): PROBNP in the last 8760 hours. HbA1C: No results for input(s): HGBA1C in the last 72 hours. CBG: Recent Labs  Lab 09/16/19 0649 09/16/19 1637 09/16/19 2120 09/17/19 0644 09/17/19 1138  GLUCAP 112* 174* 126* 125* 194*   Lipid Profile: Recent Labs    09/15/19 0323  CHOL 166  HDL 78  LDLCALC 81  TRIG 33  CHOLHDL 2.1   Thyroid Function Tests: No results for input(s): TSH, T4TOTAL, FREET4, T3FREE, THYROIDAB in the last 72 hours. Anemia Panel: No results for input(s): VITAMINB12, FOLATE, FERRITIN, TIBC, IRON, RETICCTPCT in the last 72 hours. Urine analysis:    Component Value Date/Time   COLORURINE YELLOW 09/14/2019 2335   APPEARANCEUR CLEAR 09/14/2019 2335   LABSPEC 1.009 09/14/2019 2335   PHURINE 6.0 09/14/2019 2335   GLUCOSEU NEGATIVE 09/14/2019 2335   HGBUR LARGE (A) 09/14/2019 2335   BILIRUBINUR NEGATIVE 09/14/2019 2335   KETONESUR NEGATIVE 09/14/2019 2335   PROTEINUR NEGATIVE 09/14/2019 2335   UROBILINOGEN 0.2 04/29/2014 1111   NITRITE NEGATIVE 09/14/2019 2335   LEUKOCYTESUR NEGATIVE 09/14/2019 2335   Sepsis Labs: @LABRCNTIP (procalcitonin:4,lacticidven:4)  ) Recent Results (from the past 240 hour(s))  Urine Culture     Status: Abnormal   Collection Time: 09/09/19  3:12 PM   Specimen:  Urine  Result Value Ref Range Status   MICRO NUMBER: [id number]  Final   SPECIMEN QUALITY: Adequate  Final   Sample Source NOT GIVEN  Final   STATUS: FINAL  Final   ISOLATE 1: Enterococcus faecium (A)  Final    Comment: Greater than 100,000 CFU/mL of Enterococcus faecium      Susceptibility   Enterococcus faecium - URINE CULTURE POSITIVE 1    AMPICILLIN 8 Sensitive     VANCOMYCIN <=0.5 Sensitive     NITROFURANTOIN* 128 Resistant      * Legend:S = Susceptible  I = IntermediateR = Resistant  NS = Not susceptible* = Not tested  NR = Not reported**NN = See antimicrobic comments  Novel Coronavirus, NAA (Hosp order, Send-out to Ref Lab; TAT 18-24 hrs     Status: None   Collection Time: 09/10/19 10:32 AM   Specimen: Nasopharyngeal Swab; Respiratory  Result Value Ref Range Status   SARS-CoV-2, NAA NOT  DETECTED NOT DETECTED Final    Comment: (NOTE) This nucleic acid amplification test was developed and its performance characteristics determined by [employer]. Nucleic acid amplification tests include PCR and TMA. This test has not been FDA cleared or approved. This test has been authorized by FDA under an Emergency Use Authorization (EUA). This test is only authorized for the duration of time the declaration that circumstances exist justifying the authorization of the emergency use of in vitro diagnostic tests for detection of SARS-CoV-2 virus and/or diagnosis of COVID-19 infection under section 564(b)(1) of the Act, 21 U.S.C. [id number]) (1), unless the authorization is terminated or revoked sooner. When diagnostic testing is negative, the possibility of a false negative result should be considered in the context of a patient's recent exposures and the presence of clinical signs and symptoms consistent with COVID-19. An individual without symptoms of COVID- 19 and who is not shedding SARS-CoV-2 vi rus would expect to have a negative (not detected) result in this assay. Performed  At: [facility] [address] [place], [place] [id number] Rush Farmer MD [id number]    [place]  Final    Comment: Performed at [facility], [place] [address] [place], [place] [address]  Culture, Urine     Status: None   Collection Time: 09/14/19 11:35 PM   Specimen: Urine, Catheterized  Result Value Ref Range Status   Specimen Description URINE, CATHETERIZED  Final   Special Requests NONE  Final   Culture   Final    NO GROWTH Performed at [facility], [address], [place], [place] [address]    Report Status 09/15/2019 FINAL  Final         Radiology Studies: No results found.      Scheduled Meds: . amoxicillin  500 mg Oral Q8H  . brimonidine  1 drop Left Eye TID  . Chlorhexidine Gluconate Cloth  6 each Topical Daily  . docusate sodium  100 mg Oral BID  . insulin aspart  0-5 Units Subcutaneous QHS  . insulin aspart  0-9 Units Subcutaneous TID WC  . lidocaine  1 application Urethral Once  . rivaroxaban  10 mg Oral Q24H  . sodium chloride  1 drop Left Eye TID  . sodium chloride  1 application Left Eye QHS  . tamsulosin  0.4 mg Oral Daily  . timolol  1 drop Left Eye BID  . traMADol  50 mg Oral Q6H   Continuous Infusions: . methocarbamol (ROBAXIN) IV       LOS: 3 days    Time spent: 75min   Domenic Polite, MD [facility]   09/17/2019, 12:29 PM

## 2019-09-17 NOTE — Progress Notes (Signed)
Physical Therapy Treatment Patient Details Name: [name] MRN: [id number] DOB: [birth year] Today's Date: 09/17/2019    History of Present Illness Pt is a pleasant 78 yo male presenting s/p elective L THR (10/27) due to chronic arthritis that failed conservative measures. 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 Current admission complicated by occurence of heart block in OR and acute urine retention.    PT Comments    Pt in bed but willing to participate in PT upon PT arrival. Appears more oriented to situation today, but did not remember PT visits from yesterday. Pt was able to demo improvements in bed mobility, requiring only minA, VCs, and extra time, but with a flat bed. 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 Despite improvements, I still recommend a short stint at skilled nursing to maximize rehab potential, independence, and safety before returning home.    Follow Up Recommendations  SNF;Supervision/Assistance - 24 hour     Equipment Recommendations  Rolling walker with 5" wheels;3in1 (PT)    Recommendations for Other Services       Precautions / Restrictions Precautions Precautions: Anterior Hip;Fall Precaution Booklet Issued: Yes (comment) Restrictions Weight Bearing Restrictions: Yes LLE Weight Bearing: Weight bearing as tolerated    Mobility  Bed Mobility Overal bed mobility: Needs Assistance Bed Mobility: Supine to Sit     Supine to sit: Min assist     General bed mobility comments: assist for management of LLE, minA for elevation of trunk even, pain limited. Requires multiple VCs and extra time  Transfers Overall transfer level: Needs assistance Equipment used: Rolling walker (2  wheeled)   Sit to Stand: Min assist;From elevated surface         General transfer comment: pt requires minA to stand from elevated bed and minA to return to sitting in recliner. Pt also benefits from VCs for hand positioning. Pt with sig post lean upon standing, needs VCs and minA with RW to maintain upright.  Ambulation/Gait Ambulation/Gait assistance: Min guard Gait Distance (Feet): 40 Feet Assistive device: Rolling walker (2 wheeled) Gait Pattern/deviations: Step-to pattern;Decreased step length - left;Decreased step length - right;Decreased stance time - left;Decreased dorsiflexion - right;Decreased dorsiflexion - left Gait velocity: decreased; < 0.1   General Gait Details: Pt with sig increased WB on BUE due to pain, poor clearance of BLE with absent heel-toe pattern. 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 This is an added barrier as pt predominantly uses hands to WB during functional activities.   Stairs Stairs: (Pt has 3-4 STE but is not yet safe to practice)           Wheelchair Mobility    Modified Rankin (Stroke Patients Only)       Balance Overall balance assessment: Needs assistance Sitting-balance support: Feet supported Sitting balance-Leahy Scale: Fair Sitting balance - Comments: Pt needs minA to get to sitting EOB, but is able to maintain static sitting once there   Standing balance support: Bilateral upper extremity supported;During functional activity Standing balance-Leahy Scale: Poor Standing balance comment: sig post lean intially, then able to maintain with min guard and RW  Cognition Arousal/Alertness: Awake/alert Behavior During Therapy: WFL for tasks assessed/performed Overall Cognitive Status: Impaired/Different from baseline Area of Impairment: Problem solving;Attention;Following commands                   Current Attention  Level: Focused   Following Commands: Follows one step commands with increased time Safety/Judgement: Decreased awareness of safety;Decreased awareness of deficits   Problem Solving: Slow processing;Decreased initiation;Difficulty sequencing;Requires verbal cues;Requires tactile cues General Comments: Pt  is pleasant and cooperative, but often requires repeated one-step cues to complete tasks.Improved command following and orientation this morning, STM still appears limited as pt cannot recall exercises performed during session after ~5 min.      Exercises Total Joint Exercises Long Arc Quad: AROM;Both;10 reps(2 x 10) Other Exercises Other Exercises: Seated marches, Both, 2 x 10 each Other Exercises: Seated calf raises, BLE, 2 x 10    General Comments        Pertinent Vitals/Pain Pain Assessment: Faces Faces Pain Scale: Hurts little more Pain Location: in R hip with amb, pt still complaining of 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 Pain Descriptors / Indicators: Sore;Grimacing;Shooting;Tingling;Sharp;Stabbing Pain Intervention(s): Limited activity within patient's tolerance;Monitored during session;Repositioned    Home Living                      Prior Function            PT Goals (current goals can now be found in the care plan section) Acute Rehab PT Goals Patient Stated Goal: return home PT Goal Formulation: With patient Time For Goal Achievement: 09/29/19 Potential to Achieve Goals: Good Progress towards PT goals: Progressing toward goals    Frequency    7X/week      PT Plan Current plan remains appropriate    Co-evaluation              AM-PAC PT "6 Clicks" Mobility   Outcome Measure  Help needed turning from your back to your side while in a flat bed without using bedrails?: A Little Help needed moving from lying on your back to sitting on the side of a flat bed without using bedrails?: A Little Help needed moving to and from a bed to a chair  (including a wheelchair)?: A Little Help needed standing up from a chair using your arms (e.g., wheelchair or bedside chair)?: A Little Help needed to walk in hospital room?: A Little Help needed climbing 3-5 steps with a railing? : A Lot 6 Click Score: 17    End of Session Equipment Utilized During Treatment: Gait belt Activity Tolerance: Patient tolerated treatment well;Patient limited by pain Patient left: in chair;with chair alarm set;with call bell/phone within reach;with nursing/sitter in room Nurse Communication: Mobility status;Other (comment)(cognition status/performance yesterday) PT Visit Diagnosis: Other abnormalities of gait and mobility (R26.89);Unsteadiness on feet (R26.81);Muscle weakness (generalized) (M62.81);Pain Pain - Right/Left: Left Pain - part of body: Hand;Hip     Time: [id number] PT Time Calculation (min) (ACUTE ONLY): 23 min  Charges:  $Gait Training: 8-22 mins $Therapeutic Exercise: 8-22 mins                     Mickey Farber, PT, DPT   Acute Rehabilitation Department [phone]   Otho Bellows 09/17/2019, 9:09 AM

## 2019-09-17 NOTE — Progress Notes (Addendum)
  Subjective: [name] is a 78 y.o. male s/p left THA.  They are POD3.  Pt's pain is controlled.  Pt has ambulated with some difficulty.  His abd pain has improved and he denies any currently. Denies any fevers, chills, night sweats, malaise.  Patient had difficulty with confusion during PT session yesterday.   Objective: Vital signs in last 24 hours: Temp:  [98.6 F (37 C)-100 F (37.8 C)] 100 F (37.8 C) (10/30 0341) Pulse Rate:  [90-91] 91 (10/30 0341) Resp:  [18] 18 (10/30 0341) BP: (111-129)/(61-74) 111/74 (10/30 0341) SpO2:  [94 %-99 %] 99 % (10/30 0341)  Intake/Output from previous day: 10/29 0701 - 10/30 0700 In: 120 [P.O.:120] Out: 450 [Urine:450] Intake/Output this shift: No intake/output data recorded.  Exam:  No gross blood or drainage overlying the dressing 2+ DP pulse Sensation intact distally in the left foot Able to dorsiflex and plantarflex the left foot Alert and oriented to person, place, time, situation   Labs: Recent Labs    09/14/19 1849 09/15/19 0323 09/16/19 0338 09/17/19 0440  HGB 14.5 12.0* 11.7* 10.8*   Recent Labs    09/16/19 0338 09/17/19 0440  WBC 10.0 8.3  RBC 3.93* 3.60*  HCT 35.6* 32.3*  PLT 221 223   Recent Labs    09/16/19 0338 09/17/19 0440  NA 134* 135  K 4.0 3.8  CL 100 101  CO2 24 25  BUN 12 12  CREATININE 1.04 1.01  GLUCOSE 129* 126*  CALCIUM 8.5* 8.4*   No results for input(s): LABPT, INR in the last 72 hours.  Assessment/Plan: Pt is POD3 s/p left THA.    -Plan to discharge to home vs. SNF today or tomorrow pending PT eval later today.    -Discussed possibility of going to SNF for a few days due to patient's confusion/mobilty.  Patient will consider but much prefers going home with his wife.  Has a daughter who lives ~20 mins away who could help at home as well.  -Continue abx for UTI  -Follow-up with Urology as outpatient in ~4 days for foley catheter removal  -WBAT with a walker  -Okay to shower,  dressing is waterproof.  Cautioned patient against soaking dressing in bath/pool/body of water     [name] 09/17/2019, 8:34 AM

## 2019-09-17 NOTE — Progress Notes (Signed)
Spoke with Jamse Arn, RNCM from [facility]. Patient has a urology follow up on 09/22/2019 at 10:45am with Jiles Crocker at [facility]. RN notified patient and wife and wrote information on AVS discharge instructions.

## 2019-09-17 NOTE — Social Work (Signed)
CSW acknowledging consult for SNF placement.   D/c home today with [facility] per Jamse Arn' arrangements. Will have support from wife at home as well.  CSW signing off. Please consult if any additional needs arise.   Alexander Mt, [place] [facility] [phone]

## 2019-09-19 DIAGNOSIS — Z471 Aftercare following joint replacement surgery: Secondary | ICD-10-CM | POA: Diagnosis not present

## 2019-09-19 DIAGNOSIS — R339 Retention of urine, unspecified: Secondary | ICD-10-CM | POA: Diagnosis not present

## 2019-09-19 DIAGNOSIS — H5462 Unqualified visual loss, left eye, normal vision right eye: Secondary | ICD-10-CM | POA: Diagnosis not present

## 2019-09-19 DIAGNOSIS — F909 Attention-deficit hyperactivity disorder, unspecified type: Secondary | ICD-10-CM | POA: Diagnosis not present

## 2019-09-19 DIAGNOSIS — Z96642 Presence of left artificial hip joint: Secondary | ICD-10-CM | POA: Diagnosis not present

## 2019-09-19 DIAGNOSIS — Z86718 Personal history of other venous thrombosis and embolism: Secondary | ICD-10-CM | POA: Diagnosis not present

## 2019-09-19 DIAGNOSIS — B952 Enterococcus as the cause of diseases classified elsewhere: Secondary | ICD-10-CM | POA: Diagnosis not present

## 2019-09-19 DIAGNOSIS — E785 Hyperlipidemia, unspecified: Secondary | ICD-10-CM | POA: Diagnosis not present

## 2019-09-19 DIAGNOSIS — E119 Type 2 diabetes mellitus without complications: Secondary | ICD-10-CM | POA: Diagnosis not present

## 2019-09-19 DIAGNOSIS — I10 Essential (primary) hypertension: Secondary | ICD-10-CM | POA: Diagnosis not present

## 2019-09-19 DIAGNOSIS — Z7901 Long term (current) use of anticoagulants: Secondary | ICD-10-CM | POA: Diagnosis not present

## 2019-09-19 DIAGNOSIS — H409 Unspecified glaucoma: Secondary | ICD-10-CM | POA: Diagnosis not present

## 2019-09-19 DIAGNOSIS — N390 Urinary tract infection, site not specified: Secondary | ICD-10-CM | POA: Diagnosis not present

## 2019-09-19 DIAGNOSIS — Z87891 Personal history of nicotine dependence: Secondary | ICD-10-CM | POA: Diagnosis not present

## 2019-09-20 ENCOUNTER — Telehealth: Payer: Self-pay | Admitting: *Deleted

## 2019-09-20 NOTE — Care Plan (Signed)
RNCM call to patient for discharge call. He was discharged from hospital on Friday, 09/17/19. RNCM contacted [facility] prior to discharge and scheduled f/u with NP there as requested by Dr. Gloriann Loan during Urology consult in office. Urology f/u is scheduled for 09/22/19. Patient and wife aware. Wife states that they are doing well after discharge. Therapy has been out to their home yesterday. She is cleaning around catheter as instructed by staff and having no other issues. Will continue to f/u for any CM needs.

## 2019-09-20 NOTE — Telephone Encounter (Signed)
Ortho bundle D/C call completed. D/C from hospital on Friday, 09/17/19.

## 2019-09-22 ENCOUNTER — Telehealth: Payer: Self-pay | Admitting: *Deleted

## 2019-09-22 DIAGNOSIS — R338 Other retention of urine: Secondary | ICD-10-CM | POA: Diagnosis not present

## 2019-09-22 DIAGNOSIS — N35011 Post-traumatic bulbous urethral stricture: Secondary | ICD-10-CM | POA: Diagnosis not present

## 2019-09-22 NOTE — Telephone Encounter (Signed)
1 week ortho bundle call completed.

## 2019-09-22 NOTE — Care Plan (Signed)
RNCM call to check status at 1 week post-op. Patient's wife states he is doing very well. Urology appointment f/u today and catheter was discontinued. Patient has voided and instructed to continue with oral antibiotic. Wife reports therapy is coming out twice weekly and he is doing well with this. Confusion experienced in the hospital is improving. Reminded of f/u on 09/27/19 with dr. Marlou Sa. Will continue to monitor for CM needs.

## 2019-09-24 NOTE — Discharge Summary (Signed)
Physician Discharge Summary      Patient ID: [name] MRN: [id number] DOB/AGE: 77-18-1942 77 y.o.  Admit date: 09/14/2019 Discharge date: 09/17/2019  Admission Diagnoses:  Active Problems:   Essential hypertension   Diabetes mellitus type II, controlled ([place])   Hyperlipidemia   Acute urinary retention   PAC (premature atrial contraction)   Blocked premature atrial contraction   Discharge Diagnoses:  Same  Surgeries: Procedure(s): LEFT TOTAL HIP ARTHROPLASTY ANTERIOR APPROACH on 09/14/2019   Consultants:   Discharged Condition: Stable  Hospital Course: [name] is an 78 y.o. 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  They were brought to the operating room on 09/14/2019 and underwent the above named procedures.  Pt awoke from anesthesia without complication and was transferred to the floor. On POD0, pt complained of abd pain and hospitalist/urology consult was placed.  Urology placed a foley catheter which relieved patient's pain entirely.  Patient worked with therapy throughout his stay in the hospital. He had some confusion that was noticed by PT on POD2.  On POD3, the confusion had resolved and patient mobilized well in PT.  Patient was discharged home on POD3.  Pt will f/u with Dr. Marlou Sa in clinic in ~2 weeks. He will f/u with Urology one week postop for foley catheter removal.  He will continue abx treatment over the first postop week for continued treatment of preoperative UTI.    Antibiotics given:  Anti-infectives (From admission, onward)   Start     Dose/Rate Route Frequency Ordered Stop   09/16/19 0000  amoxicillin (AMOXIL) 500 MG capsule     500 mg Oral Every 8 hours 09/16/19 0818     09/14/19 2200  amoxicillin (AMOXIL) capsule 500 mg  Status:  Discontinued     500 mg Oral Every 8 hours 09/14/19 2058 09/17/19 2019   09/14/19 1800  ceFAZolin (ANCEF) IVPB 2g/100 mL  premix     2 g 200 mL/hr over 30 Minutes Intravenous Every 6 hours 09/14/19 1735 09/16/19 0914   09/14/19 1143  vancomycin (VANCOCIN) powder  Status:  Discontinued       As needed 09/14/19 1143 09/14/19 1401   09/14/19 1030  ceFAZolin (ANCEF) IVPB 2g/100 mL premix     2 g 200 mL/hr over 30 Minutes Intravenous On call to O.R. 09/14/19 [id number] 09/14/19 1107   09/14/19 0926  ceFAZolin (ANCEF) 2-4 GM/100ML-% IVPB    Note to Pharmacy: Block, Sarah   : cabinet override      09/14/19 0926 09/14/19 1107    .  Recent vital signs:  Vitals:   09/17/19 0834 09/17/19 1347  BP: 140/77 114/90  Pulse: 95 88  Resp: 18 18  Temp: 98.4 F (36.9 C) 98.2 F (36.8 C)  SpO2: 97% 100%    Recent laboratory studies:  Results for orders placed or performed during the hospital encounter of 09/14/19  Culture, Urine   Specimen: Urine, Catheterized  Result Value Ref Range   Specimen Description URINE, CATHETERIZED    Special Requests NONE    Culture      NO GROWTH Performed at [facility] [address], [place], [place] [address]    Report Status 09/15/2019 FINAL   Glucose, capillary  Result Value Ref Range   Glucose-Capillary 129 (H) 70 - 99 mg/dL   Comment 1 Notify RN    Comment 2 Document in Chart  Glucose, capillary  Result Value Ref Range   Glucose-Capillary 118 (H) 70 - 99 mg/dL  Comprehensive metabolic panel  Result Value Ref Range   Sodium 134 (L) 135 - 145 mmol/L   Potassium 3.8 3.5 - 5.1 mmol/L   Chloride 102 98 - 111 mmol/L   CO2 20 (L) 22 - 32 mmol/L   Glucose, Bld 169 (H) 70 - 99 mg/dL   BUN 10 8 - 23 mg/dL   Creatinine, Ser 1.20 0.61 - 1.24 mg/dL   Calcium 9.0 8.9 - 10.3 mg/dL   Total Protein 6.5 6.5 - 8.1 g/dL   Albumin 3.6 3.5 - 5.0 g/dL   AST 25 15 - 41 U/L   ALT 16 0 - 44 U/L   Alkaline Phosphatase 42 38 - 126 U/L   Total Bilirubin 1.0 0.3 - 1.2 mg/dL   GFR calc non Af Amer 58 (L) >60 mL/min   GFR calc Af Amer >60 >60 mL/min   Anion gap 12 5 - 15  CBC    Result Value Ref Range   WBC 16.1 (H) 4.0 - 10.5 K/uL   RBC 4.79 4.22 - 5.81 MIL/uL   Hemoglobin 14.5 13.0 - 17.0 g/dL   HCT 44.9 39.0 - 52.0 %   MCV 93.7 80.0 - 100.0 fL   MCH 30.3 26.0 - 34.0 pg   MCHC 32.3 30.0 - 36.0 g/dL   RDW 12.7 11.5 - 15.5 %   Platelets 254 150 - 400 K/uL   nRBC 0.0 0.0 - 0.2 %  Lipase, blood  Result Value Ref Range   Lipase 23 11 - 51 U/L  Urinalysis, Routine w reflex microscopic  Result Value Ref Range   Color, Urine YELLOW YELLOW   APPearance CLEAR CLEAR   Specific Gravity, Urine 1.009 1.005 - 1.030   pH 6.0 5.0 - 8.0   Glucose, UA NEGATIVE NEGATIVE mg/dL   Hgb urine dipstick LARGE (A) NEGATIVE   Bilirubin Urine NEGATIVE NEGATIVE   Ketones, ur NEGATIVE NEGATIVE mg/dL   Protein, ur NEGATIVE NEGATIVE mg/dL   Nitrite NEGATIVE NEGATIVE   Leukocytes,Ua NEGATIVE NEGATIVE   RBC / HPF >50 (H) 0 - 5 RBC/hpf   WBC, UA 0-5 0 - 5 WBC/hpf   Bacteria, UA FEW (A) NONE SEEN   Squamous Epithelial / LPF 0-5 0 - 5   Mucus PRESENT   Lipid panel  Result Value Ref Range   Cholesterol 166 0 - 200 mg/dL   Triglycerides 33 <150 mg/dL   HDL 78 >40 mg/dL   Total CHOL/HDL Ratio 2.1 RATIO   VLDL 7 0 - 40 mg/dL   LDL Cholesterol 81 0 - 99 mg/dL  CBC  Result Value Ref Range   WBC 10.7 (H) 4.0 - 10.5 K/uL   RBC 3.98 (L) 4.22 - 5.81 MIL/uL   Hemoglobin 12.0 (L) 13.0 - 17.0 g/dL   HCT 36.4 (L) 39.0 - 52.0 %   MCV 91.5 80.0 - 100.0 fL   MCH 30.2 26.0 - 34.0 pg   MCHC 33.0 30.0 - 36.0 g/dL   RDW 12.8 11.5 - 15.5 %   Platelets 213 150 - 400 K/uL   nRBC 0.0 0.0 - 0.2 %  Comprehensive metabolic panel  Result Value Ref Range   Sodium 134 (L) 135 - 145 mmol/L   Potassium 4.2 3.5 - 5.1 mmol/L   Chloride 102 98 - 111 mmol/L   CO2 24 22 - 32 mmol/L   Glucose, Bld 133 (H) 70 - 99 mg/dL  BUN 13 8 - 23 mg/dL   Creatinine, Ser 1.13 0.61 - 1.24 mg/dL   Calcium 8.6 (L) 8.9 - 10.3 mg/dL   Total Protein 5.5 (L) 6.5 - 8.1 g/dL   Albumin 3.0 (L) 3.5 - 5.0 g/dL   AST 21 15 -  41 U/L   ALT 14 0 - 44 U/L   Alkaline Phosphatase 34 (L) 38 - 126 U/L   Total Bilirubin 0.9 0.3 - 1.2 mg/dL   GFR calc non Af Amer >60 >60 mL/min   GFR calc Af Amer >60 >60 mL/min   Anion gap 8 5 - 15  Magnesium  Result Value Ref Range   Magnesium 1.7 1.7 - 2.4 mg/dL  Glucose, capillary  Result Value Ref Range   Glucose-Capillary 148 (H) 70 - 99 mg/dL  Glucose, capillary  Result Value Ref Range   Glucose-Capillary 135 (H) 70 - 99 mg/dL  Glucose, capillary  Result Value Ref Range   Glucose-Capillary 110 (H) 70 - 99 mg/dL  Glucose, capillary  Result Value Ref Range   Glucose-Capillary 131 (H) 70 - 99 mg/dL  CBC  Result Value Ref Range   WBC 10.0 4.0 - 10.5 K/uL   RBC 3.93 (L) 4.22 - 5.81 MIL/uL   Hemoglobin 11.7 (L) 13.0 - 17.0 g/dL   HCT 35.6 (L) 39.0 - 52.0 %   MCV 90.6 80.0 - 100.0 fL   MCH 29.8 26.0 - 34.0 pg   MCHC 32.9 30.0 - 36.0 g/dL   RDW 12.9 11.5 - 15.5 %   Platelets 221 150 - 400 K/uL   nRBC 0.0 0.0 - 0.2 %  Basic metabolic panel  Result Value Ref Range   Sodium 134 (L) 135 - 145 mmol/L   Potassium 4.0 3.5 - 5.1 mmol/L   Chloride 100 98 - 111 mmol/L   CO2 24 22 - 32 mmol/L   Glucose, Bld 129 (H) 70 - 99 mg/dL   BUN 12 8 - 23 mg/dL   Creatinine, Ser 1.04 0.61 - 1.24 mg/dL   Calcium 8.5 (L) 8.9 - 10.3 mg/dL   GFR calc non Af Amer >60 >60 mL/min   GFR calc Af Amer >60 >60 mL/min   Anion gap 10 5 - 15  Glucose, capillary  Result Value Ref Range   Glucose-Capillary 122 (H) 70 - 99 mg/dL  Glucose, capillary  Result Value Ref Range   Glucose-Capillary 112 (H) 70 - 99 mg/dL  Glucose, capillary  Result Value Ref Range   Glucose-Capillary 174 (H) 70 - 99 mg/dL  CBC  Result Value Ref Range   WBC 8.3 4.0 - 10.5 K/uL   RBC 3.60 (L) 4.22 - 5.81 MIL/uL   Hemoglobin 10.8 (L) 13.0 - 17.0 g/dL   HCT 32.3 (L) 39.0 - 52.0 %   MCV 89.7 80.0 - 100.0 fL   MCH 30.0 26.0 - 34.0 pg   MCHC 33.4 30.0 - 36.0 g/dL   RDW 12.8 11.5 - 15.5 %   Platelets 223 150 - 400 K/uL     nRBC 0.0 0.0 - 0.2 %  Basic metabolic panel  Result Value Ref Range   Sodium 135 135 - 145 mmol/L   Potassium 3.8 3.5 - 5.1 mmol/L   Chloride 101 98 - 111 mmol/L   CO2 25 22 - 32 mmol/L   Glucose, Bld 126 (H) 70 - 99 mg/dL   BUN 12 8 - 23 mg/dL   Creatinine, Ser 1.01 0.61 - 1.24 mg/dL   Calcium 8.4 (L)  8.9 - 10.3 mg/dL   GFR calc non Af Amer >60 >60 mL/min   GFR calc Af Amer >60 >60 mL/min   Anion gap 9 5 - 15  Glucose, capillary  Result Value Ref Range   Glucose-Capillary 126 (H) 70 - 99 mg/dL  Glucose, capillary  Result Value Ref Range   Glucose-Capillary 125 (H) 70 - 99 mg/dL  Glucose, capillary  Result Value Ref Range   Glucose-Capillary 194 (H) 70 - 99 mg/dL  Troponin I (High Sensitivity)  Result Value Ref Range   Troponin I (High Sensitivity) 14 <18 ng/L    Discharge Medications:   Allergies as of 09/17/2019   No Known Allergies     Medication List    STOP taking these medications   ampicillin 250 MG capsule Commonly known as: PRINCIPEN   aspirin EC 81 MG tablet   baclofen 10 MG tablet Commonly known as: LIORESAL   cefTRIAXone 2 g in dextrose 5 % 50 mL   HYDROcodone-acetaminophen 10-325 MG tablet Commonly known as: Norco   menthol-cetylpyridinium 3 MG lozenge Commonly known as: CEPACOL     TAKE these medications   amoxicillin 500 MG capsule Commonly known as: AMOXIL Take 1 capsule (500 mg total) by mouth every 8 (eight) hours.   brimonidine 0.2 % ophthalmic solution Commonly known as: ALPHAGAN Place 1 drop into the left eye 3 (three) times daily.   lisinopril 20 MG tablet Commonly known as: ZESTRIL Take 20 mg by mouth daily.   methocarbamol 500 MG tablet Commonly known as: ROBAXIN Take 1 tablet (500 mg total) by mouth every 8 (eight) hours as needed for muscle spasms.   naproxen sodium 220 MG tablet Commonly known as: ALEVE Take 440 mg by mouth daily as needed (pain).   oxyCODONE 5 MG immediate release tablet Commonly known as: Oxy  IR/ROXICODONE Take 1 tablet (5 mg total) by mouth every 4 (four) hours as needed for moderate pain (pain score 4-6).   rivaroxaban 10 MG Tabs tablet Commonly known as: XARELTO Take 1 tablet (10 mg total) by mouth daily. What changed:   medication strength  how much to take  when to take this  additional instructions   simvastatin 20 MG tablet Commonly known as: ZOCOR Take 20 mg by mouth daily.   sodium chloride 2 % ophthalmic solution Commonly known as: MURO 128 Place 1 drop into the left eye 3 (three) times daily.   Muro 128 5 % ophthalmic ointment Generic drug: sodium chloride Place 1 application into the left eye at bedtime.   timolol 0.5 % ophthalmic solution Commonly known as: TIMOPTIC Place 1 drop into the left eye 2 (two) times daily.       Diagnostic Studies: Dg C-arm 1-60 Min  Result Date: 09/14/2019 CLINICAL DATA:  Left hip replacement. EXAM: OPERATIVE left HIP (WITH PELVIS IF PERFORMED) 2 VIEWS TECHNIQUE: Fluoroscopic spot image(s) were submitted for interpretation post-operatively. Radiation exposure index: 46 seconds. COMPARISON:  August 30, 2019. FINDINGS: The left acetabular and femoral components appear to be well situated. Expected postoperative changes are seen in the surrounding soft tissues. IMPRESSION: Fluoroscopic guidance provided during left total hip arthroplasty. Electronically Signed   By: Marijo Conception M.D.   On: 09/14/2019 13:56   Dg Hip Port Unilat With Pelvis 1v Left  Result Date: 09/14/2019 CLINICAL DATA:  Post total left hip arthroplasty EXAM: DG HIP (WITH OR WITHOUT PELVIS) 1V PORT LEFT COMPARISON:  Intraoperative fluoroscopy 09/14/2019, comparison radiograph 08/30/2019 FINDINGS: Patient has undergone total  left hip arthroplasty with normal alignment of the articulating femoral stem in the screw fixed acetabular component. Expected postsurgical soft tissue changes including soft tissue gas and intra-articular gas are noted.  Mild-to-moderate degenerative changes are again seen in the right hip. Bones of the pelvis are otherwise congruent. Vascular calcium noted in the medial thighs. Remaining soft tissues are unremarkable. IMPRESSION: Normal postoperative appearance of the left total hip arthroplasty without evidence of acute hardware complication. Electronically Signed   By: Lovena Le M.D.   On: 09/14/2019 16:58   Dg Hip Operative Unilat W Or W/o Pelvis Left  Result Date: 09/14/2019 CLINICAL DATA:  Left hip replacement. EXAM: OPERATIVE left HIP (WITH PELVIS IF PERFORMED) 2 VIEWS TECHNIQUE: Fluoroscopic spot image(s) were submitted for interpretation post-operatively. Radiation exposure index: 46 seconds. COMPARISON:  August 30, 2019. FINDINGS: The left acetabular and femoral components appear to be well situated. Expected postoperative changes are seen in the surrounding soft tissues. IMPRESSION: Fluoroscopic guidance provided during left total hip arthroplasty. Electronically Signed   By: Marijo Conception M.D.   On: 09/14/2019 13:56   Xr Hip Unilat W Or W/o Pelvis 2-3 Views Left  Result Date: 09/04/2019 AP pelvis lateral left hip is reviewed.  Patient has severe end-stage hip arthritis with cystic changes both on the acetabular and femoral head side.  Slight shortening of that left lower extremity.  Right hip appears normal and spared of significant arthritis.  Remainder bony pelvis within normal limits.   Disposition:   Discharge Instructions    Call MD / Call 911   Complete by: As directed    If you experience chest pain or shortness of breath, CALL 911 and be transported to the hospital emergency room.  If you develope a fever above 101 F, pus (white drainage) or increased drainage or redness at the wound, or calf pain, call your surgeon's office.   Call MD / Call 911   Complete by: As directed    If you experience chest pain or shortness of breath, CALL 911 and be transported to the hospital emergency room.   If you develope a fever above 101 F, pus (white drainage) or increased drainage or redness at the wound, or calf pain, call your surgeon's office.   Call MD / Call 911   Complete by: As directed    If you experience chest pain or shortness of breath, CALL 911 and be transported to the hospital emergency room.  If you develope a fever above 101 F, pus (white drainage) or increased drainage or redness at the wound, or calf pain, call your surgeon's office.   Constipation Prevention   Complete by: As directed    Drink plenty of fluids.  Prune juice may be helpful.  You may use a stool softener, such as Colace (over the counter) 100 mg twice a day.  Use MiraLax (over the counter) for constipation as needed.   Constipation Prevention   Complete by: As directed    Drink plenty of fluids.  Prune juice may be helpful.  You may use a stool softener, such as Colace (over the counter) 100 mg twice a day.  Use MiraLax (over the counter) for constipation as needed.   Constipation Prevention   Complete by: As directed    Drink plenty of fluids.  Prune juice may be helpful.  You may use a stool softener, such as Colace (over the counter) 100 mg twice a day.  Use MiraLax (over the counter) for constipation as  needed.   Diet - low sodium heart healthy   Complete by: As directed    Diet - low sodium heart healthy   Complete by: As directed    Diet - low sodium heart healthy   Complete by: As directed    Discharge instructions   Complete by: As directed    You may shower, dressing is waterproof.  Do not remove the dressing, we will remove it at your first post-op appointment.  Do not take a bath or soak the knee in a tub or pool.  You may weightbear as you can tolerate on the operative leg with a walker.  Follow-up with Urology next week for catheter removal.  Course of antibiotics has been prescribed (Amoxicillin).  Take the entire course of antibiotics even if you feel that your urination symptoms have  improved.  You will follow-up with Dr. Marlou Sa in the clinic in 1-2 weeks at your given appointment date.   Discharge instructions   Complete by: As directed    Weightbearing as tolerated with crutches or walker Okay to shower dressing is waterproof Please arrange for follow-up next week with the urologist in order to discontinue Foley Follow-up with [place] in 2 weeks at [facility] care.   Increase activity slowly as tolerated   Complete by: As directed    Increase activity slowly as tolerated   Complete by: As directed    Increase activity slowly as tolerated   Complete by: As directed       Follow-up Information    Marlou Sa, Tonna Corner, MD. Go on 09/27/2019.   Specialty: Orthopedic Surgery Why: At 1:45 pm at new office location for a 2 week post-op check with Dr. Marlou Sa. [address] [place], [place] [address] Phone number the same. Contact information: [place] [address] [phone]        [facility] Follow up.   Specialty: Home Health Services Why: You have been authorized for 5 home health physical therapy visits after short hospital stay. Someone from the agency will be in contact with you to arrange your appointment time in your home. Contact information: [address] [place] [address] [phone]        Isaiah Serge, NP Follow up.   Specialties: Cardiology, Radiology Why: The office will contact you about a monitor that you wear for 30 days.  Keep December appointment. Contact information: [place] [address] [place] [address] [phone]            Signed: [name] 09/24/2019, 9:48 PM

## 2019-09-27 ENCOUNTER — Telehealth: Payer: Self-pay

## 2019-09-27 ENCOUNTER — Other Ambulatory Visit: Payer: Self-pay

## 2019-09-27 ENCOUNTER — Ambulatory Visit: Payer: Self-pay

## 2019-09-27 ENCOUNTER — Encounter: Payer: Self-pay | Admitting: Orthopedic Surgery

## 2019-09-27 ENCOUNTER — Ambulatory Visit (INDEPENDENT_AMBULATORY_CARE_PROVIDER_SITE_OTHER): Payer: Medicare Other | Admitting: Orthopedic Surgery

## 2019-09-27 ENCOUNTER — Telehealth: Payer: Self-pay | Admitting: *Deleted

## 2019-09-27 DIAGNOSIS — Z96642 Presence of left artificial hip joint: Secondary | ICD-10-CM

## 2019-09-27 NOTE — Care Plan (Signed)
RNCM met with patient in office today for his 2 week post-op. Status/post left THA on 09/14/19 per Dr. Marlou Sa. He is ambulating with a walker today, but states he has worked with use of a cane with HHPT. He reports this is going well. Foley catheter has been removed per Urology last week. Discussed with patient and wife coming off of Xarelto due to cost. Dr. Marlou Sa advised to finish this Rx, then switch to a daily baby aspirin. Ordered bilateral LE ultrasound to ensure no DVTs before stopping anti-coagulation altogether. No pain or tenderness in calves today. F/U in 6 weeks scheduled for 11/08/19.

## 2019-09-27 NOTE — Telephone Encounter (Signed)
14 day Ortho bundle call completed.  

## 2019-09-27 NOTE — Telephone Encounter (Signed)
Dr Marlou Sa ordered Bilateral LE doppler r/o DVT for patient. He is s/p THA and is on blood thinners. Patient is wanting to come off of them but Dr Marlou Sa wanting to make sure not clot first. I have put order in but I am certain I forgot to mark as urgent, also he has not been scheduled.

## 2019-09-27 NOTE — Telephone Encounter (Signed)
Spoke to pt, went over monitor instructions. Verified address. 30 day [employer] Event monitor ordered.  

## 2019-09-28 ENCOUNTER — Telehealth: Payer: Self-pay | Admitting: Radiology

## 2019-09-28 ENCOUNTER — Ambulatory Visit (HOSPITAL_COMMUNITY)
Admission: RE | Admit: 2019-09-28 | Discharge: 2019-09-28 | Disposition: A | Discharge status: Home / Self Care | Payer: Medicare Other | Source: Ambulatory Visit | Attending: Orthopedic Surgery | Admitting: Orthopedic Surgery

## 2019-09-28 DIAGNOSIS — Z96642 Presence of left artificial hip joint: Secondary | ICD-10-CM | POA: Diagnosis not present

## 2019-09-28 NOTE — Telephone Encounter (Signed)
Patient is negative for DVT and superficial vein thrombosis. [name] sent the patient home after advising him he was negative for DVT. Please call him if things change or if you need to advise him further.

## 2019-09-28 NOTE — Telephone Encounter (Signed)
FYI

## 2019-09-28 NOTE — Telephone Encounter (Signed)
sw [name] with Vein/Vascular she will contact pt to schedule appt.

## 2019-09-29 NOTE — Telephone Encounter (Signed)
He just needs to take 1 baby aspirin a day for 3 weeks

## 2019-09-29 NOTE — Telephone Encounter (Signed)
I called and spoke with patient's wife and advised. She states patient took last Xarelto today.  He will start baby aspirin tomorrow.

## 2019-10-01 ENCOUNTER — Encounter: Payer: Self-pay | Admitting: Orthopedic Surgery

## 2019-10-01 NOTE — Progress Notes (Signed)
Post-Op Visit Note   Patient: [name]           Date of Birth: Jan 19, 1941           MRN: [id number] Visit Date: 09/27/2019 PCP: Brandon Lass, MD   Assessment & Plan:  Chief Complaint: No chief complaint on file.  Visit Diagnoses:  1. Status post total replacement of left hip     Plan: [name] is a patient with left total hip replacement now about 2 weeks out.  He is doing well.  Ambulating with a walker and getting home health physical therapy.  On exam he has equal leg lengths and good hip flexion strength.  Incision is intact.  X-rays look good.  Plan is to continue home health physical therapy at home.  He does have a little bit of calf tenderness today but at the time of this dictation ultrasound bilateral lower extremities to rule out DVT due to his history of DVT was performed and they were negative.  He is okay to come off his Xarelto and just take 1 baby aspirin a day for the next 3 weeks.  Come back in 6 weeks for final check.  Follow-Up Instructions: Return in about 6 weeks (around 11/08/2019).   Orders:  Orders Placed This Encounter  Procedures  . XR HIP UNILAT W OR W/O PELVIS 2-3 VIEWS LEFT  . VAS [place] LOWER EXTREMITY VENOUS (DVT)   No orders of the defined types were placed in this encounter.   Imaging: No results found.  PMFS History: Patient Active Problem List   Diagnosis Date Noted  . PAC (premature atrial contraction)   . Blocked premature atrial contraction   . Acute urinary retention 09/14/2019  . Protein-calorie malnutrition, severe ([place]) 04/28/2014  . Acute blood loss anemia 04/28/2014  . Septic joint ([place]) 04/25/2014  . DVT of lower extremity, bilateral ([place]) 04/01/2014  . Bacterial infection of knee joint ([place]) 03/29/2014  . Essential hypertension 03/01/2014  . Chest pain 03/01/2014  . Diabetes mellitus type II, controlled ([place]) 03/01/2014  . Hyperlipidemia 03/01/2014   Past Medical History:  Diagnosis Date  . Arthritis   . Attention  deficit disorder   . Blind left eye   . Diabetes mellitus without complication ([place])   . Hyperlipidemia   . Hypertension     Family History  Problem Relation Age of Onset  . Heart attack Father   . Heart attack Sister   . Heart attack Brother     Past Surgical History:  Procedure Laterality Date  . EYE SURGERY Left    x3  . KNEE ARTHROSCOPY Bilateral 03/29/2014   Procedure: RIGHT KNEE ARTHROSCOPY AND DEBRIDEMENT, LEFT KNEE ASPIRATION,  LEFT KNEE ARTHROSCOPY AND DEBRIDEMENT.;  Surgeon: Meredith Pel, MD;  Location: [facility];  Service: Orthopedics;  Laterality: Bilateral;  RIGHT KNEE ARTHROSCOPY AND DEBRIDEMENT, LEFT KNEE ASPIRATION,  LEFT KNEE ARTHROSCOPY AND DEBRIDEMENT.  Marland Kitchen KNEE ARTHROTOMY Bilateral 04/26/2014   Procedure: KNEE ARTHROTOMY, SYNOVECTOMY, ANTIBIOTIC BEAD PLACEMENT.;  Surgeon: Meredith Pel, MD;  Location: [facility];  Service: Orthopedics;  Laterality: Bilateral;  . lipoma removal, right upper back    . TONSILLECTOMY    . TOTAL HIP ARTHROPLASTY Left 09/14/2019  . TOTAL HIP ARTHROPLASTY Left 09/14/2019   Procedure: LEFT TOTAL HIP ARTHROPLASTY ANTERIOR APPROACH;  Surgeon: Meredith Pel, MD;  Location: [facility];  Service: Orthopedics;  Laterality: Left;   Social History   Occupational History  . Not on file  Tobacco Use  .  Smoking status: Former Research scientist (life sciences)  . Smokeless tobacco: Never Used  Substance and Sexual Activity  . Alcohol use: Yes    Comment: occasional   . Drug use: No  . Sexual activity: Not on file

## 2019-10-04 ENCOUNTER — Ambulatory Visit (INDEPENDENT_AMBULATORY_CARE_PROVIDER_SITE_OTHER): Payer: Medicare Other

## 2019-10-04 ENCOUNTER — Encounter: Payer: Self-pay | Admitting: Interventional Cardiology

## 2019-10-04 ENCOUNTER — Telehealth: Payer: Self-pay | Admitting: Cardiology

## 2019-10-04 DIAGNOSIS — I491 Atrial premature depolarization: Secondary | ICD-10-CM | POA: Diagnosis not present

## 2019-10-04 NOTE — Telephone Encounter (Signed)
I received a call from [employer] monitoring company that pt is in afib, rates 90's-100. I called the patient and he is asymptomatic. His med list has Xarelto, however, this was only after hip replacement and he is no longer taking it. He will likely need anticoagulation.  I will forward this note to Dr. Radford Pax and to our triage nurse for further recommendations and perhaps have pt seen sooner than his appt scheduled for 12/3.  Daune Perch, AGNP-C [facility] 10/04/2019  6:37 PM

## 2019-10-05 NOTE — Telephone Encounter (Signed)
Call made to Pt.  Advised he is scheduled for a virtual visit on Monday 10/11/2019 with LI to discuss afib found on monitor and discuss medications.  Pt in agreement.  Will get strip scanned in to chart for LI.

## 2019-10-06 DIAGNOSIS — E1169 Type 2 diabetes mellitus with other specified complication: Secondary | ICD-10-CM | POA: Diagnosis not present

## 2019-10-06 DIAGNOSIS — I10 Essential (primary) hypertension: Secondary | ICD-10-CM | POA: Diagnosis not present

## 2019-10-06 DIAGNOSIS — H35039 Hypertensive retinopathy, unspecified eye: Secondary | ICD-10-CM | POA: Diagnosis not present

## 2019-10-06 DIAGNOSIS — E7800 Pure hypercholesterolemia, unspecified: Secondary | ICD-10-CM | POA: Diagnosis not present

## 2019-10-06 DIAGNOSIS — M179 Osteoarthritis of knee, unspecified: Secondary | ICD-10-CM | POA: Diagnosis not present

## 2019-10-06 DIAGNOSIS — I480 Paroxysmal atrial fibrillation: Secondary | ICD-10-CM | POA: Diagnosis not present

## 2019-10-06 DIAGNOSIS — M1612 Unilateral primary osteoarthritis, left hip: Secondary | ICD-10-CM | POA: Diagnosis not present

## 2019-10-06 NOTE — Telephone Encounter (Signed)
Is he still on Xarelto at a lower dose for his hip?  Traci

## 2019-10-07 ENCOUNTER — Other Ambulatory Visit: Payer: Self-pay | Admitting: Cardiology

## 2019-10-07 MED ORDER — APIXABAN 5 MG PO TABS: 5.0000 mg | ORAL_TABLET | Freq: Two times a day (BID) | ORAL | 3 refills | Status: DC

## 2019-10-07 NOTE — Telephone Encounter (Signed)
Thank you so much Jinny Blossom!

## 2019-10-07 NOTE — Telephone Encounter (Addendum)
I have called in a 1 month free copay card to his pharmacy to help with initial costs. They stated follow up copay would be $262 - pt likely has deductible since typical copay with Med D plans runs ~$45/month. Would recommend sampling patient for ~1-2 weeks after he uses his 1 month free supply to get him to the end of the year. Will need to discuss that pt will need to pay through deductible in January when his plan resets. Alternative would be to look for a different Medicare part D plan with a lower/no deductible.

## 2019-10-07 NOTE — Telephone Encounter (Signed)
Thanks Gae Bon and please get him into afib clinic

## 2019-10-07 NOTE — Telephone Encounter (Signed)
Received call from Gae Bon - she asked me to reach out to pt to discuss. Called and spoke with his wife - discussed that [employer] has the best rate for Eliquis ($80-90/3 month supply). She states she will work on changing pt's Part D insurance plan (currently has [employer] which has a high deductible) to [employer] for next year. Advised her that first month supply of Eliquis will be ready at the pharmacy today for free.

## 2019-10-07 NOTE — Telephone Encounter (Signed)
Strips reviewed from cardiac monitor, atrial fibrillation. I have called and talked to patient's wife and we will start Eliquis 5 mg BID for stroke risk reduction with CHA2DS2/VAS Stroke Risk Score of 4 (HTN, DM, Age (2)). His wife is concerned that the Xarelto that he took surrounding his hip replacement was over $200 and she says she would not be able to afford that. I advised her that if the Elqiuis is also expensive to call our office and we may be able to provide a card or help him get patient assistance.  I will also alert [employer] [employer] and our pharmacist for possible assitance.

## 2019-10-07 NOTE — Progress Notes (Signed)
Strips reviewed from cardiac monitor, atrial fibrillation. I have called and talked to patient's wife and we will start Eliquis 5 mg BID for stroke risk reduction with CHA2DS2/VAS Stroke Risk Score of 4 (HTN, DM, Age (2)). His wife is concerned that the Xarelto that he took surrounding his hip replacement was over $200 and she says she would not be able to afford that. I advised her that if the Elqiuis is also expensive to call our office and we may be able to provide a card or help him get patient assistance.

## 2019-10-07 NOTE — Telephone Encounter (Signed)
His Xarelto was finished, no longer taking. I wanted the the strips to be reviewed (monitoring company said they would send them that night). If afib confirmed, can restart Xarelto at full dose 20 mg daily vs eliquis 5 mg BID (considering his age and lower bleeding risk with eliquis). -I will leave that to Dr. Radford Pax.

## 2019-10-10 NOTE — Progress Notes (Signed)
Virtual Visit via Telephone Note   This visit type was conducted due to national recommendations for restrictions regarding the COVID-19 Pandemic (e.g. social distancing) in an effort to limit this patient's exposure and mitigate transmission in our community.  Due to his co-morbid illnesses, this patient is at least at moderate risk for complications without adequate follow up.  This format is felt to be most appropriate for this patient at this time.  The patient did not have access to video technology/had technical difficulties with video requiring transitioning to audio format only (telephone).  All issues noted in this document were discussed and addressed.  No physical exam could be performed with this format.  Please refer to the patient's chart for his  consent to telehealth for [facility]   Date:  10/11/2019   ID:  [name], DOB 09-13-1941, MRN [id number]  Patient Location: Home Provider Location: Office  PCP:  Kathyrn Lass, MD  Cardiologist:  Sinclair Grooms, MD  Electrophysiologist:  None   Evaluation Performed:  Follow-Up Visit  Chief Complaint:  Atrial fib   History of Present Illness:    [name] is a 78 y.o. 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 Marlou Sa. Pt had not been seen by Dr. Tamala Julian since 2015. In review pt with non conducted PACs.   Neg DVT  On outpt monitor pt with PAF and eliquis was added.    Needs echo he does have 30 days of eliquis but will have trouble affording, he is filling out paperwork with the company.  No bleeding on meds.  No chest pain.  No SOB.  He does not have BP cuff, will see if office could provide.    The patient does not have symptoms concerning for COVID-19 infection (fever, chills, cough, or new shortness of breath).    Past Medical History:  Diagnosis Date  . Arthritis   . Attention  deficit disorder   . Blind left eye   . Diabetes mellitus without complication ([place])   . Hyperlipidemia   . Hypertension    Past Surgical History:  Procedure Laterality Date  . EYE SURGERY Left    x3  . KNEE ARTHROSCOPY Bilateral 03/29/2014   Procedure: RIGHT KNEE ARTHROSCOPY AND DEBRIDEMENT, LEFT KNEE ASPIRATION,  LEFT KNEE ARTHROSCOPY AND DEBRIDEMENT.;  Surgeon: Meredith Pel, MD;  Location: [place];  Service: Orthopedics;  Laterality: Bilateral;  RIGHT KNEE ARTHROSCOPY AND DEBRIDEMENT, LEFT KNEE ASPIRATION,  LEFT KNEE ARTHROSCOPY AND DEBRIDEMENT.  Marland Kitchen KNEE ARTHROTOMY Bilateral 04/26/2014   Procedure: KNEE ARTHROTOMY, SYNOVECTOMY, ANTIBIOTIC BEAD PLACEMENT.;  Surgeon: Meredith Pel, MD;  Location: [place];  Service: Orthopedics;  Laterality: Bilateral;  . lipoma removal, right upper back    . TONSILLECTOMY    . TOTAL HIP ARTHROPLASTY Left 09/14/2019  . TOTAL HIP ARTHROPLASTY Left 09/14/2019   Procedure: LEFT TOTAL HIP ARTHROPLASTY ANTERIOR APPROACH;  Surgeon: Meredith Pel, MD;  Location: [place];  Service: Orthopedics;  Laterality: Left;     Current Meds  Medication Sig  . apixaban (ELIQUIS) 5 MG TABS tablet Take 1 tablet (5 mg total) by mouth 2 (two) times daily.  . brimonidine (ALPHAGAN) 0.2 % ophthalmic solution Place 1 drop into the left eye 3 (three) times daily.   . simvastatin (ZOCOR) 20 MG tablet Take 20 mg  by mouth daily.  . sodium chloride (MURO 128) 2 % ophthalmic solution Place 1 drop into the left eye 3 (three) times daily.  . sodium chloride (MURO 128) 5 % ophthalmic ointment Place 1 application into the left eye at bedtime.  . timolol (TIMOPTIC) 0.5 % ophthalmic solution Place 1 drop into the left eye 2 (two) times daily.      Allergies:   Patient has no known allergies.   Social History   Tobacco Use  . Smoking status: Former Research scientist (life sciences)  . Smokeless tobacco: Never Used  Substance Use Topics  . Alcohol use: Yes    Comment: occasional   . Drug use: No      Family Hx: The patient's family history includes Heart attack in his brother, father, and sister.  ROS:   Please see the history of present illness.    General:no colds or fevers, no weight changes Skin:no rashes or ulcers HEENT:no blurred vision, no congestion CV:see HPI PUL:see HPI GI:no diarrhea constipation or melena, no indigestion GU:no hematuria, no dysuria MS:no joint pain, no claudication just with total hip and doing well Neuro:no syncope, no lightheadedness Endo:no diabetes, no thyroid disease  All other systems reviewed and are negative.   Prior CV studies:   The following studies were reviewed today:  Echo 2015 normal.  Labs/Other Tests and Data Reviewed:    EKG:  An ECG dated 09/14/19 was personally reviewed today and demonstrated:  SR with early repol  Recent Labs: 09/15/2019: ALT 14; Magnesium 1.7 09/17/2019: BUN 12; Creatinine, Ser 1.01; Hemoglobin 10.8; Platelets 223; Potassium 3.8; Sodium 135   Recent Lipid Panel Lab Results  Component Value Date/Time   CHOL 166 09/15/2019 03:23 AM   TRIG 33 09/15/2019 03:23 AM   HDL 78 09/15/2019 03:23 AM   CHOLHDL 2.1 09/15/2019 03:23 AM   LDLCALC 81 09/15/2019 03:23 AM    Wt Readings from Last 3 Encounters:  10/11/19 170 lb (77.1 kg)  09/14/19 177 lb 9.6 oz (80.6 kg)  09/07/19 177 lb 9.6 oz (80.6 kg)     Objective:    Vital Signs:  Ht _0  (1.727 m)   Wt 170 lb (77.1 kg)   BMI 25.85 kg/m    VITAL SIGNS:  reviewed  General voice strong Pulmonary can speak in complete sentences without SOB Neuro A&O X 3  follows command  ASSESSMENT & PLAN:    1. PAF on Zio patch, has not yet stopped wearing.  Now on eliquis, rate was controlled will check CBC and BMP and echo. 2. nonconducted PACs in hospital.   COVID-19 Education: The signs and symptoms of COVID-19 were discussed with the patient and how to seek care for testing (follow up with PCP or arrange E-visit).  The importance of social distancing was  discussed today.  Time:   Today, I have spent 10  minutes with the patient with telehealth technology discussing the above problems.     Medication Adjustments/Labs and Tests Ordered: Current medicines are reviewed at length with the patient today.  Concerns regarding medicines are outlined above.   Tests Ordered: Orders Placed This Encounter  Procedures  . CBC  . Comp Met (CMET)  . ECHOCARDIOGRAM COMPLETE    Medication Changes: No orders of the defined types were placed in this encounter.   Follow Up:  In Person in 3 month(s)  Signed, Cecilie Kicks, NP  10/11/2019 9:29 PM    [facility]

## 2019-10-11 ENCOUNTER — Other Ambulatory Visit: Payer: Self-pay

## 2019-10-11 ENCOUNTER — Encounter: Payer: Self-pay | Admitting: Cardiology

## 2019-10-11 ENCOUNTER — Other Ambulatory Visit: Payer: Medicare Other | Admitting: *Deleted

## 2019-10-11 ENCOUNTER — Telehealth: Payer: Self-pay

## 2019-10-11 ENCOUNTER — Telehealth (INDEPENDENT_AMBULATORY_CARE_PROVIDER_SITE_OTHER): Payer: Medicare Other | Admitting: Cardiology

## 2019-10-11 ENCOUNTER — Encounter (HOSPITAL_COMMUNITY): Payer: Self-pay | Admitting: Cardiology

## 2019-10-11 VITALS — Ht 68.0 in | Wt 170.0 lb

## 2019-10-11 DIAGNOSIS — I4891 Unspecified atrial fibrillation: Secondary | ICD-10-CM

## 2019-10-11 DIAGNOSIS — I480 Paroxysmal atrial fibrillation: Secondary | ICD-10-CM

## 2019-10-11 DIAGNOSIS — I491 Atrial premature depolarization: Secondary | ICD-10-CM

## 2019-10-11 NOTE — Patient Instructions (Addendum)
Medication Instructions:  Your physician recommends that you continue on your current medications as directed. Please refer to the Current Medication list given to you today.' *If you need a refill on your cardiac medications before your next appointment, please call your pharmacy*  Lab Work: FUTURE: [place] today  If you have labs (blood work) drawn today and your tests are completely normal, you will receive your results only by: Marland Kitchen MyChart Message (if you have MyChart) OR . A paper copy in the mail If you have any lab test that is abnormal or we need to change your treatment, we will call you to review the results.  Testing/Procedures: Your physician has requested that you have an echocardiogram in 2-3 weeks  Echocardiography is a painless test that uses sound waves to create images of your heart. It provides your doctor with information about the size and shape of your heart and how well your heart's chambers and valves are working. This procedure takes approximately one hour. There are no restrictions for this procedure.    Follow-Up: At [facility], you and your health needs are our priority.  As part of our continuing mission to provide you with exceptional heart care, we have created designated Provider Care Teams.  These Care Teams include your primary Cardiologist (physician) and Advanced Practice Providers (APPs -  Physician Assistants and Nurse Practitioners) who all work together to provide you with the care you need, when you need it.  Your next appointment:   You are scheduled to see Dr. Tamala Julian on 01/13/2020 @ 8:40 AM  Provider:   You may see Sinclair Grooms, MD or one of the following Advanced Practice Providers on your designated Care Team:    Truitt Merle, NP  Cecilie Kicks, NP  Kathyrn Drown, NP   Other Instructions

## 2019-10-11 NOTE — Telephone Encounter (Signed)

## 2019-10-12 ENCOUNTER — Inpatient Hospital Stay (HOSPITAL_COMMUNITY)
Admission: EM | Admit: 2019-10-12 | Discharge: 2019-10-14 | DRG: 871 | Disposition: A | Discharge status: Home / Self Care | Payer: Medicare Other | Attending: Internal Medicine | Admitting: Internal Medicine

## 2019-10-12 ENCOUNTER — Emergency Department (HOSPITAL_COMMUNITY): Payer: Medicare Other

## 2019-10-12 ENCOUNTER — Encounter (HOSPITAL_COMMUNITY): Payer: Self-pay

## 2019-10-12 ENCOUNTER — Inpatient Hospital Stay (HOSPITAL_COMMUNITY): Payer: Medicare Other

## 2019-10-12 DIAGNOSIS — Z87891 Personal history of nicotine dependence: Secondary | ICD-10-CM | POA: Diagnosis not present

## 2019-10-12 DIAGNOSIS — H5462 Unqualified visual loss, left eye, normal vision right eye: Secondary | ICD-10-CM | POA: Diagnosis present

## 2019-10-12 DIAGNOSIS — B961 Klebsiella pneumoniae [K. pneumoniae] as the cause of diseases classified elsewhere: Secondary | ICD-10-CM | POA: Diagnosis present

## 2019-10-12 DIAGNOSIS — Z86718 Personal history of other venous thrombosis and embolism: Secondary | ICD-10-CM | POA: Diagnosis not present

## 2019-10-12 DIAGNOSIS — Z20828 Contact with and (suspected) exposure to other viral communicable diseases: Secondary | ICD-10-CM | POA: Diagnosis present

## 2019-10-12 DIAGNOSIS — E119 Type 2 diabetes mellitus without complications: Secondary | ICD-10-CM

## 2019-10-12 DIAGNOSIS — I480 Paroxysmal atrial fibrillation: Secondary | ICD-10-CM | POA: Diagnosis present

## 2019-10-12 DIAGNOSIS — Z79899 Other long term (current) drug therapy: Secondary | ICD-10-CM

## 2019-10-12 DIAGNOSIS — Z833 Family history of diabetes mellitus: Secondary | ICD-10-CM | POA: Diagnosis not present

## 2019-10-12 DIAGNOSIS — R0902 Hypoxemia: Secondary | ICD-10-CM | POA: Diagnosis not present

## 2019-10-12 DIAGNOSIS — Z8249 Family history of ischemic heart disease and other diseases of the circulatory system: Secondary | ICD-10-CM

## 2019-10-12 DIAGNOSIS — Z7901 Long term (current) use of anticoagulants: Secondary | ICD-10-CM

## 2019-10-12 DIAGNOSIS — Z7984 Long term (current) use of oral hypoglycemic drugs: Secondary | ICD-10-CM

## 2019-10-12 DIAGNOSIS — D649 Anemia, unspecified: Secondary | ICD-10-CM | POA: Diagnosis present

## 2019-10-12 DIAGNOSIS — N179 Acute kidney failure, unspecified: Secondary | ICD-10-CM | POA: Diagnosis present

## 2019-10-12 DIAGNOSIS — N390 Urinary tract infection, site not specified: Secondary | ICD-10-CM | POA: Diagnosis present

## 2019-10-12 DIAGNOSIS — N281 Cyst of kidney, acquired: Secondary | ICD-10-CM | POA: Diagnosis present

## 2019-10-12 DIAGNOSIS — E785 Hyperlipidemia, unspecified: Secondary | ICD-10-CM | POA: Diagnosis present

## 2019-10-12 DIAGNOSIS — Z96642 Presence of left artificial hip joint: Secondary | ICD-10-CM | POA: Diagnosis present

## 2019-10-12 DIAGNOSIS — A419 Sepsis, unspecified organism: Secondary | ICD-10-CM | POA: Diagnosis not present

## 2019-10-12 DIAGNOSIS — R4182 Altered mental status, unspecified: Secondary | ICD-10-CM | POA: Diagnosis not present

## 2019-10-12 DIAGNOSIS — G9341 Metabolic encephalopathy: Secondary | ICD-10-CM | POA: Diagnosis present

## 2019-10-12 DIAGNOSIS — R410 Disorientation, unspecified: Secondary | ICD-10-CM | POA: Diagnosis not present

## 2019-10-12 DIAGNOSIS — R404 Transient alteration of awareness: Secondary | ICD-10-CM | POA: Diagnosis not present

## 2019-10-12 DIAGNOSIS — I158 Other secondary hypertension: Secondary | ICD-10-CM | POA: Diagnosis present

## 2019-10-12 DIAGNOSIS — R4020 Unspecified coma: Secondary | ICD-10-CM | POA: Diagnosis not present

## 2019-10-12 DIAGNOSIS — R509 Fever, unspecified: Secondary | ICD-10-CM | POA: Diagnosis not present

## 2019-10-12 DIAGNOSIS — I4891 Unspecified atrial fibrillation: Secondary | ICD-10-CM | POA: Diagnosis not present

## 2019-10-12 LAB — URINALYSIS, ROUTINE W REFLEX MICROSCOPIC
Bilirubin Urine: NEGATIVE
Glucose, UA: NEGATIVE mg/dL
Ketones, ur: NEGATIVE mg/dL
Nitrite: POSITIVE — AB
Protein, ur: 30 mg/dL — AB
Specific Gravity, Urine: 1.014 (ref 1.005–1.030)
pH: 6 (ref 5.0–8.0)

## 2019-10-12 LAB — RESPIRATORY PANEL BY PCR

## 2019-10-12 LAB — CBC WITH DIFFERENTIAL/PLATELET
Abs Immature Granulocytes: 0.12 10*3/uL — ABNORMAL HIGH (ref 0.00–0.07)
Basophils Absolute: 0 10*3/uL (ref 0.0–0.1)
Basophils Relative: 0 %
Eosinophils Absolute: 0 10*3/uL (ref 0.0–0.5)
Eosinophils Relative: 0 %
HCT: 39 % (ref 39.0–52.0)
Hemoglobin: 12.5 g/dL — ABNORMAL LOW (ref 13.0–17.0)
Immature Granulocytes: 1 %
Lymphocytes Relative: 3 %
Lymphs Abs: 0.5 10*3/uL — ABNORMAL LOW (ref 0.7–4.0)
MCH: 29.1 pg (ref 26.0–34.0)
MCHC: 32.1 g/dL (ref 30.0–36.0)
MCV: 90.7 fL (ref 80.0–100.0)
Monocytes Absolute: 0.3 10*3/uL (ref 0.1–1.0)
Monocytes Relative: 2 %
Neutro Abs: 15.7 10*3/uL — ABNORMAL HIGH (ref 1.7–7.7)
Neutrophils Relative %: 94 %
Platelets: 319 10*3/uL (ref 150–400)
RBC: 4.3 MIL/uL (ref 4.22–5.81)
RDW: 13.3 % (ref 11.5–15.5)
WBC: 16.7 10*3/uL — ABNORMAL HIGH (ref 4.0–10.5)
nRBC: 0 % (ref 0.0–0.2)

## 2019-10-12 LAB — CBC
Hematocrit: 36.1 % — ABNORMAL LOW (ref 37.5–51.0)
Hemoglobin: 12.1 g/dL — ABNORMAL LOW (ref 13.0–17.7)
MCH: 29.2 pg (ref 26.6–33.0)
MCHC: 33.5 g/dL (ref 31.5–35.7)
MCV: 87 fL (ref 79–97)
Platelets: 409 10*3/uL (ref 150–450)
RBC: 4.15 x10E6/uL (ref 4.14–5.80)
RDW: 12.7 % (ref 11.6–15.4)
WBC: 8.3 10*3/uL (ref 3.4–10.8)

## 2019-10-12 LAB — COMPREHENSIVE METABOLIC PANEL
ALT: 10 IU/L (ref 0–44)
ALT: 13 U/L (ref 0–44)
AST: 13 IU/L (ref 0–40)
AST: 21 U/L (ref 15–41)
Albumin/Globulin Ratio: 1.3 (ref 1.2–2.2)
Albumin: 4 g/dL (ref 3.7–4.7)
Albumin: 3.2 g/dL — ABNORMAL LOW (ref 3.5–5.0)
Alkaline Phosphatase: 78 IU/L (ref 39–117)
Alkaline Phosphatase: 70 U/L (ref 38–126)
Anion gap: 13 (ref 5–15)
BUN/Creatinine Ratio: 17 (ref 10–24)
BUN: 15 mg/dL (ref 8–27)
BUN: 16 mg/dL (ref 8–23)
Bilirubin Total: 0.3 mg/dL (ref 0.0–1.2)
CO2: 24 mmol/L (ref 20–29)
CO2: 20 mmol/L — ABNORMAL LOW (ref 22–32)
Calcium: 9.3 mg/dL (ref 8.6–10.2)
Calcium: 9 mg/dL (ref 8.9–10.3)
Chloride: 103 mmol/L (ref 96–106)
Chloride: 104 mmol/L (ref 98–111)
Creatinine, Ser: 0.9 mg/dL (ref 0.76–1.27)
Creatinine, Ser: 1.49 mg/dL — ABNORMAL HIGH (ref 0.61–1.24)
GFR calc Af Amer: 95 mL/min/{1.73_m2} (ref >59)
GFR calc Af Amer: 52 mL/min — ABNORMAL LOW (ref >60)
GFR calc non Af Amer: 82 mL/min/{1.73_m2} (ref >59)
GFR calc non Af Amer: 45 mL/min — ABNORMAL LOW (ref >60)
Globulin, Total: 3.1 g/dL (ref 1.5–4.5)
Glucose, Bld: 144 mg/dL — ABNORMAL HIGH (ref 70–99)
Glucose: 129 mg/dL — ABNORMAL HIGH (ref 65–99)
Potassium: 4.3 mmol/L (ref 3.5–5.2)
Potassium: 4.4 mmol/L (ref 3.5–5.1)
Sodium: 138 mmol/L (ref 134–144)
Sodium: 137 mmol/L (ref 135–145)
Total Bilirubin: 1.4 mg/dL — ABNORMAL HIGH (ref 0.3–1.2)
Total Protein: 7.1 g/dL (ref 6.0–8.5)
Total Protein: 6.9 g/dL (ref 6.5–8.1)

## 2019-10-12 LAB — PROTIME-INR
INR: 1.3 — ABNORMAL HIGH (ref 0.8–1.2)
Prothrombin Time: 15.7 seconds — ABNORMAL HIGH (ref 11.4–15.2)

## 2019-10-12 LAB — POC SARS CORONAVIRUS 2 AG -  ED: SARS Coronavirus 2 Ag: NEGATIVE

## 2019-10-12 LAB — LACTIC ACID, PLASMA
Lactic Acid, Venous: 2.2 mmol/L (ref 0.5–1.9)
Lactic Acid, Venous: 1.3 mmol/L (ref 0.5–1.9)

## 2019-10-12 LAB — APTT: aPTT: 35 seconds (ref 24–36)

## 2019-10-12 LAB — TROPONIN I (HIGH SENSITIVITY)
Troponin I (High Sensitivity): 14 ng/L (ref <18)
Troponin I (High Sensitivity): 13 ng/L (ref <18)

## 2019-10-12 LAB — SARS CORONAVIRUS 2 (TAT 6-24 HRS): SARS Coronavirus 2: NEGATIVE

## 2019-10-12 MED ORDER — ONDANSETRON HCL 4 MG/2ML IJ SOLN: 4.0000 mg | Freq: Four times a day (QID) | INTRAMUSCULAR | Status: DC | PRN

## 2019-10-12 MED ORDER — SODIUM CHLORIDE 0.9 % IV BOLUS
1000.0000 mL | Freq: Once | INTRAVENOUS | Status: AC
  Administered 2019-10-12: 1000 mL via INTRAVENOUS

## 2019-10-12 MED ORDER — VANCOMYCIN HCL IN DEXTROSE 1-5 GM/200ML-% IV SOLN: 1000.0000 mg | INTRAVENOUS | Status: DC

## 2019-10-12 MED ORDER — ACETAMINOPHEN 500 MG PO TABS
1000.0000 mg | ORAL_TABLET | Freq: Once | ORAL | Status: AC
  Administered 2019-10-12: 1000 mg via ORAL
  Filled 2019-10-12: qty 2

## 2019-10-12 MED ORDER — SODIUM CHLORIDE 0.9 % IV SOLN: 2.0000 g | Freq: Two times a day (BID) | INTRAVENOUS | Status: DC

## 2019-10-12 MED ORDER — SODIUM CHLORIDE (HYPERTONIC) 2 % OP SOLN
1.0000 [drp] | Freq: Three times a day (TID) | OPHTHALMIC | Status: DC
  Administered 2019-10-12 – 2019-10-14 (×6): 1 [drp] via OPHTHALMIC
  Filled 2019-10-12 (×3): qty 15

## 2019-10-12 MED ORDER — BRIMONIDINE TARTRATE 0.2 % OP SOLN
1.0000 [drp] | Freq: Three times a day (TID) | OPHTHALMIC | Status: DC
  Administered 2019-10-12 – 2019-10-14 (×6): 1 [drp] via OPHTHALMIC
  Filled 2019-10-12 (×2): qty 5

## 2019-10-12 MED ORDER — TIMOLOL MALEATE 0.5 % OP SOLN
1.0000 [drp] | Freq: Two times a day (BID) | OPHTHALMIC | Status: DC
  Administered 2019-10-12 – 2019-10-14 (×5): 1 [drp] via OPHTHALMIC
  Filled 2019-10-12 (×2): qty 5

## 2019-10-12 MED ORDER — APIXABAN 5 MG PO TABS
5.0000 mg | ORAL_TABLET | Freq: Two times a day (BID) | ORAL | Status: DC
  Administered 2019-10-12 – 2019-10-14 (×4): 5 mg via ORAL
  Filled 2019-10-12 (×6): qty 1

## 2019-10-12 MED ORDER — VANCOMYCIN HCL 10 G IV SOLR
1500.0000 mg | Freq: Once | INTRAVENOUS | Status: AC
  Administered 2019-10-12: 1500 mg via INTRAVENOUS
  Filled 2019-10-12: qty 1500

## 2019-10-12 MED ORDER — SODIUM CHLORIDE (HYPERTONIC) 5 % OP OINT
1.0000 "application " | TOPICAL_OINTMENT | Freq: Every day | OPHTHALMIC | Status: DC
  Administered 2019-10-12 – 2019-10-14 (×2): 1 via OPHTHALMIC
  Filled 2019-10-12 (×2): qty 3.5

## 2019-10-12 MED ORDER — SODIUM CHLORIDE 0.9 % IV SOLN
INTRAVENOUS | Status: DC
  Administered 2019-10-12 – 2019-10-13 (×3): via INTRAVENOUS

## 2019-10-12 MED ORDER — VANCOMYCIN HCL IN DEXTROSE 1-5 GM/200ML-% IV SOLN: 1000.0000 mg | Freq: Once | INTRAVENOUS | Status: DC

## 2019-10-12 MED ORDER — ALBUTEROL SULFATE (2.5 MG/3ML) 0.083% IN NEBU: 2.5000 mg | INHALATION_SOLUTION | Freq: Four times a day (QID) | RESPIRATORY_TRACT | Status: DC | PRN

## 2019-10-12 MED ORDER — SIMVASTATIN 20 MG PO TABS
20.0000 mg | ORAL_TABLET | Freq: Every day | ORAL | Status: DC
  Administered 2019-10-12 – 2019-10-14 (×3): 20 mg via ORAL
  Filled 2019-10-12 (×3): qty 1

## 2019-10-12 MED ORDER — METRONIDAZOLE IN NACL 5-0.79 MG/ML-% IV SOLN
500.0000 mg | Freq: Once | INTRAVENOUS | Status: AC
  Administered 2019-10-12: 500 mg via INTRAVENOUS
  Filled 2019-10-12: qty 100

## 2019-10-12 MED ORDER — SODIUM CHLORIDE 0.9% FLUSH
3.0000 mL | Freq: Two times a day (BID) | INTRAVENOUS | Status: DC
  Administered 2019-10-12 – 2019-10-13 (×2): 3 mL via INTRAVENOUS

## 2019-10-12 MED ORDER — SODIUM CHLORIDE 0.9 % IV SOLN
2.0000 g | Freq: Once | INTRAVENOUS | Status: AC
  Administered 2019-10-12: 2 g via INTRAVENOUS
  Filled 2019-10-12: qty 2

## 2019-10-12 MED ORDER — LACTATED RINGERS IV BOLUS
1000.0000 mL | Freq: Once | INTRAVENOUS | Status: AC
  Administered 2019-10-12: 1000 mL via INTRAVENOUS

## 2019-10-12 MED ORDER — ACETAMINOPHEN 650 MG RE SUPP: 650.0000 mg | Freq: Four times a day (QID) | RECTAL | Status: DC | PRN

## 2019-10-12 MED ORDER — LISINOPRIL 20 MG PO TABS
20.0000 mg | ORAL_TABLET | Freq: Every day | ORAL | Status: DC
  Administered 2019-10-12 – 2019-10-14 (×3): 20 mg via ORAL
  Filled 2019-10-12 (×3): qty 1

## 2019-10-12 MED ORDER — ACETAMINOPHEN 325 MG PO TABS
650.0000 mg | ORAL_TABLET | Freq: Four times a day (QID) | ORAL | Status: DC | PRN
  Administered 2019-10-13: 650 mg via ORAL
  Filled 2019-10-12: qty 2

## 2019-10-12 MED ORDER — SODIUM CHLORIDE 0.9 % IV SOLN
2.0000 g | INTRAVENOUS | Status: DC
  Administered 2019-10-12 – 2019-10-13 (×2): 2 g via INTRAVENOUS
  Filled 2019-10-12: qty 20
  Filled 2019-10-12: qty 2
  Filled 2019-10-12: qty 20
  Filled 2019-10-12: qty 2

## 2019-10-12 MED ORDER — ONDANSETRON HCL 4 MG PO TABS: 4.0000 mg | ORAL_TABLET | Freq: Four times a day (QID) | ORAL | Status: DC | PRN

## 2019-10-12 NOTE — Plan of Care (Signed)
Patient able to demonstrate knowledge of plan of care using teachback.

## 2019-10-12 NOTE — ED Notes (Signed)
Pt was given bag lunch and water, okay per MD.

## 2019-10-12 NOTE — ED Notes (Signed)
Help get patient undress on the monitor patient is resting with call bell in reach 

## 2019-10-12 NOTE — ED Provider Notes (Signed)
[place] EMERGENCY DEPARTMENT Provider Note   CSN: [id number] Arrival date & time: 10/12/19  0857     History   Chief Complaint Chief Complaint  Patient presents with   Altered Mental Status   Atrial Fibrillation    HPI [name] is a 78 y.o. male.     78yo M w/ PMH including HTn, HLD, A fib on anticoagulation, recent hip replacement who p/w AMS. Wife reports he was in usual state of health yesterday. This morning she saw him at 7am and he was acting normally.  At 8 AM, she heard his alarm going off and went into check on him, finding him altered and confused.  He was having nonsensical speech.  He has not had any cough, vomiting, diarrhea, or known fevers at home.  He had left hip replacement in October and has been recovering appropriately, occasional pain in his hip but no changes recently.  No problems at the surgical site.  No sick contacts. Currently he states he is having some chest pains but cannot elaborate further. He denies SOB or nausea.   LEVEL 5 CAVEAT DUE TO AMS  The history is provided by the patient and the spouse.  Altered Mental Status Atrial Fibrillation    Past Medical History:  Diagnosis Date   Arthritis    Attention deficit disorder    Blind left eye    Diabetes mellitus without complication ([place])    Hyperlipidemia    Hypertension     Patient Active Problem List   Diagnosis Date Noted   PAC (premature atrial contraction)    Blocked premature atrial contraction    Acute urinary retention 09/14/2019   Protein-calorie malnutrition, severe ([place]) 04/28/2014   Acute blood loss anemia 04/28/2014   Septic joint ([place]) 04/25/2014   DVT of lower extremity, bilateral ([place]) 04/01/2014   Bacterial infection of knee joint ([place]) 03/29/2014   Essential hypertension 03/01/2014   Chest pain 03/01/2014   Diabetes mellitus type II, controlled ([place]) 03/01/2014   Hyperlipidemia 03/01/2014    Past Surgical  History:  Procedure Laterality Date   EYE SURGERY Left    x3   KNEE ARTHROSCOPY Bilateral 03/29/2014   Procedure: RIGHT KNEE ARTHROSCOPY AND DEBRIDEMENT, LEFT KNEE ASPIRATION,  LEFT KNEE ARTHROSCOPY AND DEBRIDEMENT.;  Surgeon: Meredith Pel, MD;  Location: [place];  Service: Orthopedics;  Laterality: Bilateral;  RIGHT KNEE ARTHROSCOPY AND DEBRIDEMENT, LEFT KNEE ASPIRATION,  LEFT KNEE ARTHROSCOPY AND DEBRIDEMENT.   KNEE ARTHROTOMY Bilateral 04/26/2014   Procedure: KNEE ARTHROTOMY, SYNOVECTOMY, ANTIBIOTIC BEAD PLACEMENT.;  Surgeon: Meredith Pel, MD;  Location: [place];  Service: Orthopedics;  Laterality: Bilateral;   lipoma removal, right upper back     TONSILLECTOMY     TOTAL HIP ARTHROPLASTY Left 09/14/2019   TOTAL HIP ARTHROPLASTY Left 09/14/2019   Procedure: LEFT TOTAL HIP ARTHROPLASTY ANTERIOR APPROACH;  Surgeon: Meredith Pel, MD;  Location: [place];  Service: Orthopedics;  Laterality: Left;        Home Medications    Prior to Admission medications   Medication Sig Start Date End Date Taking? Authorizing Provider  apixaban (ELIQUIS) 5 MG TABS tablet Take 1 tablet (5 mg total) by mouth 2 (two) times daily. 10/07/19 10/01/20  Daune Perch, NP  brimonidine (ALPHAGAN) 0.2 % ophthalmic solution Place 1 drop into the left eye 3 (three) times daily.  05/06/17   [provider]  lisinopril (PRINIVIL,ZESTRIL) 20 MG tablet Take 20 mg by mouth daily.    [provider]  metFORMIN (GLUCOPHAGE-XR) 500 MG 24 hr tablet Take 500 mg by mouth 2 (two) times daily. 09/21/19   [provider]  simvastatin (ZOCOR) 20 MG tablet Take 20 mg by mouth daily.    [provider]  sodium chloride (MURO 128) 2 % ophthalmic solution Place 1 drop into the left eye 3 (three) times daily.    [provider]  sodium chloride (MURO 128) 5 % ophthalmic ointment Place 1 application into the left eye at bedtime.    [provider]  timolol (TIMOPTIC) 0.5  % ophthalmic solution Place 1 drop into the left eye 2 (two) times daily.  05/12/17   [provider]    Family History Family History  Problem Relation Age of Onset   Heart attack Father    Heart attack Sister    Heart attack Brother     Social History Social History   Tobacco Use   Smoking status: Former Smoker   Smokeless tobacco: Never Used  Substance Use Topics   Alcohol use: Yes    Comment: occasional    Drug use: No     Allergies   Patient has no known allergies.   Review of Systems Review of Systems  Unable to perform ROS: Mental status change     Physical Exam Updated Vital Signs BP 127/90    Pulse 97    Temp (!) 102.3 F (39.1 C) (Oral)    Resp (!) 22    Ht 5\' 8"  (1.727 m)    Wt 77.1 kg    SpO2 98%    BMI 25.85 kg/m   Physical Exam Vitals signs and nursing note reviewed.  Constitutional:      Appearance: He is well-developed. He is ill-appearing. He is not toxic-appearing.     Comments: uncomfortable  HENT:     Head: Normocephalic and atraumatic.     Mouth/Throat:     Mouth: Mucous membranes are moist.     Pharynx: Oropharynx is clear.  Eyes:     Conjunctiva/sclera: Conjunctivae normal.     Pupils: Pupils are equal, round, and reactive to light.  Neck:     Musculoskeletal: Neck supple.  Cardiovascular:     Rate and Rhythm: Regular rhythm. Tachycardia present.     Heart sounds: Normal heart sounds. No murmur.  Pulmonary:     Effort: Pulmonary effort is normal.     Breath sounds: Normal breath sounds.  Abdominal:     General: Bowel sounds are normal. There is no distension.     Palpations: Abdomen is soft.     Tenderness: There is no abdominal tenderness.  Musculoskeletal:     Right lower leg: No edema.     Left lower leg: No edema.  Skin:    General: Skin is warm and dry.     Comments: Healed incision site on left hip with no drainage, surrounding erythema, edema, or focal tenderness  Neurological:     Mental Status: He  is alert.     Comments: Mildly disoriented but fluent speech, following basic commands, able to move all 4 extremities equally, no facial asymmetry      ED Treatments / Results  Labs (all labs ordered are listed, but only abnormal results are displayed) Labs Reviewed  LACTIC ACID, PLASMA - Abnormal; Notable for the following components:      Result Value   Lactic Acid, Venous 2.2 (*)    All other components within normal limits  COMPREHENSIVE METABOLIC PANEL -  Abnormal; Notable for the following components:   CO2 20 (*)    Glucose, Bld 144 (*)    Creatinine, Ser 1.49 (*)    Albumin 3.2 (*)    Total Bilirubin 1.4 (*)    GFR calc non Af Amer 45 (*)    GFR calc Af Amer 52 (*)    All other components within normal limits  CBC WITH DIFFERENTIAL/PLATELET - Abnormal; Notable for the following components:   WBC 16.7 (*)    Hemoglobin 12.5 (*)    Neutro Abs 15.7 (*)    Lymphs Abs 0.5 (*)    Abs Immature Granulocytes 0.12 (*)    All other components within normal limits  PROTIME-INR - Abnormal; Notable for the following components:   Prothrombin Time 15.7 (*)    INR 1.3 (*)    All other components within normal limits  URINALYSIS, ROUTINE W REFLEX MICROSCOPIC - Abnormal; Notable for the following components:   APPearance HAZY (*)    Hgb urine dipstick SMALL (*)    Protein, ur 30 (*)    Nitrite POSITIVE (*)    Leukocytes,Ua TRACE (*)    Bacteria, UA RARE (*)    All other components within normal limits  SARS CORONAVIRUS 2 (TAT 6-24 HRS)  CULTURE, BLOOD (ROUTINE X 2)  CULTURE, BLOOD (ROUTINE X 2)  URINE CULTURE  LACTIC ACID, PLASMA  APTT  POC SARS CORONAVIRUS 2 AG -  ED  TROPONIN I (HIGH SENSITIVITY)  TROPONIN I (HIGH SENSITIVITY)    EKG None  Radiology Dg Chest Port 1 View  Result Date: 10/12/2019 CLINICAL DATA:  Fever, altered mental status EXAM: PORTABLE CHEST 1 VIEW COMPARISON:  2015 FINDINGS: The heart size and mediastinal contours are within normal limits. Both  lungs are clear. No pleural effusion. No acute osseous abnormality. IMPRESSION: No acute process in the chest. Electronically Signed   By: Macy Mis M.D.   On: 10/12/2019 10:09    Procedures .Critical Care Performed by: Sharlett Iles, MD Authorized by: Sharlett Iles, MD   Critical care provider statement:    Critical care time (minutes):  35   Critical care time was exclusive of:  Separately billable procedures and treating other patients   Critical care was necessary to treat or prevent imminent or life-threatening deterioration of the following conditions:  Sepsis   Critical care was time spent personally by me on the following activities:  Ordering and performing treatments and interventions, ordering and review of laboratory studies, ordering and review of radiographic studies, re-evaluation of patient's condition, development of treatment plan with patient or surrogate, evaluation of patient's response to treatment, examination of patient and obtaining history from patient or surrogate   (including critical care time)  Medications Ordered in ED Medications  ceFEPIme (MAXIPIME) 2 g in sodium chloride 0.9 % 100 mL IVPB (2 g Intravenous New Bag/Given 10/12/19 0945)  metroNIDAZOLE (FLAGYL) IVPB 500 mg (500 mg Intravenous New Bag/Given 10/12/19 0946)  vancomycin (VANCOCIN) 1,500 mg in sodium chloride 0.9 % 500 mL IVPB (1,500 mg Intravenous New Bag/Given 10/12/19 0955)  lactated ringers bolus 1,000 mL (1,000 mLs Intravenous New Bag/Given 10/12/19 0942)  acetaminophen (TYLENOL) tablet 1,000 mg (1,000 mg Oral Given 10/12/19 0942)     Initial Impression / Assessment and Plan / ED Course  I have reviewed the triage vital signs and the nursing notes.  Pertinent labs & imaging results that were available during my care of the patient were reviewed by me and considered in my  medical decision making (see chart for details).       Ill-appearing but nontoxic on exam, fever  of 102.3, mild tachycardia and tachypnea.  Normal O2 saturation on room air and stable blood pressure.  He had no focal areas of tenderness.  Based on abnormal vital signs, activated code sepsis with blood and urine cultures, broad-spectrum antibiotics for unknown source.  Also gave fluid bolus and Tylenol.  Rapid Covid antigen is negative, PCR sent.  Lab work shows creatinine of 1.49, WBC 16.7.  Chest x-ray negative.  Surgical site appears well-healed with no findings suggestive of infection. UA nitrite positive c/w infection. Repeat lactate normalized.  I suspect his fever and altered mental status may be due to UTI.  I discussed admission with [facility] hospitalist, Dr. Tamala Julian, and pt admitted for further care.  [name] was evaluated in Emergency Department on 10/12/2019 for the symptoms described in the history of present illness. He was evaluated in the context of the global COVID-19 pandemic, which necessitated consideration that the patient might be at risk for infection with the SARS-CoV-2 virus that causes COVID-19. Institutional protocols and algorithms that pertain to the evaluation of patients at risk for COVID-19 are in a state of rapid change based on information released by regulatory bodies including the CDC and federal and state organizations. These policies and algorithms were followed during the patient's care in the ED.  Final Clinical Impressions(s) / ED Diagnoses   Final diagnoses:  Sepsis, due to unspecified organism, unspecified whether acute organ dysfunction present [facility])  Urinary tract infection without hematuria, site unspecified  Altered mental status, unspecified altered mental status type    ED Discharge Orders    None       Eriyanna Kofoed, Wenda [place], MD 10/12/19 [phone]

## 2019-10-12 NOTE — ED Triage Notes (Addendum)
Pt here from home for ams this morning onset 0800. Pt's wife saw him normal at 0700 and then when he wasn't turning off his alarm at 0800 she went in to find him confused, not answering questions appropriately. EMS reports expressive aphasia/unresponsiveness with hypotension on their arrival. Pt 300 NS bolus in route. Recent hip surgery here, dx with afib at that time. Febrile, 102.3, in triage.

## 2019-10-12 NOTE — Progress Notes (Signed)
Pharmacy Antibiotic Note  [name] is a 78 y.o. male admitted on 10/12/2019 with sepsis.  Pharmacy has been consulted for vancomycin and cefepime dosing. Pt is febrile with Tmax 102.3 and WBC is elevated at 16.7. SCr is elevated at 1.49. Baseline is ~1.    Plan: Vancomycin 1500mg  IV x 1 then 1g IV Q24H Cefepime 2gm IV Q12H F/u renal fxn, C&S, clinical status peak/trough at SS  Height: 5\' 8"  (172.7 cm) Weight: 170 lb (77.1 kg) IBW/kg (Calculated) : 68.4  Temp (24hrs), Avg:102.3 F (39.1 C), Min:102.3 F (39.1 C), Max:102.3 F (39.1 C)  Recent Labs  Lab 10/11/19 1206  WBC 8.3  CREATININE 0.90    Estimated Creatinine Clearance: 66.5 mL/min (by C-G formula based on SCr of 0.9 mg/dL).    No Known Allergies  Antimicrobials this admission: Vanc 11/24>> Cefepime 11/24> Flagyl x x 11/24  Dose adjustments this admission: N/A  Microbiology results: Pending  Thank you for allowing pharmacy to be a part of this patient's care.  Oluwatomisin Hustead, Rande Lawman 10/12/2019 9:32 AM

## 2019-10-12 NOTE — H&P (Signed)
History and Physical    [name] [id number] DOB: 01/21/1941 DOA: 10/12/2019  Referring MD/NP/PA: Theotis Burrow, MD PCP: Kathyrn Lass, MD  Patient coming from: Home via EMS  Chief Complaint: Altered Mental status  I have personally briefly reviewed patient's old medical records in [place]   HPI: [name] is a 78 y.o. 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  History is obtained from the patient's wife as he has does not recall events in question.  Apparently his alarm went off this morning which normally reminds him to take his meds.  However his alarm stayed on it so she went to go check on him and found him staring at the ceiling and babbling.  She called her daughter who immediately called 911.  Over the last week he had been having increased urinary frequency.  His wife notes that he has a Holter monitor on as being monitored by cardiology.  Patient had heart block in the OR during his hip replacement on the 27th with Dr. Marlou Sa.  Outpatient monitor detected paroxysmal atrial fibrillation.  He had been switched from Xarelto to Eliquis almost 1 week ago.  Denies any redness or swelling near the incision site.  ED Course: Upon admission into the emergency department patient was seen to be febrile up to 102.3 F, heart rates up to 150, respiration of 30, and blood pressure as low as 96/52.  CT scan of the brain did not show any acute abnormalities.  Labs significant for WBC 16.7, hemoglobin 12.5, BUN 16, creatinine 1.49, lactic acid chest x-ray was otherwise noted to be clear.  Point-of-care testing for Covid was negative.  Urinalysis was positive for signs of infection.  Sepsis protocol was initiated.  Patient received a bolus of 2 L of IV fluids, vancomycin, Rocephin, and cefepime.  Blood pressures were  noted to be stable after initial fluid bolus.  Review of Systems  Constitutional: Positive for fever and malaise/fatigue.  HENT: Negative for nosebleeds and sinus pain.   Eyes: Negative for pain.  Respiratory: Negative for cough and shortness of breath.   Cardiovascular: Negative for chest pain and leg swelling.  Gastrointestinal: Positive for abdominal pain. Negative for blood in stool, diarrhea, nausea and vomiting.  Genitourinary: Positive for frequency.  Musculoskeletal: Negative for back pain and myalgias.  Neurological: Positive for speech change. Negative for focal weakness.  Psychiatric/Behavioral: Positive for memory loss.    Past Medical History:  Diagnosis Date  . Arthritis   . Attention deficit disorder   . Blind left eye   . Diabetes mellitus without complication ([place])   . Hyperlipidemia   . Hypertension     Past Surgical History:  Procedure Laterality Date  . EYE SURGERY Left    x3  . KNEE ARTHROSCOPY Bilateral 03/29/2014   Procedure: RIGHT KNEE ARTHROSCOPY AND DEBRIDEMENT, LEFT KNEE ASPIRATION,  LEFT KNEE ARTHROSCOPY AND DEBRIDEMENT.;  Surgeon: Meredith Pel, MD;  Location: [place];  Service: Orthopedics;  Laterality: Bilateral;  RIGHT KNEE ARTHROSCOPY AND DEBRIDEMENT, LEFT KNEE ASPIRATION,  LEFT KNEE ARTHROSCOPY AND DEBRIDEMENT.  Marland Kitchen KNEE ARTHROTOMY Bilateral 04/26/2014   Procedure: KNEE ARTHROTOMY, SYNOVECTOMY, ANTIBIOTIC BEAD PLACEMENT.;  Surgeon: Meredith Pel, MD;  Location: [place];  Service: Orthopedics;  Laterality: Bilateral;  . lipoma removal, right upper back    . TONSILLECTOMY    . TOTAL  HIP ARTHROPLASTY Left 09/14/2019  . TOTAL HIP ARTHROPLASTY Left 09/14/2019   Procedure: LEFT TOTAL HIP ARTHROPLASTY ANTERIOR APPROACH;  Surgeon: Meredith Pel, MD;  Location: [place];  Service: Orthopedics;  Laterality: Left;     reports that he has quit smoking. He has never used smokeless tobacco. He reports current alcohol use. He reports that he does not use  drugs.  No Known Allergies  Family History  Problem Relation Age of Onset  . Heart attack Father   . Heart attack Sister   . Heart attack Brother     Prior to Admission medications   Medication Sig Start Date End Date Taking? Authorizing Provider  apixaban (ELIQUIS) 5 MG TABS tablet Take 1 tablet (5 mg total) by mouth 2 (two) times daily. 10/07/19 10/01/20  Daune Perch, NP  brimonidine (ALPHAGAN) 0.2 % ophthalmic solution Place 1 drop into the left eye 3 (three) times daily.  05/06/17   [provider]  lisinopril (PRINIVIL,ZESTRIL) 20 MG tablet Take 20 mg by mouth daily.    [provider]  metFORMIN (GLUCOPHAGE-XR) 500 MG 24 hr tablet Take 500 mg by mouth 2 (two) times daily. 09/21/19   [provider]  simvastatin (ZOCOR) 20 MG tablet Take 20 mg by mouth daily.    [provider]  sodium chloride (MURO 128) 2 % ophthalmic solution Place 1 drop into the left eye 3 (three) times daily.    [provider]  sodium chloride (MURO 128) 5 % ophthalmic ointment Place 1 application into the left eye at bedtime.    [provider]  timolol (TIMOPTIC) 0.5 % ophthalmic solution Place 1 drop into the left eye 2 (two) times daily.  05/12/17   [provider]    Physical Exam:  Constitutional: Elderly male who appears to be in no acute distress at this time Vitals:   10/12/19 1122 10/12/19 1130 10/12/19 1145 10/12/19 1330  BP:  106/65 (!) 96/52 (!) 97/55  Pulse:  99 98 85  Resp:  20 (!) 23 17  Temp: 99.8 F (37.7 C)     TempSrc: Oral     SpO2:  98% 97% 98%  Weight:      Height:       Eyes: Opacification of the left eye.  Right eye pupil reactive to light. ENMT: Mucous membranes are moist. Posterior pharynx clear of any exudate or lesions.   Neck: normal, supple, no masses, no thyromegaly Respiratory: clear to auscultation bilaterally, no wheezing, no crackles. Normal respiratory effort. No accessory muscle use.   Cardiovascular: Regular rate and rhythm, no murmurs / rubs / gallops. No extremity edema. 2+ pedal pulses. No carotid bruits.  Abdomen: Lower abdominal tenderness appreciated, no CVA tenderness appreciated. No hepatosplenomegaly. Bowel sounds positive.  Musculoskeletal: no clubbing / cyanosis. No joint deformity upper and lower extremities. Good ROM, no contractures. Normal muscle tone.  Skin: no rashes, lesions, ulcers. No induration Neurologic: CN 2-12 grossly intact. Sensation intact, DTR normal. Strength 5/5 in all 4.  Psychiatric: Normal judgment and insight. Alert and oriented x 3. Normal mood.     Labs on Admission: I have personally reviewed following labs and imaging studies  CBC: Recent Labs  Lab 10/11/19 1206 10/12/19 0925  WBC 8.3 16.7*  NEUTROABS  --  15.7*  HGB 12.1* 12.5*  HCT 36.1* 39.0  MCV 87 90.7  PLT 409 [id number]   Basic Metabolic Panel: Recent Labs  Lab 10/11/19 1206 10/12/19 0925  NA 138 137  K  4.3 4.4  CL 103 104  CO2 24 20*  GLUCOSE 129* 144*  BUN 15 16  CREATININE 0.90 1.49*  CALCIUM 9.3 9.0   GFR: Estimated Creatinine Clearance: 40.2 mL/min (A) (by C-G formula based on SCr of 1.49 mg/dL (H)). Liver Function Tests: Recent Labs  Lab 10/11/19 1206 10/12/19 0925  AST 13 21  ALT 10 13  ALKPHOS 78 70  BILITOT 0.3 1.4*  PROT 7.1 6.9  ALBUMIN 4.0 3.2*   No results for input(s): LIPASE, AMYLASE in the last 168 hours. No results for input(s): AMMONIA in the last 168 hours. Coagulation Profile: Recent Labs  Lab 10/12/19 0925  INR 1.3*   Cardiac Enzymes: No results for input(s): CKTOTAL, CKMB, CKMBINDEX, TROPONINI in the last 168 hours. BNP (last 3 results) No results for input(s): PROBNP in the last 8760 hours. HbA1C: No results for input(s): HGBA1C in the last 72 hours. CBG: No results for input(s): GLUCAP in the last 168 hours. Lipid Profile: No results for input(s): CHOL, HDL, LDLCALC, TRIG, CHOLHDL, LDLDIRECT in the last 72 hours.  Thyroid Function Tests: No results for input(s): TSH, T4TOTAL, FREET4, T3FREE, THYROIDAB in the last 72 hours. Anemia Panel: No results for input(s): VITAMINB12, FOLATE, FERRITIN, TIBC, IRON, RETICCTPCT in the last 72 hours. Urine analysis:    Component Value Date/Time   COLORURINE YELLOW 10/12/2019 1249   APPEARANCEUR HAZY (A) 10/12/2019 1249   LABSPEC 1.014 10/12/2019 1249   PHURINE 6.0 10/12/2019 1249   GLUCOSEU NEGATIVE 10/12/2019 1249   HGBUR SMALL (A) 10/12/2019 1249   BILIRUBINUR NEGATIVE 10/12/2019 1249   KETONESUR NEGATIVE 10/12/2019 1249   PROTEINUR 30 (A) 10/12/2019 1249   UROBILINOGEN 0.2 04/29/2014 1111   NITRITE POSITIVE (A) 10/12/2019 1249   LEUKOCYTESUR TRACE (A) 10/12/2019 1249   Sepsis Labs: No results found for this or any previous visit (from the past 240 hour(s)).   Radiological Exams on Admission: Ct Head Wo Contrast  Result Date: 10/12/2019 CLINICAL DATA:  Unsure why he is hereAltered level of consciousness (LOC), unexplained AMS, fever EXAM: CT HEAD WITHOUT CONTRAST TECHNIQUE: Contiguous axial images were obtained from the base of the skull through the vertex without intravenous contrast. COMPARISON:  None. FINDINGS: Brain: No acute intracranial hemorrhage. No focal mass lesion. No CT evidence of acute infarction. No midline shift or mass effect. No hydrocephalus. Basilar cisterns are patent. There are periventricular and subcortical white matter hypodensities. Generalized cortical atrophy. Vascular: No hyperdense vessel or unexpected calcification. Skull: Normal. Negative for fracture or focal lesion. Sinuses/Orbits: Paranasal sinuses and mastoid air cells are clear. Orbits are clear. Other: None. IMPRESSION: 1. No acute intracranial findings. 2. Atrophy and white matter microvascular disease. Electronically Signed   By: Suzy Bouchard M.D.   On: 10/12/2019 14:18   Dg Chest Port 1 View  Result Date: 10/12/2019 CLINICAL DATA:  Fever, altered mental status  EXAM: PORTABLE CHEST 1 VIEW COMPARISON:  2015 FINDINGS: The heart size and mediastinal contours are within normal limits. Both lungs are clear. No pleural effusion. No acute osseous abnormality. IMPRESSION: No acute process in the chest. Electronically Signed   By: Macy Mis M.D.   On: 10/12/2019 10:09    EKG: Independently reviewed.  Sinus tachycardia 120 bpm  Assessment/Plan Sepsis secondary to urinary tract infection: Patient presents with fever up to 102.3 F with tachycardia and tachypnea.  WBC elevated at 16.7 with initial lactic acid 2.2->1.3.  Urinalysis was positive for nitrites, rare bacteria and 6-10 WBCs.  Chest x-ray was otherwise clear  and did not show any acute abnormalities.  Suspecting sepsis from urinary tract infection. -Admit to a medical telemetry bed -Follow-up blood and urine culture -De-escalate antibiotics to Rocephin -Tylenol as needed for fever -Recheck CBC in a.m.  Acute metabolic encephalopathy: Patient presented after being found acutely altered by his wife this morning.  Speech is back to normal.  CT scan of the brain did not show any acute signs of a stroke.  Suspect symptoms likely secondary to underlying infection. -Neurochecks  Acute kidney injury: On admission patient found to have elevated creatinine of 1.49 with BUN 16.  His baseline creatinine was 0.9. -Check renal ultrasound -Normal saline IV fluids at 75 mL/h -Recheck BMP in a.m.  Left hip replacement: Left hip replacement on 10/27 by Dr. Marlou Sa. -Continue outpatient follow-up with orthopedic  Paroxysmal atrial fibrillation: Patient on anticoagulation of Eliquis.  Enfolding outpatient setting by cardiology and currently has a Holter monitor in place. -Continue Eliquis -Continue outpatient follow-up  Normocytic anemia: Hemoglobin 12.5 which appears near his baseline. -Continue to monitor  Diabetes mellitus type 2: Patient diet controlled.  Last hemoglobin A1c 6.6 on 09/07/2019. -Continue carb  modified diet  Blind in left eye: Stable  Hyperlipidemia: -Continue simvastatin  DVT prophylaxis: Eliquis Code Status: Full Family Communication: Discussed plan of care with the patient and his wife Disposition Plan: Likely discharge home once medically stable Consults called: None Admission status: Improved  Norval Morton MD [facility] Pager [phone]   If 7PM-7AM, please contact night-coverage [email or web address] Password [email or web address]  10/12/2019, 2:35 PM

## 2019-10-13 DIAGNOSIS — N390 Urinary tract infection, site not specified: Secondary | ICD-10-CM

## 2019-10-13 DIAGNOSIS — A419 Sepsis, unspecified organism: Principal | ICD-10-CM

## 2019-10-13 LAB — BASIC METABOLIC PANEL
Anion gap: 9 (ref 5–15)
BUN: 13 mg/dL (ref 8–23)
CO2: 22 mmol/L (ref 22–32)
Calcium: 8 mg/dL — ABNORMAL LOW (ref 8.9–10.3)
Chloride: 106 mmol/L (ref 98–111)
Creatinine, Ser: 1.03 mg/dL (ref 0.61–1.24)
GFR calc Af Amer: >60 mL/min (ref >60)
GFR calc non Af Amer: >60 mL/min (ref >60)
Glucose, Bld: 128 mg/dL — ABNORMAL HIGH (ref 70–99)
Potassium: 3.5 mmol/L (ref 3.5–5.1)
Sodium: 137 mmol/L (ref 135–145)

## 2019-10-13 LAB — CBC
HCT: 32.1 % — ABNORMAL LOW (ref 39.0–52.0)
Hemoglobin: 10.3 g/dL — ABNORMAL LOW (ref 13.0–17.0)
MCH: 28.7 pg (ref 26.0–34.0)
MCHC: 32.1 g/dL (ref 30.0–36.0)
MCV: 89.4 fL (ref 80.0–100.0)
Platelets: 264 10*3/uL (ref 150–400)
RBC: 3.59 MIL/uL — ABNORMAL LOW (ref 4.22–5.81)
RDW: 13.4 % (ref 11.5–15.5)
WBC: 15.7 10*3/uL — ABNORMAL HIGH (ref 4.0–10.5)
nRBC: 0 % (ref 0.0–0.2)

## 2019-10-13 LAB — BLOOD CULTURE ID PANEL (REFLEXED)

## 2019-10-13 LAB — URINE CULTURE: Culture: NO GROWTH

## 2019-10-13 NOTE — Progress Notes (Signed)
NEW ADMISSION NOTE New Admission Note:   Arrival Method: wheelchair from [facility] Mental Orientation: alert and oriented x3 Telemetry: NSR box: 11 Assessment: Completed Skin: intact and dry  IV: LH and LAC Pain: 0 Safety Measures: Safety Fall Prevention Plan has been given, discussed and signed Admission: Completed [facility] Orientation: Patient has been orientated to the room, unit and staff.  Family: wife at bedside   Orders have been reviewed and implemented. Will continue to monitor the patient. Call light has been placed within reach and bed alarm has been activated.   Baldo Ash, RN

## 2019-10-13 NOTE — Plan of Care (Signed)
  Problem: Clinical Measurements: Goal: Ability to maintain clinical measurements within normal limits will improve Outcome: Progressing   

## 2019-10-13 NOTE — Progress Notes (Addendum)
PHARMACY - PHYSICIAN COMMUNICATION CRITICAL VALUE ALERT - BLOOD CULTURE IDENTIFICATION (BCID)  [name] is an 78 y.o. adult who presented to [facility] on 10/12/2019 with a chief complaint of AMS/urosepsis  Assessment:   1/2 blood cultures growing Klebsiella pneumoniae  Name of physician (or Provider) Contacted:  B Kyere  Current antibiotics: Rocephin 2 g IV q24h  Changes to prescribed antibiotics recommended:  None--continue Rocephin as ordered  Results for orders placed or performed during the hospital encounter of 10/12/19  Blood Culture ID Panel (Reflexed) (Collected: 10/12/2019  9:25 AM)  Result Value Ref Range   Enterococcus species NOT DETECTED NOT DETECTED   Listeria monocytogenes NOT DETECTED NOT DETECTED   Staphylococcus species NOT DETECTED NOT DETECTED   Staphylococcus aureus (BCID) NOT DETECTED NOT DETECTED   Streptococcus species NOT DETECTED NOT DETECTED   Streptococcus agalactiae NOT DETECTED NOT DETECTED   Streptococcus pneumoniae NOT DETECTED NOT DETECTED   Streptococcus pyogenes NOT DETECTED NOT DETECTED   Acinetobacter baumannii NOT DETECTED NOT DETECTED   Enterobacteriaceae species DETECTED (A) NOT DETECTED   Enterobacter cloacae complex NOT DETECTED NOT DETECTED   Escherichia coli NOT DETECTED NOT DETECTED   Klebsiella oxytoca NOT DETECTED NOT DETECTED   Klebsiella pneumoniae DETECTED (A) NOT DETECTED   Proteus species NOT DETECTED NOT DETECTED   Serratia marcescens NOT DETECTED NOT DETECTED   Carbapenem resistance NOT DETECTED NOT DETECTED   Haemophilus influenzae NOT DETECTED NOT DETECTED   Neisseria meningitidis NOT DETECTED NOT DETECTED   Pseudomonas aeruginosa NOT DETECTED NOT DETECTED   Candida albicans NOT DETECTED NOT DETECTED   Candida glabrata NOT DETECTED NOT DETECTED   Candida krusei NOT DETECTED NOT DETECTED   Candida parapsilosis NOT DETECTED NOT DETECTED   Candida tropicalis NOT DETECTED NOT DETECTED    [name] 10/13/2019  12:36 AM

## 2019-10-13 NOTE — Plan of Care (Signed)

## 2019-10-13 NOTE — Progress Notes (Signed)
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, bilateral lower extremity DVTs 2015, DM TY 2, HTN, complete heart block and given outpatient heart monitor showing PAF given Zio patch started on Eliquis Patient was found talking nonsensically at home-febrile up to 102.3 heart rate 150 blood pressure 90s over 50s CT head negative WBC 16 UA sepsis was positive  Assessment & Plan: Secondary to Klebsiella urinary infection-continue right now Ceftriaxone, saline 75 cc/h await cultures and narrow as appropriate in the next several days PAF Zio patch Eliquis [place] score >3-no current rate control at this time-monitor trends HTN-continue lisinopril 20 daily Prior bilateral lower extremity DVTs 2015-dual indication for Eliquis as above DM TY 2 CBGs ranging 120s to 140s not on any prior to admission meds HTN-lisinopril as above 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 will need outpatient characterization Blind left eye Prior septic arthritis both knees  Subjective: Awake alert somewhat coherent did not eat breakfast according to nursing sitting up in the chair did not seem to need much assistance Consultants:   None Procedures:   None Antimicrobials:   Ceftriaxone Data Reviewed:  Labs BUN/creatinine 13/1.0 White count 15 down from 16.7 Radiology Studies: Renal ultrasound 11/24 showing 2.1 and 1.3 cm cysts   Objective: Vitals:   10/12/19 1615 10/12/19 1730 10/13/19 0029 10/13/19 0444  BP: 106/67 120/73 (!) 98/51 (!) 106/54  Pulse: 82 78 76 80  Resp: 17 18 16 15   Temp:  97.8 F (36.6 C) 98.1 F (36.7 C) 98.2 F (36.8 C)  TempSrc:  Oral Oral Oral  SpO2: 100% 100% 98% 98%  Weight:      Height:        Intake/Output Summary (Last 24  hours) at 10/13/2019 0735 Last data filed at 10/13/2019 0300 Gross per 24 hour  Intake 3673.3 ml  Output 1225 ml  Net 2448.3 ml   Filed Weights   10/12/19 0930  Weight: 77.1 kg    Examination: Awake alert coherent no thyromegaly throat clear no added sound chest abdomen soft nontender no rebound no guarding no lower extremity edema ROM intact CTA B no rash cloudy right eye  Scheduled Meds: . apixaban  5 mg Oral BID  . brimonidine  1 drop Left Eye TID  . lisinopril  20 mg Oral Daily  . simvastatin  20 mg Oral Daily  . sodium chloride  1 drop Left Eye TID  . sodium chloride  1 application Left Eye QHS  . sodium chloride flush  3 mL Intravenous Q12H  . timolol  1 drop Left Eye BID   Continuous Infusions: . sodium chloride 75 mL/hr at 10/13/19 0536  . cefTRIAXone (ROCEPHIN)  IV 2 g (10/12/19 2110)     LOS: 1 day   Time spent: [place], MD [facility]  10/13/2019, 7:35 AM

## 2019-10-14 LAB — CBC WITH DIFFERENTIAL/PLATELET
Abs Immature Granulocytes: 0.07 10*3/uL (ref 0.00–0.07)
Basophils Absolute: 0 10*3/uL (ref 0.0–0.1)
Basophils Relative: 0 %
Eosinophils Absolute: 0.1 10*3/uL (ref 0.0–0.5)
Eosinophils Relative: 1 %
HCT: 31.4 % — ABNORMAL LOW (ref 39.0–52.0)
Hemoglobin: 10 g/dL — ABNORMAL LOW (ref 13.0–17.0)
Immature Granulocytes: 1 %
Lymphocytes Relative: 13 %
Lymphs Abs: 1.6 10*3/uL (ref 0.7–4.0)
MCH: 28.7 pg (ref 26.0–34.0)
MCHC: 31.8 g/dL (ref 30.0–36.0)
MCV: 90 fL (ref 80.0–100.0)
Monocytes Absolute: 1.5 10*3/uL — ABNORMAL HIGH (ref 0.1–1.0)
Monocytes Relative: 12 %
Neutro Abs: 9.5 10*3/uL — ABNORMAL HIGH (ref 1.7–7.7)
Neutrophils Relative %: 73 %
Platelets: 243 10*3/uL (ref 150–400)
RBC: 3.49 MIL/uL — ABNORMAL LOW (ref 4.22–5.81)
RDW: 13.7 % (ref 11.5–15.5)
WBC: 12.8 10*3/uL — ABNORMAL HIGH (ref 4.0–10.5)
nRBC: 0 % (ref 0.0–0.2)

## 2019-10-14 LAB — RENAL FUNCTION PANEL
Albumin: 2.3 g/dL — ABNORMAL LOW (ref 3.5–5.0)
Anion gap: 9 (ref 5–15)
BUN: 14 mg/dL (ref 8–23)
CO2: 23 mmol/L (ref 22–32)
Calcium: 7.8 mg/dL — ABNORMAL LOW (ref 8.9–10.3)
Chloride: 106 mmol/L (ref 98–111)
Creatinine, Ser: 0.98 mg/dL (ref 0.61–1.24)
GFR calc Af Amer: >60 mL/min (ref >60)
GFR calc non Af Amer: >60 mL/min (ref >60)
Glucose, Bld: 118 mg/dL — ABNORMAL HIGH (ref 70–99)
Phosphorus: 1.9 mg/dL — ABNORMAL LOW (ref 2.5–4.6)
Potassium: 3.3 mmol/L — ABNORMAL LOW (ref 3.5–5.1)
Sodium: 138 mmol/L (ref 135–145)

## 2019-10-14 LAB — CULTURE, BLOOD (ROUTINE X 2): Special Requests: ADEQUATE

## 2019-10-14 MED ORDER — SULFAMETHOXAZOLE-TRIMETHOPRIM 800-160 MG PO TABS: 1.0000 | ORAL_TABLET | Freq: Two times a day (BID) | ORAL | 0 refills | Status: DC

## 2019-10-14 MED ORDER — SULFAMETHOXAZOLE-TRIMETHOPRIM 800-160 MG PO TABS
1.0000 | ORAL_TABLET | Freq: Two times a day (BID) | ORAL | Status: DC
  Administered 2019-10-14: 1 via ORAL
  Filled 2019-10-14: qty 1

## 2019-10-14 NOTE — Progress Notes (Signed)
[name] to be discharged home per MD order. Discussed prescriptions and follow up appointments with the patient. Prescriptions given to patient; medication list explained in detail. Patient verbalized understanding.  Skin clean, dry and intact without evidence of skin break down, no evidence of skin tears noted. IV catheter discontinued intact. Site without signs and symptoms of complications. Dressing and pressure applied. Pt denies pain at the site currently. No complaints noted.  Patient free of lines, drains, and wounds.   An After Visit Summary (AVS) was printed and given to the patient. Patient escorted via wheelchair, and discharged home via private auto.  Baldo Ash, RN

## 2019-10-14 NOTE — Discharge Summary (Signed)
Physician Discharge Summary  [name] [id number] DOB: 20-Jan-1941 DOA: 10/12/2019  PCP: Kathyrn Lass, MD  Admit date: 10/12/2019 Discharge date: 10/14/2019  Time spent: 25 minutes  Recommendations for Outpatient Follow-up:  1. Complete Bactrim course for Klebsiella pneumonia UTI 2. Needs edema, CBC 1 week 3. Needs outpatient monitoring 4. Continue Zio patch monitoring in the outpatient and follow-up with cardiology  5. 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/2019 at that admission had urinary retention and urology was consulted 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 2015, bilateral lower extremity DVTs 2015, DM TY 2, HTN, complete heart block and given outpatient heart monitor showing PAF given Zio patch started on Eliquis Patient was found talking nonsensically at home-febrile up to 102.3 heart rate 150 blood pressure 90s over 50s CT head negative WBC 16 UA sepsis was positive   Hospital Course:  Secondary to Klebsiella urinary infection-c based on ceftriaxone and saline and transition to Bactrim which bacteria was sensitive to complete 3 more days treatment patient stabilized at this time PAF Zio patch Eliquis [place] score >3-no current rate control at this time-monitor trends HTN-continue lisinopril 20 daily Prior bilateral lower extremity DVTs 2015-dual indication for Eliquis as above DM TY 2 CBGs ranging 120s to 140s not on any prior to admission  meds HTN-lisinopril as above 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 will need outpatient characterization Blind left eye Prior septic arthritis both knees  Procedures: Kidney ultrasound Consultations:  None  Discharge Exam: Vitals:   10/14/19 0447 10/14/19 0759  BP: 132/76 (!) 187/75  Pulse: 83 97  Resp: 18 18  Temp: 98.3 F (36.8 C) (!) 97.5 F (36.4 C)  SpO2: 97% 98%    General: Awake alert coherent no distress EOMI NCAT no focal deficit Cardiovascular: S1-S2 no murmur rub or gallop Respiratory: Clinically clear no added sound no rales no rhonchi Abdomen soft nontender no rebound no guarding  Discharge Instructions   Discharge Instructions    Diet - low sodium heart healthy   Complete by: As directed    Discharge instructions   Complete by: As directed    Complete your antibiotics with Bactrim for another 3 days as he had a urinary tract infection I would recommend you follow-up with your regular physician in the outpatient setting to get labs in about 1 week   Increase activity slowly   Complete by: As directed      Allergies as of 10/14/2019   No Known Allergies     Medication List    TAKE these medications   apixaban 5 MG Tabs tablet Commonly known as: Eliquis Take 1 tablet (5 mg total) by mouth 2 (two) times daily.   brimonidine 0.2 % ophthalmic solution Commonly known as: ALPHAGAN Place 1 drop into the left eye 3 (three) times daily.   simvastatin 20 MG tablet Commonly known as: ZOCOR Take 20 mg by mouth daily.   sodium chloride 2 % ophthalmic solution Commonly known as: MURO 128 Place 1 drop into the left eye  3 (three) times daily.   Muro 128 5 % ophthalmic ointment Generic drug: sodium chloride Place 1 application into the left eye at bedtime.   sulfamethoxazole-trimethoprim 800-160 MG tablet Commonly known as: BACTRIM DS Take 1 tablet by mouth every 12 (twelve) hours.   timolol 0.5 % ophthalmic solution Commonly known as:  TIMOPTIC Place 1 drop into the left eye 2 (two) times daily.      No Known Allergies    The results of significant diagnostics from this hospitalization (including imaging, microbiology, ancillary and laboratory) are listed below for reference.    Significant Diagnostic Studies: Ct Head Wo Contrast  Result Date: 10/12/2019 CLINICAL DATA:  Unsure why he is hereAltered level of consciousness (LOC), unexplained AMS, fever EXAM: CT HEAD WITHOUT CONTRAST TECHNIQUE: Contiguous axial images were obtained from the base of the skull through the vertex without intravenous contrast. COMPARISON:  None. FINDINGS: Brain: No acute intracranial hemorrhage. No focal mass lesion. No CT evidence of acute infarction. No midline shift or mass effect. No hydrocephalus. Basilar cisterns are patent. There are periventricular and subcortical white matter hypodensities. Generalized cortical atrophy. Vascular: No hyperdense vessel or unexpected calcification. Skull: Normal. Negative for fracture or focal lesion. Sinuses/Orbits: Paranasal sinuses and mastoid air cells are clear. Orbits are clear. Other: None. IMPRESSION: 1. No acute intracranial findings. 2. Atrophy and white matter microvascular disease. Electronically Signed   By: Suzy Bouchard M.D.   On: 10/12/2019 14:18   US Renal  Result Date: 10/12/2019 CLINICAL DATA:  Acute kidney injury. EXAM: RENAL / URINARY TRACT ULTRASOUND COMPLETE COMPARISON:  CT 04/29/2014. FINDINGS: Right Kidney: Renal measurements: 11.8 x 4.3 x 5.3 cm = volume: 140.4 mL . Echogenicity within normal limits. No mass or hydronephrosis visualized. Left Kidney: Renal measurements: 11.1 x 6.4 x 6.3 cm = volume: 233.7 mL. Echogenicity within normal limits. 2.1 cm and a 1.3 cm simple cysts noted. No hydronephrosis visualized. Bladder: Appears normal for degree of bladder distention. Other: None. IMPRESSION: 1.  2.1 cm and 1.3 cm simple cysts. 2.  No acute or focal abnormality. Electronically  Signed   By: Marcello Moores  Register   On: 10/12/2019 17:13   Dg Chest Port 1 View  Result Date: 10/12/2019 CLINICAL DATA:  Fever, altered mental status EXAM: PORTABLE CHEST 1 VIEW COMPARISON:  2015 FINDINGS: The heart size and mediastinal contours are within normal limits. Both lungs are clear. No pleural effusion. No acute osseous abnormality. IMPRESSION: No acute process in the chest. Electronically Signed   By: Macy Mis M.D.   On: 10/12/2019 10:09   Dg C-arm 1-60 Min  Result Date: 09/14/2019 CLINICAL DATA:  Left hip replacement. EXAM: OPERATIVE left HIP (WITH PELVIS IF PERFORMED) 2 VIEWS TECHNIQUE: Fluoroscopic spot image(s) were submitted for interpretation post-operatively. Radiation exposure index: 46 seconds. COMPARISON:  August 30, 2019. FINDINGS: The left acetabular and femoral components appear to be well situated. Expected postoperative changes are seen in the surrounding soft tissues. IMPRESSION: Fluoroscopic guidance provided during left total hip arthroplasty. Electronically Signed   By: Marijo Conception M.D.   On: 09/14/2019 13:56   Dg Hip Port Unilat With Pelvis 1v Left  Result Date: 09/14/2019 CLINICAL DATA:  Post total left hip arthroplasty EXAM: DG HIP (WITH OR WITHOUT PELVIS) 1V PORT LEFT COMPARISON:  Intraoperative fluoroscopy 09/14/2019, comparison radiograph 08/30/2019 FINDINGS: Patient has undergone total left hip arthroplasty with normal alignment of the articulating femoral stem in the screw fixed acetabular component. Expected postsurgical soft tissue changes including soft tissue gas  and intra-articular gas are noted. Mild-to-moderate degenerative changes are again seen in the right hip. Bones of the pelvis are otherwise congruent. Vascular calcium noted in the medial thighs. Remaining soft tissues are unremarkable. IMPRESSION: Normal postoperative appearance of the left total hip arthroplasty without evidence of acute hardware complication. Electronically Signed   By:  Lovena Le M.D.   On: 09/14/2019 16:58   Dg Hip Operative Unilat W Or W/o Pelvis Left  Result Date: 09/14/2019 CLINICAL DATA:  Left hip replacement. EXAM: OPERATIVE left HIP (WITH PELVIS IF PERFORMED) 2 VIEWS TECHNIQUE: Fluoroscopic spot image(s) were submitted for interpretation post-operatively. Radiation exposure index: 46 seconds. COMPARISON:  August 30, 2019. FINDINGS: The left acetabular and femoral components appear to be well situated. Expected postoperative changes are seen in the surrounding soft tissues. IMPRESSION: Fluoroscopic guidance provided during left total hip arthroplasty. Electronically Signed   By: Marijo Conception M.D.   On: 09/14/2019 13:56   Vas [place] Lower Extremity Venous (dvt)  Result Date: 10/01/2019  Lower Venous Study Other Indications: Management of anticoagulation. Risk Factors: Surgery Left total hip replacement on 09/14/19. Performing Technologist: Ralene Cork RVT  Examination Guidelines: A complete evaluation includes B-mode imaging, spectral Doppler, color Doppler, and power Doppler as needed of all accessible portions of each vessel. Bilateral testing is considered an integral part of a complete examination. Limited examinations for reoccurring indications may be performed as noted.  +---------+---------------+---------+-----------+----------+--------------+ RIGHT    CompressibilityPhasicitySpontaneityPropertiesThrombus Aging +---------+---------------+---------+-----------+----------+--------------+ CFV      Full           Yes      Yes                                 +---------+---------------+---------+-----------+----------+--------------+ SFJ      Full                    Yes                                 +---------+---------------+---------+-----------+----------+--------------+ FV Prox  Full           Yes      Yes                                 +---------+---------------+---------+-----------+----------+--------------+ FV Mid    Full           Yes      Yes                                 +---------+---------------+---------+-----------+----------+--------------+ FV DistalFull           Yes      Yes                                 +---------+---------------+---------+-----------+----------+--------------+ POP      Full           Yes      Yes                                 +---------+---------------+---------+-----------+----------+--------------+ PTV      Full  Yes                                 +---------+---------------+---------+-----------+----------+--------------+ PERO     Full                    Yes                                 +---------+---------------+---------+-----------+----------+--------------+ GSV      Full           Yes      Yes                                 +---------+---------------+---------+-----------+----------+--------------+   +---------+---------------+---------+-----------+----------+--------------+ LEFT     CompressibilityPhasicitySpontaneityPropertiesThrombus Aging +---------+---------------+---------+-----------+----------+--------------+ CFV      Full           Yes      Yes                                 +---------+---------------+---------+-----------+----------+--------------+ SFJ      Full                    Yes                                 +---------+---------------+---------+-----------+----------+--------------+ FV Prox  Full           Yes      Yes                                 +---------+---------------+---------+-----------+----------+--------------+ FV Mid   Full           Yes      Yes                                 +---------+---------------+---------+-----------+----------+--------------+ FV DistalFull           Yes      Yes                                 +---------+---------------+---------+-----------+----------+--------------+ POP      Full           Yes      Yes                                  +---------+---------------+---------+-----------+----------+--------------+ PTV      Full                    Yes                                 +---------+---------------+---------+-----------+----------+--------------+ PERO     Full                    Yes                                 +---------+---------------+---------+-----------+----------+--------------+  GSV      Full           Yes      Yes                                 +---------+---------------+---------+-----------+----------+--------------+  Findings reported to [facility] at 4:15pm.  Summary: Right: There is no evidence of deep vein thrombosis in the lower extremity.There is no evidence of superficial venous thrombosis. Left: There is no evidence of deep vein thrombosis in the lower extremity.There is no evidence of superficial venous thrombosis.  *See table(s) above for measurements and observations. Electronically signed by Servando Snare MD on 10/01/2019 at 8:26:42 AM.    Final     Microbiology: Recent Results (from the past 240 hour(s))  Blood Culture (routine x 2)     Status: Abnormal   Collection Time: 10/12/19  9:25 AM   Specimen: BLOOD RIGHT HAND  Result Value Ref Range Status   Specimen Description BLOOD RIGHT HAND  Final   Special Requests   Final    BOTTLES DRAWN AEROBIC AND ANAEROBIC Blood Culture adequate volume   Culture  Setup Time   Final    AEROBIC BOTTLE ONLY GRAM NEGATIVE RODS CRITICAL RESULT CALLED TO, READ BACK BY AND VERIFIED WITH: Denton Brick [facility] 10/13/19 0029 JDW Performed at [facility], [address] [place], [place] [address]    Culture KLEBSIELLA PNEUMONIAE (A)  Final   Report Status 10/14/2019 FINAL  Final   Organism ID, Bacteria KLEBSIELLA PNEUMONIAE  Final      Susceptibility   Klebsiella pneumoniae - MIC*    AMPICILLIN >=32 RESISTANT Resistant     CEFAZOLIN <=4 SENSITIVE Sensitive     CEFEPIME <=1 SENSITIVE Sensitive     CEFTAZIDIME <=1 SENSITIVE  Sensitive     CEFTRIAXONE <=1 SENSITIVE Sensitive     CIPROFLOXACIN <=0.25 SENSITIVE Sensitive     GENTAMICIN <=1 SENSITIVE Sensitive     IMIPENEM <=0.25 SENSITIVE Sensitive     TRIMETH/SULFA <=20 SENSITIVE Sensitive     AMPICILLIN/SULBACTAM 8 SENSITIVE Sensitive     PIP/TAZO <=4 SENSITIVE Sensitive     Extended ESBL NEGATIVE Sensitive     * KLEBSIELLA PNEUMONIAE  Blood Culture (routine x 2)     Status: None (Preliminary result)   Collection Time: 10/12/19  9:25 AM   Specimen: BLOOD LEFT HAND  Result Value Ref Range Status   Specimen Description BLOOD LEFT HAND  Final   Special Requests   Final    BOTTLES DRAWN AEROBIC AND ANAEROBIC Blood Culture adequate volume   Culture   Final    NO GROWTH 1 DAY Performed at [facility], [address] [place], [place] [address]    Report Status PENDING  Incomplete  Blood Culture ID Panel (Reflexed)     Status: Abnormal   Collection Time: 10/12/19  9:25 AM  Result Value Ref Range Status   Enterococcus species NOT DETECTED NOT DETECTED Final   Listeria monocytogenes NOT DETECTED NOT DETECTED Final   Staphylococcus species NOT DETECTED NOT DETECTED Final   Staphylococcus aureus (BCID) NOT DETECTED NOT DETECTED Final   Streptococcus species NOT DETECTED NOT DETECTED Final   Streptococcus agalactiae NOT DETECTED NOT DETECTED Final   Streptococcus pneumoniae NOT DETECTED NOT DETECTED Final   Streptococcus pyogenes NOT DETECTED NOT DETECTED Final   Acinetobacter baumannii NOT DETECTED NOT DETECTED Final   Enterobacteriaceae species DETECTED (A) NOT  DETECTED Final    Comment: Enterobacteriaceae represent a large family of gram-negative bacteria, not a single organism. CRITICAL RESULT CALLED TO, READ BACK BY AND VERIFIED WITH: G ABBOTT PHARMD 10/13/19 0029 JDW    Enterobacter cloacae complex NOT DETECTED NOT DETECTED Final   Escherichia coli NOT DETECTED NOT DETECTED Final   Klebsiella oxytoca NOT DETECTED NOT DETECTED Final   Klebsiella  pneumoniae DETECTED (A) NOT DETECTED Final    Comment: CRITICAL RESULT CALLED TO, READ BACK BY AND VERIFIED WITH: G ABBOTT PHARMD 10/13/19 0029 JDW    Proteus species NOT DETECTED NOT DETECTED Final   Serratia marcescens NOT DETECTED NOT DETECTED Final   Carbapenem resistance NOT DETECTED NOT DETECTED Final   Haemophilus influenzae NOT DETECTED NOT DETECTED Final   Neisseria meningitidis NOT DETECTED NOT DETECTED Final   Pseudomonas aeruginosa NOT DETECTED NOT DETECTED Final   Candida albicans NOT DETECTED NOT DETECTED Final   Candida glabrata NOT DETECTED NOT DETECTED Final   Candida krusei NOT DETECTED NOT DETECTED Final   Candida parapsilosis NOT DETECTED NOT DETECTED Final   Candida tropicalis NOT DETECTED NOT DETECTED Final    Comment: Performed at [facility], [place] [place], [place] [address]  SARS CORONAVIRUS 2 (TAT 6-24 HRS) Nasopharyngeal Nasopharyngeal Swab     Status: None   Collection Time: 10/12/19 10:51 AM   Specimen: Nasopharyngeal Swab  Result Value Ref Range Status   SARS Coronavirus 2 NEGATIVE NEGATIVE Final    Comment: (NOTE) SARS-CoV-2 target nucleic acids are NOT DETECTED. The SARS-CoV-2 RNA is generally detectable in upper and lower respiratory specimens during the acute phase of infection. Negative results do not preclude SARS-CoV-2 infection, do not rule out co-infections with other pathogens, and should not be used as the sole basis for treatment or other patient management decisions. Negative results must be combined with clinical observations, patient history, and epidemiological information. The expected result is Negative. Fact Sheet for Patients: SugarRoll.be Fact Sheet for Healthcare Providers: https://www.woods-mathews.com/ This test is not yet approved or cleared by the [place] FDA and  has been authorized for detection and/or diagnosis of SARS-CoV-2 by FDA under an Emergency Use  Authorization (EUA). This EUA will remain  in effect (meaning this test can be used) for the duration of the COVID-19 declaration under Section 56 4(b)(1) of the Act, 21 U.S.C. section 360bbb-3(b)(1), unless the authorization is terminated or revoked sooner. Performed at [facility], [place] [address] [place], [place] [address]   Urine culture     Status: None   Collection Time: 10/12/19 12:49 PM   Specimen: In/Out Cath Urine  Result Value Ref Range Status   Specimen Description IN/OUT CATH URINE  Final   Special Requests NONE  Final   Culture   Final    NO GROWTH Performed at [facility], [place] [address] [place], [place] [address]    Report Status 10/13/2019 FINAL  Final  Respiratory Panel by PCR     Status: None   Collection Time: 10/12/19  5:04 PM   Specimen: Nasopharyngeal Swab; Respiratory  Result Value Ref Range Status   Adenovirus NOT DETECTED NOT DETECTED Final   Coronavirus 229E NOT DETECTED NOT DETECTED Final    Comment: (NOTE) The Coronavirus on the Respiratory Panel, DOES NOT test for the novel  Coronavirus (2019 nCoV)    Coronavirus HKU1 NOT DETECTED NOT DETECTED Final   Coronavirus NL63 NOT DETECTED NOT DETECTED Final   Coronavirus OC43 NOT DETECTED NOT  DETECTED Final   Metapneumovirus NOT DETECTED NOT DETECTED Final   Rhinovirus / Enterovirus NOT DETECTED NOT DETECTED Final   Influenza A NOT DETECTED NOT DETECTED Final   Influenza B NOT DETECTED NOT DETECTED Final   Parainfluenza Virus 1 NOT DETECTED NOT DETECTED Final   Parainfluenza Virus 2 NOT DETECTED NOT DETECTED Final   Parainfluenza Virus 3 NOT DETECTED NOT DETECTED Final   Parainfluenza Virus 4 NOT DETECTED NOT DETECTED Final   Respiratory Syncytial Virus NOT DETECTED NOT DETECTED Final   Bordetella pertussis NOT DETECTED NOT DETECTED Final   Chlamydophila pneumoniae NOT DETECTED NOT DETECTED Final   Mycoplasma pneumoniae NOT DETECTED NOT DETECTED Final    Comment: Performed at [facility], [place] [address], [place], [place] [address]     Labs: Basic Metabolic Panel: Recent Labs  Lab 10/11/19 1206 10/12/19 0925 10/13/19 0448 10/14/19 0012  NA 138 137 137 138  K 4.3 4.4 3.5 3.3*  CL 103 104 106 106  CO2 24 20* 22 23  GLUCOSE 129* 144* 128* 118*  BUN 15 16 13 14   CREATININE 0.90 1.49* 1.03 0.98  CALCIUM 9.3 9.0 8.0* 7.8*  PHOS  --   --   --  1.9*   Liver Function Tests: Recent Labs  Lab 10/11/19 1206 10/12/19 0925 10/14/19 0012  AST 13 21  --   ALT 10 13  --   ALKPHOS 78 70  --   BILITOT 0.3 1.4*  --   PROT 7.1 6.9  --   ALBUMIN 4.0 3.2* 2.3*   No results for input(s): LIPASE, AMYLASE in the last 168 hours. No results for input(s): AMMONIA in the last 168 hours. CBC: Recent Labs  Lab 10/11/19 1206 10/12/19 0925 10/13/19 0448 10/14/19 0012  WBC 8.3 16.7* 15.7* 12.8*  NEUTROABS  --  15.7*  --  9.5*  HGB 12.1* 12.5* 10.3* 10.0*  HCT 36.1* 39.0 32.1* 31.4*  MCV 87 90.7 89.4 90.0  PLT 409 319 264 243   Cardiac Enzymes: No results for input(s): CKTOTAL, CKMB, CKMBINDEX, TROPONINI in the last 168 hours. BNP: BNP (last 3 results) No results for input(s): BNP in the last 8760 hours.  ProBNP (last 3 results) No results for input(s): PROBNP in the last 8760 hours.  CBG: No results for input(s): GLUCAP in the last 168 hours.     Signed:  Nita Sells MD   [facility] 10/14/2019, 8:39 AM

## 2019-10-14 NOTE — Plan of Care (Signed)
  Problem: Education: Goal: Knowledge of General Education information will improve Description: Including pain rating scale, medication(s)/side effects and non-pharmacologic comfort measures Outcome: Adequate for Discharge   Problem: Health Behavior/Discharge Planning: Goal: Ability to manage health-related needs will improve 10/14/2019 1055 by Baldo Ash, RN Outcome: Adequate for Discharge 10/14/2019 0814 by Baldo Ash, RN Outcome: Progressing   Problem: Clinical Measurements: Goal: Ability to maintain clinical measurements within normal limits will improve Outcome: Adequate for Discharge Goal: Will remain free from infection Outcome: Adequate for Discharge Goal: Diagnostic test results will improve Outcome: Adequate for Discharge Goal: Respiratory complications will improve Outcome: Adequate for Discharge Goal: Cardiovascular complication will be avoided Outcome: Adequate for Discharge   Problem: Activity: Goal: Risk for activity intolerance will decrease Outcome: Adequate for Discharge   Problem: Nutrition: Goal: Adequate nutrition will be maintained Outcome: Adequate for Discharge   Problem: Coping: Goal: Level of anxiety will decrease Outcome: Adequate for Discharge   Problem: Elimination: Goal: Will not experience complications related to bowel motility Outcome: Adequate for Discharge Goal: Will not experience complications related to urinary retention Outcome: Adequate for Discharge   Problem: Pain Managment: Goal: General experience of comfort will improve Outcome: Adequate for Discharge   Problem: Safety: Goal: Ability to remain free from injury will improve Outcome: Adequate for Discharge   Problem: Skin Integrity: Goal: Risk for impaired skin integrity will decrease Outcome: Adequate for Discharge   Problem: Urinary Elimination: Goal: Signs and symptoms of infection will decrease Outcome: Adequate for Discharge

## 2019-10-14 NOTE — Plan of Care (Signed)
  Problem: Health Behavior/Discharge Planning: Goal: Ability to manage health-related needs will improve Outcome: Progressing   

## 2019-10-16 LAB — CULTURE, BLOOD (ROUTINE X 2): Special Requests: ADEQUATE

## 2019-10-18 ENCOUNTER — Telehealth: Payer: Self-pay | Admitting: *Deleted

## 2019-10-18 ENCOUNTER — Telehealth: Payer: Self-pay

## 2019-10-18 NOTE — Telephone Encounter (Signed)
30 Day Ortho bundle call completed.

## 2019-10-18 NOTE — Telephone Encounter (Signed)
-----   Message from Isaiah Serge, NP sent at 10/12/2019  9:53 AM EST ----- Labs are good, Hgb came up form 10 to 12

## 2019-10-18 NOTE — Care Plan (Signed)
RNCM call to patient and spoke with his wife. She states he was napping at the moment. Patient has been recently hospitalized as she found him in the bedroom with altered mental function and talking jibberish one day last week. He was admitted and discharged already with diagnoses of Sepsis. She reports he had a bladder infection. Overall, the hip is doing very well at 1 month post-op from the hip replacement. Reminded of f/u appointments. Reviewed 30 day Patient satisfaction survey that has been provided by [facility] in place of 10 question survey in [place].  1. Before surgery, I was provided sufficient education regarding my surgery and the bundle program. Patient answer: Strongly agree 2. I was satisfied with the care provided by the nurse at the facility where my surgery was performed. Patient answer: Strongly agree 3. Following surgery, I received sufficient postoperative care instructions.Patient answer:Strongly agree 4. I would recommend my surgeon and this bundle program to others. Patient answer: Strongly agree Other comments: "We 100% recommend this program".

## 2019-10-18 NOTE — Telephone Encounter (Signed)
Notes recorded by Frederik Schmidt, RN on 10/18/2019 at 12:38 PM EST  The patient has been notified of the result and verbalized understanding. All questions (if any) were answered.  Frederik Schmidt, RN 10/18/2019 12:38 PM

## 2019-10-21 ENCOUNTER — Ambulatory Visit: Payer: Medicare Other | Admitting: Cardiology

## 2019-10-22 DIAGNOSIS — H35039 Hypertensive retinopathy, unspecified eye: Secondary | ICD-10-CM | POA: Diagnosis not present

## 2019-10-22 DIAGNOSIS — I10 Essential (primary) hypertension: Secondary | ICD-10-CM | POA: Diagnosis not present

## 2019-10-22 DIAGNOSIS — I480 Paroxysmal atrial fibrillation: Secondary | ICD-10-CM | POA: Diagnosis not present

## 2019-10-22 DIAGNOSIS — Z23 Encounter for immunization: Secondary | ICD-10-CM | POA: Diagnosis not present

## 2019-10-22 DIAGNOSIS — Z1159 Encounter for screening for other viral diseases: Secondary | ICD-10-CM | POA: Diagnosis not present

## 2019-10-22 DIAGNOSIS — A419 Sepsis, unspecified organism: Secondary | ICD-10-CM | POA: Diagnosis not present

## 2019-10-22 DIAGNOSIS — Z125 Encounter for screening for malignant neoplasm of prostate: Secondary | ICD-10-CM | POA: Diagnosis not present

## 2019-10-22 DIAGNOSIS — E7800 Pure hypercholesterolemia, unspecified: Secondary | ICD-10-CM | POA: Diagnosis not present

## 2019-10-22 DIAGNOSIS — E1169 Type 2 diabetes mellitus with other specified complication: Secondary | ICD-10-CM | POA: Diagnosis not present

## 2019-10-22 DIAGNOSIS — Z87891 Personal history of nicotine dependence: Secondary | ICD-10-CM | POA: Diagnosis not present

## 2019-10-27 ENCOUNTER — Inpatient Hospital Stay: Payer: Medicare Other | Admitting: Orthopedic Surgery

## 2019-11-03 ENCOUNTER — Other Ambulatory Visit: Payer: Self-pay

## 2019-11-03 ENCOUNTER — Ambulatory Visit (HOSPITAL_COMMUNITY): Payer: Medicare Other | Attending: Cardiology

## 2019-11-03 DIAGNOSIS — I4891 Unspecified atrial fibrillation: Secondary | ICD-10-CM

## 2019-11-03 MED ORDER — PERFLUTREN LIPID MICROSPHERE
1.0000 mL | INTRAVENOUS | Status: AC | PRN
  Administered 2019-11-03: 1 mL via INTRAVENOUS

## 2019-11-08 ENCOUNTER — Ambulatory Visit: Payer: Medicare Other | Admitting: Orthopedic Surgery

## 2019-12-08 ENCOUNTER — Telehealth: Payer: Self-pay | Admitting: Interventional Cardiology

## 2019-12-08 NOTE — Telephone Encounter (Signed)
Pt's wife calling, after pt giving me permission to talk to his wife, wanted to know about getting some help to be able to get pt's medication Eliquis. Wife stated that pt's medication was over $500. I informed the wife that I could leave a couple of boxes of Eliquis 5 mg tablet downstairs for them to pick up until [place], [place], has a chance to take a look at this matter. I advised the wife that if they have any other problems, questions or concerns, to please give our office a call back. Wife verbalized understanding.

## 2019-12-09 NOTE — Telephone Encounter (Signed)
**Note De-Identified  Obfuscation** I called the pt but got not answer so I LMTCB. I then called [place] and s/w Loletha Grayer who advised me that they have no idea how much the pts Eliquis will be as the pt has not presented a new Ins card for 2021. Loletha Grayer states he tried to explain this to the pt and a caregiver that all they need is his new Ins card and they can fill his Eliquis. Loletha Grayer states that unless the pt no longer has PartD a PA will not be needed either.

## 2019-12-10 NOTE — Telephone Encounter (Signed)
**Note De-Identified  Obfuscation** The pt called me back. He states that he does not know if he got a new INS card or not but will look.  He asked me to talk with his wife and I agreed. She came to the phone stating that they were told that the pts Eliquis will cost him over $500.  I have advised her to contact the pts INS provider to ask if he has a deductible to meet for this year as it is a new year, how much his deductible is and how much Eliquis will cost him once his deductible is met if there is one.  She is also advised that if they cannot afford Eliquis to call us back to discuss switching to Coumadin.  She verbalized understanding and thanked me for calling them.

## 2019-12-13 NOTE — Progress Notes (Signed)
Cardiology Office Note:    Date:  12/14/2019   ID:  [name], DOB May 21, 1941, MRN [id number]  PCP:  Kathyrn Lass, MD  Cardiologist:  Sinclair Grooms, MD   Referring MD: Kathyrn Lass, MD   Chief Complaint  Patient presents with  . Atrial Fibrillation    History of Present Illness:    [name] is a 79 y.o. adult with a hx of PAF, DM II, hypertension,, and attention deficit disorder.Also has h/o PAC with block.  He is doing well.  No bleeding on anticoagulation.  No neurological complaints.  Accompanied by his wife.  He denies shortness of breath, orthopnea, syncope, edema, chest pain, and claudication.  Past Medical History:  Diagnosis Date  . Arthritis   . Attention deficit disorder   . Blind left eye   . Diabetes mellitus without complication ([place])   . Hyperlipidemia   . Hypertension     Past Surgical History:  Procedure Laterality Date  . EYE SURGERY Left    x3  . KNEE ARTHROSCOPY Bilateral 03/29/2014   Procedure: RIGHT KNEE ARTHROSCOPY AND DEBRIDEMENT, LEFT KNEE ASPIRATION,  LEFT KNEE ARTHROSCOPY AND DEBRIDEMENT.;  Surgeon: Meredith Pel, MD;  Location: [facility];  Service: Orthopedics;  Laterality: Bilateral;  RIGHT KNEE ARTHROSCOPY AND DEBRIDEMENT, LEFT KNEE ASPIRATION,  LEFT KNEE ARTHROSCOPY AND DEBRIDEMENT.  Marland Kitchen KNEE ARTHROTOMY Bilateral 04/26/2014   Procedure: KNEE ARTHROTOMY, SYNOVECTOMY, ANTIBIOTIC BEAD PLACEMENT.;  Surgeon: Meredith Pel, MD;  Location: [facility];  Service: Orthopedics;  Laterality: Bilateral;  . lipoma removal, right upper back    . TONSILLECTOMY    . TOTAL HIP ARTHROPLASTY Left 09/14/2019  . TOTAL HIP ARTHROPLASTY Left 09/14/2019   Procedure: LEFT TOTAL HIP ARTHROPLASTY ANTERIOR APPROACH;  Surgeon: Meredith Pel, MD;  Location: [facility];  Service: Orthopedics;  Laterality: Left;    Current Medications: Current Meds  Medication Sig  . apixaban (ELIQUIS) 5 MG TABS tablet Take 1 tablet (5 mg total) by mouth 2 (two) times  daily.  . brimonidine (ALPHAGAN) 0.2 % ophthalmic solution Place 1 drop into the left eye 3 (three) times daily.   . Multiple Vitamin (MULTIVITAMIN) tablet Take 2 tablets by mouth daily.  . simvastatin (ZOCOR) 20 MG tablet Take 20 mg by mouth daily.  . sodium chloride (MURO 128) 2 % ophthalmic solution Place 1 drop into the left eye 3 (three) times daily.  . sodium chloride (MURO 128) 5 % ophthalmic ointment Place 1 application into the left eye at bedtime.  . timolol (TIMOPTIC) 0.5 % ophthalmic solution Place 1 drop into the left eye 2 (two) times daily.      Allergies:   Patient has no known allergies.   Social History   Socioeconomic History  . Marital status: Married    Spouse name: Not on file  . Number of children: Not on file  . Years of education: Not on file  . Highest education level: Not on file  Occupational History  . Not on file  Tobacco Use  . Smoking status: Former Research scientist (life sciences)  . Smokeless tobacco: Never Used  Substance and Sexual Activity  . Alcohol use: Yes    Comment: occasional   . Drug use: No  . Sexual activity: Not on file  Other Topics Concern  . Not on file  Social History Narrative  . Not on file   Social Determinants of Health   Financial Resource Strain:   . Difficulty of Paying Living Expenses: Not on file  Food Insecurity:   . Worried About Charity fundraiser in the Last Year: Not on file  . Ran Out of Food in the Last Year: Not on file  Transportation Needs:   . Lack of Transportation (Medical): Not on file  . Lack of Transportation (Non-Medical): Not on file  Physical Activity:   . Days of Exercise per Week: Not on file  . Minutes of Exercise per Session: Not on file  Stress:   . Feeling of Stress : Not on file  Social Connections:   . Frequency of Communication with Friends and Family: Not on file  . Frequency of Social Gatherings with Friends and Family: Not on file  . Attends Religious Services: Not on file  . Active Member of Clubs  or Organizations: Not on file  . Attends Archivist Meetings: Not on file  . Marital Status: Not on file     Family History: The patient's family history includes Heart attack in her brother, father, and sister.  ROS:   Please see the history of present illness.    Concerned about cost of DOAC therapy.  Concerned about taking the COVID-19 vaccine.  All other systems reviewed and are negative.  EKGs/Labs/Other Studies Reviewed:    The following studies were reviewed today: Long term Monitor 11/22/2019  : Study Highlights   Normal sinus rhythm and sinus bradycardia  Second-degree AV block, predominantly nocturnal and presumably during sleep. Occasional episodes during waking hours (Mobitz 1 second-degree AV block).  Atrial fibrillation and atrial flutter noted intermittently. Fair rate control with heart rates generally less than 100. Occasional heart rates greater than or equal to 130.   ECHOCARDIOGRAM  11/03/2019 IMPRESSIONS  1. Left ventricular ejection fraction, by visual estimation, is 60 to 65%. The left ventricle has normal function. There is no left ventricular hypertrophy.  2. Definity contrast agent was given IV to delineate the left ventricular endocardial borders.  3. Left ventricular diastolic parameters are consistent with Grade I diastolic dysfunction (impaired relaxation).  4. The left ventricle has no regional wall motion abnormalities.  5. Global right ventricle has normal systolic function.The right ventricular size is normal. No increase in right ventricular wall thickness.  6. Left atrial size was normal.  7. Right atrial size was normal.  8. Mild mitral annular calcification.  9. The mitral valve is normal in structure. Mild mitral valve regurgitation. No evidence of mitral stenosis. 10. The tricuspid valve is normal in structure. Tricuspid valve regurgitation is trivial. 11. The aortic valve is tricuspid. Aortic valve regurgitation is trivial. No  evidence of aortic valve sclerosis or stenosis. 12. The tricuspid regurgitant velocity is 2.84 m/s, and with an assumed right atrial pressure of 3 mmHg, the estimated right ventricular systolic pressure is mildly elevated at 35.3 mmHg. 13. The inferior vena cava is normal in size with greater than 50% respiratory variability, suggesting right atrial pressure of 3 mmHg.  EKG:  EKG EKG is not performed today.  The most recent electrocardiograms from late 2020 demonstrated sinus rhythm.  Recent Labs: 09/15/2019: Magnesium 1.7 10/12/2019: ALT 13 10/14/2019: BUN 14; Creatinine, Ser 0.98; Hemoglobin 10.0; Platelets 243; Potassium 3.3; Sodium 138  Recent Lipid Panel    Component Value Date/Time   CHOL 166 09/15/2019 0323   TRIG 33 09/15/2019 0323   HDL 78 09/15/2019 0323   CHOLHDL 2.1 09/15/2019 0323   VLDL 7 09/15/2019 0323   LDLCALC 81 09/15/2019 0323    Physical Exam:    VS:  BP 136/84   Pulse 84   Ht 5\' 8"  (1.727 m)   Wt 173 lb 1.9 oz (78.5 kg)   SpO2 98%   BMI 26.32 kg/m     Wt Readings from Last 3 Encounters:  12/14/19 173 lb 1.9 oz (78.5 kg)  10/12/19 170 lb (77.1 kg)  10/11/19 170 lb (77.1 kg)     GEN: Consistent with age. No acute distress HEENT: Normal NECK: No JVD. LYMPHATICS: No lymphadenopathy CARDIAC: Irregularly irregular consistent with atrial fibrillation with controlled rate RRR  murmur, gallop, or edema. VASCULAR:  Normal Pulses. No bruits. RESPIRATORY:  Clear to auscultation without rales, wheezing or rhonchi  ABDOMEN: Soft, non-tender, non-distended, No pulsatile mass, MUSCULOSKELETAL: No deformity  SKIN: Warm and dry NEUROLOGIC:  Alert and oriented x 3 PSYCHIATRIC:  Normal affect   ASSESSMENT:    1. PAF (paroxysmal atrial fibrillation) ([place])   2. Essential hypertension   3. Mixed hyperlipidemia   4. Controlled type 2 diabetes mellitus with other circulatory complication, without long-term current use of insulin ([place])   5. Chronic anticoagulation    6. Educated about COVID-19 virus infection    PLAN:    In order of problems listed above:  1. Continued paroxysmal atrial for ablation and clinically in A. fib today and totally asymptomatic 2. Repeat blood pressure 130/82 mmHg.  Current therapy is none.  Advocated for decrease salt in diet.  Closely monitor blood pressure.  Target 130/80 mmHg or less. 3. LDL target less than 70.  Most recent LDL was 81 in October 2020.   4. A1c target less than 7 5. No bleeding complications.  Affordability issues on Eliquis.  We discussed switching to Coumadin.  They prefer to stay with Eliquis.  We will again attempt to help them with getting a discount if possible. 6. 3W's is being adhered to.  COVID-19 vaccine reluctance exist.  I advocated in favor of vaccination.   Close blood pressure follow-up with low threshold for starting therapy.  Clinical follow-up in 1 year.   Medication Adjustments/Labs and Tests Ordered: Current medicines are reviewed at length with the patient today.  Concerns regarding medicines are outlined above.  No orders of the defined types were placed in this encounter.  No orders of the defined types were placed in this encounter.   There are no Patient Instructions on file for this visit.   Signed, Sinclair Grooms, MD  12/14/2019 10:25 AM    [place]

## 2019-12-14 ENCOUNTER — Encounter: Payer: Self-pay | Admitting: Interventional Cardiology

## 2019-12-14 ENCOUNTER — Other Ambulatory Visit: Payer: Self-pay

## 2019-12-14 ENCOUNTER — Ambulatory Visit (INDEPENDENT_AMBULATORY_CARE_PROVIDER_SITE_OTHER): Payer: Medicare Other | Admitting: Interventional Cardiology

## 2019-12-14 ENCOUNTER — Telehealth: Payer: Self-pay | Admitting: *Deleted

## 2019-12-14 VITALS — BP 136/84 | HR 84 | Ht 68.0 in | Wt 173.1 lb

## 2019-12-14 DIAGNOSIS — I480 Paroxysmal atrial fibrillation: Secondary | ICD-10-CM | POA: Diagnosis not present

## 2019-12-14 DIAGNOSIS — E782 Mixed hyperlipidemia: Secondary | ICD-10-CM

## 2019-12-14 DIAGNOSIS — Z7901 Long term (current) use of anticoagulants: Secondary | ICD-10-CM

## 2019-12-14 DIAGNOSIS — Z7189 Other specified counseling: Secondary | ICD-10-CM | POA: Diagnosis not present

## 2019-12-14 DIAGNOSIS — E1159 Type 2 diabetes mellitus with other circulatory complications: Secondary | ICD-10-CM

## 2019-12-14 DIAGNOSIS — I10 Essential (primary) hypertension: Secondary | ICD-10-CM | POA: Diagnosis not present

## 2019-12-14 NOTE — Telephone Encounter (Signed)
Attempted 90 day ortho bundle call; No answer and left VM requesting call back.

## 2019-12-14 NOTE — Patient Instructions (Signed)

## 2019-12-17 ENCOUNTER — Telehealth: Payer: Self-pay | Admitting: *Deleted

## 2019-12-17 ENCOUNTER — Other Ambulatory Visit: Payer: Self-pay | Admitting: *Deleted

## 2019-12-17 DIAGNOSIS — I10 Essential (primary) hypertension: Secondary | ICD-10-CM | POA: Diagnosis not present

## 2019-12-17 DIAGNOSIS — M1612 Unilateral primary osteoarthritis, left hip: Secondary | ICD-10-CM | POA: Diagnosis not present

## 2019-12-17 DIAGNOSIS — E7800 Pure hypercholesterolemia, unspecified: Secondary | ICD-10-CM | POA: Diagnosis not present

## 2019-12-17 DIAGNOSIS — M179 Osteoarthritis of knee, unspecified: Secondary | ICD-10-CM | POA: Diagnosis not present

## 2019-12-17 DIAGNOSIS — I480 Paroxysmal atrial fibrillation: Secondary | ICD-10-CM | POA: Diagnosis not present

## 2019-12-17 DIAGNOSIS — E1169 Type 2 diabetes mellitus with other specified complication: Secondary | ICD-10-CM | POA: Diagnosis not present

## 2019-12-17 DIAGNOSIS — H35039 Hypertensive retinopathy, unspecified eye: Secondary | ICD-10-CM | POA: Diagnosis not present

## 2019-12-17 MED ORDER — APIXABAN 5 MG PO TABS: 5.0000 mg | ORAL_TABLET | Freq: Two times a day (BID) | ORAL | 1 refills | Status: DC

## 2019-12-17 NOTE — Telephone Encounter (Signed)
Prescription refill request for Eliquis received.  Last office visit: [place], 12-14-2019 Scr:  1.03, 10-12-2020 Age: 79 yo Weight: 78.5 kg  Prescription refill sent to [facility] mail order.

## 2019-12-17 NOTE — Telephone Encounter (Signed)
Ortho bundle 90 day call completed with survey.

## 2020-01-13 ENCOUNTER — Ambulatory Visit: Payer: Medicare Other | Admitting: Interventional Cardiology

## 2020-01-17 DIAGNOSIS — L602 Onychogryphosis: Secondary | ICD-10-CM | POA: Diagnosis not present

## 2020-01-17 DIAGNOSIS — E1351 Other specified diabetes mellitus with diabetic peripheral angiopathy without gangrene: Secondary | ICD-10-CM | POA: Diagnosis not present

## 2020-01-17 DIAGNOSIS — L84 Corns and callosities: Secondary | ICD-10-CM | POA: Diagnosis not present

## 2020-01-21 ENCOUNTER — Telehealth: Payer: Self-pay

## 2020-01-21 NOTE — Telephone Encounter (Signed)
**Note De-Identified  Obfuscation** The pt left his completed/signed [employer] Pt Asst application at the office without any documents. He is requesting that we call him at [phone] or [phone] when we have completed the provider part of the application.  I did call and s/w the pts wife. I advised her that I have completed the MD part of the pts [employer] pt asst application, scanned and emailed it to Dr Darliss Ridgel nurse to obtain his signature, date and to fax to [employer]. I did include a cover letter in the email.  The pts wife is also advised to call [employer] @ [phone] on Monday to check the progress of the pts application as they may need to provide the pts 2020 proof of income and 2021 out of pocket expense report.  She verbalized understanding and thanked me for calling them back as requested.

## 2020-01-21 NOTE — Telephone Encounter (Signed)
Paperwork faxed °

## 2020-01-24 DIAGNOSIS — I480 Paroxysmal atrial fibrillation: Secondary | ICD-10-CM | POA: Diagnosis not present

## 2020-01-24 DIAGNOSIS — E7800 Pure hypercholesterolemia, unspecified: Secondary | ICD-10-CM | POA: Diagnosis not present

## 2020-01-24 DIAGNOSIS — Z6827 Body mass index (BMI) 27.0-27.9, adult: Secondary | ICD-10-CM | POA: Diagnosis not present

## 2020-01-24 DIAGNOSIS — E1169 Type 2 diabetes mellitus with other specified complication: Secondary | ICD-10-CM | POA: Diagnosis not present

## 2020-01-24 DIAGNOSIS — D649 Anemia, unspecified: Secondary | ICD-10-CM | POA: Diagnosis not present

## 2020-01-26 DIAGNOSIS — H35039 Hypertensive retinopathy, unspecified eye: Secondary | ICD-10-CM | POA: Diagnosis not present

## 2020-01-26 DIAGNOSIS — E7800 Pure hypercholesterolemia, unspecified: Secondary | ICD-10-CM | POA: Diagnosis not present

## 2020-01-26 DIAGNOSIS — E1169 Type 2 diabetes mellitus with other specified complication: Secondary | ICD-10-CM | POA: Diagnosis not present

## 2020-01-26 DIAGNOSIS — I480 Paroxysmal atrial fibrillation: Secondary | ICD-10-CM | POA: Diagnosis not present

## 2020-01-26 DIAGNOSIS — I10 Essential (primary) hypertension: Secondary | ICD-10-CM | POA: Diagnosis not present

## 2020-01-26 DIAGNOSIS — M1612 Unilateral primary osteoarthritis, left hip: Secondary | ICD-10-CM | POA: Diagnosis not present

## 2020-01-26 DIAGNOSIS — M179 Osteoarthritis of knee, unspecified: Secondary | ICD-10-CM | POA: Diagnosis not present

## 2020-01-26 DIAGNOSIS — D649 Anemia, unspecified: Secondary | ICD-10-CM | POA: Diagnosis not present

## 2020-01-27 NOTE — Telephone Encounter (Signed)
Letter received from [employer] via fax stating that they have denied the pt asst for his Eliquis at this time. Reason: Eliquis is covered by the pts INS plan.  The letter states that they notified the pt of this denial as well.

## 2020-02-15 ENCOUNTER — Telehealth: Payer: Self-pay | Admitting: Interventional Cardiology

## 2020-02-15 NOTE — Telephone Encounter (Signed)
   Pt would like to know who called him. Advised no notes found who's calling him

## 2020-03-17 DIAGNOSIS — H16402 Unspecified corneal neovascularization, left eye: Secondary | ICD-10-CM | POA: Diagnosis not present

## 2020-03-17 DIAGNOSIS — H1812 Bullous keratopathy, left eye: Secondary | ICD-10-CM | POA: Diagnosis not present

## 2020-03-17 DIAGNOSIS — E119 Type 2 diabetes mellitus without complications: Secondary | ICD-10-CM | POA: Diagnosis not present

## 2020-03-17 DIAGNOSIS — H5211 Myopia, right eye: Secondary | ICD-10-CM | POA: Diagnosis not present

## 2020-03-29 DIAGNOSIS — I10 Essential (primary) hypertension: Secondary | ICD-10-CM | POA: Diagnosis not present

## 2020-03-29 DIAGNOSIS — D649 Anemia, unspecified: Secondary | ICD-10-CM | POA: Diagnosis not present

## 2020-03-29 DIAGNOSIS — M179 Osteoarthritis of knee, unspecified: Secondary | ICD-10-CM | POA: Diagnosis not present

## 2020-03-29 DIAGNOSIS — M1612 Unilateral primary osteoarthritis, left hip: Secondary | ICD-10-CM | POA: Diagnosis not present

## 2020-03-29 DIAGNOSIS — I480 Paroxysmal atrial fibrillation: Secondary | ICD-10-CM | POA: Diagnosis not present

## 2020-03-29 DIAGNOSIS — E1169 Type 2 diabetes mellitus with other specified complication: Secondary | ICD-10-CM | POA: Diagnosis not present

## 2020-03-29 DIAGNOSIS — H35039 Hypertensive retinopathy, unspecified eye: Secondary | ICD-10-CM | POA: Diagnosis not present

## 2020-03-29 DIAGNOSIS — E7800 Pure hypercholesterolemia, unspecified: Secondary | ICD-10-CM | POA: Diagnosis not present

## 2020-04-05 DIAGNOSIS — H1812 Bullous keratopathy, left eye: Secondary | ICD-10-CM | POA: Diagnosis not present

## 2020-04-27 DIAGNOSIS — M1612 Unilateral primary osteoarthritis, left hip: Secondary | ICD-10-CM | POA: Diagnosis not present

## 2020-04-27 DIAGNOSIS — I480 Paroxysmal atrial fibrillation: Secondary | ICD-10-CM | POA: Diagnosis not present

## 2020-04-27 DIAGNOSIS — E1169 Type 2 diabetes mellitus with other specified complication: Secondary | ICD-10-CM | POA: Diagnosis not present

## 2020-04-27 DIAGNOSIS — D649 Anemia, unspecified: Secondary | ICD-10-CM | POA: Diagnosis not present

## 2020-04-27 DIAGNOSIS — M179 Osteoarthritis of knee, unspecified: Secondary | ICD-10-CM | POA: Diagnosis not present

## 2020-04-27 DIAGNOSIS — I10 Essential (primary) hypertension: Secondary | ICD-10-CM | POA: Diagnosis not present

## 2020-04-27 DIAGNOSIS — E7800 Pure hypercholesterolemia, unspecified: Secondary | ICD-10-CM | POA: Diagnosis not present

## 2020-04-27 DIAGNOSIS — H35039 Hypertensive retinopathy, unspecified eye: Secondary | ICD-10-CM | POA: Diagnosis not present

## 2020-05-02 DIAGNOSIS — H18422 Band keratopathy, left eye: Secondary | ICD-10-CM | POA: Diagnosis not present

## 2020-05-02 DIAGNOSIS — H5710 Ocular pain, unspecified eye: Secondary | ICD-10-CM | POA: Diagnosis not present

## 2020-05-02 DIAGNOSIS — H5440 Blindness, one eye, unspecified eye: Secondary | ICD-10-CM | POA: Diagnosis not present

## 2020-05-24 DIAGNOSIS — I441 Atrioventricular block, second degree: Secondary | ICD-10-CM | POA: Insufficient documentation

## 2020-06-05 DIAGNOSIS — E785 Hyperlipidemia, unspecified: Secondary | ICD-10-CM | POA: Diagnosis not present

## 2020-06-05 DIAGNOSIS — I10 Essential (primary) hypertension: Secondary | ICD-10-CM | POA: Diagnosis not present

## 2020-06-05 DIAGNOSIS — H5442A5 Blindness left eye category 5, normal vision right eye: Secondary | ICD-10-CM | POA: Diagnosis not present

## 2020-06-05 DIAGNOSIS — H5462 Unqualified visual loss, left eye, normal vision right eye: Secondary | ICD-10-CM | POA: Diagnosis not present

## 2020-06-05 DIAGNOSIS — Z87891 Personal history of nicotine dependence: Secondary | ICD-10-CM | POA: Diagnosis not present

## 2020-06-05 DIAGNOSIS — F988 Other specified behavioral and emotional disorders with onset usually occurring in childhood and adolescence: Secondary | ICD-10-CM | POA: Diagnosis not present

## 2020-06-05 DIAGNOSIS — H5440 Blindness, one eye, unspecified eye: Secondary | ICD-10-CM | POA: Diagnosis not present

## 2020-06-05 DIAGNOSIS — E119 Type 2 diabetes mellitus without complications: Secondary | ICD-10-CM | POA: Diagnosis not present

## 2020-06-05 DIAGNOSIS — Z79899 Other long term (current) drug therapy: Secondary | ICD-10-CM | POA: Diagnosis not present

## 2020-06-05 DIAGNOSIS — H5712 Ocular pain, left eye: Secondary | ICD-10-CM | POA: Diagnosis not present

## 2020-06-05 DIAGNOSIS — Z7901 Long term (current) use of anticoagulants: Secondary | ICD-10-CM | POA: Diagnosis not present

## 2020-06-05 DIAGNOSIS — I480 Paroxysmal atrial fibrillation: Secondary | ICD-10-CM | POA: Diagnosis not present

## 2020-06-05 DIAGNOSIS — Z791 Long term (current) use of non-steroidal anti-inflammatories (NSAID): Secondary | ICD-10-CM | POA: Diagnosis not present

## 2020-06-05 DIAGNOSIS — H5710 Ocular pain, unspecified eye: Secondary | ICD-10-CM | POA: Diagnosis not present

## 2020-08-06 IMAGING — DX DG HIP (WITH OR WITHOUT PELVIS) 1V PORT*L*
2 series · 2 of 2 positions shown · non-contrast
Comparison: Intraoperative fluoroscopy 09/14/2019, comparison
radiograph 08/30/2019

CLINICAL DATA: Post total left hip arthroplasty

EXAM:
DG HIP (WITH OR WITHOUT PELVIS) 1V PORT LEFT

[pelvis ap]
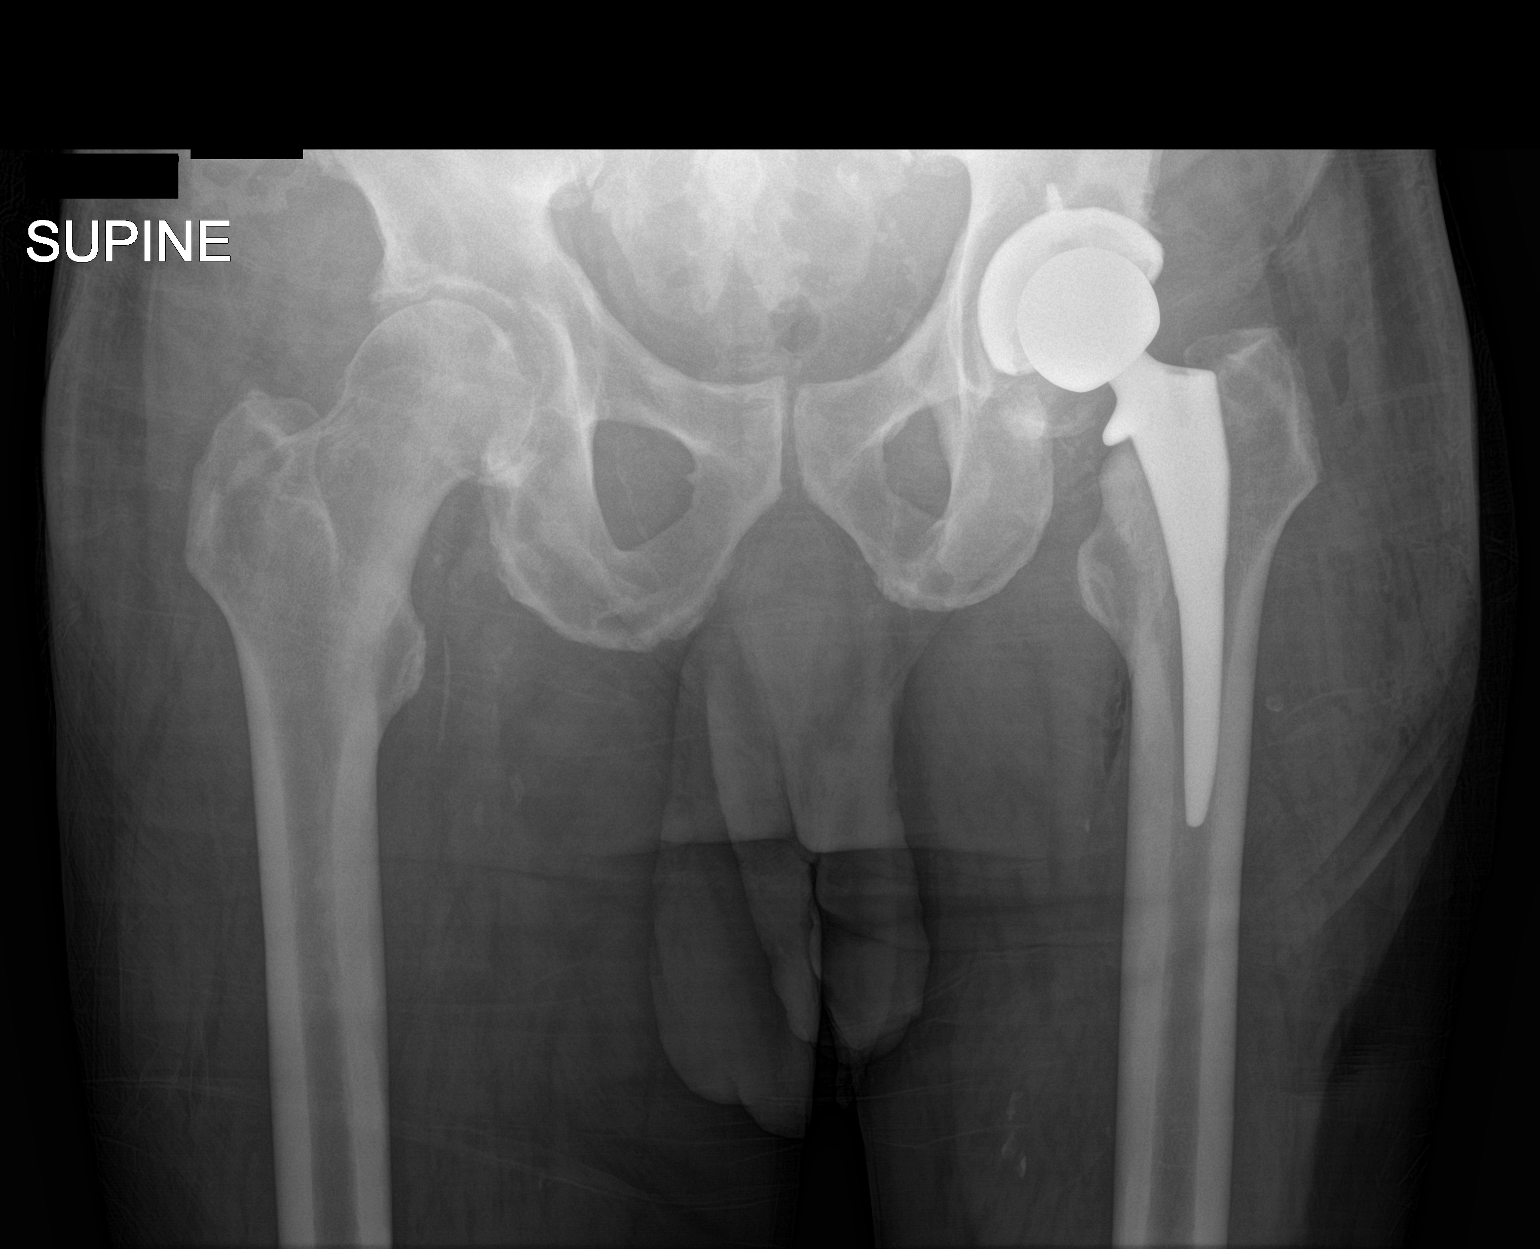

[hip lat]
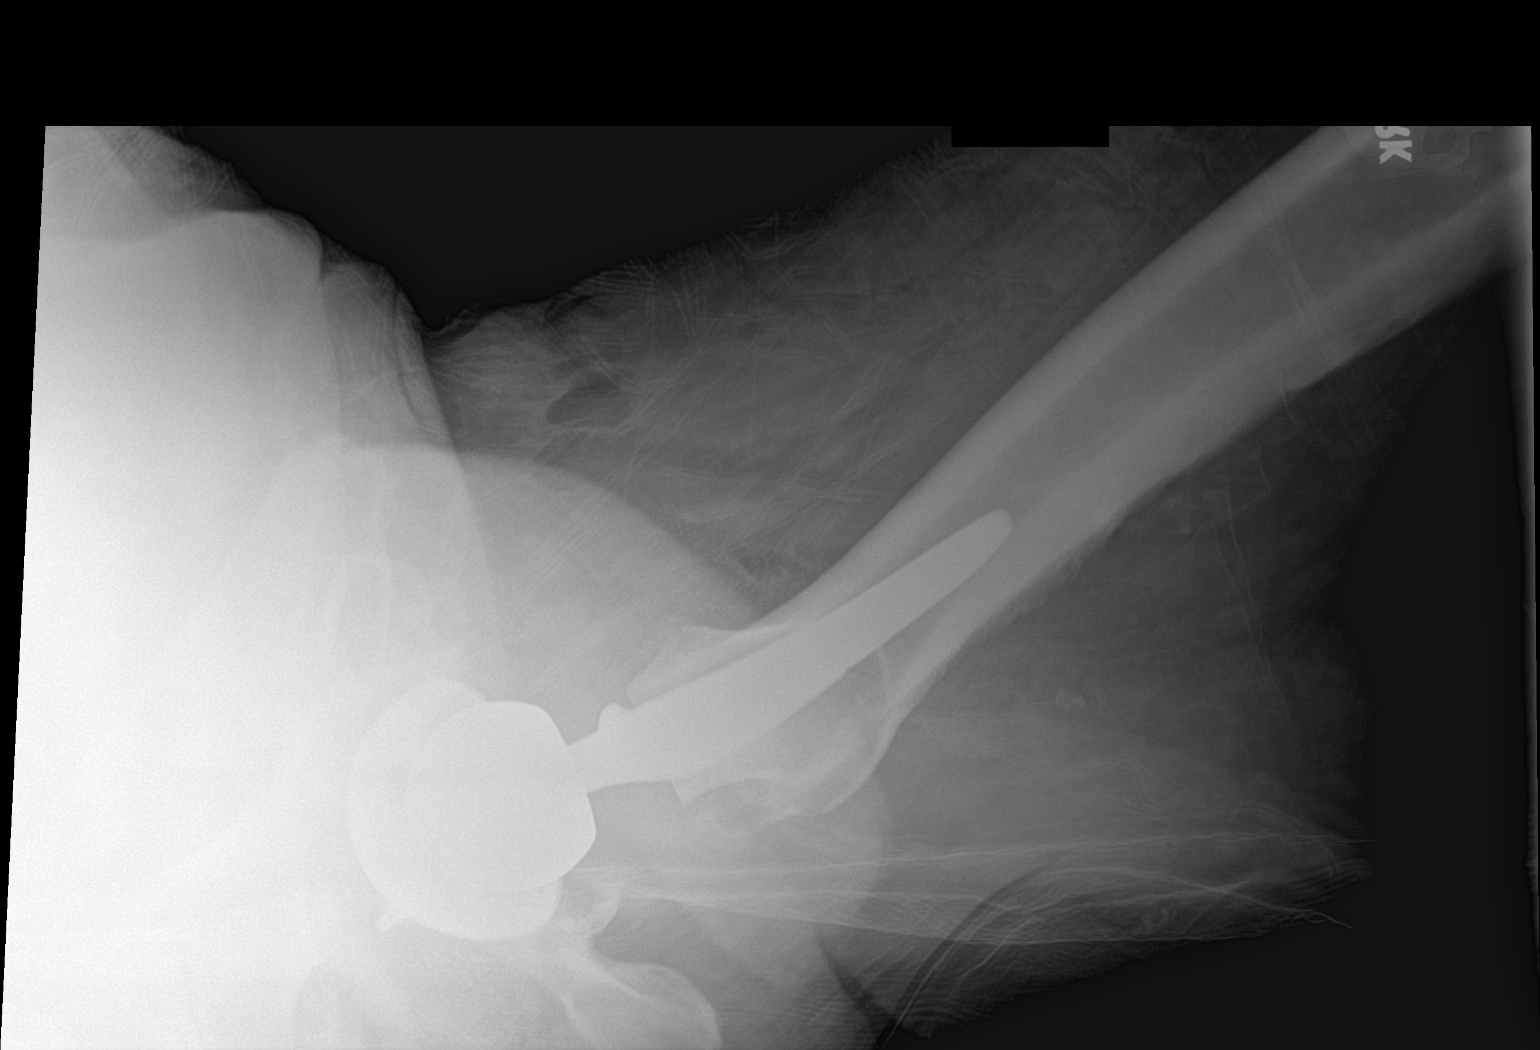

[2 of 2 positions shown; findings below may reference images not displayed]

FINDINGS: Patient has undergone total left hip arthroplasty with normal
alignment of the articulating femoral stem in the screw fixed
acetabular component. Expected postsurgical soft tissue changes
including soft tissue gas and intra-articular gas are noted.
Mild-to-moderate degenerative changes are again seen in the right
hip. Bones of the pelvis are otherwise congruent. Vascular calcium
noted in the medial thighs. Remaining soft tissues are unremarkable.
IMPRESSION: Normal postoperative appearance of the left total hip arthroplasty
without evidence of acute hardware complication.

## 2020-08-06 IMAGING — RF DG HIP (WITH PELVIS) OPERATIVE*L*
1 series · 2 of 2 positions shown · non-contrast
Comparison: August 30, 2019.

CLINICAL DATA: Left hip replacement.

EXAM:
OPERATIVE left HIP (WITH PELVIS IF PERFORMED) 2 VIEWS
TECHNIQUE: Fluoroscopic spot image(s) were submitted for interpretation
post-operatively.
Radiation exposure index: 46 seconds.

[Series 1: run · 2 of 2 slices shown]
[im 1/2]
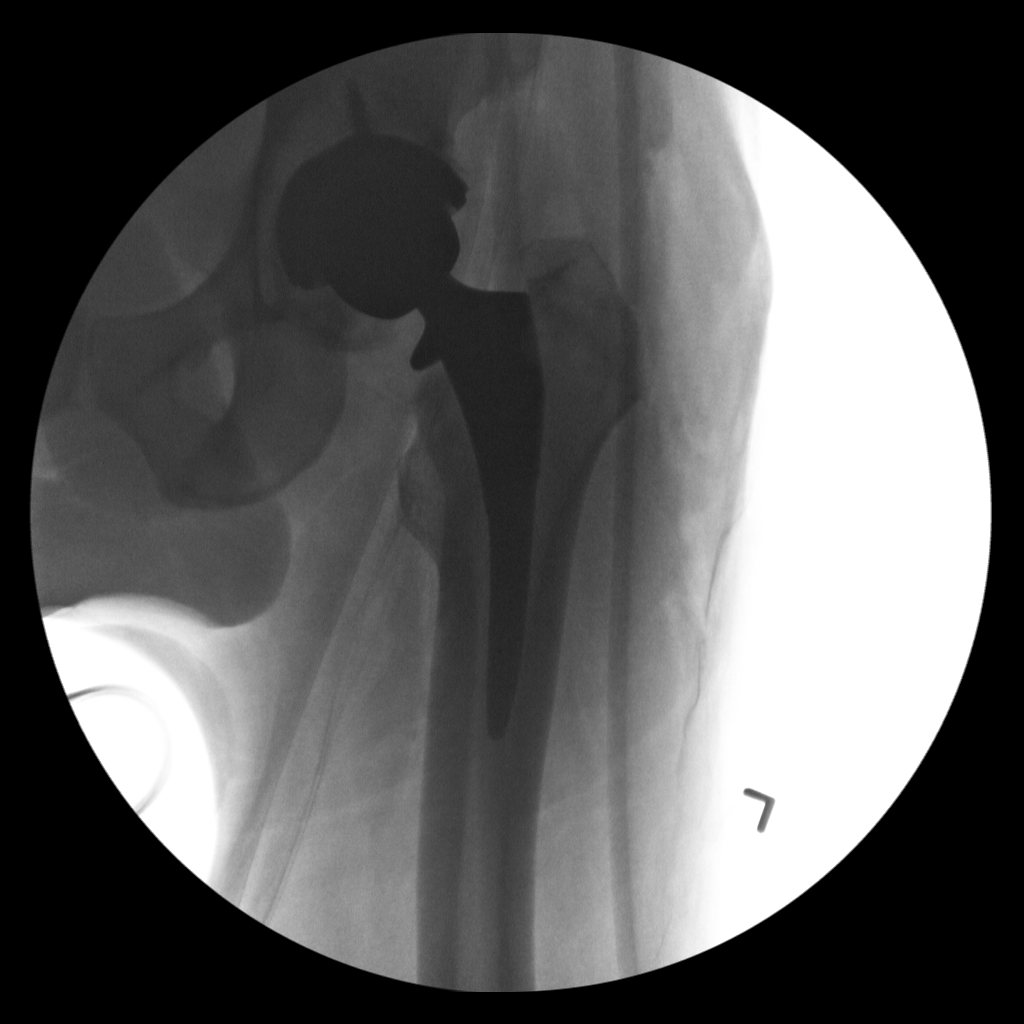
[im 2/2]
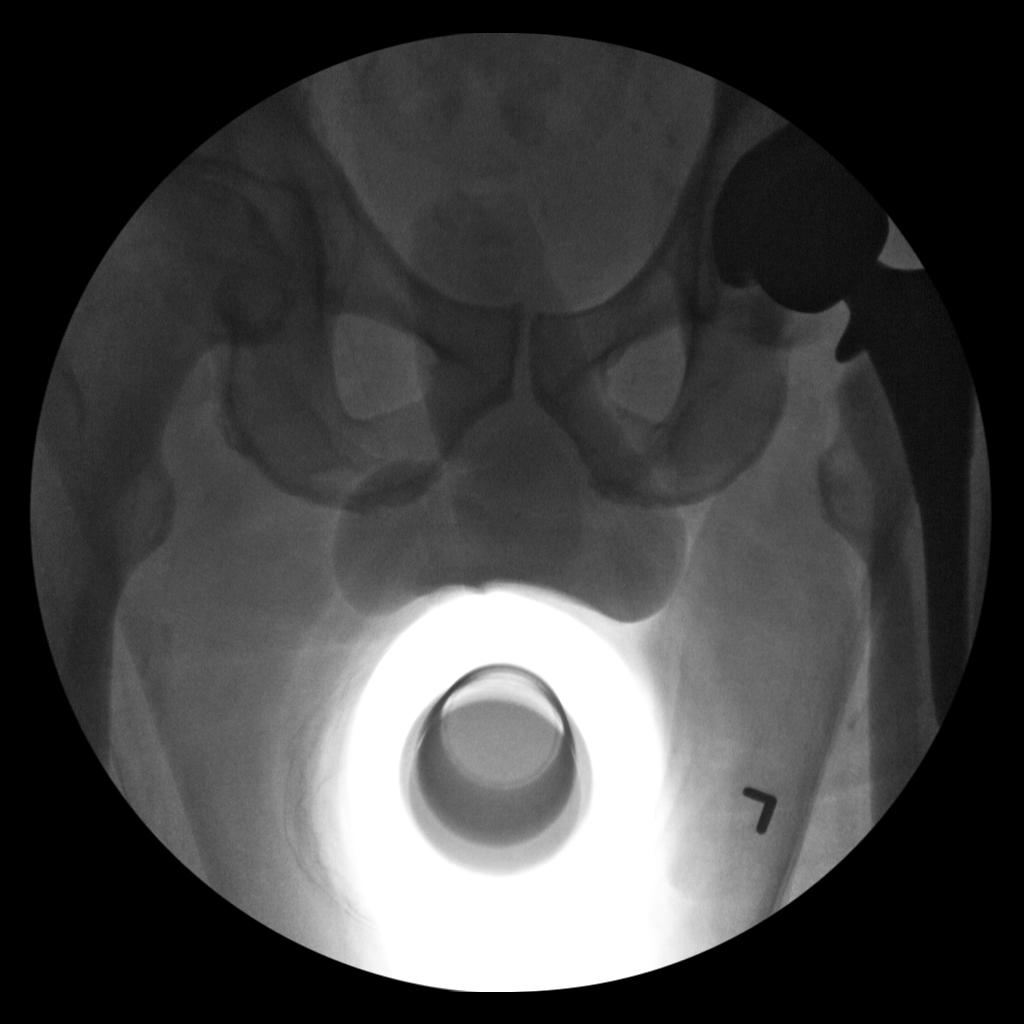

[2 of 2 positions shown; findings below may reference images not displayed]

FINDINGS: The left acetabular and femoral components appear to be well
situated. Expected postoperative changes are seen in the surrounding
soft tissues.
IMPRESSION: Fluoroscopic guidance provided during left total hip arthroplasty.

## 2020-09-03 IMAGING — CT CT HEAD W/O CM
4 series · 16 of 47 positions shown, 18 images · non-contrast
Comparison: None.

CLINICAL DATA: Unsure why he is hereAltered level of consciousness
(LOC), unexplained AMS, fever

EXAM:
CT HEAD WITHOUT CONTRAST
TECHNIQUE: Contiguous axial images were obtained from the base of the skull
through the vertex without intravenous contrast.

[Series 3: head without · axial · non-contrast · 0.44mm/px · z∈[-130,-30]mm · 6 of 30 slices shown, 8 images]
[im 5/30  brain]
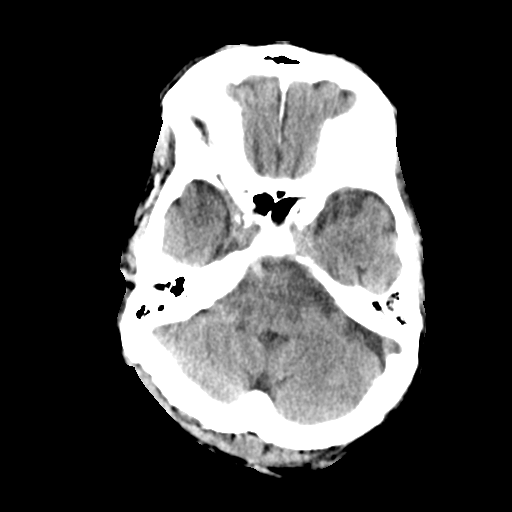
[im 5/30  bone]
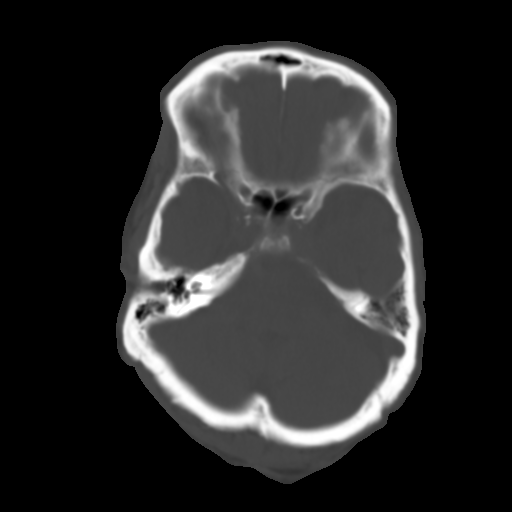
[im 9/30  brain]
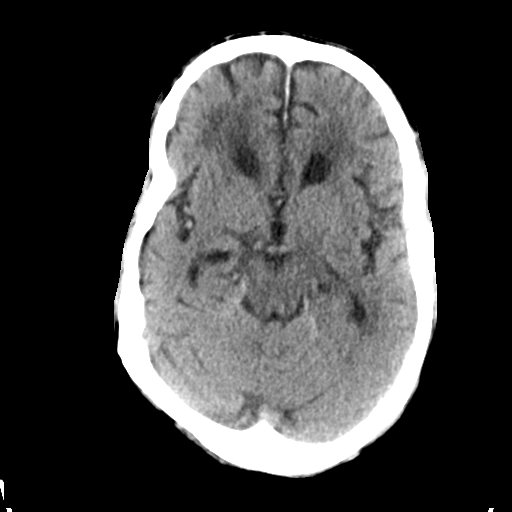
[im 13/30  brain]
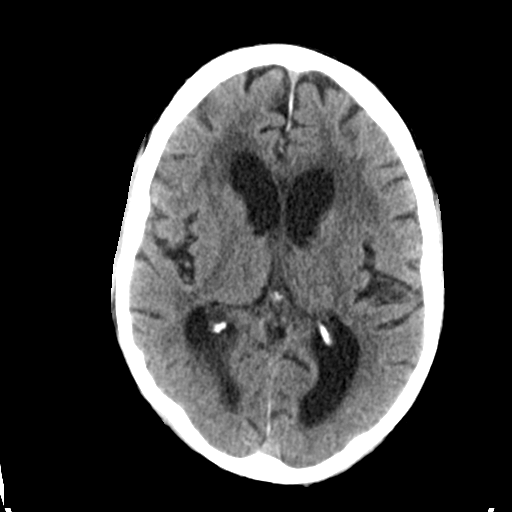
[im 17/30  brain]
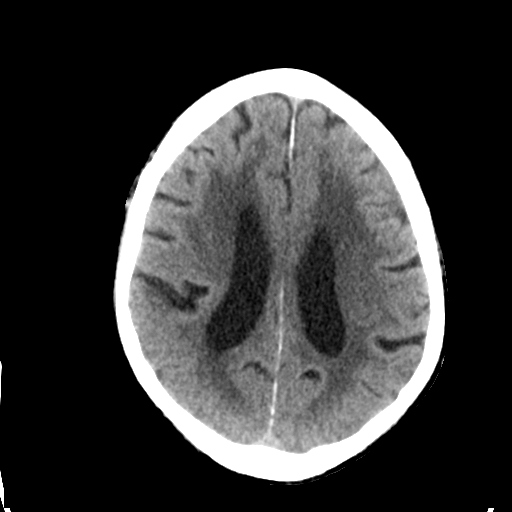
[im 21/30  brain]
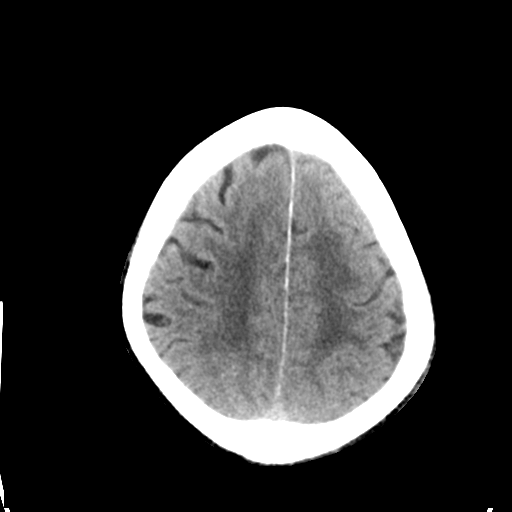
[im 21/30  bone]
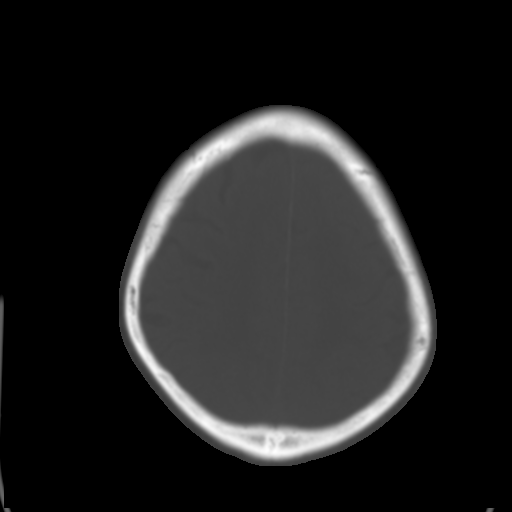
[im 25/30  brain]
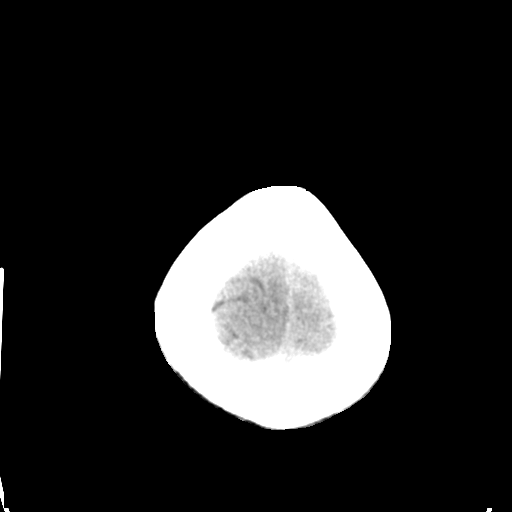

[Series 4: head bone · axial · 0.44mm/px · z∈[-137,-87]mm · 4 of 76 slices shown]
[im 8/76  bone]
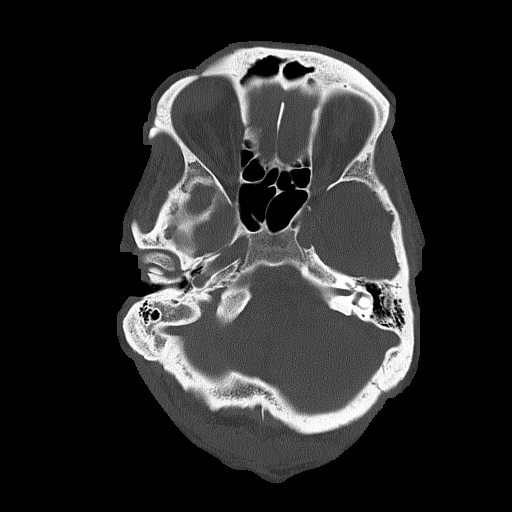
[im 15/76  bone]
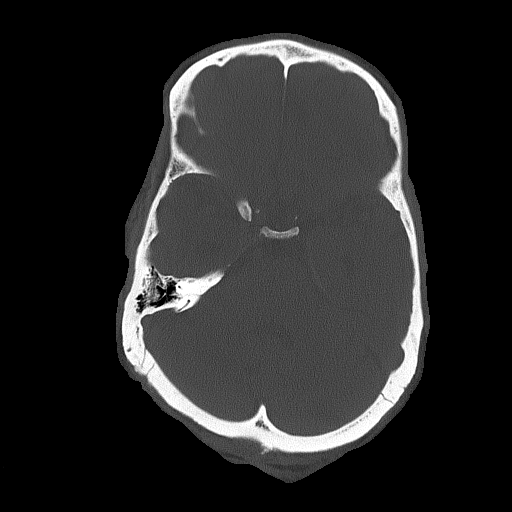
[im 26/76  bone]
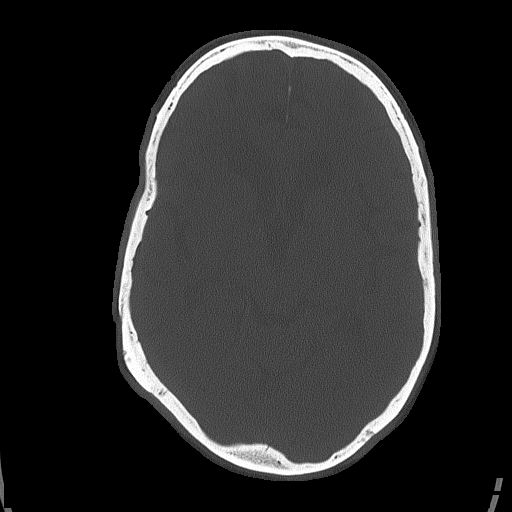
[im 33/76  bone]
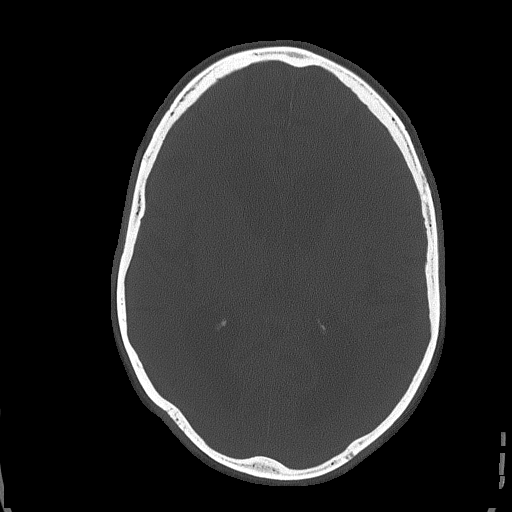

[Series 5: head without cor · coronal · non-contrast · 0.31mm/px · 3 of 72 slices shown]
[im 24/72  brain]
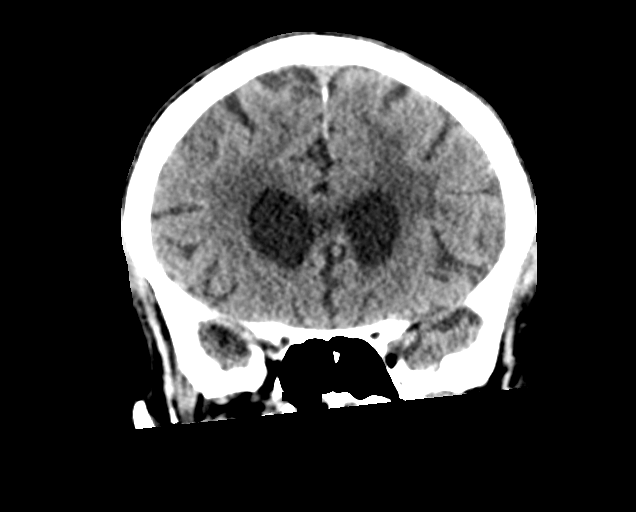
[im 32/72  brain]
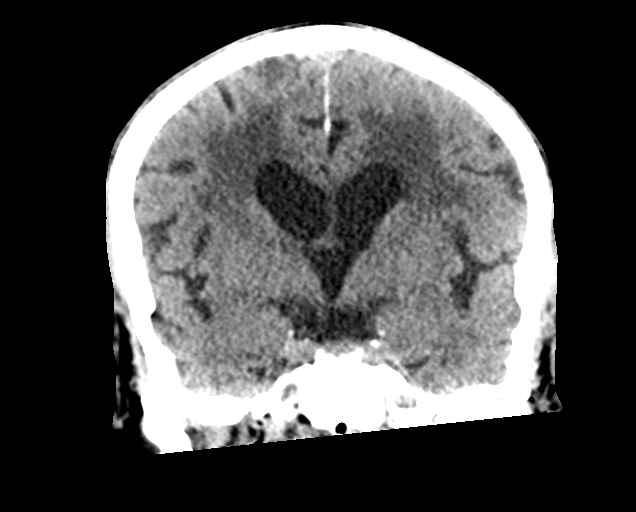
[im 40/72  brain]
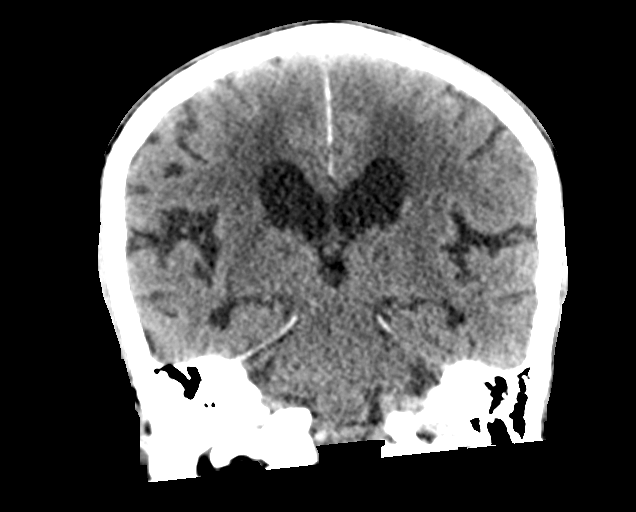

[Series 6: head without sag · sagittal · non-contrast · 0.36mm/px · 3 of 67 slices shown]
[im 23/67  brain]
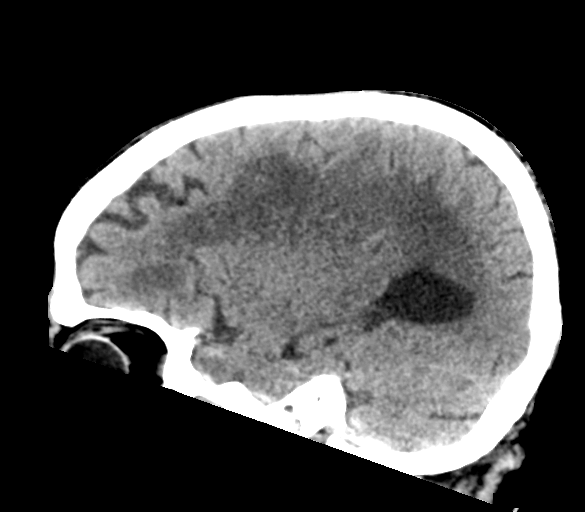
[im 34/67  brain]
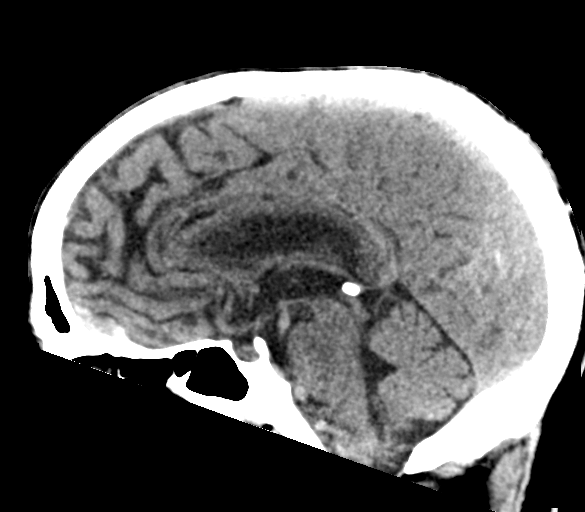
[im 45/67  brain]
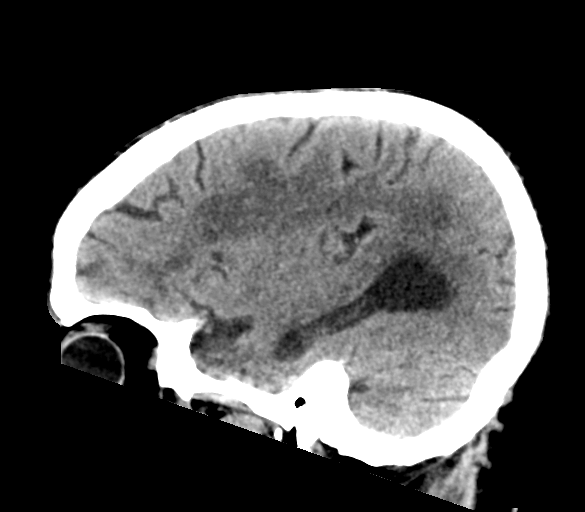

[16 of 47 positions shown; findings below may reference images not displayed]

FINDINGS: Brain: No acute intracranial hemorrhage. No focal mass lesion. No CT
evidence of acute infarction. No midline shift or mass effect. No
hydrocephalus. Basilar cisterns are patent.

There are periventricular and subcortical white matter
hypodensities. Generalized cortical atrophy.

Vascular: No hyperdense vessel or unexpected calcification.

Skull: Normal. Negative for fracture or focal lesion.

Sinuses/Orbits: Paranasal sinuses and mastoid air cells are clear.
Orbits are clear.

Other: None.
IMPRESSION: 1. No acute intracranial findings.
2. Atrophy and white matter microvascular disease.

## 2020-09-18 DIAGNOSIS — Z23 Encounter for immunization: Secondary | ICD-10-CM | POA: Diagnosis not present

## 2020-09-19 ENCOUNTER — Telehealth: Payer: Self-pay | Admitting: *Deleted

## 2020-09-19 DIAGNOSIS — Z961 Presence of intraocular lens: Secondary | ICD-10-CM | POA: Diagnosis not present

## 2020-09-19 NOTE — Telephone Encounter (Signed)
Attempted 1 year Ortho bundle call; no answer and attempted to leave message on home number. Will try again.

## 2020-09-25 ENCOUNTER — Telehealth: Payer: Self-pay | Admitting: *Deleted

## 2020-09-25 NOTE — Telephone Encounter (Signed)
Ortho bundle 1 year call attempted x 2. Left message and requested call back.

## 2020-11-07 ENCOUNTER — Telehealth: Payer: Self-pay | Admitting: Interventional Cardiology

## 2020-11-07 MED ORDER — APIXABAN 5 MG PO TABS: 5.0000 mg | ORAL_TABLET | Freq: Two times a day (BID) | ORAL | 5 refills | Status: DC

## 2020-11-07 NOTE — Telephone Encounter (Signed)
New Message  *STAT* If patient is at the pharmacy, call can be transferred to refill team.  1. Which medications need to be refilled? (please list name of each medication and dose if known) apixaban (ELIQUIS) 5 MG TABS tablet  2. Which pharmacy/location (including street and city if local pharmacy) is medication to be sent to? [place], [place] [address]  3. Do they need a 30 day or 90 day supply? [place]

## 2020-11-07 NOTE — Telephone Encounter (Signed)
Prescription refill request for Eliquis received. Indication:afib Last office visit:12/14/19 Scr:1.1 on 05/24/20 Age: 79 Weight: 78.5

## 2020-12-20 DIAGNOSIS — L602 Onychogryphosis: Secondary | ICD-10-CM | POA: Diagnosis not present

## 2020-12-20 DIAGNOSIS — E1351 Other specified diabetes mellitus with diabetic peripheral angiopathy without gangrene: Secondary | ICD-10-CM | POA: Diagnosis not present

## 2020-12-20 DIAGNOSIS — L84 Corns and callosities: Secondary | ICD-10-CM | POA: Diagnosis not present

## 2021-01-15 DIAGNOSIS — I491 Atrial premature depolarization: Secondary | ICD-10-CM | POA: Diagnosis not present

## 2021-01-15 DIAGNOSIS — Z008 Encounter for other general examination: Secondary | ICD-10-CM | POA: Diagnosis not present

## 2021-01-15 DIAGNOSIS — E663 Overweight: Secondary | ICD-10-CM | POA: Diagnosis not present

## 2021-01-15 DIAGNOSIS — E1165 Type 2 diabetes mellitus with hyperglycemia: Secondary | ICD-10-CM | POA: Diagnosis not present

## 2021-01-15 DIAGNOSIS — I82403 Acute embolism and thrombosis of unspecified deep veins of lower extremity, bilateral: Secondary | ICD-10-CM | POA: Diagnosis not present

## 2021-01-15 DIAGNOSIS — Z0000 Encounter for general adult medical examination without abnormal findings: Secondary | ICD-10-CM | POA: Diagnosis not present

## 2021-01-15 DIAGNOSIS — I10 Essential (primary) hypertension: Secondary | ICD-10-CM | POA: Diagnosis not present

## 2021-01-15 DIAGNOSIS — M009 Pyogenic arthritis, unspecified: Secondary | ICD-10-CM | POA: Diagnosis not present

## 2021-01-15 DIAGNOSIS — I480 Paroxysmal atrial fibrillation: Secondary | ICD-10-CM | POA: Diagnosis not present

## 2021-01-15 DIAGNOSIS — Z6825 Body mass index (BMI) 25.0-25.9, adult: Secondary | ICD-10-CM | POA: Diagnosis not present

## 2021-01-15 DIAGNOSIS — E785 Hyperlipidemia, unspecified: Secondary | ICD-10-CM | POA: Diagnosis not present

## 2021-02-01 ENCOUNTER — Other Ambulatory Visit: Payer: Self-pay | Admitting: Interventional Cardiology

## 2021-02-01 NOTE — Telephone Encounter (Addendum)
Prescription refill request for Eliquis received. Indication: Afib Last office visit: [name] 12/14/2019 Scr: 1.1, 05/24/2020, [place] [place] Age: 80 yo  Weight: 78.5 kg   Pt is on the correct dose of Eliquis per dosing criteria, Prescrition refill sent for Eliquis  5 mg BID.

## 2021-02-15 NOTE — Progress Notes (Addendum)
Cardiology Office Note:    Date:  02/16/2021   ID:  [name], DOB December 18, 1940, MRN [id number]  PCP:  [name], [place]  Cardiologist:  Brandon Grooms, MD    Electrophysiologist:  None       Referring MD: Brandon Lass, MD   Chief Complaint:  Follow-up (AFib)    Patient Profile:     [name] is a 80 y.o. 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 Occasional episodes during waking hours (Mobitz 1 second-degree AV block).  Atrial fibrillation and atrial flutter noted intermittently. Fair rate control with heart rates generally less than 100. Occasional heart rates greater than or equal to 130.  Echocardiogram 11/03/2019 EF 60-65, GR 1 DD, no RWMA, normal RVSF, mild MR, trivial TR, trivial AI, RVSP 35.3  Myoview 03/16/14 Low risk', no ischemia, EF 73    History of Present Illness:    [name] was last seen by [name] in 1/21.  He returns for f/u.  He is here with his wife. He stopped taking medications for diabetes and cholesterol years ago.  This is managed by his PCP.  His A1c in 7/21 was 6.8.  He feels better off of these medications.  He has continued to take Apixaban.  He has not had exertional chest pain or shortness of breath.  He sometimes has a brief (< 1 sec) sensation in his chest.  This occurs at rest.  It is not getting any worse and he has no associated symptoms.  He has not had orthopnea, leg edema, syncope.           Past Medical History:  Diagnosis Date  . Arthritis   . Attention deficit disorder   . Blind left eye   . Diabetes mellitus without complication ([name])   . Hyperlipidemia   . Hypertension      Current Medications: Current Meds  Medication Sig  . ELIQUIS 5 MG TABS tablet TAKE 1 TABLET BY MOUTH TWO TIMES A DAY     Allergies:   Patient has no known allergies.   Social History   Tobacco Use  . Smoking status: Former Research scientist (life sciences)  . Smokeless tobacco: Never Used  Vaping Use  . Vaping Use: Never used  Substance Use Topics  . Alcohol use: Yes    Comment: occasional   . Drug use: No     Family Hx: The patient's family history includes Heart attack in his brother, father, and sister.  Review of Systems  Respiratory: Negative for hemoptysis.   Gastrointestinal: Negative for hematochezia and melena.  Genitourinary: Negative for hematuria.     EKGs/Labs/Other Test Reviewed:    EKG:  EKG is   ordered today.  The ekg ordered today demonstrates NSR, HR 81, normal axis, increased artifact, no ST-TW changes, QTc 425  Recent Labs: No results found for requested labs within last 8760 hours.   Recent Lipid Panel Lab Results  Component Value Date/Time   CHOL 166 09/15/2019 03:23 AM   TRIG 33 09/15/2019 03:23 AM   HDL 78 09/15/2019 03:23 AM   CHOLHDL 2.1 09/15/2019 03:23 AM   LDLCALC 81 09/15/2019 03:23 AM  Labs obtained through [facility] - personally reviewed and interpreted: Hemoglobin 14.700 g/d 01/24/2020 Creatinine, Serum 0.990 mg/ 01/24/2020 A1C 6.800 mg/ 01/24/2020  Labs obtained through [facility]  - personally reviewed and interpreted: 05/24/20: Hgb 14.6, K 4.3, SCr 1.1   Risk Assessment/Calculations:    CHA2DS2-VASc Score = 5  This indicates a 7.2% annual risk of stroke. The patient's score is based upon: CHF History: No HTN History: Yes Diabetes History: Yes Stroke History: No Vascular Disease History: Yes Age Score: 2 Gender Score: 0     Physical Exam:    VS:  BP 110/60   Pulse 81   Ht 5\' 8"  (1.727 m)   Wt 173 lb 3.2 oz (78.6 kg)   SpO2 94%   BMI 26.33 kg/m     Wt Readings from Last 3 Encounters:  02/16/21 173 lb 3.2 oz (78.6 kg)   12/14/19 173 lb 1.9 oz (78.5 kg)  10/12/19 170 lb (77.1 kg)     Constitutional:      Appearance: Healthy appearance. Not in distress.  Neck:     Thyroid: No thyromegaly.     Vascular: JVD normal.  Pulmonary:     Effort: Pulmonary effort is normal.     Breath sounds: No wheezing. No rales.  Cardiovascular:     Normal rate. Regular rhythm. Normal S1. Normal S2.     Murmurs: There is no murmur.  Edema:    Peripheral edema absent.  Abdominal:     Palpations: Abdomen is soft.  Skin:    General: Skin is warm and dry.  Neurological:     General: No focal deficit present.     Mental Status: Alert and oriented to person, place and time.     Cranial Nerves: Cranial nerves are intact.         ASSESSMENT & PLAN:    1. PAF (paroxysmal atrial fibrillation) (HCC) CHA2DS2-VASc Score = 5 [CHF History: No, HTN History: Yes, Diabetes History: Yes, Stroke History: No, Vascular Disease History: Yes, Age Score: 2, Gender Score: 0].  Therefore, the patient's annual risk of stroke is 7.2 %.  He is maintaining normal sinus rhythm.  He is tolerating anticoagulation with Apixaban.  Continue current dose of 5 mg twice daily (age < 75, wt > 60 kg, SCr < 1.5).  We discussed the importance of continuing anticoagulation to reduce risk of stroke.  Check f/u BMET, CBC today. He has had some chest symptoms that are non-cardiac.  I have asked him to contact us for sooner f/u if this symptom should change any.  I also asked him to f/u with primary care for management of his diabetes and hyperlipidemia.        Dispo:  Return in about 1 year (around 02/16/2022) for Routine Follow Up w/ [name].   Medication Adjustments/Labs and Tests Ordered: Current medicines are reviewed at length with the patient today.  Concerns regarding medicines are outlined above.  Tests Ordered: Orders Placed This Encounter  Procedures  . Basic metabolic panel  . CBC  . EKG 12-Lead   Medication Changes: No orders of the defined  types were placed in this encounter.   Signed, Brandon Dopp, PA-C  02/16/2021 10:47 AM    [facility] [address], [place], [place]  [address] Phone: [phone]; Fax: [phone]

## 2021-02-16 ENCOUNTER — Ambulatory Visit (INDEPENDENT_AMBULATORY_CARE_PROVIDER_SITE_OTHER): Payer: Medicare Other | Admitting: Physician Assistant

## 2021-02-16 ENCOUNTER — Encounter: Payer: Self-pay | Admitting: Physician Assistant

## 2021-02-16 ENCOUNTER — Other Ambulatory Visit: Payer: Self-pay

## 2021-02-16 VITALS — BP 110/60 | HR 81 | Ht 68.0 in | Wt 173.2 lb

## 2021-02-16 DIAGNOSIS — I10 Essential (primary) hypertension: Secondary | ICD-10-CM

## 2021-02-16 DIAGNOSIS — I2510 Atherosclerotic heart disease of native coronary artery without angina pectoris: Secondary | ICD-10-CM

## 2021-02-16 DIAGNOSIS — E782 Mixed hyperlipidemia: Secondary | ICD-10-CM

## 2021-02-16 DIAGNOSIS — I480 Paroxysmal atrial fibrillation: Secondary | ICD-10-CM

## 2021-02-16 LAB — CBC
Hematocrit: 44.1 % (ref 37.5–51.0)
Hemoglobin: 15.2 g/dL (ref 13.0–17.7)
MCH: 30.3 pg (ref 26.6–33.0)
MCHC: 34.5 g/dL (ref 31.5–35.7)
MCV: 88 fL (ref 79–97)
Platelets: 262 10*3/uL (ref 150–450)
RBC: 5.02 x10E6/uL (ref 4.14–5.80)
RDW: 11.8 % (ref 11.6–15.4)
WBC: 4.7 10*3/uL (ref 3.4–10.8)

## 2021-02-16 LAB — BASIC METABOLIC PANEL
BUN/Creatinine Ratio: 13 (ref 10–24)
BUN: 16 mg/dL (ref 8–27)
CO2: 22 mmol/L (ref 20–29)
Calcium: 9.8 mg/dL (ref 8.6–10.2)
Chloride: 100 mmol/L (ref 96–106)
Creatinine, Ser: 1.19 mg/dL (ref 0.76–1.27)
Glucose: 121 mg/dL — ABNORMAL HIGH (ref 65–99)
Potassium: 4.4 mmol/L (ref 3.5–5.2)
Sodium: 137 mmol/L (ref 134–144)
eGFR: 62 mL/min/{1.73_m2} (ref >59)

## 2021-02-16 NOTE — Patient Instructions (Addendum)
Medication Instructions:  Your physician recommends that you continue on your current medications as directed. Please refer to the Current Medication list given to you today.  *If you need a refill on your cardiac medications before your next appointment, please call your pharmacy*   Lab Work: TODAY:  BMET & CBC  If you have labs (blood work) drawn today and your tests are completely normal, you will receive your results only by: Marland Kitchen MyChart Message (if you have MyChart) OR . A paper copy in the mail If you have any lab test that is abnormal or we need to change your treatment, we will call you to review the results.   Testing/Procedures: None ordered   Follow-Up: At [facility], you and your health needs are our priority.  As part of our continuing mission to provide you with exceptional heart care, we have created designated Provider Care Teams.  These Care Teams include your primary Cardiologist (physician) and Advanced Practice Providers (APPs -  Physician Assistants and Nurse Practitioners) who all work together to provide you with the care you need, when you need it.  We recommend signing up for the patient portal called "MyChart".  Sign up information is provided on this After Visit Summary.  MyChart is used to connect with patients for Virtual Visits (Telemedicine).  Patients are able to view lab/test results, encounter notes, upcoming appointments, etc.  Non-urgent messages can be sent to your provider as well.   To learn more about what you can do with MyChart, go to [email or web address].    Your next appointment:   12 month(s)  The format for your next appointment:   In Person  Provider:   You may see Sinclair Grooms, MD or one of the following Advanced Practice Providers on your designated Care Team:    Kathyrn Drown, NP    Other Instructions

## 2021-03-20 DIAGNOSIS — H5211 Myopia, right eye: Secondary | ICD-10-CM | POA: Diagnosis not present

## 2021-03-20 DIAGNOSIS — D23121 Other benign neoplasm of skin of left upper eyelid, including canthus: Secondary | ICD-10-CM | POA: Diagnosis not present

## 2021-03-20 DIAGNOSIS — E119 Type 2 diabetes mellitus without complications: Secondary | ICD-10-CM | POA: Diagnosis not present

## 2021-03-20 DIAGNOSIS — H26491 Other secondary cataract, right eye: Secondary | ICD-10-CM | POA: Diagnosis not present

## 2021-03-26 DIAGNOSIS — D23121 Other benign neoplasm of skin of left upper eyelid, including canthus: Secondary | ICD-10-CM | POA: Diagnosis not present

## 2021-04-30 ENCOUNTER — Other Ambulatory Visit: Payer: Self-pay | Admitting: Interventional Cardiology

## 2021-04-30 NOTE — Telephone Encounter (Addendum)
Eliquis refill was sent on 02/16/2021 for 30 tabs and 1 refill to the same requesting pharmacy. This was not enough pills for a month as 60 day is a 1 month supply. Will process.   Eliquis 5mg  refill request received. Patient is 80 years old, weight-78.6kg, Crea-1.19 on 02/16/2021, Diagnosis-Afib, and last seen by Richardson Dopp on 02/16/2021. Dose is appropriate based on dosing criteria. Will send in refill to requested pharmacy.

## 2021-06-07 DIAGNOSIS — Z20822 Contact with and (suspected) exposure to covid-19: Secondary | ICD-10-CM | POA: Diagnosis not present

## 2021-06-12 DIAGNOSIS — Z20822 Contact with and (suspected) exposure to covid-19: Secondary | ICD-10-CM | POA: Diagnosis not present

## 2021-06-18 DIAGNOSIS — Z20822 Contact with and (suspected) exposure to covid-19: Secondary | ICD-10-CM | POA: Diagnosis not present

## 2021-08-07 DIAGNOSIS — Z20822 Contact with and (suspected) exposure to covid-19: Secondary | ICD-10-CM | POA: Diagnosis not present

## 2021-09-10 DIAGNOSIS — Z20822 Contact with and (suspected) exposure to covid-19: Secondary | ICD-10-CM | POA: Diagnosis not present

## 2021-09-27 DIAGNOSIS — Z20822 Contact with and (suspected) exposure to covid-19: Secondary | ICD-10-CM | POA: Diagnosis not present

## 2021-10-26 DIAGNOSIS — J208 Acute bronchitis due to other specified organisms: Secondary | ICD-10-CM | POA: Diagnosis not present

## 2021-10-26 DIAGNOSIS — R350 Frequency of micturition: Secondary | ICD-10-CM | POA: Diagnosis not present

## 2021-10-26 DIAGNOSIS — N390 Urinary tract infection, site not specified: Secondary | ICD-10-CM | POA: Diagnosis not present

## 2021-10-30 ENCOUNTER — Other Ambulatory Visit: Payer: Self-pay | Admitting: Interventional Cardiology

## 2021-10-30 DIAGNOSIS — I480 Paroxysmal atrial fibrillation: Secondary | ICD-10-CM

## 2021-10-30 NOTE — Telephone Encounter (Signed)
Eliquis 5mg  refill request received. Patient is 80 years old, weight-78.6kg, Crea-1.19 on 02/16/2021, Diagnosis-Afib, and last seen by Richardson Dopp on 02/16/2021. Dose is appropriate based on dosing criteria. Will send in refill to requested pharmacy.

## 2021-11-06 DIAGNOSIS — Z20822 Contact with and (suspected) exposure to covid-19: Secondary | ICD-10-CM | POA: Diagnosis not present

## 2021-12-17 DIAGNOSIS — Z20822 Contact with and (suspected) exposure to covid-19: Secondary | ICD-10-CM | POA: Diagnosis not present

## 2021-12-31 ENCOUNTER — Telehealth: Payer: Self-pay | Admitting: Interventional Cardiology

## 2021-12-31 NOTE — Telephone Encounter (Signed)
Patient calling the office for samples of medication:   1.  What medication and dosage are you requesting samples for? ELIQUIS 5 MG TABS tablet  2.  Are you currently out of this medication? No will be out by Friday   Patient's wife states the patient's medication will cost over $500. She would like to know if he can get some samples until he meets his deductible.

## 2022-01-01 NOTE — Telephone Encounter (Signed)
Can provide pt with 1 week of samples but he will need to pay through his deductible eventually. His deductible resets every January 1 and Eliquis copay does not decrease to typical ~$45/month copay until deductible is paid through. Pt assistance is also not an option yet as [employer] only covers patients once they have spent at least 3-4% of their annual income on out of pocket med costs and it is only January. If paying through the deductible will be a long term issue, changing to warfarin may be a better option moving forward.

## 2022-01-01 NOTE — Telephone Encounter (Signed)
Leaving pt 1 box of Eliquis 5 mg tablet at the front desk for pt to pick up.  Lot# [id number]  Exp: 01/2024

## 2022-01-03 DIAGNOSIS — Z20822 Contact with and (suspected) exposure to covid-19: Secondary | ICD-10-CM | POA: Diagnosis not present

## 2022-01-14 DIAGNOSIS — Z23 Encounter for immunization: Secondary | ICD-10-CM | POA: Diagnosis not present

## 2022-01-16 DIAGNOSIS — Z20822 Contact with and (suspected) exposure to covid-19: Secondary | ICD-10-CM | POA: Diagnosis not present

## 2022-01-22 DIAGNOSIS — R059 Cough, unspecified: Secondary | ICD-10-CM | POA: Diagnosis not present

## 2022-01-22 DIAGNOSIS — Z20822 Contact with and (suspected) exposure to covid-19: Secondary | ICD-10-CM | POA: Diagnosis not present

## 2022-01-22 DIAGNOSIS — R051 Acute cough: Secondary | ICD-10-CM | POA: Diagnosis not present

## 2022-01-29 DIAGNOSIS — Z20822 Contact with and (suspected) exposure to covid-19: Secondary | ICD-10-CM | POA: Diagnosis not present

## 2022-01-30 DIAGNOSIS — Z20828 Contact with and (suspected) exposure to other viral communicable diseases: Secondary | ICD-10-CM | POA: Diagnosis not present

## 2022-02-04 DIAGNOSIS — Z23 Encounter for immunization: Secondary | ICD-10-CM | POA: Diagnosis not present

## 2022-02-10 DIAGNOSIS — Z23 Encounter for immunization: Secondary | ICD-10-CM | POA: Diagnosis not present

## 2022-02-21 DIAGNOSIS — Z20822 Contact with and (suspected) exposure to covid-19: Secondary | ICD-10-CM | POA: Diagnosis not present

## 2022-02-25 DIAGNOSIS — Z20822 Contact with and (suspected) exposure to covid-19: Secondary | ICD-10-CM | POA: Diagnosis not present

## 2022-03-01 DIAGNOSIS — Z20828 Contact with and (suspected) exposure to other viral communicable diseases: Secondary | ICD-10-CM | POA: Diagnosis not present

## 2022-03-04 DIAGNOSIS — Z20822 Contact with and (suspected) exposure to covid-19: Secondary | ICD-10-CM | POA: Diagnosis not present

## 2022-03-12 DIAGNOSIS — R350 Frequency of micturition: Secondary | ICD-10-CM | POA: Diagnosis not present

## 2022-03-12 DIAGNOSIS — E1169 Type 2 diabetes mellitus with other specified complication: Secondary | ICD-10-CM | POA: Diagnosis not present

## 2022-03-12 DIAGNOSIS — R634 Abnormal weight loss: Secondary | ICD-10-CM | POA: Diagnosis not present

## 2022-03-12 DIAGNOSIS — H6123 Impacted cerumen, bilateral: Secondary | ICD-10-CM | POA: Diagnosis not present

## 2022-03-12 DIAGNOSIS — R1013 Epigastric pain: Secondary | ICD-10-CM | POA: Diagnosis not present

## 2022-03-12 DIAGNOSIS — R413 Other amnesia: Secondary | ICD-10-CM | POA: Diagnosis not present

## 2022-03-12 DIAGNOSIS — I480 Paroxysmal atrial fibrillation: Secondary | ICD-10-CM | POA: Diagnosis not present

## 2022-03-13 ENCOUNTER — Other Ambulatory Visit: Payer: Self-pay | Admitting: Family Medicine

## 2022-03-13 DIAGNOSIS — Z23 Encounter for immunization: Secondary | ICD-10-CM | POA: Diagnosis not present

## 2022-03-13 DIAGNOSIS — R1013 Epigastric pain: Secondary | ICD-10-CM

## 2022-03-15 DIAGNOSIS — Z20822 Contact with and (suspected) exposure to covid-19: Secondary | ICD-10-CM | POA: Diagnosis not present

## 2022-03-18 DIAGNOSIS — Z20822 Contact with and (suspected) exposure to covid-19: Secondary | ICD-10-CM | POA: Diagnosis not present

## 2022-03-19 DIAGNOSIS — H6123 Impacted cerumen, bilateral: Secondary | ICD-10-CM | POA: Diagnosis not present

## 2022-03-22 DIAGNOSIS — Z23 Encounter for immunization: Secondary | ICD-10-CM | POA: Diagnosis not present

## 2022-03-27 DIAGNOSIS — Z20822 Contact with and (suspected) exposure to covid-19: Secondary | ICD-10-CM | POA: Diagnosis not present

## 2022-03-28 DIAGNOSIS — Z20822 Contact with and (suspected) exposure to covid-19: Secondary | ICD-10-CM | POA: Diagnosis not present

## 2022-04-01 DIAGNOSIS — R82998 Other abnormal findings in urine: Secondary | ICD-10-CM | POA: Diagnosis not present

## 2022-04-12 ENCOUNTER — Ambulatory Visit
Admission: RE | Admit: 2022-04-12 | Discharge: 2022-04-12 | Disposition: A | Discharge status: Home / Self Care | Payer: Medicare Other | Source: Ambulatory Visit | Attending: Family Medicine | Admitting: Family Medicine

## 2022-04-12 DIAGNOSIS — R1013 Epigastric pain: Secondary | ICD-10-CM

## 2022-04-12 DIAGNOSIS — R634 Abnormal weight loss: Secondary | ICD-10-CM | POA: Diagnosis not present

## 2022-04-12 DIAGNOSIS — N281 Cyst of kidney, acquired: Secondary | ICD-10-CM | POA: Diagnosis not present

## 2022-04-12 DIAGNOSIS — M47816 Spondylosis without myelopathy or radiculopathy, lumbar region: Secondary | ICD-10-CM | POA: Diagnosis not present

## 2022-04-12 DIAGNOSIS — K828 Other specified diseases of gallbladder: Secondary | ICD-10-CM | POA: Diagnosis not present

## 2022-04-12 MED ORDER — IOPAMIDOL (ISOVUE-300) INJECTION 61%
100.0000 mL | Freq: Once | INTRAVENOUS | Status: AC | PRN
  Administered 2022-04-12: 100 mL via INTRAVENOUS

## 2022-04-16 NOTE — Progress Notes (Incomplete)
Cardiology Office Note:    Date:  04/16/2022   ID:  [name], DOB 02-12-1941, MRN [id number]  PCP:  Kathyrn Lass, MD  Cardiologist:  Sinclair Grooms, MD   Referring MD: Kathyrn Lass, MD   No chief complaint on file.   History of Present Illness:    [name] is a 81 y.o. male with a hx of  PAF(CHADS VASC 5), DM II, coronary artery calcification (non-coronary CT), primary hypertension, PAC's with block, blind left eye, and attention deficit disorder.   ***  Past Medical History:  Diagnosis Date   Arthritis    Attention deficit disorder    Blind left eye    Diabetes mellitus without complication ([place])    Hyperlipidemia    Hypertension     Past Surgical History:  Procedure Laterality Date   EYE SURGERY Left    x3   KNEE ARTHROSCOPY Bilateral 03/29/2014   Procedure: RIGHT KNEE ARTHROSCOPY AND DEBRIDEMENT, LEFT KNEE ASPIRATION,  LEFT KNEE ARTHROSCOPY AND DEBRIDEMENT.;  Surgeon: Meredith Pel, MD;  Location: [facility];  Service: Orthopedics;  Laterality: Bilateral;  RIGHT KNEE ARTHROSCOPY AND DEBRIDEMENT, LEFT KNEE ASPIRATION,  LEFT KNEE ARTHROSCOPY AND DEBRIDEMENT.   KNEE ARTHROTOMY Bilateral 04/26/2014   Procedure: KNEE ARTHROTOMY, SYNOVECTOMY, ANTIBIOTIC BEAD PLACEMENT.;  Surgeon: Meredith Pel, MD;  Location: [facility];  Service: Orthopedics;  Laterality: Bilateral;   lipoma removal, right upper back     TONSILLECTOMY     TOTAL HIP ARTHROPLASTY Left 09/14/2019   TOTAL HIP ARTHROPLASTY Left 09/14/2019   Procedure: LEFT TOTAL HIP ARTHROPLASTY ANTERIOR APPROACH;  Surgeon: Meredith Pel, MD;  Location: [facility];  Service: Orthopedics;  Laterality: Left;    Current Medications: No outpatient medications have been marked as taking for the 04/17/22 encounter (Appointment) with Belva Crome, MD.     Allergies:   Patient has no known allergies.   Social History   Socioeconomic History   Marital status: Married    Spouse name: Not on file   Number of  children: Not on file   Years of education: Not on file   Highest education level: Not on file  Occupational History   Not on file  Tobacco Use   Smoking status: Former   Smokeless tobacco: Never  Vaping Use   Vaping Use: Never used  Substance and Sexual Activity   Alcohol use: Yes    Comment: occasional    Drug use: No   Sexual activity: Not on file  Other Topics Concern   Not on file  Social History Narrative   Not on file   Social Determinants of Health   Financial Resource Strain: Not on file  Food Insecurity: Not on file  Transportation Needs: Not on file  Physical Activity: Not on file  Stress: Not on file  Social Connections: Not on file     Family History: The patient's family history includes Heart attack in his brother, father, and sister.  ROS:   Please see the history of present illness.    *** All other systems reviewed and are negative.  EKGs/Labs/Other Studies Reviewed:    The following studies were reviewed today: CARDIAC MONITOR 2021 Study Highlights  Normal sinus rhythm and sinus bradycardia Second-degree AV block, predominantly nocturnal and presumably during sleep. Occasional episodes during waking hours (Mobitz 1 second-degree AV block). Atrial fibrillation and atrial flutter noted intermittently. Fair rate control with heart rates generally less than 100. Occasional heart rates greater than or equal  to 130.  EKG:  EKG ***  Recent Labs: No results found for requested labs within last 8760 hours.  Recent Lipid Panel    Component Value Date/Time   CHOL 166 09/15/2019 0323   TRIG 33 09/15/2019 0323   HDL 78 09/15/2019 0323   CHOLHDL 2.1 09/15/2019 0323   VLDL 7 09/15/2019 0323   LDLCALC 81 09/15/2019 0323    Physical Exam:    VS:  There were no vitals taken for this visit.    Wt Readings from Last 3 Encounters:  02/16/21 173 lb 3.2 oz (78.6 kg)  12/14/19 173 lb 1.9 oz (78.5 kg)  10/12/19 170 lb (77.1 kg)     GEN: ***. No acute  distress HEENT: Normal NECK: No JVD. LYMPHATICS: No lymphadenopathy CARDIAC: *** murmur. RRR *** gallop, or edema. VASCULAR: *** Normal Pulses. No bruits. RESPIRATORY:  Clear to auscultation without rales, wheezing or rhonchi  ABDOMEN: Soft, non-tender, non-distended, No pulsatile mass, MUSCULOSKELETAL: No deformity  SKIN: Warm and dry NEUROLOGIC:  Alert and oriented x 3 PSYCHIATRIC:  Normal affect   ASSESSMENT:    1. PAF (paroxysmal atrial fibrillation) ([place])   2. Mixed hyperlipidemia   3. Essential hypertension   4. Controlled type 2 diabetes mellitus with other circulatory complication, without long-term current use of insulin ([place])   5. PAC (premature atrial contraction)    PLAN:    In order of problems listed above:  ***   Medication Adjustments/Labs and Tests Ordered: Current medicines are reviewed at length with the patient today.  Concerns regarding medicines are outlined above.  No orders of the defined types were placed in this encounter.  No orders of the defined types were placed in this encounter.   There are no Patient Instructions on file for this visit.   Signed, Sinclair Grooms, MD  04/16/2022 12:09 PM    [facility]

## 2022-04-17 ENCOUNTER — Ambulatory Visit: Payer: Medicare Other | Admitting: Interventional Cardiology

## 2022-04-17 DIAGNOSIS — I480 Paroxysmal atrial fibrillation: Secondary | ICD-10-CM

## 2022-04-17 DIAGNOSIS — I10 Essential (primary) hypertension: Secondary | ICD-10-CM

## 2022-04-17 DIAGNOSIS — I491 Atrial premature depolarization: Secondary | ICD-10-CM

## 2022-04-17 DIAGNOSIS — E1159 Type 2 diabetes mellitus with other circulatory complications: Secondary | ICD-10-CM

## 2022-04-17 DIAGNOSIS — E782 Mixed hyperlipidemia: Secondary | ICD-10-CM

## 2022-04-24 ENCOUNTER — Telehealth: Payer: Self-pay | Admitting: Interventional Cardiology

## 2022-04-24 DIAGNOSIS — I480 Paroxysmal atrial fibrillation: Secondary | ICD-10-CM

## 2022-04-24 NOTE — Telephone Encounter (Signed)
Per Dr. Tamala Julian, he needs to hold the apixaban until bleeding stops, then resume.  He needs an appointment with Dr. Quentin Ore to determine if he would be a candidate for [facility] and then potentially be able to discontinue anticoagulation.  He should see Dr. Quentin Ore prior to seeing me.   Placed order for referral.

## 2022-04-24 NOTE — Telephone Encounter (Unsigned)
Pt c/o medication issue:  1. Name of Medication: ELIQUIS 5 MG TABS tablet  2. How are you currently taking this medication (dosage and times per day)? One tablet a day   3. Are you having a reaction (difficulty breathing--STAT)?  No   4. What is your medication issue?  Pt's POA states he has been having nosebleeds

## 2022-04-24 NOTE — Telephone Encounter (Signed)
Patient's wife calling stating patient has had several nose bleeds today. Patient is not taking eliquis twice a day at this time. Patient is only taking once daily, due to cost. Asked patient's wife if patient has been seen in the past by ENT doctor. She stated no. Patient has had several nose bleeds this year, but they usually do not happen twice in one day. Made patient an appointment to see Dr. Tamala Julian next week, since he is over due for his yearly visit. Will forward to Dr. Tamala Julian for advisement.

## 2022-05-01 ENCOUNTER — Ambulatory Visit: Payer: Medicare Other | Admitting: Interventional Cardiology

## 2022-05-17 DIAGNOSIS — Z87891 Personal history of nicotine dependence: Secondary | ICD-10-CM | POA: Diagnosis not present

## 2022-05-17 DIAGNOSIS — R413 Other amnesia: Secondary | ICD-10-CM | POA: Diagnosis not present

## 2022-05-17 DIAGNOSIS — G4701 Insomnia due to medical condition: Secondary | ICD-10-CM | POA: Diagnosis not present

## 2022-05-17 DIAGNOSIS — R634 Abnormal weight loss: Secondary | ICD-10-CM | POA: Diagnosis not present

## 2022-05-17 DIAGNOSIS — R1013 Epigastric pain: Secondary | ICD-10-CM | POA: Diagnosis not present

## 2022-06-12 NOTE — Progress Notes (Signed)
Electrophysiology Office Note:    Date:  06/13/2022   ID:  [name], DOB [birth year], MRN [id number]  PCP:  Brandon Lass, MD  [facility] Cardiologist:  Brandon Grooms, MD  [facility] Electrophysiologist:  Brandon Epley, MD   Referring MD: Brandon Crome, MD   Chief Complaint: New patient consult for Watchman  History of Present Illness:    [name] is a 81 y.o. male who presents for an evaluation for Watchman at the request of Dr. Tamala Mccormick. Their medical history includes paroxysmal atrial fibrillation, PAC with block, CAD, hypertension, hyperlipidemia, diabetes, blind left eye, attention deficit disorder, and arthritis.  He saw Dr. Tamala Mccormick 12/14/2019 and was doing well. He was clinically in atrial fibrillation but completely asymptomatic. Compliant with Eliquis.  He followed up with Richardson Dopp, PA-C on 02/16/2021. He reported minor chest discomfort that was felt to be non-cardiac. His EKG showed NSR, HR 81, normal axis, increased artifact, no ST-TW changes, QTc 425. CHA2DS2-VASc Score of 5, tolerating Eliquis.  He called the office 04/24/2022 complaining of multiple episodes of epistaxis. He was taking Eliquis once daily due to cost. He was advised to hold Eliquis until the bleeding resolves, then resume his anticoagulation. He was referred to EP for consideration of Watchman device.  Today, he is accompanied by a family member who provides most of the history. She notes that he is suffering from some dementia. They report that he has had about 6 or 7 nosebleeds. His PCP recently restarted him on the 81 mg ASA.  Every once in a while he has chest pain "like somebody hit me in the chest." This seems to be more random, it does not occur with walking. This is present in clinic today; not reproducible with palpation.  Typically he is not very active. He usually does well with going grocery shopping; he may need to hold onto a cart with prolonged walking.   He has also  been suffering from some back pain. He is scheduled to follow up with his PCP regarding his pain.  They deny any palpitations, shortness of breath, or peripheral edema. No lightheadedness, headaches, syncope, orthopnea, or PND.      Past Medical History:  Diagnosis Date   Arthritis    Attention deficit disorder    Blind left eye    Diabetes mellitus without complication ([place])    Hyperlipidemia    Hypertension     Past Surgical History:  Procedure Laterality Date   EYE SURGERY Left    x3   KNEE ARTHROSCOPY Bilateral 03/29/2014   Procedure: RIGHT KNEE ARTHROSCOPY AND DEBRIDEMENT, LEFT KNEE ASPIRATION,  LEFT KNEE ARTHROSCOPY AND DEBRIDEMENT.;  Surgeon: Brandon Pel, MD;  Location: [place];  Service: Orthopedics;  Laterality: Bilateral;  RIGHT KNEE ARTHROSCOPY AND DEBRIDEMENT, LEFT KNEE ASPIRATION,  LEFT KNEE ARTHROSCOPY AND DEBRIDEMENT.   KNEE ARTHROTOMY Bilateral 04/26/2014   Procedure: KNEE ARTHROTOMY, SYNOVECTOMY, ANTIBIOTIC BEAD PLACEMENT.;  Surgeon: Brandon Pel, MD;  Location: [place];  Service: Orthopedics;  Laterality: Bilateral;   lipoma removal, right upper back     TONSILLECTOMY     TOTAL HIP ARTHROPLASTY Left 09/14/2019   TOTAL HIP ARTHROPLASTY Left 09/14/2019   Procedure: LEFT TOTAL HIP ARTHROPLASTY ANTERIOR APPROACH;  Surgeon: Brandon Pel, MD;  Location: [place];  Service: Orthopedics;  Laterality: Left;    Current Medications: Current Meds  Medication Sig   aspirin EC 81 MG tablet Take 81 mg by mouth daily. Swallow whole.  mirtazapine (REMERON) 15 MG tablet Take 15 mg by mouth at bedtime.   omeprazole (PRILOSEC) 20 MG capsule 1 capsule 30-60 minutes before morning meal and at bedtime     Allergies:   Patient has no known allergies.   Social History   Socioeconomic History   Marital status: Married    Spouse name: Not on file   Number of children: Not on file   Years of education: Not on file   Highest education level: Not on file  Occupational  History   Not on file  Tobacco Use   Smoking status: Former   Smokeless tobacco: Never  Vaping Use   Vaping Use: Never used  Substance and Sexual Activity   Alcohol use: Yes    Comment: occasional    Drug use: No   Sexual activity: Not on file  Other Topics Concern   Not on file  Social History Narrative   Not on file   Social Determinants of Health   Financial Resource Strain: Not on file  Food Insecurity: Not on file  Transportation Needs: Not on file  Physical Activity: Not on file  Stress: Not on file  Social Connections: Not on file     Family History: The patient's family history includes Heart attack in his brother, father, and sister.  ROS:   Please see the history of present illness.    (+) Chest pain (+) Epistaxis All other systems reviewed and are negative.  EKGs/Labs/Other Studies Reviewed:    The following studies were reviewed today:  11/2019  Monitor: Normal sinus rhythm and sinus bradycardia Second-degree AV block, predominantly nocturnal and presumably during sleep. Occasional episodes during waking hours (Mobitz 1 second-degree AV block). Atrial fibrillation and atrial flutter noted intermittently. Fair rate control with heart rates generally less than 100. Occasional heart rates greater than or equal to 130.  11/03/2019  Echocardiogram: Sonographer Comments: Technically difficult study due to poor echo  windows, suboptimal apical window and patient is morbidly obese.  IMPRESSIONS    1. Left ventricular ejection fraction, by visual estimation, is 60 to  65%. The left ventricle has normal function. There is no left ventricular  hypertrophy.   2. Definity contrast agent was given IV to delineate the left ventricular  endocardial borders.   3. Left ventricular diastolic parameters are consistent with Grade I  diastolic dysfunction (impaired relaxation).   4. The left ventricle has no regional wall motion abnormalities.   5. Global right  ventricle has normal systolic function.The right  ventricular size is normal. No increase in right ventricular wall  thickness.   6. Left atrial size was normal.   7. Right atrial size was normal.   8. Mild mitral annular calcification.   9. The mitral valve is normal in structure. Mild mitral valve  regurgitation. No evidence of mitral stenosis.  10. The tricuspid valve is normal in structure. Tricuspid valve  regurgitation is trivial.  11. The aortic valve is tricuspid. Aortic valve regurgitation is trivial.  No evidence of aortic valve sclerosis or stenosis.  12. The tricuspid regurgitant velocity is 2.84 m/s, and with an assumed  right atrial pressure of 3 mmHg, the estimated right ventricular systolic  pressure is mildly elevated at 35.3 mmHg.  13. The inferior vena cava is normal in size with greater than 50%  respiratory variability, suggesting right atrial pressure of 3 mmHg.   In comparison to the previous echocardiogram(s): 04/27/14 EF 60-65%. PA  pressure 28mHg.   03/16/2014  Myocardial  Perfusion Imaging: Notes Recorded by Brandon Grooms, MD on 03/16/2014 at 9:55 AM Low risk study with normal pumping ability and no evidence of blockage.   EKG:   EKG is personally reviewed.     Recent Labs: No results found for requested labs within last 365 days.   Recent Lipid Panel    Component Value Date/Time   CHOL 166 09/15/2019 0323   TRIG 33 09/15/2019 0323   HDL 78 09/15/2019 0323   CHOLHDL 2.1 09/15/2019 0323   VLDL 7 09/15/2019 0323   LDLCALC 81 09/15/2019 0323    Physical Exam:    VS:  BP 138/66   Pulse 84   Ht '5\' 8"'$  (1.727 m)   Wt 157 lb (71.2 kg)   BMI 23.87 kg/m     Wt Readings from Last 3 Encounters:  06/13/22 157 lb (71.2 kg)  02/16/21 173 lb 3.2 oz (78.6 kg)  12/14/19 173 lb 1.9 oz (78.5 kg)     GEN: Well nourished, well developed in no acute distress.  Elderly HEENT: Normal NECK: No JVD; No carotid bruits LYMPHATICS: No  lymphadenopathy CARDIAC: RRR, no murmurs, rubs, gallops RESPIRATORY:  Clear to auscultation without rales, wheezing or rhonchi  ABDOMEN: Soft, non-tender, non-distended MUSCULOSKELETAL:  No edema; No deformity  SKIN: Warm and dry NEUROLOGIC:  Alert and oriented x 3 PSYCHIATRIC:  Normal affect       ASSESSMENT:    1. PAF (paroxysmal atrial fibrillation) (HCC)   2. Epistaxis   3. Pre-op evaluation    PLAN:    In order of problems listed above:  #Paroxysmal atrial fibrillation #Epistaxis Overall low burden of arrhythmia.  Currently on aspirin 81 mg by mouth once daily instead of Eliquis given recurrent nosebleeds.  I do think left atrial appendage occlusion is a reasonable stroke risk mitigation strategy given his history of recurrent nosebleeds and his elevated CHA2DS2-VASc.  We discussed the procedure in detail including the risks and recovery.  We discussed the need for short-term anticoagulation.  Once the procedure is scheduled, plan for him to start Eliquis 5 mg by mouth twice daily for 4 weeks prior to the procedure.  He would continue it for 45 days after the procedure and then transition to Plavix to complete 6 months of post implant therapy.  I have seen SERAPIO EDELSON in the office today who is being considered for a Watchman left atrial appendage closure device. I believe they will benefit from this procedure given their history of atrial fibrillation, CHA2DS2-VASc score of 5 and unadjusted ischemic stroke rate of 7.2% per year. Unfortunately, the patient is not felt to be a long term anticoagulation candidate secondary to recurrent nose bleeds. The patient's chart has been reviewed and I feel that they would be a candidate for short term oral anticoagulation after Watchman implant.   It is my belief that after undergoing a LAA closure procedure, JAHMEIR GEISEN will not need long term anticoagulation which eliminates anticoagulation side effects and major bleeding risk.    Procedural risks for the Watchman implant have been reviewed with the patient including a 0.5% risk of stroke, <1% risk of perforation and <1% risk of device embolization. Other risks include bleeding, vascular damage, tamponade, worsening renal function, and death. The patient understands these risk and wishes to proceed.     The published clinical data on the safety and effectiveness of WATCHMAN include but are not limited to the following: - Holmes DR, Mechele Claude, Sick P et al.  for the PROTECT AF Investigators. Percutaneous closure of the left atrial appendage versus warfarin therapy for prevention of stroke in patients with atrial fibrillation: a randomised non-inferiority trial. Lancet 2009; 374: 534-42. Mechele Claude, Doshi SK, Abelardo Diesel D et al. on behalf of the PROTECT AF Investigators. Percutaneous Left Atrial Appendage Closure for Stroke Prophylaxis in Patients With Atrial Fibrillation 2.3-Year Follow-up of the PROTECT AF (Watchman Left Atrial Appendage System for Embolic Protection in Patients With Atrial Fibrillation) Trial. Circulation 2013; 127:720-729. - Alli O, Doshi S,  Kar S, Reddy VY, Sievert H et al. Quality of Life Assessment in the Randomized PROTECT AF (Percutaneous Closure of the Left Atrial Appendage Versus Warfarin Therapy for Prevention of Stroke in Patients With Atrial Fibrillation) Trial of Patients at Risk for Stroke With Nonvalvular Atrial Fibrillation. J Am Coll Cardiol 2013; 09:9833-8. Vertell Limber DR, Tarri Abernethy, Price M, [place], Sievert H, Doshi S, Huber K, Reddy V. Prospective randomized evaluation of the Watchman left atrial appendage Device in patients with atrial fibrillation versus long-term warfarin therapy; the PREVAIL trial. Journal of the [employer] of Cardiology, Vol. 4, No. 1, 2014, 1-11. - Kar S, Doshi SK, Sadhu A, Horton R, Osorio J et al. Primary outcome evaluation of a next-generation left atrial appendage closure device: results from the PINNACLE  FLX trial. Circulation 2021;143(18)1754-1762.    After today's visit with the patient which was dedicated solely for shared decision making visit regarding LAA closure device, the patient decided to proceed with the LAA appendage closure procedure scheduled to be done in the near future at [facility]. Prior to the procedure, I would like to obtain a gated CT scan of the chest with contrast timed for PV/LA visualization.    HAS-BLED score 3 Hypertension Yes  Abnormal renal and liver function (Dialysis, transplant, Cr >2.26 mg/dL /Cirrhosis or Bilirubin >2x Normal or AST/ALT/AP >3x Normal) No  Stroke No  Bleeding Yes  Labile INR (Unstable/high INR) No  Elderly (>65) Yes  Drugs or alcohol (? 8 drinks/week, anti-plt or NSAID) No   CHA2DS2-VASc Score = 5  The patient's score is based upon: CHF History: 0 HTN History: 1 Diabetes History: 1 Stroke History: 0 Vascular Disease History: 1 Age Score: 2 Gender Score: 0   Total time spent with patient today 60 minutes. This includes reviewing records, evaluating the patient and coordinating care.  Medication Adjustments/Labs and Tests Ordered: Current medicines are reviewed at length with the patient today.  Concerns regarding medicines are outlined above.   Orders Placed This Encounter  Procedures   CT CARDIAC MORPH/PULM VEIN W/CM&W/O CA SCORE   Basic Metabolic Panel (BMET)   ECHOCARDIOGRAM COMPLETE   No orders of the defined types were placed in this encounter.   I,Mathew Stumpf,acting as a Education administrator for Brandon Epley, MD.,have documented all relevant documentation on the behalf of Brandon Epley, MD,as directed by  Brandon Epley, MD while in the presence of Brandon Epley, MD.  I, Brandon Epley, MD, have reviewed all documentation for this visit. The documentation on 06/13/22 for the exam, diagnosis, procedures, and orders are all accurate and complete.   Signed, Hilton Cork. Quentin Ore, MD, [facility],  [facility] 06/13/2022 10:47 AM    Electrophysiology [facility]

## 2022-06-13 ENCOUNTER — Encounter: Payer: Self-pay | Admitting: *Deleted

## 2022-06-13 ENCOUNTER — Ambulatory Visit (INDEPENDENT_AMBULATORY_CARE_PROVIDER_SITE_OTHER): Payer: Medicare Other | Admitting: Cardiology

## 2022-06-13 ENCOUNTER — Encounter: Payer: Self-pay | Admitting: Cardiology

## 2022-06-13 VITALS — BP 138/66 | HR 84 | Ht 68.0 in | Wt 157.0 lb

## 2022-06-13 DIAGNOSIS — Z01818 Encounter for other preprocedural examination: Secondary | ICD-10-CM

## 2022-06-13 DIAGNOSIS — I480 Paroxysmal atrial fibrillation: Secondary | ICD-10-CM

## 2022-06-13 DIAGNOSIS — R040 Epistaxis: Secondary | ICD-10-CM

## 2022-06-13 LAB — BASIC METABOLIC PANEL
BUN/Creatinine Ratio: 10 (ref 10–24)
BUN: 13 mg/dL (ref 8–27)
CO2: 22 mmol/L (ref 20–29)
Calcium: 9.5 mg/dL (ref 8.6–10.2)
Chloride: 102 mmol/L (ref 96–106)
Creatinine, Ser: 1.31 mg/dL — ABNORMAL HIGH (ref 0.76–1.27)
Glucose: 114 mg/dL — ABNORMAL HIGH (ref 70–99)
Potassium: 4.4 mmol/L (ref 3.5–5.2)
Sodium: 139 mmol/L (ref 134–144)
eGFR: 55 mL/min/{1.73_m2} — ABNORMAL LOW (ref >59)

## 2022-06-13 NOTE — Patient Instructions (Addendum)
Medication Instructions:  Your physician recommends that you continue on your current medications as directed. Please refer to the Current Medication list given to you today. *If you need a refill on your cardiac medications before your next appointment, please call your pharmacy*  Lab Work: BMP If you have labs (blood work) drawn today and your tests are completely normal, you will receive your results only by: [place] (if you have MyChart) OR A paper copy in the mail If you have any lab test that is abnormal or we need to change your treatment, we will call you to review the results.  Testing/Procedures: Your physician has requested that you have an echocardiogram. Echocardiography is a painless test that uses sound waves to create images of your heart. It provides your doctor with information about the size and shape of your heart and how well your heart's chambers and valves are working. This procedure takes approximately one hour. There are no restrictions for this procedure.  Your physician has requested that you have cardiac CT. Cardiac computed tomography (CT) is a painless test that uses an x-ray machine to take clear, detailed pictures of your heart. For further information please visit [email or web address]. Please follow instruction sheet as given.  Your physician has requested that you have Left atrial appendage (LAA) closure device implantation is a procedure to put a small device in the LAA of the heart. The LAA is a small sac in the wall of the heart's left upper chamber. Blood clots can form in this area. The device, Watchman closes the LAA to help prevent a blood clot and stroke.   Follow-Up: At [facility], you and your health needs are our priority.  As part of our continuing mission to provide you with exceptional heart care, we have created designated Provider Care Teams.  These Care Teams include your primary Cardiologist (physician) and Advanced Practice Providers  (APPs -  Physician Assistants and Nurse Practitioners) who all work together to provide you with the care you need, when you need it.  Your physician wants you to follow-up in: Lenice Llamas, the [facility] Nurse Navigator, will call you after your CT once the [facility] Team has reviewed your imaging for an update on proceedings. [place]'s direct number is [phone] if you need assistance.   We recommend signing up for the patient portal called "MyChart".  Sign up information is provided on this After Visit Summary.  MyChart is used to connect with patients for Virtual Visits (Telemedicine).  Patients are able to view lab/test results, encounter notes, upcoming appointments, etc.  Non-urgent messages can be sent to your provider as well.   To learn more about what you can do with MyChart, go to [email or web address].    Any Other Special Instructions Will Be Listed Below (If Applicable).  Left Atrial Appendage Closure Device Implantation  Left atrial appendage (LAA) closure device implantation is a procedure to put a small device in the LAA of the heart. The LAA is a small sac in the wall of the heart's left upper chamber. Blood clots can form in the LAA in people with atrial fibrillation (AFib). The device closes the LAA to help prevent a blood clot and stroke. AFib is a type of irregular or rapid heartbeat (arrhythmia). There is an increased risk of blood clots and stroke with AFib. This procedure helps to reduce that risk. Tell a health care provider about: Any allergies you have. All medicines you are taking, including vitamins, herbs, eye drops, creams, and over-the-counter  medicines. Any problems you or family members have had with anesthetic medicines. Any blood disorders you have. Any surgeries you have had. Any medical conditions you have. Whether you are pregnant or may be pregnant. What are the risks? Generally, this is a safe procedure. However, problems may occur,  including: Infection. Bleeding. Allergic reactions to medicines or dyes. Damage to nearby structures or organs. Heart attack. Stroke. Blood clots. Changes in heart rhythm. Device failure. What happens before the procedure? Staying hydrated Follow instructions from your health care provider about hydration, which may include: Up to 2 hours before the procedure - you may continue to drink clear liquids, such as water, clear fruit juice, black coffee, and plain tea. Eating and drinking restrictions Follow instructions from your health care provider about eating and drinking, which may include: 8 hours before the procedure - stop eating heavy meals or foods, such as meat, fried foods, or fatty foods. 6 hours before the procedure - stop eating light meals or foods, such as toast or cereal. 6 hours before the procedure - stop drinking milk or drinks that contain milk. 2 hours before the procedure - stop drinking clear liquids. Medicines Ask your health care provider about: Changing or stopping your regular medicines. This is especially important if you are taking diabetes medicines or blood thinners. Taking medicines such as aspirin and ibuprofen. These medicines can thin your blood. Do not take these medicines unless your health care provider tells you to take them. Taking over-the-counter medicines, vitamins, herbs, and supplements. Tests You may have blood tests and a physical exam. You may have an electrocardiogram (ECG). This test checks your heart's electrical patterns and rhythms. General instructions Do not use any products that contain nicotine or tobacco. These include cigarettes, chewing tobacco, and vaping devices, such as e-cigarettes. If you need help quitting, ask your health care provider. Ask your health care provider what steps will be taken to help prevent infection. These steps may include: Removing hair at the surgery site. Washing skin with a germ-killing soap. Taking  antibiotic medicine. Plan to have a responsible adult take you home from the hospital or clinic. Plan to have a responsible adult care for you for the time you are told after you leave the hospital or clinic. This is important. What happens during the procedure? An IV will be inserted into one of your veins. You will be given one or more of the following: A medicine to help you relax (sedative). A medicine to make you fall asleep (general anesthetic). A small incision will be made in your groin area. A small wire will be put through the incision and into a blood vessel. Dye may be injected so X-rays can be used to guide the wire through the blood vessel. A long, thin tube (catheter) will be put over the small wire and moved up through the blood vessel to reach your heart. The closure device will be moved through the catheter until it reaches your heart. A small hole will be made in the septum (transseptal puncture). The septum is a thin tissue that separates the upper two chambers of the heart. The device will be placed so that it closes the LAA. X-rays will be done to make sure the device is in the right place. The catheter and wire will be removed. The closure device will remain in your heart. After pressure is applied over the catheter site to prevent bleeding, a bandage (dressing) will be placed over the site where the catheter  was inserted. The procedure may vary among health care providers and hospitals. What happens after the procedure? Your blood pressure, heart rate, breathing rate, and blood oxygen level will be monitored until you leave the hospital or clinic. You may have to wear compression stockings. These stockings help to prevent blood clots and reduce swelling in your legs. If you were given a sedative during the procedure, it can affect you for several hours. Do not drive or operate machinery until your health care provider says it is safe. You may be given pain medicine. You  may need to drink more fluids to wash (flush) the dye out of your body. Drink enough fluid to keep your urine pale yellow. Take over-the-counter and prescription medicines only as told by your health care provider. This is especially important if you were given blood thinners. Summary Left atrial appendage (LAA) closure device implantation is a procedure that is done to put a small device in the LAA of the heart. The LAA is a small sac in the wall of the heart's left upper chamber. The device closes the LAA to prevent stroke and other problems. Follow instructions from your health care provider before and after the procedure. This information is not intended to replace advice given to you by your health care provider. Make sure you discuss any questions you have with your health care provider. Document Revised: 07/13/2020 Document Reviewed: 07/13/2020 Elsevier Patient Education  [place].

## 2022-06-14 DIAGNOSIS — H6123 Impacted cerumen, bilateral: Secondary | ICD-10-CM | POA: Diagnosis not present

## 2022-06-14 DIAGNOSIS — I480 Paroxysmal atrial fibrillation: Secondary | ICD-10-CM | POA: Diagnosis not present

## 2022-06-14 DIAGNOSIS — R413 Other amnesia: Secondary | ICD-10-CM | POA: Diagnosis not present

## 2022-06-14 DIAGNOSIS — K2900 Acute gastritis without bleeding: Secondary | ICD-10-CM | POA: Diagnosis not present

## 2022-06-14 DIAGNOSIS — G4701 Insomnia due to medical condition: Secondary | ICD-10-CM | POA: Diagnosis not present

## 2022-06-14 DIAGNOSIS — R040 Epistaxis: Secondary | ICD-10-CM | POA: Diagnosis not present

## 2022-06-18 NOTE — Progress Notes (Signed)
Cardiology Office Note:    Date:  06/21/2022   ID:  [name], DOB 04/04/1941, MRN [id number]  PCP:  Brandon Lass, MD  Cardiologist:  Brandon Grooms, MD   Referring MD: Brandon Lass, MD   Chief Complaint  Patient presents with   Atrial Fibrillation    History of Present Illness:    [name] is a 81 y.o. male with a hx of PAF, DM II, hypertension, and attention deficit disorder.Also has h/o PAC with block. Frequent nose bleeds on Eliquis and now back on aspirin per PCP.   The patient is here today to follow-up.  He was diagnosed as having PAF in 2020 from a Holter monitor.  Full report is not available.  Burden of atrial fibrillation is not known.  He has been on apixaban since 2020.  More recently he has begun having difficulty with epistaxis on Eliquis.    Past Medical History:  Diagnosis Date   Arthritis    Attention deficit disorder    Blind left eye    Diabetes mellitus without complication ([place])    Hyperlipidemia    Hypertension     Past Surgical History:  Procedure Laterality Date   EYE SURGERY Left    x3   KNEE ARTHROSCOPY Bilateral 03/29/2014   Procedure: RIGHT KNEE ARTHROSCOPY AND DEBRIDEMENT, LEFT KNEE ASPIRATION,  LEFT KNEE ARTHROSCOPY AND DEBRIDEMENT.;  Surgeon: Meredith Pel, MD;  Location: [place];  Service: Orthopedics;  Laterality: Bilateral;  RIGHT KNEE ARTHROSCOPY AND DEBRIDEMENT, LEFT KNEE ASPIRATION,  LEFT KNEE ARTHROSCOPY AND DEBRIDEMENT.   KNEE ARTHROTOMY Bilateral 04/26/2014   Procedure: KNEE ARTHROTOMY, SYNOVECTOMY, ANTIBIOTIC BEAD PLACEMENT.;  Surgeon: Meredith Pel, MD;  Location: [place];  Service: Orthopedics;  Laterality: Bilateral;   lipoma removal, right upper back     TONSILLECTOMY     TOTAL HIP ARTHROPLASTY Left 09/14/2019   TOTAL HIP ARTHROPLASTY Left 09/14/2019   Procedure: LEFT TOTAL HIP ARTHROPLASTY ANTERIOR APPROACH;  Surgeon: Meredith Pel, MD;  Location: [facility];  Service: Orthopedics;  Laterality: Left;     Current Medications: Current Meds  Medication Sig   aspirin EC 81 MG tablet Take 1 tablet (81 mg total) by mouth daily. Swallow whole.   mirtazapine (REMERON) 15 MG tablet Take 15 mg by mouth at bedtime.   omeprazole (PRILOSEC) 20 MG capsule 1 capsule 30-60 minutes before morning meal and at bedtime   [DISCONTINUED] apixaban (ELIQUIS) 5 MG TABS tablet Take 1 tablet (5 mg total) by mouth 2 (two) times daily.   [DISCONTINUED] aspirin EC 81 MG tablet Take 81 mg by mouth daily. Swallow whole.     Allergies:   Patient has no known allergies.   Social History   Socioeconomic History   Marital status: Married    Spouse name: Not on file   Number of children: Not on file   Years of education: Not on file   Highest education level: Not on file  Occupational History   Not on file  Tobacco Use   Smoking status: Former   Smokeless tobacco: Never  Vaping Use   Vaping Use: Never used  Substance and Sexual Activity   Alcohol use: Yes    Comment: occasional    Drug use: No   Sexual activity: Not on file  Other Topics Concern   Not on file  Social History Narrative   Not on file   Social Determinants of Health   Financial Resource Strain: Not on file  Food  Insecurity: Not on file  Transportation Needs: Not on file  Physical Activity: Not on file  Stress: Not on file  Social Connections: Not on file     Family History: The patient's family history includes Heart attack in his brother, father, and sister.  ROS:   Please see the history of present illness.    Patient's memory has gotten significantly worse.  He is losing weight.  Appetite is decreased.  He no longer drives.  All other systems reviewed and are negative.  EKGs/Labs/Other Studies Reviewed:    The following studies were reviewed today: Long-term monitor 2020: Port not available.  Diagnosis made from artifact field rhythm strip.  Cardiac event monitor: January 2021 Study Highlights  Normal sinus rhythm and  sinus bradycardia Second-degree AV block, predominantly nocturnal and presumably during sleep. Occasional episodes during waking hours (Mobitz 1 second-degree AV block). Atrial fibrillation and atrial flutter noted intermittently. Fair rate control with heart rates generally less than 100. Occasional heart rates greater than or equal to 130. EKG:  EKG normal sinus rhythm, early repolarization, normal EKG.  Compared to prior tracing from April 2022, no changes noted.  Recent Labs: 06/13/2022: BUN 13; Creatinine, Ser 1.31; Potassium 4.4; Sodium 139  Recent Lipid Panel    Component Value Date/Time   CHOL 166 09/15/2019 0323   TRIG 33 09/15/2019 0323   HDL 78 09/15/2019 0323   CHOLHDL 2.1 09/15/2019 0323   VLDL 7 09/15/2019 0323   LDLCALC 81 09/15/2019 0323    Physical Exam:    VS:  BP 110/62   Pulse 75   Ht '5\' 8"'$  (1.727 m)   Wt 153 lb 9.6 oz (69.7 kg)   SpO2 99%   BMI 23.35 kg/m     Wt Readings from Last 3 Encounters:  06/21/22 153 lb 9.6 oz (69.7 kg)  06/13/22 157 lb (71.2 kg)  02/16/21 173 lb 3.2 oz (78.6 kg)     GEN: Does not speak much. No acute distress HEENT: Normal NECK: No JVD. LYMPHATICS: No lymphadenopathy CARDIAC: No murmur. RRR no gallop, or edema. VASCULAR:  Normal Pulses. No bruits. RESPIRATORY:  Clear to auscultation without rales, wheezing or rhonchi  ABDOMEN: Soft, non-tender, non-distended, No pulsatile mass, MUSCULOSKELETAL: No deformity  SKIN: Warm and dry NEUROLOGIC:  Alert and oriented x 3 PSYCHIATRIC:  Normal affect   ASSESSMENT:    1. PAF (paroxysmal atrial fibrillation) (HCC)   2. Epistaxis   3. Essential hypertension   4. Mixed hyperlipidemia   5. Chronic anticoagulation    PLAN:    In order of problems listed above:  He will wear a monitor for 30 days.  Need to reconfirm what level of atrial fibrillation/burden he has.  For the time being will remain on aspirin.  The patient's wife has decided against consenting him for Watchman  procedure despite our recommendations.  Given his decline in strength, weight loss, and overall status perhaps not proceeding with watchman is reasonable. We may reconsider Eliquis therapy after the monitor.  For the time being and stay on aspirin 81 mg/day.  He needs a 29-monthfollow-up.  [facility] for Left Atrial Appendage Closure with Non-Valvular Atrial Fibrillation   [facility] Referral for Left Atrial Appendage Closure with Non-Valvular Atrial Fibrillation   Brandon FEENSTRAis a 81y.o. male [facility] Left Atrial Appendage Closure with Watchman device for the management of stroke risk  resulting form non-valvular atrial fibrillation.    Base upon Mr. Balingit history, he is felt to be a poor candidate for long-term anticoagulation because of a history of bleeding (e.g. intracerebral, subdural, GI, retro-peritoneal) and cognitive impairment.  The patient has a HAS-BLED score of  4 indicating a Yearly Major Bleeding Risk of 8.78.7%.     His CHADS2-VASc Score is 5 with an unadjusted Ischemic Stroke Rate (% per year) of 7.2%.    His stroke risk necessitates a strategy of stroke prevention with either long-term oral anticoagulation or left atrial appendage occlusion therapy. We have discussed their bleeding risk in the context of their comorbid medical problems, as well as the rationale for referral for evaluation of Watchman left atrial appendage occlusion therapy. While the patient is at high long-term bleeding risk, they may be appropriate for short-term anticoagulation. Based on this individual patient's stroke and bleeding risk, a shared decision has been made to refer the patient for consideration of Watchman left atrial appendage closure utilizing the [employer] of Cardiology shared decision tool.   Medication Adjustments/Labs and Tests Ordered: Current  medicines are reviewed at length with the patient today.  Concerns regarding medicines are outlined above.  Orders Placed This Encounter  Procedures   CARDIAC EVENT MONITOR   EKG 12-Lead   Meds ordered this encounter  Medications   DISCONTD: apixaban (ELIQUIS) 5 MG TABS tablet    Sig: Take 1 tablet (5 mg total) by mouth 2 (two) times daily.    Dispense:  60 tablet    Refill:  11   aspirin EC 81 MG tablet    Sig: Take 1 tablet (81 mg total) by mouth daily. Swallow whole.    Patient Instructions  Medication Instructions:  Your physician has recommended you make the following change in your medication:   1) STOP Aspirin 2) START Eliquis '5mg'$  twice daily on Monday August 7th  *If you need a refill on your cardiac medications before your next appointment, please call your pharmacy*  Lab Work: NONE  Testing/Procedures: NONE  Follow-Up: At [facility], you and your health needs are our priority.  As part of our continuing mission to provide you with exceptional heart care, we have created designated Provider Care Teams.  These Care Teams include your primary Cardiologist (physician) and Advanced Practice Providers (APPs -  Physician Assistants and Nurse Practitioners) who all work together to provide you with the care you need, when you need it.  Your next appointment:   6 month(s)  The format for your next appointment:   In Person  Provider:   Sinclair Grooms, MD {   Important Information About Sugar         Signed, Brandon Grooms, MD  06/21/2022 3:38 PM    [place]

## 2022-06-19 ENCOUNTER — Telehealth: Payer: Self-pay | Admitting: Interventional Cardiology

## 2022-06-19 NOTE — Telephone Encounter (Signed)
Received call from [place] with Dr. Irma Newness office.  Per Jiles Garter, Dr. Sabra Heck sent a message to Dr. Tamala Julian regarding concerns for Watchman procedure. She would like to know if he should stay on aspirin '81mg'$  QD or lower Eliquis dose. Reports patient has poor appetite and some dementia.  Patient has appt with Dr. Tamala Julian on 06/21/22 where he is to discuss Watchman procedure with patient (per staff message from [facility] 8/1).  Jiles Garter provided office number for callback if any questions or if Dr. Tamala Julian needs more information/to discuss patient with Dr. Sabra Heck: [phone] ext. [phone] (and ext [phone] for [place]).

## 2022-06-21 ENCOUNTER — Ambulatory Visit (INDEPENDENT_AMBULATORY_CARE_PROVIDER_SITE_OTHER): Payer: Medicare Other | Admitting: Interventional Cardiology

## 2022-06-21 ENCOUNTER — Encounter: Payer: Self-pay | Admitting: Interventional Cardiology

## 2022-06-21 VITALS — BP 110/62 | HR 75 | Ht 68.0 in | Wt 153.6 lb

## 2022-06-21 DIAGNOSIS — I10 Essential (primary) hypertension: Secondary | ICD-10-CM | POA: Diagnosis not present

## 2022-06-21 DIAGNOSIS — I480 Paroxysmal atrial fibrillation: Secondary | ICD-10-CM | POA: Diagnosis not present

## 2022-06-21 DIAGNOSIS — Z7901 Long term (current) use of anticoagulants: Secondary | ICD-10-CM

## 2022-06-21 DIAGNOSIS — R040 Epistaxis: Secondary | ICD-10-CM

## 2022-06-21 DIAGNOSIS — E782 Mixed hyperlipidemia: Secondary | ICD-10-CM

## 2022-06-21 MED ORDER — ASPIRIN 81 MG PO TBEC: 81.0000 mg | DELAYED_RELEASE_TABLET | Freq: Every day | ORAL | Status: AC

## 2022-06-21 MED ORDER — APIXABAN 5 MG PO TABS: 5.0000 mg | ORAL_TABLET | Freq: Two times a day (BID) | ORAL | 11 refills | Status: DC

## 2022-06-21 NOTE — Patient Instructions (Addendum)
Medication Instructions:  Your physician recommends that you continue on your current medications as directed. Please refer to the Current Medication list given to you today.  *If you need a refill on your cardiac medications before your next appointment, please call your pharmacy*  Lab Work: NONE  Testing/Procedures: Your physician has requested for you to wear a heart monitor for 30 days. [employer] is the company for the heart monitor and this will be mailed to your home with instructions on how to apply and return the monitor. If you have any questions please call [phone].  Follow-Up: At [facility], you and your health needs are our priority.  As part of our continuing mission to provide you with exceptional heart care, we have created designated Provider Care Teams.  These Care Teams include your primary Cardiologist (physician) and Advanced Practice Providers (APPs -  Physician Assistants and Nurse Practitioners) who all work together to provide you with the care you need, when you need it.  Your next appointment:   6 month(s)  The format for your next appointment:   In Person  Provider:   Sinclair Grooms, MD {  Other Instructions [employer] Cardiac Event Monitor Instructions Your physician has requested you wear your cardiac event monitor for 30 days, (1-30). [employer] may call or text to confirm a shipping address. The monitor will be sent to a land address via UPS. [employer] will not ship a monitor to a PO BOX. It typically takes 3-5 days to receive your monitor after it has been enrolled. [employer] will assist with USPS tracking if your package is delayed. The telephone number for [employer] is [phone]. Once you have received your monitor, please review the enclosed instructions. Instruction tutorials can also be viewed under help and settings on the enclosed cell phone. Your monitor has already been registered assigning a specific monitor serial # to  you.  Applying the monitor Remove cell phone from case and turn it on. The cell phone works as Dealer and needs to be within [employer] of you at all times. The cell phone will need to be charged on a daily basis. We recommend you plug the cell phone into the enclosed charger at your bedside table every night.  Monitor batteries: You will receive two monitor batteries labelled #1 and #2. These are your recorders. Plug battery #2 onto the second connection on the enclosed charger. Keep one battery on the charger at all times. This will keep the monitor battery deactivated. It will also keep it fully charged for when you need to switch your monitor batteries. A small light will be blinking on the battery emblem when it is charging. The light on the battery emblem will remain on when the battery is fully charged.  Open package of a Monitor strip. Insert battery #1 into black hood on strip and gently squeeze monitor battery onto connection as indicated in instruction booklet. Set aside while preparing skin.  Choose location for your strip, vertical or horizontal, as indicated in the instruction booklet. Shave to remove all hair from location. There cannot be any lotions, oils, powders, or colognes on skin where monitor is to be applied. Wipe skin clean with enclosed Saline wipe. Dry skin completely.  Peel paper labeled #1 off the back of the Monitor strip exposing the adhesive. Place the monitor on the chest in the vertical or horizontal position shown in the instruction booklet. One arrow on the monitor strip must be pointing upward. Carefully remove paper  labeled #2, attaching remainder of strip to your skin. Try not to create any folds or wrinkles in the strip as you apply it.  Firmly press and release the circle in the center of the monitor battery. You will hear a small beep. This is turning the monitor battery on. The heart emblem on the monitor battery will light up every 5  seconds if the monitor battery in turned on and connected to the patient securely. Do not push and hold the circle down as this turns the monitor battery off. The cell phone will locate the monitor battery. A screen will appear on the cell phone checking the connection of your monitor strip. This may read poor connection initially but change to good connection within the next minute. Once your monitor accepts the connection you will hear a series of 3 beeps followed by a climbing crescendo of beeps. A screen will appear on the cell phone showing the two monitor strip placement options. Touch the picture that demonstrates where you applied the monitor strip.  Your monitor strip and battery are waterproof. You are able to shower, bathe, or swim with the monitor on. They just ask you do not submerge deeper than 3 feet underwater. We recommend removing the monitor if you are swimming in a lake, river, or ocean.  Your monitor battery will need to be switched to a fully charged monitor battery approximately once a week. The cell phone will alert you of an action which needs to be made.  On the cell phone, tap for details to reveal connection status, monitor battery status, and cell phone battery status. The green dots indicates your monitor is in good status. A red dot indicates there is something that needs your attention.  To record a symptom, click the circle on the monitor battery. In 30-60 seconds a list of symptoms will appear on the cell phone. Select your symptom and tap save. Your monitor will record a sustained or significant arrhythmia regardless of you clicking the button. Some patients do not feel the heart rhythm irregularities. [employer] will notify us of any serious or critical events.  Refer to instruction booklet for instructions on switching batteries, changing strips, the Do not disturb or Pause features, or any additional questions.  Call [employer] at [phone], to  confirm your monitor is transmitting and record your baseline. They will answer any questions you may have regarding the monitor instructions at that time.  Returning the monitor to [place] all equipment back into blue box. Peel off strip of paper to expose adhesive and close box securely. There is a prepaid UPS shipping label on this box. Drop in a UPS drop box, or at a UPS facility like [employer]. You may also contact [employer] to arrange UPS to pick up monitor package at your home.  Important Information About Sugar

## 2022-06-27 ENCOUNTER — Telehealth: Payer: Self-pay | Admitting: Interventional Cardiology

## 2022-06-27 NOTE — Telephone Encounter (Signed)
Patient's wife states they received patient's event monitor and may need assitance applying it. She would like to make arrangements if possible.

## 2022-07-02 ENCOUNTER — Ambulatory Visit (HOSPITAL_BASED_OUTPATIENT_CLINIC_OR_DEPARTMENT_OTHER): Payer: Medicare Other

## 2022-07-02 ENCOUNTER — Ambulatory Visit (INDEPENDENT_AMBULATORY_CARE_PROVIDER_SITE_OTHER): Payer: Medicare Other

## 2022-07-02 DIAGNOSIS — I442 Atrioventricular block, complete: Secondary | ICD-10-CM | POA: Diagnosis not present

## 2022-07-02 DIAGNOSIS — R001 Bradycardia, unspecified: Secondary | ICD-10-CM | POA: Diagnosis present

## 2022-07-02 DIAGNOSIS — R040 Epistaxis: Secondary | ICD-10-CM | POA: Insufficient documentation

## 2022-07-02 DIAGNOSIS — B961 Klebsiella pneumoniae [K. pneumoniae] as the cause of diseases classified elsewhere: Secondary | ICD-10-CM | POA: Diagnosis not present

## 2022-07-02 DIAGNOSIS — Z01818 Encounter for other preprocedural examination: Secondary | ICD-10-CM | POA: Insufficient documentation

## 2022-07-02 DIAGNOSIS — I480 Paroxysmal atrial fibrillation: Secondary | ICD-10-CM

## 2022-07-02 DIAGNOSIS — Z96642 Presence of left artificial hip joint: Secondary | ICD-10-CM | POA: Diagnosis present

## 2022-07-02 DIAGNOSIS — E876 Hypokalemia: Secondary | ICD-10-CM | POA: Diagnosis not present

## 2022-07-02 DIAGNOSIS — Z8249 Family history of ischemic heart disease and other diseases of the circulatory system: Secondary | ICD-10-CM | POA: Diagnosis not present

## 2022-07-02 DIAGNOSIS — M19041 Primary osteoarthritis, right hand: Secondary | ICD-10-CM | POA: Diagnosis present

## 2022-07-02 DIAGNOSIS — F988 Other specified behavioral and emotional disorders with onset usually occurring in childhood and adolescence: Secondary | ICD-10-CM | POA: Diagnosis present

## 2022-07-02 DIAGNOSIS — N3000 Acute cystitis without hematuria: Secondary | ICD-10-CM | POA: Diagnosis not present

## 2022-07-02 DIAGNOSIS — R6883 Chills (without fever): Secondary | ICD-10-CM | POA: Diagnosis not present

## 2022-07-02 DIAGNOSIS — K219 Gastro-esophageal reflux disease without esophagitis: Secondary | ICD-10-CM | POA: Diagnosis not present

## 2022-07-02 DIAGNOSIS — M19042 Primary osteoarthritis, left hand: Secondary | ICD-10-CM | POA: Diagnosis present

## 2022-07-02 DIAGNOSIS — Z7189 Other specified counseling: Secondary | ICD-10-CM | POA: Diagnosis not present

## 2022-07-02 DIAGNOSIS — E872 Acidosis, unspecified: Secondary | ICD-10-CM | POA: Diagnosis not present

## 2022-07-02 DIAGNOSIS — I459 Conduction disorder, unspecified: Secondary | ICD-10-CM | POA: Diagnosis not present

## 2022-07-02 DIAGNOSIS — R9431 Abnormal electrocardiogram [ECG] [EKG]: Secondary | ICD-10-CM | POA: Diagnosis not present

## 2022-07-02 DIAGNOSIS — H5462 Unqualified visual loss, left eye, normal vision right eye: Secondary | ICD-10-CM | POA: Diagnosis present

## 2022-07-02 DIAGNOSIS — R008 Other abnormalities of heart beat: Secondary | ICD-10-CM | POA: Diagnosis present

## 2022-07-02 DIAGNOSIS — R7989 Other specified abnormal findings of blood chemistry: Secondary | ICD-10-CM | POA: Diagnosis present

## 2022-07-02 DIAGNOSIS — Z87891 Personal history of nicotine dependence: Secondary | ICD-10-CM | POA: Diagnosis not present

## 2022-07-02 DIAGNOSIS — E119 Type 2 diabetes mellitus without complications: Secondary | ICD-10-CM | POA: Diagnosis not present

## 2022-07-02 DIAGNOSIS — I358 Other nonrheumatic aortic valve disorders: Secondary | ICD-10-CM | POA: Diagnosis present

## 2022-07-02 DIAGNOSIS — F0390 Unspecified dementia without behavioral disturbance: Secondary | ICD-10-CM | POA: Diagnosis not present

## 2022-07-02 DIAGNOSIS — Z515 Encounter for palliative care: Secondary | ICD-10-CM | POA: Diagnosis not present

## 2022-07-02 DIAGNOSIS — E785 Hyperlipidemia, unspecified: Secondary | ICD-10-CM | POA: Diagnosis present

## 2022-07-02 DIAGNOSIS — I10 Essential (primary) hypertension: Secondary | ICD-10-CM | POA: Diagnosis present

## 2022-07-02 DIAGNOSIS — I4589 Other specified conduction disorders: Secondary | ICD-10-CM | POA: Diagnosis present

## 2022-07-02 LAB — ECHOCARDIOGRAM COMPLETE
Area-P 1/2: 2.71 cm2
P 1/2 time: 427 msec
S' Lateral: 2.4 cm

## 2022-07-02 NOTE — Progress Notes (Unsigned)
[id number] mailed to patient 06/21/22. Applied in office 07/02/22.

## 2022-07-03 ENCOUNTER — Inpatient Hospital Stay (HOSPITAL_COMMUNITY)
Admission: EM | Admit: 2022-07-03 | Discharge: 2022-07-07 | DRG: 309 | Disposition: A | Discharge status: Home Health | Payer: Medicare Other | Attending: Internal Medicine | Admitting: Internal Medicine

## 2022-07-03 ENCOUNTER — Other Ambulatory Visit: Payer: Self-pay

## 2022-07-03 ENCOUNTER — Encounter (HOSPITAL_COMMUNITY): Payer: Self-pay

## 2022-07-03 ENCOUNTER — Other Ambulatory Visit (HOSPITAL_COMMUNITY): Payer: Self-pay

## 2022-07-03 ENCOUNTER — Telehealth: Payer: Self-pay | Admitting: Interventional Cardiology

## 2022-07-03 DIAGNOSIS — R6883 Chills (without fever): Secondary | ICD-10-CM | POA: Diagnosis not present

## 2022-07-03 DIAGNOSIS — N3000 Acute cystitis without hematuria: Secondary | ICD-10-CM

## 2022-07-03 DIAGNOSIS — E872 Acidosis, unspecified: Secondary | ICD-10-CM | POA: Diagnosis not present

## 2022-07-03 DIAGNOSIS — R008 Other abnormalities of heart beat: Secondary | ICD-10-CM | POA: Diagnosis not present

## 2022-07-03 DIAGNOSIS — F988 Other specified behavioral and emotional disorders with onset usually occurring in childhood and adolescence: Secondary | ICD-10-CM | POA: Diagnosis not present

## 2022-07-03 DIAGNOSIS — I480 Paroxysmal atrial fibrillation: Secondary | ICD-10-CM | POA: Diagnosis present

## 2022-07-03 DIAGNOSIS — M19041 Primary osteoarthritis, right hand: Secondary | ICD-10-CM | POA: Diagnosis present

## 2022-07-03 DIAGNOSIS — K219 Gastro-esophageal reflux disease without esophagitis: Secondary | ICD-10-CM | POA: Diagnosis present

## 2022-07-03 DIAGNOSIS — I10 Essential (primary) hypertension: Secondary | ICD-10-CM | POA: Diagnosis present

## 2022-07-03 DIAGNOSIS — R001 Bradycardia, unspecified: Secondary | ICD-10-CM | POA: Diagnosis present

## 2022-07-03 DIAGNOSIS — Z8249 Family history of ischemic heart disease and other diseases of the circulatory system: Secondary | ICD-10-CM | POA: Diagnosis not present

## 2022-07-03 DIAGNOSIS — I358 Other nonrheumatic aortic valve disorders: Secondary | ICD-10-CM | POA: Diagnosis present

## 2022-07-03 DIAGNOSIS — R7989 Other specified abnormal findings of blood chemistry: Secondary | ICD-10-CM | POA: Diagnosis present

## 2022-07-03 DIAGNOSIS — Z96642 Presence of left artificial hip joint: Secondary | ICD-10-CM | POA: Diagnosis present

## 2022-07-03 DIAGNOSIS — M19042 Primary osteoarthritis, left hand: Secondary | ICD-10-CM | POA: Diagnosis not present

## 2022-07-03 DIAGNOSIS — H5462 Unqualified visual loss, left eye, normal vision right eye: Secondary | ICD-10-CM | POA: Diagnosis present

## 2022-07-03 DIAGNOSIS — I442 Atrioventricular block, complete: Principal | ICD-10-CM | POA: Diagnosis present

## 2022-07-03 DIAGNOSIS — F0390 Unspecified dementia without behavioral disturbance: Secondary | ICD-10-CM | POA: Diagnosis not present

## 2022-07-03 DIAGNOSIS — Z87891 Personal history of nicotine dependence: Secondary | ICD-10-CM | POA: Diagnosis not present

## 2022-07-03 DIAGNOSIS — I459 Conduction disorder, unspecified: Principal | ICD-10-CM | POA: Diagnosis present

## 2022-07-03 DIAGNOSIS — E119 Type 2 diabetes mellitus without complications: Secondary | ICD-10-CM

## 2022-07-03 DIAGNOSIS — E876 Hypokalemia: Secondary | ICD-10-CM | POA: Diagnosis not present

## 2022-07-03 DIAGNOSIS — Z79899 Other long term (current) drug therapy: Secondary | ICD-10-CM

## 2022-07-03 DIAGNOSIS — B961 Klebsiella pneumoniae [K. pneumoniae] as the cause of diseases classified elsewhere: Secondary | ICD-10-CM | POA: Diagnosis not present

## 2022-07-03 DIAGNOSIS — E1169 Type 2 diabetes mellitus with other specified complication: Secondary | ICD-10-CM | POA: Insufficient documentation

## 2022-07-03 DIAGNOSIS — Z7982 Long term (current) use of aspirin: Secondary | ICD-10-CM

## 2022-07-03 DIAGNOSIS — R9431 Abnormal electrocardiogram [ECG] [EKG]: Secondary | ICD-10-CM | POA: Diagnosis not present

## 2022-07-03 DIAGNOSIS — E785 Hyperlipidemia, unspecified: Secondary | ICD-10-CM | POA: Diagnosis not present

## 2022-07-03 DIAGNOSIS — I4589 Other specified conduction disorders: Secondary | ICD-10-CM | POA: Diagnosis not present

## 2022-07-03 DIAGNOSIS — Z7189 Other specified counseling: Secondary | ICD-10-CM

## 2022-07-03 HISTORY — DX: Gastro-esophageal reflux disease without esophagitis: K21.9

## 2022-07-03 LAB — CBC WITH DIFFERENTIAL/PLATELET
Abs Immature Granulocytes: 0.01 10*3/uL (ref 0.00–0.07)
Basophils Absolute: 0.1 10*3/uL (ref 0.0–0.1)
Basophils Relative: 1 %
Eosinophils Absolute: 0.3 10*3/uL (ref 0.0–0.5)
Eosinophils Relative: 5 %
HCT: 45.7 % (ref 39.0–52.0)
Hemoglobin: 14.8 g/dL (ref 13.0–17.0)
Immature Granulocytes: 0 %
Lymphocytes Relative: 45 %
Lymphs Abs: 2.5 10*3/uL (ref 0.7–4.0)
MCH: 30 pg (ref 26.0–34.0)
MCHC: 32.4 g/dL (ref 30.0–36.0)
MCV: 92.7 fL (ref 80.0–100.0)
Monocytes Absolute: 0.7 10*3/uL (ref 0.1–1.0)
Monocytes Relative: 13 %
Neutro Abs: 2 10*3/uL (ref 1.7–7.7)
Neutrophils Relative %: 36 %
Platelets: 257 10*3/uL (ref 150–400)
RBC: 4.93 MIL/uL (ref 4.22–5.81)
RDW: 12.9 % (ref 11.5–15.5)
WBC: 5.6 10*3/uL (ref 4.0–10.5)
nRBC: 0 % (ref 0.0–0.2)

## 2022-07-03 LAB — MAGNESIUM: Magnesium: 2.1 mg/dL (ref 1.7–2.4)

## 2022-07-03 LAB — HEMOGLOBIN A1C
Hgb A1c MFr Bld: 5.6 % (ref 4.8–5.6)
Mean Plasma Glucose: 114.02 mg/dL

## 2022-07-03 LAB — TROPONIN I (HIGH SENSITIVITY)
Troponin I (High Sensitivity): 26 ng/L — ABNORMAL HIGH
Troponin I (High Sensitivity): 23 ng/L — ABNORMAL HIGH (ref <18)

## 2022-07-03 LAB — BASIC METABOLIC PANEL
Anion gap: 7 (ref 5–15)
BUN: 23 mg/dL (ref 8–23)
CO2: 27 mmol/L (ref 22–32)
Calcium: 9.1 mg/dL (ref 8.9–10.3)
Chloride: 107 mmol/L (ref 98–111)
Creatinine, Ser: 1.2 mg/dL (ref 0.61–1.24)
GFR, Estimated: >60 mL/min (ref >60)
Glucose, Bld: 97 mg/dL (ref 70–99)
Potassium: 3.9 mmol/L (ref 3.5–5.1)
Sodium: 141 mmol/L (ref 135–145)

## 2022-07-03 LAB — APTT: aPTT: 36 s (ref 24–36)

## 2022-07-03 LAB — PROTIME-INR
INR: 1.1 (ref 0.8–1.2)
Prothrombin Time: 14.5 s (ref 11.4–15.2)

## 2022-07-03 LAB — GLUCOSE, CAPILLARY
Glucose-Capillary: 89 mg/dL (ref 70–99)
Glucose-Capillary: 97 mg/dL (ref 70–99)

## 2022-07-03 LAB — CBG MONITORING, ED
Glucose-Capillary: 102 mg/dL — ABNORMAL HIGH (ref 70–99)
Glucose-Capillary: 91 mg/dL (ref 70–99)

## 2022-07-03 LAB — TSH: TSH: 0.655 u[IU]/mL (ref 0.350–4.500)

## 2022-07-03 MED ORDER — ACETAMINOPHEN 325 MG PO TABS
650.0000 mg | ORAL_TABLET | Freq: Four times a day (QID) | ORAL | Status: DC | PRN
  Administered 2022-07-06: 650 mg via ORAL
  Filled 2022-07-03: qty 2

## 2022-07-03 MED ORDER — QUETIAPINE FUMARATE 25 MG PO TABS
25.0000 mg | ORAL_TABLET | Freq: Every day | ORAL | Status: DC
  Administered 2022-07-03 – 2022-07-06 (×4): 25 mg via ORAL
  Filled 2022-07-03 (×4): qty 1

## 2022-07-03 MED ORDER — INSULIN ASPART 100 UNIT/ML IJ SOLN
0.0000 [IU] | Freq: Three times a day (TID) | INTRAMUSCULAR | Status: DC
  Administered 2022-07-05: 2 [IU] via SUBCUTANEOUS
  Administered 2022-07-05 – 2022-07-06 (×2): 1 [IU] via SUBCUTANEOUS
  Administered 2022-07-06: 3 [IU] via SUBCUTANEOUS
  Administered 2022-07-07: 1 [IU] via SUBCUTANEOUS
  Filled 2022-07-03: qty 0.09

## 2022-07-03 MED ORDER — ACETAMINOPHEN 650 MG RE SUPP: 650.0000 mg | Freq: Four times a day (QID) | RECTAL | Status: DC | PRN

## 2022-07-03 MED ORDER — MIRTAZAPINE 15 MG PO TABS
15.0000 mg | ORAL_TABLET | Freq: Every day | ORAL | Status: DC
  Administered 2022-07-03 – 2022-07-06 (×4): 15 mg via ORAL
  Filled 2022-07-03 (×5): qty 1

## 2022-07-03 MED ORDER — PANTOPRAZOLE SODIUM 40 MG PO TBEC
40.0000 mg | DELAYED_RELEASE_TABLET | Freq: Two times a day (BID) | ORAL | Status: DC
  Administered 2022-07-03 – 2022-07-07 (×8): 40 mg via ORAL
  Filled 2022-07-03 (×10): qty 1

## 2022-07-03 MED ORDER — POLYETHYLENE GLYCOL 3350 17 G PO PACK: 17.0000 g | PACK | Freq: Every day | ORAL | Status: DC | PRN

## 2022-07-03 MED ORDER — QUETIAPINE FUMARATE 25 MG PO TABS
25.0000 mg | ORAL_TABLET | Freq: Every day | ORAL | Status: DC
  Filled 2022-07-03: qty 1

## 2022-07-03 MED ORDER — LORAZEPAM 2 MG/ML IJ SOLN
0.5000 mg | Freq: Once | INTRAMUSCULAR | Status: AC
Start: 2022-07-03 — End: 2022-07-03
  Administered 2022-07-03: 0.5 mg via INTRAVENOUS
  Filled 2022-07-03: qty 1

## 2022-07-03 MED ORDER — ONDANSETRON HCL 4 MG PO TABS: 4.0000 mg | ORAL_TABLET | Freq: Four times a day (QID) | ORAL | Status: DC | PRN

## 2022-07-03 MED ORDER — ONDANSETRON HCL 4 MG/2ML IJ SOLN: 4.0000 mg | Freq: Four times a day (QID) | INTRAMUSCULAR | Status: DC | PRN

## 2022-07-03 NOTE — Progress Notes (Signed)
Mobility Specialist Progress Note    07/03/22 1549  Mobility  Activity Ambulated with assistance in hallway  Level of Assistance Contact guard assist, steadying assist  Assistive Device Front wheel walker  Distance Ambulated (ft) 100 ft  Activity Response Tolerated well  $Mobility charge 1 Mobility   Pt received trying to get out of bed. No complaints. Stated he thinks he is in [place] and needs to go home. Returned to chair with NT present and chair alarm and belt on.   Insurance account manager

## 2022-07-03 NOTE — Progress Notes (Signed)
Pt becoming increasingly agitated wanting to leave hospital. Tried to redirect, head start belt on in chair. Pt cont to try to make staff let him leave, would not stay seated in seat. Attempted to try to get pt back to bed and pt started trying to hit at staff. Security called. Veneta Penton RN notified Dr. Erlinda Hong and one time dose ativan ordered. RN attempted to call wife without answer. Carroll Kinds RN

## 2022-07-03 NOTE — ED Triage Notes (Signed)
Patients wife said that he had a 24/7 exterior heart monitor placed yesterday at the doctor. She received a phone call 1 hour ago from the company who said to bring him to the ED now. They said to call [phone]. Patient is not having chest pain or shortness of breath. Patient takes a baby aspirin. Wife thinks he had a history of blood clot but not sure.

## 2022-07-03 NOTE — ED Notes (Signed)
Patients heart rate dropped to 34 beats per minute. Patient not having any chest discomfort or shortness of breath. Dr. Dina Rich aware. New EKG captured.

## 2022-07-03 NOTE — Progress Notes (Addendum)
Patient admitted early this morning, transferred from [facility] to [facility] for cardiology consult, detail please see HPI. He has dementia, is confused, though able to ambulate.  Currently vital signs stable, wife at bedside, updated. We will follow cardiology recommendation

## 2022-07-03 NOTE — Assessment & Plan Note (Signed)
   Patient has not been on full dose anticoagulation for at least 2 months due to bleeding complications such as severe epistaxis and has instead been using aspirin 81 mg daily  Patient additionally is not on AV nodal blocking therapy and typically is rate controlled  Continue to monitor patient on telemetry

## 2022-07-03 NOTE — Consult Note (Addendum)
Cardiology Consultation:   Patient ID: [name] MRN: [id number]; DOB: 16-May-1941  Admit date: 07/03/2022 Date of Consult: 07/03/2022  PCP:  Kathyrn Lass, MD   [facility] Providers Cardiologist:  Sinclair Grooms, MD  Electrophysiologist:  Vickie Epley, MD  {  Patient Profile:   [name] is a 81 y.o. male with a history of paroxysmal atrial fibrillation not on anticoagulation due to frequent nosebleeds, hypertension, hyperlipidemia, type 2 diabetes, ADD, and dementia who is being seen 07/03/2022 for the evaluation of complete heart block at the request of Dr. Cyd Silence.  History of Present Illness:   Mr. [name] is a 81 year old male with the above history who is followed by Dr. Tamala Julian and Dr. Quentin Ore.  He has a history of paroxysmal atrial fibrillation but is been unable to tolerate anticoagulation due to frequent nosebleeds.  He has been on Aspirin instead. He was recently seen by Dr. Quentin Ore on 06/13/2022 and decision was made to proceed with Watchman procedure.  An echo was ordered at that visit and completed yesterday.  This showed LVEF of 60-65% with normal wall motion and diastolic parameters, normal RV, moderate with a dilated left atrium, aortic valve sclerosis but no evidence of aortic stenosis.  He was last seen by Dr. Tamala Julian on 06/21/2022 at which time patient's wife had decided against proceeding with the Watchman procedure.  A 30-day event monitor was ordered to assess atrial fibrillation burden.  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  Initial EKG in the ED showed normal sinus rhythm with a rate of 69 bpm.  However, he has been noted to have transient episodes of bradycardia with high-grade AV block.  Repeat EKG at 4 AM does shows what looks like a  junctional rhythm with bigeminy non-conducted PAC and a ventricular rate of 33 bpm. WBC 5.6, Hgb 14.8, Plts 257. Na 141, K 3.9, Gluose 97, BUN 23, Cr. 120. Magnesium 2.1. He was admitted and Cardiology was consulted for further evaluation.  At the time of this evaluation, patient is resting comfortably in no acute distress.  Has underlying dementia so wife assists with history.  He denies any lightheadedness, dizziness, syncope.  No palpitations.  He does report some occasional atypical chest pain that he has difficulty describing.  However, he states that this last for 1 to 2 seconds at a time and then resolves.  He denies any exertional chest pain.  This does not sound like cardiac chest pain.  He also describes some dyspnea on exertion which she states is not new and is stable.  No shortness of breath at rest.  No orthopnea or PND.  Lower extremity edema on exam.  He denies any recent fevers or illnesses.  No abnormal bleeding in urine or stools.  Reviewed monitor strip, EKG, and telemetry strips with Tommye Standard, PA-C with EP, who felt it looked like patient was having second-degree AV block Mobitz type I with variable degree of AV block with full degree of 2-1 AV block and suggestion of complete heart block.  He is also having some underlying junctional rhythm with some atrial bigeminy as well.   Past Medical History:  Diagnosis Date   Arthritis    Attention deficit disorder    Blind left eye  Diabetes mellitus without complication ([place])    Hyperlipidemia    Hypertension     Past Surgical History:  Procedure Laterality Date   EYE SURGERY Left    x3   KNEE ARTHROSCOPY Bilateral 03/29/2014   Procedure: RIGHT KNEE ARTHROSCOPY AND DEBRIDEMENT, LEFT KNEE ASPIRATION,  LEFT KNEE ARTHROSCOPY AND DEBRIDEMENT.;  Surgeon: Meredith Pel, MD;  Location: [place];  Service: Orthopedics;  Laterality: Bilateral;  RIGHT KNEE ARTHROSCOPY AND DEBRIDEMENT, LEFT KNEE ASPIRATION,  LEFT KNEE ARTHROSCOPY AND  DEBRIDEMENT.   KNEE ARTHROTOMY Bilateral 04/26/2014   Procedure: KNEE ARTHROTOMY, SYNOVECTOMY, ANTIBIOTIC BEAD PLACEMENT.;  Surgeon: Meredith Pel, MD;  Location: [place];  Service: Orthopedics;  Laterality: Bilateral;   lipoma removal, right upper back     TONSILLECTOMY     TOTAL HIP ARTHROPLASTY Left 09/14/2019   TOTAL HIP ARTHROPLASTY Left 09/14/2019   Procedure: LEFT TOTAL HIP ARTHROPLASTY ANTERIOR APPROACH;  Surgeon: Meredith Pel, MD;  Location: [place];  Service: Orthopedics;  Laterality: Left;     Home Medications:  Prior to Admission medications   Medication Sig Start Date End Date Taking? Authorizing Provider  aspirin EC 81 MG tablet Take 1 tablet (81 mg total) by mouth daily. Swallow whole. 06/21/22  Yes Belva Crome, MD  mirtazapine (REMERON) 15 MG tablet Take 15 mg by mouth at bedtime. 05/17/22  Yes [provider]  omeprazole (PRILOSEC) 20 MG capsule Take 20 mg by mouth 2 (two) times daily before a meal. 04/19/22  Yes [provider]  ELIQUIS 5 MG TABS tablet Take 5 mg by mouth 2 (two) times daily. Patient not taking: Reported on 07/03/2022 06/21/22   [provider]    Inpatient Medications: Scheduled Meds:  insulin aspart  0-9 Units Subcutaneous TID AC & HS   mirtazapine  15 mg Oral QHS   pantoprazole  40 mg Oral BID AC   Continuous Infusions:  PRN Meds: acetaminophen **OR** acetaminophen, ondansetron **OR** ondansetron (ZOFRAN) IV, polyethylene glycol  Allergies:   No Known Allergies  Social History:   Social History   Socioeconomic History   Marital status: Married    Spouse name: Not on file   Number of children: Not on file   Years of education: Not on file   Highest education level: Not on file  Occupational History   Not on file  Tobacco Use   Smoking status: Former   Smokeless tobacco: Never  Vaping Use   Vaping Use: Never used  Substance and Sexual Activity   Alcohol use: Yes    Comment: occasional    Drug use: No    Sexual activity: Not on file  Other Topics Concern   Not on file  Social History Narrative   Not on file   Social Determinants of Health   Financial Resource Strain: Not on file  Food Insecurity: Not on file  Transportation Needs: Not on file  Physical Activity: Not on file  Stress: Not on file  Social Connections: Not on file  Intimate Partner Violence: Not on file    Family History:   Family History  Problem Relation Age of Onset   Heart attack Father    Heart attack Sister    Heart attack Brother      ROS:  Please see the history of present illness.  Review of Systems  Constitutional:  Negative for chills and fever.  HENT:  Negative for congestion. Nosebleeds: when he was on Eliquis.  Respiratory:  Positive for shortness of breath.  Negative for cough.   Cardiovascular:  Positive for chest pain. Negative for palpitations, orthopnea and PND.  Gastrointestinal:  Negative for blood in stool, melena, nausea and vomiting.  Genitourinary:  Negative for hematuria.  Musculoskeletal:  Negative for myalgias.  Neurological:  Positive for headaches (occasionally). Negative for dizziness and loss of consciousness.  Endo/Heme/Allergies:  Does not bruise/bleed easily.  Psychiatric/Behavioral:  Negative for substance abuse.    Physical Exam/Data:   Vitals:   07/03/22 0500 07/03/22 0515 07/03/22 0600 07/03/22 0700  BP: 119/66 126/67 126/68 138/78  Pulse: (!) 37 (!) 36 (!) 39 75  Resp: '19 17 18 '$ (!) 25  Temp: 98.2 F (36.8 C)     TempSrc:      SpO2: 100% 100% 100% 100%  Weight:      Height:       No intake or output data in the 24 hours ending 07/03/22 0711    07/03/2022    1:33 AM 06/21/2022    2:57 PM 06/13/2022   10:25 AM  Last 3 Weights  Weight (lbs) 157 lb 153 lb 9.6 oz 157 lb  Weight (kg) 71.215 kg 69.673 kg 71.215 kg     Body mass index is 23.87 kg/m.  General: 81 y.o. thin African-American male resting comfortably in no acute distress. HEENT: Normocephalic and  atraumatic. Sclera clear.  Neck: Supple. No JVD. Heart: Bradycardic with irregular rhythm. Distinct S1 and S2. No murmurs, gallops, or rubs. Radial  pulses 2+ and equal bilaterally. Lungs: No increased work of breathing. Clear to ausculation bilaterally. No wheezes, rhonchi, or rales.  Abdomen: Soft, non-distended, and non-tender to palpation.  Extremities: No lower extremity edema.    Skin: Warm and dry. Neuro: No focal deficits. Underlying dementia. Psych: Normal affect.    EKG:  The EKG was personally reviewed and demonstrates:   -EKG on 07/03/2022 at 1:32 AM showed normal sinus rhythm, rate 69 bpm, with borderline first-degree AV block (PR interval 201 ms) no acute ST/T changes compared to prior tracing. -EKG on 07/03/2022 at 3:04 AM showed normal sinus rhythm, rate 62 bpm, with first-degree AV block (PR interval 232 ms). -EKG on 07/03/2022 at 3:14 AM showed normal sinus rhythm, rate 58 bpm, the looks like nonconducted PACs. -EGD on 07/03/2022 at 4 AM showed junctional escape rhythm with looks like possible bigeminy nonconducted PACs and a rate of 33 bpm.  Telemetry:  Telemetry was personally reviewed and demonstrates:  At least second-degree AV block Mobitz type I with variable degree of AV block with full degree of 2-1 AV block and suggestion of complete heart block.  He is also having some underlying junctional rhythm with some atrial bigeminy as well. Rates ranging from the 30s to 70s.  Relevant CV Studies:  Echocardiogram 07/02/2022: Impressions:  1. Left ventricular ejection fraction, by estimation, is 60 to 65%. The  left ventricle has normal function. The left ventricle has no regional  wall motion abnormalities. Left ventricular diastolic parameters were  normal.   2. Right ventricular systolic function is normal. The right ventricular  size is normal.   3. Left atrial size was moderately dilated.   4. The mitral valve is abnormal. Trivial mitral valve regurgitation. No   evidence of mitral stenosis.   5. The aortic valve is tricuspid. There is mild calcification of the  aortic valve. There is mild thickening of the aortic valve. Aortic valve  regurgitation is not visualized. Aortic valve sclerosis is present, with  no evidence of aortic valve stenosis.  6. The inferior vena cava is normal in size with greater than 50%  respiratory variability, suggesting right atrial pressure of 3 mmHg.  Laboratory Data:  High Sensitivity Troponin:  No results for input(s): "TROPONINIHS" in the last 720 hours.   Chemistry Recent Labs  Lab 07/03/22 0304  NA 141  K 3.9  CL 107  CO2 27  GLUCOSE 97  BUN 23  CREATININE 1.20  CALCIUM 9.1  MG 2.1  GFRNONAA >60  ANIONGAP 7    No results for input(s): "PROT", "ALBUMIN", "AST", "ALT", "ALKPHOS", "BILITOT" in the last 168 hours. Lipids No results for input(s): "CHOL", "TRIG", "HDL", "LABVLDL", "LDLCALC", "CHOLHDL" in the last 168 hours.  Hematology Recent Labs  Lab 07/03/22 0304  WBC 5.6  RBC 4.93  HGB 14.8  HCT 45.7  MCV 92.7  MCH 30.0  MCHC 32.4  RDW 12.9  PLT 257   Thyroid No results for input(s): "TSH", "FREET4" in the last 168 hours.  BNPNo results for input(s): "BNP", "PROBNP" in the last 168 hours.  DDimer No results for input(s): "DDIMER" in the last 168 hours.   Radiology/Studies:  ECHOCARDIOGRAM COMPLETE  Result Date: 07/02/2022    ECHOCARDIOGRAM REPORT   Patient Name:   SEKAI GITLIN Date of Exam: 07/02/2022 Medical Rec #:  [id number]        Height:       68.0 in Accession #:    [id number]       Weight:       153.6 lb Date of Birth:  24-Nov-1940         BSA:          1.827 m Patient Age:    67 years         BP:           110/62 mmHg Patient Gender: M                HR:           74 bpm. Exam Location:  [place] Procedure: 2D Echo, Cardiac Doppler and Color Doppler Indications:    I48.0 Paroxysmal atrial fibrillation  History:        Patient has prior history of Echocardiogram examinations,  most                 recent 11/03/2019. Pre-op evaluation, Arrythmias:Paroxysmal                 atrial fibrillation; Risk Factors:Hypertension and Dyslipidemia.  Sonographer:    Coralyn Helling RDCS Referring Phys: [id number] [place]  1. Left ventricular ejection fraction, by estimation, is 60 to 65%. The left ventricle has normal function. The left ventricle has no regional wall motion abnormalities. Left ventricular diastolic parameters were normal.  2. Right ventricular systolic function is normal. The right ventricular size is normal.  3. Left atrial size was moderately dilated.  4. The mitral valve is abnormal. Trivial mitral valve regurgitation. No evidence of mitral stenosis.  5. The aortic valve is tricuspid. There is mild calcification of the aortic valve. There is mild thickening of the aortic valve. Aortic valve regurgitation is not visualized. Aortic valve sclerosis is present, with no evidence of aortic valve stenosis.  6. The inferior vena cava is normal in size with greater than 50% respiratory variability, suggesting right atrial pressure of 3 mmHg. FINDINGS  Left Ventricle: Left ventricular ejection fraction, by estimation, is 60 to 65%. The left ventricle has normal function. The left ventricle has  no regional wall motion abnormalities. The left ventricular internal cavity size was normal in size. There is  no left ventricular hypertrophy. Left ventricular diastolic parameters were normal. Right Ventricle: The right ventricular size is normal. No increase in right ventricular wall thickness. Right ventricular systolic function is normal. Left Atrium: Left atrial size was moderately dilated. Right Atrium: Right atrial size was normal in size. Pericardium: There is no evidence of pericardial effusion. Mitral Valve: The mitral valve is abnormal. There is mild thickening of the mitral valve leaflet(s). There is mild calcification of the mitral valve leaflet(s). Mild mitral annular  calcification. Trivial mitral valve regurgitation. No evidence of mitral valve stenosis. Tricuspid Valve: The tricuspid valve is normal in structure. Tricuspid valve regurgitation is not demonstrated. No evidence of tricuspid stenosis. Aortic Valve: The aortic valve is tricuspid. There is mild calcification of the aortic valve. There is mild thickening of the aortic valve. Aortic valve regurgitation is not visualized. Aortic regurgitation PHT measures 427 msec. Aortic valve sclerosis is present, with no evidence of aortic valve stenosis. Pulmonic Valve: The pulmonic valve was normal in structure. Pulmonic valve regurgitation is not visualized. No evidence of pulmonic stenosis. Aorta: The aortic root is normal in size and structure. Venous: The inferior vena cava is normal in size with greater than 50% respiratory variability, suggesting right atrial pressure of 3 mmHg. IAS/Shunts: The interatrial septum was not well visualized.  LEFT VENTRICLE PLAX 2D LVIDd:         3.60 cm   Diastology LVIDs:         2.40 cm   LV e' medial:    6.20 cm/s LV PW:         1.00 cm   LV E/e' medial:  14.9 LV IVS:        1.10 cm   LV e' lateral:   9.68 cm/s LVOT diam:     2.00 cm   LV E/e' lateral: 9.5 LV SV:         76 LV SV Index:   41 LVOT Area:     3.14 cm  RIGHT VENTRICLE             IVC RV S prime:     14.00 cm/s  IVC diam: 0.70 cm TAPSE (M-mode): 1.7 cm RVSP:           31.1 mmHg LEFT ATRIUM             Index        RIGHT ATRIUM           Index LA diam:        4.60 cm 2.52 cm/m   RA Pressure: 3.00 mmHg LA Vol (A2C):   39.7 ml 21.70 ml/m  RA Area:     14.90 cm LA Vol (A4C):   42.8 ml 23.43 ml/m  RA Volume:   41.80 ml  22.88 ml/m LA Biplane Vol: 43.0 ml 23.54 ml/m  AORTIC VALVE LVOT Vmax:   120.00 cm/s LVOT Vmean:  77.100 cm/s LVOT VTI:    0.241 m AI PHT:      427 msec  AORTA Ao Root diam: 3.70 cm Ao Asc diam:  3.30 cm MITRAL VALVE                TRICUSPID VALVE MV Area (PHT): 2.71 cm     TR Peak grad:   28.1 mmHg MV Decel  Time: 280 msec     TR Vmax:  265.00 cm/s MV E velocity: 92.20 cm/s   Estimated RAP:  3.00 mmHg MV A velocity: 114.00 cm/s  RVSP:           31.1 mmHg MV E/A ratio:  0.81                             SHUNTS                             Systemic VTI:  0.24 m                             Systemic Diam: 2.00 cm Jenkins Rouge MD Electronically signed by Jenkins Rouge MD Signature Date/Time: 07/02/2022/10:14:39 AM    Final      Assessment and Plan:   Complete Heart Block  Patient had outpatient 30-day event monitor placed yesterday to assess atrial fibrillation burden and if reportedly showed complete heart block and second-degree AV block Mobitz type II overnight..  Patient was advised to come to the ED for further evaluation. Echo yesterday showed normal LV function.  Potassium and magnesium within normal limits.  He is not on any AV nodal agents at home. - I personally reviewed monitor strips, EKG, and telemetry strips with Tommye Standard, PA-C with EP, who who felt it looked like patient was having second-degree AV block Mobitz type I with variable degree of AV block with full degree of 2-1 AV block and suggestion of complete heart block.  He is also having some underlying junctional rhythm with some atrial bigeminy as well. - Patient is asymptomatic with this. - Continue to avoid AV nodal agents.  - TSH normal - Given no clear reversible cause, recommend transfer patient to [facility] for EP consult and consideration of PPM.   Paroxysmal Atrial Fibrillation Patient has a history of paroxysmal atrial fibrillation but has been unable to tolerate anticoagulation due to frequent nosebleeds.  He was recently seen by Dr. Quentin Ore and there were initial plans for Watchman procedure but then wife decided against this.  30-day event monitor was placed yesterday to assess atrial fibrillation burden and help guide anticoagulation therapy. - Maintaining sinus rhythm at this time. - No AV nodal agents due to  intermittent bradycardia as above. - Continue Aspirin 81 mg daily for now.  Elevated Troponin   troponin 26 >23. EKG shows no acute ischemic changes compared to prior tracings. Echo yesterday showed normal LV function and normal wall motion. - Patient describes some very atypical chest pain that only lasts for 1-2 seconds at a time at rest and then resolves. No exertional chest pain. This does not sounds like cardiac chest pain.  Troponins flat, not consistent with ACS.  No ischemic evaluation recommended  Hypertension BP mostly well controlled. - Not on any antihypertensives. - Continue to monitor.  Hyperlipidemia History of hyperlipidemia listed in his chart but does not appear to be on any medical therapy for this.  Type 2 Diabetes Mellitus - Management per primary team.  Risk Assessment/Risk Scores:   CHA2DS2-VASc Score = 5  This indicates a 7.2% annual risk of stroke. The patient's score is based upon: CHF History: 0 HTN History: 1 Diabetes History: 1 Stroke History: 0 Vascular Disease History: 1 Age Score: 2 Gender Score: 0     For questions or updates, please contact [facility] Please consult [email or web address]  for contact info under    Signed, Darreld Mclean, PA-C  07/03/2022 7:11 AM   Patient seen and examined.  Agree with above documentation.  Mr. Simonis is an 81 year old male with a history of paroxysmal atrial fibrillation, T2DM, hypertension, hyperlipidemia, dementia who we are consulted by Dr. Marlyce Huge for evaluation of complete heart block.  He has a history of atrial fibrillation but has not been on anticoagulation due to frequent epistaxis.  He has been evaluated by Dr. Quentin Ore for [facility], but patient had decided not to have procedure done.  He had an echocardiogram yesterday which showed normal biventricular function, no significant valvular disease.  He was currently wearing a 30-day monitor to assess A-fib burden and was found to have evidence of  complete heart block and second-degree AV block Mobitz type II.  He was advised to come to the ED.  In the ED, initial vital signs notable for BP 132/77, pulse 68, SPO2 100% on room air.  Labs notable for creatinine 1.2, troponin 26 > 23, hemoglobin 14.8, WBC 5.6, and TSH 0.7.  EKG shows A-V dissociation, junctional escape rhythm, rate 33. Telemetry shows currently in sinus rhythm in 70s but had periods of Mobitz type I AV block, Mobitz type II AV block, as well as periods of complete heart block with junctional escape rhythm in the 30s.  On exam, patient is alert and oriented x2 (did not know year), regular rate and rhythm, no murmurs, lungs CTAB, no LE edema.  Recommend transfer to [facility] for evaluation by EP for pacemaker.  Donato Heinz, MD

## 2022-07-03 NOTE — Assessment & Plan Note (Signed)
.   Patient been placed on Accu-Cheks before every meal and nightly with sliding scale insulin . Hemoglobin A1C ordered . Diabetic Diet

## 2022-07-03 NOTE — Progress Notes (Signed)
PROGRESS NOTE  GLEN KESINGER  DOB: 26-Aug-1941  PCP: Kathyrn Lass, MD PNT:[id number]  DOA: 07/03/2022  LOS: 0 days  Hospital Day: 1  Brief narrative: [name] is a 81 y.o. male with PMH significant for DM2, HTN, HLD, paroxysmal A-fib not on anticoagulation because of history of multiple bleeding complications, left eye blindness, remote history of septic arthritis of the knees (2015).  Patient presented to ED with concern of complete heart block. Patient was recently evaluated as an outpatient by Dr. Tamala Julian with cardiology to determine if he was a Watchman device candidate.  Patient was felt to not be an optimal candidate for this procedure and the decision was made for the patient to be sent home with an event monitor for 30 days to evaluate his A-fib burden.  On 8/15, heart monitor service detected complete heart block and instructed the patient to go to the ED.  Patient denies any associated symptoms of lightheadedness, chest pain, diaphoresis, shortness of breath but patient progressively is more weak in the past several weeks to months.    In the ED, patient's heart rate was in 30s.  EKG showed secondary Mobitz type I block. EDP discussed case with the cardiology fellow Dr. Jannifer Franklin who recommended evaluation by EP at [facility]  Patient was admitted to [facility] service. Because of bed scarcity at [facility], patient was kept at [facility].  Subjective: Patient was seen and examined this morning.  Elderly African-American male.  Propped up in bed.  Not in distress.  Wife at bedside. Chart reviewed Heart rate in 30s overnight, blood pressure stable, breathing on room air Labs remain unremarkable  Assessment and plan: Complete heart block  30-day event monitor noted complete heart block and hence brought in for evaluation  EKG in the ED showed sinus bradycardia as well as second-degree Mobitz 1 and Mobitz 2 block  Symptomatic with generalized weakness but otherwise no cardiac  symptoms.   Pending transfer to [facility] and EP evaluation Patient is not currently on any AV nodal blocking agents Continue to monitor patient on telemetry N.p.o. for now until cardiology can provide input this morning   Paroxysmal atrial fibrillation  Not on AV nodal blocking agent.  Continue to monitor patient on telemetry Patient has not been on full dose anticoagulation for at least 2 months due to bleeding complications such as severe epistaxis and has instead been using aspirin 81 mg daily  Type 2 diabetes mellitus A1c 6.61 2020 PTA not on any antidiabetic medication Currently on sliding scale insulin with Accu-Cheks Recent Labs  Lab 07/03/22 0822 07/03/22 1220  GLUCAP 102* 91   GERD PPI    Goals of care   Code Status: Full Code    Mobility: Encourage ambulation  Skin assessment:     Nutritional status:  Body mass index is 23.87 kg/m.          Diet:  Diet Order             Diet Heart Room service appropriate? Yes; Fluid consistency: Thin  Diet effective now                   DVT prophylaxis:  SCDs Start: 07/03/22 0655   Antimicrobials: None Fluid: None Consultants: Cardiology Family Communication: Wife advised  Status is: Observation  Continue in-hospital care because: Needs pacemaker placement Level of care: Progressive   Dispo: The patient is from: Home  Anticipated d/c is to: Pending clinical course              Patient currently is not medically stable to d/c.   Difficult to place patient No     Infusions:    Scheduled Meds:  insulin aspart  0-9 Units Subcutaneous TID AC & HS   mirtazapine  15 mg Oral QHS   pantoprazole  40 mg Oral BID AC    PRN meds: acetaminophen **OR** acetaminophen, ondansetron **OR** ondansetron (ZOFRAN) IV, polyethylene glycol   Antimicrobials: Anti-infectives (From admission, onward)    None       Objective: Vitals:   07/03/22 1200 07/03/22 1256  BP: (!) 154/66 (!)  112/54  Pulse: 88 70  Resp: (!) 21 18  Temp:    SpO2: 100% 100%   No intake or output data in the 24 hours ending 07/03/22 1331 Filed Weights   07/03/22 0133  Weight: 71.2 kg   Weight change:  Body mass index is 23.87 kg/m.   Physical Exam: General exam: Pleasant, elderly African-American male.  Not in physical distress Skin: No rashes, lesions or ulcers. HEENT: Atraumatic, normocephalic, no obvious bleeding Lungs: Clear to auscultate bilaterally CVS: Slow heart rate with second-degree AV block and monitor.  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  Not restless or agitated at the time of my evaluation Psychiatry: Sad affect Extremities: No pedal edema, no calf tenderness  Data Review: I have personally reviewed the laboratory data and studies available.  F/u labs ordered Unresulted Labs (From admission, onward)    None       Signed, Terrilee Croak, MD [facility] 07/03/2022

## 2022-07-03 NOTE — Telephone Encounter (Signed)
   Cardiac Monitor Alert  Date of alert:  07/03/2022   Patient Name: [name]  DOB: [birth year]  MRN: [id number]   [facility] Cardiologist: Sinclair Grooms, MD  [facility] EP:  Dr. Edmonia James, DOD  Monitor Information: Cardiac Event Monitor [[employer]]  Reason:  2:1 Heart Block Critical Event Monitor report  Ordering provider:  Daneen Schick III, MD   Alert 2:1 wenckebach heart block This is the 1st and 2nd alert for this rhythm.   Next Cardiology Appointment   Date:  07/03/22  Provider:  DOD, Dr. Johnsie Cancel   The patient could NOT be reached by telephone today.  PT IS CURRENTLY IN THE [facility] ER;  Pt checked in at 122 am on 07/03/22;  Cardiology has already been consulted, Dr. Gilman Schmidt ( D ) will see this patient.   Arrhythmia, symptoms and history reviewed with Dr. Johnsie Cancel.   Plan:  ER CARE, Cardiology Consult underway.    Other: Pt checked into the [facility]  per [employer] on call team recommendations.  Pt checked into ER 122 am on 07/03/22; Dr. Johnsie Cancel confirmed that Cardiology D-  Dr. Gilman Schmidt will see patient, Cardiology consult moving forward.  No f/u required regarding this report.    Varney Daily, RN  07/03/2022 8:37 AM

## 2022-07-03 NOTE — Telephone Encounter (Signed)
Calling with critical EKG results for pt. Call transferred

## 2022-07-03 NOTE — Assessment & Plan Note (Deleted)
   Documented history of HTN , not on maintenance antihypertensivse

## 2022-07-03 NOTE — Assessment & Plan Note (Signed)
Continuing home regimen of daily PPI therapy.  

## 2022-07-03 NOTE — Assessment & Plan Note (Addendum)
   Patient experienced a stretch of what appeared to be complete heart block the evening of 8/15 on the event monitor  Patient reports no associated symptoms although patient was sleeping at the time  Review of telemetry in the emergency department reveals patient going inbetween sinus bradycardia, second-degree Mobitz 1 and Mobitz 2 block with patient remaining asymptomatic  EDP discussed case with Dr. Jannifer Franklin the cardiology fellow overnight who recommended admitting the patient to [facility] for electrophysiology evaluation for consideration of pacemaker  Patient is not currently on any AV nodal blocking agents  Continue to monitor patient on telemetry  N.p.o. for now until cardiology can provide input this morning  Bed request placed for progressive bed at [facility]

## 2022-07-03 NOTE — ED Provider Notes (Signed)
[facility] DEPT Provider Note   CSN: [id number] Arrival date & time: 07/03/22  0122     History  Chief Complaint  Patient presents with   Heart Monitor Complication    [name] is a 81 y.o. male.  HPI      This is an 81 year old male who presents with concerns for abnormal heart rhythm.  Patient is currently wearing a heart monitor.  This was placed as a 30-day heart monitor to assess for burden of atrial fibrillation.  Tonight he received a call from the monitoring service stating that he needs to come to the emergency department.  Faxed information from that service indicates that he went into complete third-degree AV block in addition to second-degree heart block type II.  This was at approximately 9:57 PM.  Reports that he was asymptomatic at that time.  Right states that he was in bed when they received a phone call.  He denies any chest pain, shortness of breath.  Generally he has had health decline.  Fax reviewed.  Third-degree block appears to have lasted less than a minute. Home Medications Prior to Admission medications   Medication Sig Start Date End Date Taking? Authorizing Provider  aspirin EC 81 MG tablet Take 1 tablet (81 mg total) by mouth daily. Swallow whole. 06/21/22  Yes Belva Crome, MD  mirtazapine (REMERON) 15 MG tablet Take 15 mg by mouth at bedtime. 05/17/22  Yes [provider]  omeprazole (PRILOSEC) 20 MG capsule Take 20 mg by mouth 2 (two) times daily before a meal. 04/19/22  Yes [provider]  ELIQUIS 5 MG TABS tablet Take 5 mg by mouth 2 (two) times daily. Patient not taking: Reported on 07/03/2022 06/21/22   [provider]      Allergies    Patient has no known allergies.    Review of Systems   Review of Systems  Respiratory:  Negative for shortness of breath.   Cardiovascular:  Negative for chest pain, palpitations and leg swelling.  All other systems reviewed and are  negative.   Physical Exam Updated Vital Signs BP (!) 158/80   Pulse 62   Temp 98.1 F (36.7 C) (Oral)   Resp 18   Ht 1.727 m ('5\' 8"'$ )   Wt 71.2 kg   SpO2 100%   BMI 23.87 kg/m  Physical Exam Vitals and nursing note reviewed.  Constitutional:      Appearance: He is well-developed.     Comments: Elderly, chronically ill-appearing, thin  HENT:     Head: Normocephalic and atraumatic.  Eyes:     Pupils: Pupils are equal, round, and reactive to light.  Cardiovascular:     Rate and Rhythm: Normal rate and regular rhythm.     Heart sounds: Normal heart sounds. No murmur heard. Pulmonary:     Effort: Pulmonary effort is normal. No respiratory distress.     Breath sounds: Normal breath sounds. No wheezing.  Abdominal:     Palpations: Abdomen is soft.     Tenderness: There is no abdominal tenderness.  Musculoskeletal:     Cervical back: Neck supple.  Lymphadenopathy:     Cervical: No cervical adenopathy.  Skin:    General: Skin is warm and dry.  Neurological:     Mental Status: He is alert and oriented to person, place, and time.  Psychiatric:        Mood and Affect: Mood normal.     ED Results / Procedures /  Treatments   Labs (all labs ordered are listed, but only abnormal results are displayed) Labs Reviewed  CBC WITH DIFFERENTIAL/PLATELET  BASIC METABOLIC PANEL  MAGNESIUM    EKG EKG Interpretation  Date/Time:  Wednesday July 03 2022 03:14:58 EDT Ventricular Rate:  58 PR Interval:  232 QRS Duration: 99 QT Interval:  418 QTC Calculation: 411 R Axis:   62 Text Interpretation: Sinus rhythm Atrial premature complex Second deg AVB, Mobitz I (Wenckebach) ST elevation, consider inferior injury Confirmed by Thayer Jew [id number]) on 07/03/2022 3:36:45 AM  Radiology ECHOCARDIOGRAM COMPLETE  Result Date: 07/02/2022    ECHOCARDIOGRAM REPORT   Patient Name:   [name] Date of Exam: 07/02/2022 Medical Rec #:  [id number]        Height:       68.0 in Accession #:     [id number]       Weight:       153.6 lb Date of Birth:  04-20-1941         BSA:          1.827 m Patient Age:    89 years         BP:           110/62 mmHg Patient Gender: M                HR:           74 bpm. Exam Location:  [place] Procedure: 2D Echo, Cardiac Doppler and Color Doppler Indications:    I48.0 Paroxysmal atrial fibrillation  History:        Patient has prior history of Echocardiogram examinations, most                 recent 11/03/2019. Pre-op evaluation, Arrythmias:Paroxysmal                 atrial fibrillation; Risk Factors:Hypertension and Dyslipidemia.  Sonographer:    Coralyn Helling RDCS Referring Phys: [id number] [place]  1. Left ventricular ejection fraction, by estimation, is 60 to 65%. The left ventricle has normal function. The left ventricle has no regional wall motion abnormalities. Left ventricular diastolic parameters were normal.  2. Right ventricular systolic function is normal. The right ventricular size is normal.  3. Left atrial size was moderately dilated.  4. The mitral valve is abnormal. Trivial mitral valve regurgitation. No evidence of mitral stenosis.  5. The aortic valve is tricuspid. There is mild calcification of the aortic valve. There is mild thickening of the aortic valve. Aortic valve regurgitation is not visualized. Aortic valve sclerosis is present, with no evidence of aortic valve stenosis.  6. The inferior vena cava is normal in size with greater than 50% respiratory variability, suggesting right atrial pressure of 3 mmHg. FINDINGS  Left Ventricle: Left ventricular ejection fraction, by estimation, is 60 to 65%. The left ventricle has normal function. The left ventricle has no regional wall motion abnormalities. The left ventricular internal cavity size was normal in size. There is  no left ventricular hypertrophy. Left ventricular diastolic parameters were normal. Right Ventricle: The right ventricular size is normal. No increase in  right ventricular wall thickness. Right ventricular systolic function is normal. Left Atrium: Left atrial size was moderately dilated. Right Atrium: Right atrial size was normal in size. Pericardium: There is no evidence of pericardial effusion. Mitral Valve: The mitral valve is abnormal. There is mild thickening of the mitral valve leaflet(s). There is mild calcification of the mitral valve leaflet(s).  Mild mitral annular calcification. Trivial mitral valve regurgitation. No evidence of mitral valve stenosis. Tricuspid Valve: The tricuspid valve is normal in structure. Tricuspid valve regurgitation is not demonstrated. No evidence of tricuspid stenosis. Aortic Valve: The aortic valve is tricuspid. There is mild calcification of the aortic valve. There is mild thickening of the aortic valve. Aortic valve regurgitation is not visualized. Aortic regurgitation PHT measures 427 msec. Aortic valve sclerosis is present, with no evidence of aortic valve stenosis. Pulmonic Valve: The pulmonic valve was normal in structure. Pulmonic valve regurgitation is not visualized. No evidence of pulmonic stenosis. Aorta: The aortic root is normal in size and structure. Venous: The inferior vena cava is normal in size with greater than 50% respiratory variability, suggesting right atrial pressure of 3 mmHg. IAS/Shunts: The interatrial septum was not well visualized.  LEFT VENTRICLE PLAX 2D LVIDd:         3.60 cm   Diastology LVIDs:         2.40 cm   LV e' medial:    6.20 cm/s LV PW:         1.00 cm   LV E/e' medial:  14.9 LV IVS:        1.10 cm   LV e' lateral:   9.68 cm/s LVOT diam:     2.00 cm   LV E/e' lateral: 9.5 LV SV:         76 LV SV Index:   41 LVOT Area:     3.14 cm  RIGHT VENTRICLE             IVC RV S prime:     14.00 cm/s  IVC diam: 0.70 cm TAPSE (M-mode): 1.7 cm RVSP:           31.1 mmHg LEFT ATRIUM             Index        RIGHT ATRIUM           Index LA diam:        4.60 cm 2.52 cm/m   RA Pressure: 3.00 mmHg LA Vol  (A2C):   39.7 ml 21.70 ml/m  RA Area:     14.90 cm LA Vol (A4C):   42.8 ml 23.43 ml/m  RA Volume:   41.80 ml  22.88 ml/m LA Biplane Vol: 43.0 ml 23.54 ml/m  AORTIC VALVE LVOT Vmax:   120.00 cm/s LVOT Vmean:  77.100 cm/s LVOT VTI:    0.241 m AI PHT:      427 msec  AORTA Ao Root diam: 3.70 cm Ao Asc diam:  3.30 cm MITRAL VALVE                TRICUSPID VALVE MV Area (PHT): 2.71 cm     TR Peak grad:   28.1 mmHg MV Decel Time: 280 msec     TR Vmax:        265.00 cm/s MV E velocity: 92.20 cm/s   Estimated RAP:  3.00 mmHg MV A velocity: 114.00 cm/s  RVSP:           31.1 mmHg MV E/A ratio:  0.81                             SHUNTS                             Systemic VTI:  0.24  m                             Systemic Diam: 2.00 cm Jenkins Rouge MD Electronically signed by Jenkins Rouge MD Signature Date/Time: 07/02/2022/10:14:39 AM    Final     Procedures Procedures    Medications Ordered in ED Medications - No data to display  ED Course/ Medical Decision Making/ A&P Clinical Course as of 07/03/22 0355  Wed Jul 03, 2022  0336 Second-degree type I heart block noted on monitoring.  Pacer pads were placed as a precautionary measure.  No malignant heart block noted on initial cardiac monitoring. [CH]  2992 Spoke with cardiology fellow on-call.  Dr. Jannifer Franklin.  Recommends admission for cardiac monitoring.  Request admission to the hospitalist at [facility].  We will consult. [CH]    Clinical Course User Index [CH] Asyria Kolander, Barbette Hair, MD                           Medical Decision Making Amount and/or Complexity of Data Reviewed Labs: ordered.  Risk Decision regarding hospitalization.   This patient presents to the ED for concern of heart arrhythmia, this involves an extensive number of treatment options, and is a complaint that carries with it a high risk of complications and morbidity.  I considered the following differential and admission for this acute, potentially life threatening condition.  The  differential diagnosis includes heart block, metabolic disturbance, AV nodal disturbance  MDM:    This is an 81 year old male who presents with concern for arrhythmia caught on heart monitoring.  He is completely asymptomatic at this time.  Generalized health decline.  Vital signs are largely reassuring.  Initial EKG shows sinus rhythm.  On cardiac monitoring, he does have transient dip in heart rate into the 30s.  We have called some Mobitz type 1 but no malignant heart block.  Labs obtained and reviewed and are largely reassuring.  No significant metabolic derangements.  Patient pads are in place.  Did discuss with cardiology fellow as above.  We will plan for admission to the hospital.  Cardiology to consult.  (Labs, imaging, consults)  Labs: I Ordered, and personally interpreted labs.  The pertinent results include: CBC, BMP, magnesium normal  Imaging Studies ordered: I ordered imaging studies including none I independently visualized and interpreted imaging. I agree with the radiologist interpretation  Additional history obtained from bedside.  External records from outside source obtained and reviewed including cardiology office visits  Cardiac Monitoring: The patient was maintained on a cardiac monitor.  I personally viewed and interpreted the cardiac monitored which showed an underlying rhythm of: Sinus rhythm with intermittent Mobitz 1 block  Reevaluation: After the interventions noted above, I reevaluated the patient and found that they have :stayed the same  Social Determinants of Health: Elderly, lives independently  Disposition: Admit  Co morbidities that complicate the patient evaluation  Past Medical History:  Diagnosis Date   Arthritis    Attention deficit disorder    Blind left eye    Diabetes mellitus without complication ([place])    Hyperlipidemia    Hypertension      Medicines No orders of the defined types were placed in this encounter.   I have reviewed  the patients home medicines and have made adjustments as needed  Problem List / ED Course: Problem List Items Addressed This Visit   None Visit Diagnoses  Heart block    -  Primary   Relevant Medications   ELIQUIS 5 MG TABS tablet                   Final Clinical Impression(s) / ED Diagnoses Final diagnoses:  Heart block    Rx / DC Orders ED Discharge Orders     None         Pranathi Winfree, Barbette Hair, MD 07/03/22 [phone]

## 2022-07-03 NOTE — H&P (Signed)
History and Physical    Patient: [name] MRN: [id number] DOA: 07/03/2022  Date of Service: the patient was seen and examined on 07/03/2022  Patient coming from: Home  Chief Complaint:  Chief Complaint  Patient presents with   Heart Monitor Complication    HPI:   81 year old male with past medical history of diabetes mellitus type 2, paroxysmal atrial fibrillation (not on full dose anticoagulation due to multiple bleeding complications), hypertension, hyperlipidemia, left eye blindness, remote history of septic arthritis of the knees (2015) who presents to [facility] emergency department due to concerns for complete heart block.  Patient was recently evaluated as an outpatient by Dr. Tamala Julian with cardiology to determine if he was a Watchman device candidate.  Patient was felt to not be an optimal candidate for this procedure and the decision was made for the patient to be sent home with an event monitor for 30 days to evaluate his A-fib burden.  On 8/15, the patient was contacted by the heart monitor service due to complete heart block being detected.  Patient was instructed to go to the emergency department for evaluation.  Upon further questioning, the patient denies any associated symptoms of lightheadedness, chest pain, diaphoresis, shortness of breath over the past several days.  Overall however, the patient and spouse report the patient has become progressively more weak in the past several weeks to months.  Upon evaluation in the [facility] emergency department, patient was found to be in second-degree Mobitz type I block on telemetry.  EDP discussed case with the cardiology fellow Dr. Jannifer Franklin who agreed with hospitalization and requested the patient be admitted to [facility] with electrophysiology consultation planned.  The hospitalist group was then called to assess the patient for admission to the hospital.  Review of Systems: Review of Systems   Constitutional:  Positive for malaise/fatigue.  Neurological:  Positive for weakness.  All other systems reviewed and are negative.    Past Medical History:  Diagnosis Date   Arthritis    Attention deficit disorder    Blind left eye    Diabetes mellitus without complication ([place])    Hyperlipidemia    Hypertension     Past Surgical History:  Procedure Laterality Date   EYE SURGERY Left    x3   KNEE ARTHROSCOPY Bilateral 03/29/2014   Procedure: RIGHT KNEE ARTHROSCOPY AND DEBRIDEMENT, LEFT KNEE ASPIRATION,  LEFT KNEE ARTHROSCOPY AND DEBRIDEMENT.;  Surgeon: Meredith Pel, MD;  Location: [place];  Service: Orthopedics;  Laterality: Bilateral;  RIGHT KNEE ARTHROSCOPY AND DEBRIDEMENT, LEFT KNEE ASPIRATION,  LEFT KNEE ARTHROSCOPY AND DEBRIDEMENT.   KNEE ARTHROTOMY Bilateral 04/26/2014   Procedure: KNEE ARTHROTOMY, SYNOVECTOMY, ANTIBIOTIC BEAD PLACEMENT.;  Surgeon: Meredith Pel, MD;  Location: [place];  Service: Orthopedics;  Laterality: Bilateral;   lipoma removal, right upper back     TONSILLECTOMY     TOTAL HIP ARTHROPLASTY Left 09/14/2019   TOTAL HIP ARTHROPLASTY Left 09/14/2019   Procedure: LEFT TOTAL HIP ARTHROPLASTY ANTERIOR APPROACH;  Surgeon: Meredith Pel, MD;  Location: [place];  Service: Orthopedics;  Laterality: Left;    Social History:  reports that he has quit smoking. He has never used smokeless tobacco. He reports current alcohol use. He reports that he does not use drugs.  No Known Allergies  Family History  Problem Relation Age of Onset   Heart attack Father    Heart attack Sister    Heart attack Brother     Prior to Admission  medications   Medication Sig Start Date End Date Taking? Authorizing Provider  aspirin EC 81 MG tablet Take 1 tablet (81 mg total) by mouth daily. Swallow whole. 06/21/22  Yes Belva Crome, MD  mirtazapine (REMERON) 15 MG tablet Take 15 mg by mouth at bedtime. 05/17/22  Yes [provider]  omeprazole (PRILOSEC) 20 MG  capsule Take 20 mg by mouth 2 (two) times daily before a meal. 04/19/22  Yes [provider]  ELIQUIS 5 MG TABS tablet Take 5 mg by mouth 2 (two) times daily. Patient not taking: Reported on 07/03/2022 06/21/22   [provider]    Physical Exam:  Vitals:   07/03/22 0500 07/03/22 0515 07/03/22 0600 07/03/22 0700  BP: 119/66 126/67 126/68 138/78  Pulse: (!) 37 (!) 36 (!) 39 75  Resp: '19 17 18 '$ (!) 25  Temp: 98.2 F (36.8 C)     TempSrc:      SpO2: 100% 100% 100% 100%  Weight:      Height:        Constitutional: Awake alert and oriented x3, no associated distress.   Skin: no rashes, no lesions, good skin turgor noted. Eyes: Pupils are equally reactive to light.  No evidence of scleral icterus or conjunctival pallor.  ENMT: Moist mucous membranes noted.  Posterior pharynx clear of any exudate or lesions.   Neck: normal, supple, no masses, no thyromegaly.  No evidence of jugular venous distension.   Respiratory: clear to auscultation bilaterally, no wheezing, no crackles. Normal respiratory effort. No accessory muscle use.  Cardiovascular: Regular rate and rhythm, no murmurs / rubs / gallops. No extremity edema. 2+ pedal pulses. No carotid bruits.  Chest:   Nontender without crepitus or deformity.   Back:   Nontender without crepitus or deformity. Abdomen: Abdomen is soft and nontender.  No evidence of intra-abdominal masses.  Positive bowel sounds noted in all quadrants.   Musculoskeletal: No joint deformity upper and lower extremities. Good ROM, no contractures. Normal muscle tone.  Neurologic: CN 2-12 grossly intact. Sensation intact.  Patient moving all 4 extremities spontaneously.  Patient is following all commands.  Patient is responsive to verbal stimuli.   Psychiatric: Patient exhibits normal mood with appropriate affect.  Patient seems to possess insight as to their current situation.    Data Reviewed:  I have personally reviewed and interpreted labs,  imaging.  Significant findings are:  CBC revealing white blood cell count of 5.6, hemoglobin of 14.8, hematocrit of 45.7, platelet count of 257 Chemistry revealing sodium of 141, potassium 3.9, chloride 107, bicarbonate 27, BUN 23, creatinine 1.2 Magnesium 2.1  EKG: Personally reviewed.  Rhythm is junctional rhythm with heart rate of 33 bpm.  No dynamic ST segment changes appreciated.   Assessment and Plan: * Complete heart block [facility]) Patient experienced a stretch of what appeared to be complete heart block the evening of 8/15 on the event monitor Patient reports no associated symptoms although patient was sleeping at the time Review of telemetry in the emergency department reveals patient going inbetween sinus bradycardia, second-degree Mobitz 1 and Mobitz 2 block with patient remaining asymptomatic EDP discussed case with Dr. Jannifer Franklin the cardiology fellow overnight who recommended admitting the patient to [facility] for electrophysiology evaluation for consideration of pacemaker Patient is not currently on any AV nodal blocking agents Continue to monitor patient on telemetry N.p.o. for now until cardiology can provide input this morning Bed request placed for progressive bed at [facility]  Paroxysmal atrial  fibrillation [facility]) Patient has not been on full dose anticoagulation for at least 2 months due to bleeding complications such as severe epistaxis and has instead been using aspirin 81 mg daily Patient additionally is not on AV nodal blocking therapy and typically is rate controlled Continue to monitor patient on telemetry   Type 2 diabetes mellitus without complication, without long-term current use of insulin ([place]) Patient been placed on Accu-Cheks before every meal and nightly with sliding scale insulin Hemoglobin A1C ordered Diabetic Diet   GERD without esophagitis Continuing home regimen of daily PPI therapy.        Code Status:  Full code  code status  decision has been confirmed with: patient Family Communication: Was at the bedside and has been updated on plan of care  Consults: Cardiology  Severity of Illness:  The appropriate patient status for this patient is OBSERVATION. Observation status is judged to be reasonable and necessary in order to provide the required intensity of service to ensure the patient's safety. The patient's presenting symptoms, physical exam findings, and initial radiographic and laboratory data in the context of their medical condition is felt to place them at decreased risk for further clinical deterioration. Furthermore, it is anticipated that the patient will be medically stable for discharge from the hospital within 2 midnights of admission.   Author:  Vernelle Emerald MD  07/03/2022 7:59 AM

## 2022-07-03 NOTE — Telephone Encounter (Signed)
Transmission received 6:49 am CST.  Representative states she paged 8:48 am CST and received call back from answering service stating to call our office to speak w a nurse or doctor.  Auto detected - 1st documentation of 2nd degree type two - 34 bpm x 1 min, also has 2nd degree type one.  There is already a telephone encounter for this report. The patient was previously advised to go to the ER.

## 2022-07-04 DIAGNOSIS — R001 Bradycardia, unspecified: Secondary | ICD-10-CM | POA: Diagnosis present

## 2022-07-04 DIAGNOSIS — K219 Gastro-esophageal reflux disease without esophagitis: Secondary | ICD-10-CM | POA: Diagnosis present

## 2022-07-04 DIAGNOSIS — M19041 Primary osteoarthritis, right hand: Secondary | ICD-10-CM | POA: Diagnosis present

## 2022-07-04 DIAGNOSIS — Z96642 Presence of left artificial hip joint: Secondary | ICD-10-CM | POA: Diagnosis present

## 2022-07-04 DIAGNOSIS — H5462 Unqualified visual loss, left eye, normal vision right eye: Secondary | ICD-10-CM | POA: Diagnosis present

## 2022-07-04 DIAGNOSIS — I480 Paroxysmal atrial fibrillation: Secondary | ICD-10-CM | POA: Diagnosis present

## 2022-07-04 DIAGNOSIS — I10 Essential (primary) hypertension: Secondary | ICD-10-CM | POA: Diagnosis present

## 2022-07-04 DIAGNOSIS — B961 Klebsiella pneumoniae [K. pneumoniae] as the cause of diseases classified elsewhere: Secondary | ICD-10-CM | POA: Diagnosis present

## 2022-07-04 DIAGNOSIS — E876 Hypokalemia: Secondary | ICD-10-CM | POA: Diagnosis present

## 2022-07-04 DIAGNOSIS — M19042 Primary osteoarthritis, left hand: Secondary | ICD-10-CM | POA: Diagnosis present

## 2022-07-04 DIAGNOSIS — F988 Other specified behavioral and emotional disorders with onset usually occurring in childhood and adolescence: Secondary | ICD-10-CM | POA: Diagnosis present

## 2022-07-04 DIAGNOSIS — I459 Conduction disorder, unspecified: Principal | ICD-10-CM | POA: Diagnosis present

## 2022-07-04 DIAGNOSIS — Z8249 Family history of ischemic heart disease and other diseases of the circulatory system: Secondary | ICD-10-CM | POA: Diagnosis not present

## 2022-07-04 DIAGNOSIS — R6883 Chills (without fever): Secondary | ICD-10-CM | POA: Diagnosis not present

## 2022-07-04 DIAGNOSIS — Z7189 Other specified counseling: Secondary | ICD-10-CM | POA: Diagnosis not present

## 2022-07-04 DIAGNOSIS — R008 Other abnormalities of heart beat: Secondary | ICD-10-CM | POA: Diagnosis present

## 2022-07-04 DIAGNOSIS — Z87891 Personal history of nicotine dependence: Secondary | ICD-10-CM | POA: Diagnosis not present

## 2022-07-04 DIAGNOSIS — F0390 Unspecified dementia without behavioral disturbance: Secondary | ICD-10-CM | POA: Diagnosis present

## 2022-07-04 DIAGNOSIS — I4589 Other specified conduction disorders: Secondary | ICD-10-CM | POA: Diagnosis present

## 2022-07-04 DIAGNOSIS — E785 Hyperlipidemia, unspecified: Secondary | ICD-10-CM | POA: Diagnosis present

## 2022-07-04 DIAGNOSIS — E872 Acidosis, unspecified: Secondary | ICD-10-CM | POA: Diagnosis present

## 2022-07-04 DIAGNOSIS — I358 Other nonrheumatic aortic valve disorders: Secondary | ICD-10-CM | POA: Diagnosis present

## 2022-07-04 DIAGNOSIS — Z515 Encounter for palliative care: Secondary | ICD-10-CM | POA: Diagnosis not present

## 2022-07-04 DIAGNOSIS — N3000 Acute cystitis without hematuria: Secondary | ICD-10-CM | POA: Diagnosis present

## 2022-07-04 DIAGNOSIS — E119 Type 2 diabetes mellitus without complications: Secondary | ICD-10-CM | POA: Diagnosis present

## 2022-07-04 DIAGNOSIS — I442 Atrioventricular block, complete: Secondary | ICD-10-CM | POA: Diagnosis present

## 2022-07-04 DIAGNOSIS — R7989 Other specified abnormal findings of blood chemistry: Secondary | ICD-10-CM | POA: Diagnosis present

## 2022-07-04 LAB — URINALYSIS, ROUTINE W REFLEX MICROSCOPIC
Bilirubin Urine: NEGATIVE
Glucose, UA: NEGATIVE mg/dL
Ketones, ur: 20 mg/dL — AB
Nitrite: POSITIVE — AB
Protein, ur: 30 mg/dL — AB
RBC / HPF: >50 RBC/hpf — ABNORMAL HIGH (ref 0–5)
Specific Gravity, Urine: 1.018 (ref 1.005–1.030)
pH: 6 (ref 5.0–8.0)

## 2022-07-04 LAB — BASIC METABOLIC PANEL
Anion gap: 7 (ref 5–15)
BUN: 17 mg/dL (ref 8–23)
CO2: 25 mmol/L (ref 22–32)
Calcium: 9.1 mg/dL (ref 8.9–10.3)
Chloride: 107 mmol/L (ref 98–111)
Creatinine, Ser: 1.01 mg/dL (ref 0.61–1.24)
GFR, Estimated: >60 mL/min (ref >60)
Glucose, Bld: 92 mg/dL (ref 70–99)
Potassium: 3.7 mmol/L (ref 3.5–5.1)
Sodium: 139 mmol/L (ref 135–145)

## 2022-07-04 LAB — GLUCOSE, CAPILLARY
Glucose-Capillary: 94 mg/dL (ref 70–99)
Glucose-Capillary: 86 mg/dL (ref 70–99)
Glucose-Capillary: 81 mg/dL (ref 70–99)
Glucose-Capillary: 91 mg/dL (ref 70–99)

## 2022-07-04 LAB — MAGNESIUM: Magnesium: 2 mg/dL (ref 1.7–2.4)

## 2022-07-04 MED ORDER — LORAZEPAM 2 MG/ML IJ SOLN
0.5000 mg | Freq: Four times a day (QID) | INTRAMUSCULAR | Status: DC | PRN
  Administered 2022-07-04 – 2022-07-06 (×2): 0.5 mg via INTRAVENOUS
  Filled 2022-07-04 (×2): qty 1

## 2022-07-04 MED ORDER — LACTATED RINGERS IV SOLN
INTRAVENOUS | Status: AC
Start: 2022-07-04 — End: 2022-07-05

## 2022-07-04 NOTE — Progress Notes (Signed)
PROGRESS NOTE    [name]  QMV:[id number] DOB: 11-23-1940 DOA: 07/03/2022 PCP: Kathyrn Lass, MD     Brief Narrative:   81 year old male with past medical history of diabetes mellitus type 2 (appears diet controlled, not on meds at home), paroxysmal atrial fibrillation (not on full dose anticoagulation due to multiple bleeding complications),  left eye blindness,  who is sent to  [facility] emergency department by cardiology clinic due to concerns for complete heart block  Subjective:  He is demented, does not appear to be in acute distress, family at bedside  Assessment & Plan:  Principal Problem:   Complete heart block ([place]) Active Problems:   Paroxysmal atrial fibrillation ([place])   Type 2 diabetes mellitus without complication, without long-term current use of insulin ([place])   GERD without esophagitis   Heart block    Assessment and Plan:  * Complete heart block (HCC)/paroxysmal A-fib Patient experienced a stretch of what appeared to be complete heart block the evening of 8/15 on the event monitor, Patient reports no associated symptoms although patient was sleeping at the time Patient is not on any AV nodal blocking agents at home, tsh unremarkable  Management per cardiology and EP  Paroxysmal atrial fibrillation ([place]) Patient has not been on full dose anticoagulation for at least 2 months due to bleeding complications such as severe epistaxis and has instead been using aspirin 81 mg daily Patient additionally is not on AV nodal blocking therapy and typically is rate controlled Will follow cardiology recommendation   Type 2 diabetes mellitus without complication, without long-term current use of insulin ([place]) Patient been placed on Accu-Cheks before every meal and nightly with sliding scale insulin Hemoglobin A1C 5.6 Diabetic Diet   GERD without esophagitis Continuing home regimen of daily PPI therapy.  Dementia At risk of delirium/sundowning        I have Reviewed nursing notes, Vitals, pain scores, I/o's, Lab results and  imaging results since pt's last encounter, details please see discussion above  I ordered the following labs:  Unresulted Labs (From admission, onward)     Start     Ordered   07/05/22 0500  CBC with Differential/Platelet  Tomorrow morning,   R       Question:  Specimen collection method  Answer:  Lab=Lab collect   07/04/22 1720   07/05/22 2841  Basic metabolic panel  Tomorrow morning,   R       Question:  Specimen collection method  Answer:  Lab=Lab collect   07/04/22 1720   07/05/22 0500  Magnesium  Tomorrow morning,   R       Question:  Specimen collection method  Answer:  Lab=Lab collect   07/04/22 1720   07/04/22 1625  Culture, blood (Routine X 2) w Reflex to ID Panel  BLOOD CULTURE X 2,   R      07/04/22 1624   07/04/22 1625  Urinalysis, Routine w reflex microscopic Urine, Clean Catch  Once,   R        07/04/22 1624   07/04/22 1625  Urine Culture  (Urine Culture)  Once,   R       Question:  Indication  Answer:  Altered mental status (if no other cause identified)   07/04/22 1624             DVT prophylaxis: SCDs Start: 07/03/22 0655   Code Status:   Code Status: Full Code  Family Communication: Wife at bedside Disposition:  Dispo: The patient is from: Home              Anticipated d/c is to: TBD              Anticipated d/c date is: TBD, needs cardiology clearance  Antimicrobials:    Anti-infectives (From admission, onward)    None          Objective: Vitals:   07/04/22 0808 07/04/22 1052 07/04/22 1218 07/04/22 1609  BP: (!) 144/73 130/73 126/72 (!) 156/87  Pulse: (!) 55 75 72 80  Resp: '18 20 20 18  '$ Temp: 97.6 F (36.4 C) 97.7 F (36.5 C) 97.7 F (36.5 C) 97.9 F (36.6 C)  TempSrc: Oral Oral Oral Oral  SpO2: 98% 98% 99% 99%  Weight:      Height:       No intake or output data in the 24 hours ending 07/04/22 1720 Filed Weights   07/03/22 0133  Weight: 71.2 kg     Examination:  General exam: alert, awake, demented Respiratory system: Clear to auscultation. Respiratory effort normal. Cardiovascular system:  IRRR.  Gastrointestinal system: Abdomen is nondistended, soft and nontender.  Normal bowel sounds heard. Central nervous system: Alert, demented Extremities:  no edema Skin: No rashes, lesions or ulcers Psychiatry: Currently no agitation    Data Reviewed: I have personally reviewed  labs and visualized  imaging studies since the last encounter and formulate the plan        Scheduled Meds:  insulin aspart  0-9 Units Subcutaneous TID AC & HS   mirtazapine  15 mg Oral QHS   pantoprazole  40 mg Oral BID AC   QUEtiapine  25 mg Oral QHS   Continuous Infusions:  lactated ringers 75 mL/hr at 07/04/22 1255     LOS: 0 days     Florencia Reasons, MD PhD FACP [facility]  Available via Epic secure chat 7am-7pm for nonurgent issues Please page for urgent issues To page the attending provider between 7A-7P or the covering provider during after hours 7P-7A, please log into the web site [email or web address] and access using universal [place] password for that web site. If you do not have the password, please call the hospital operator.    07/04/2022, 5:20 PM

## 2022-07-04 NOTE — Progress Notes (Signed)
Cardiology paged waiting for response

## 2022-07-04 NOTE — Progress Notes (Signed)
Patient is bradycardic from sinus rhythm throughout the day. Patient is having non sustained drops into the twenties. Patient other vitals are WDL. Ptatient is alert but has dementia at baseline. Bridgett Larsson MD. Notified. Adviseed to contact cardiology.

## 2022-07-04 NOTE — Progress Notes (Addendum)
Progress Note  Patient Name: [name] Date of Encounter: 07/04/2022  [facility] Cardiologist: Belva Crome III, MD   Subjective   BP 144/73 this morning.  Cr 1.01.  Denies any chest pain, dyspnea, or lightheadedness.  Oriented x2.  Inpatient Medications    Scheduled Meds:  insulin aspart  0-9 Units Subcutaneous TID AC & HS   mirtazapine  15 mg Oral QHS   pantoprazole  40 mg Oral BID AC   QUEtiapine  25 mg Oral QHS   Continuous Infusions:  PRN Meds: acetaminophen **OR** acetaminophen, ondansetron **OR** ondansetron (ZOFRAN) IV, polyethylene glycol   Vital Signs    Vitals:   07/03/22 2030 07/04/22 0050 07/04/22 0446 07/04/22 0808  BP: 125/72 128/72 137/76 (!) 144/73  Pulse: 70 71 (!) 57 (!) 55  Resp: '19 18 17 18  '$ Temp: (!) 97.5 F (36.4 C)  (!) 97.5 F (36.4 C) 97.6 F (36.4 C)  TempSrc: Oral  Axillary Oral  SpO2:   96% 98%  Weight:      Height:       No intake or output data in the 24 hours ending 07/04/22 0917    07/03/2022    1:33 AM 06/21/2022    2:57 PM 06/13/2022   10:25 AM  Last 3 Weights  Weight (lbs) 157 lb 153 lb 9.6 oz 157 lb  Weight (kg) 71.215 kg 69.673 kg 71.215 kg      Telemetry    NSR in 60s, intermittent CHB with junctional escape rhythm down to 30s - Personally Reviewed  ECG    No new ECG - Personally Reviewed  Physical Exam   GEN: No acute distress.   Neck: No JVD Cardiac: bradycardic no murmurs, rubs, or gallops.  Respiratory: Clear to auscultation bilaterally. GI: Soft, nontender, non-distended  MS: No edema; No deformity. Neuro:  Oriented x2 Psych: Normal affect   Labs    High Sensitivity Troponin:   Recent Labs  Lab 07/03/22 0304 07/03/22 0939  TROPONINIHS 26* 23*     Chemistry Recent Labs  Lab 07/03/22 0304 07/04/22 0657  NA 141 139  K 3.9 3.7  CL 107 107  CO2 27 25  GLUCOSE 97 92  BUN 23 17  CREATININE 1.20 1.01  CALCIUM 9.1 9.1  MG 2.1 2.0  GFRNONAA >60 >60  ANIONGAP 7 7    Lipids No  results for input(s): "CHOL", "TRIG", "HDL", "LABVLDL", "LDLCALC", "CHOLHDL" in the last 168 hours.  Hematology Recent Labs  Lab 07/03/22 0304  WBC 5.6  RBC 4.93  HGB 14.8  HCT 45.7  MCV 92.7  MCH 30.0  MCHC 32.4  RDW 12.9  PLT 257   Thyroid  Recent Labs  Lab 07/03/22 0939  TSH 0.655    BNPNo results for input(s): "BNP", "PROBNP" in the last 168 hours.  DDimer No results for input(s): "DDIMER" in the last 168 hours.   Radiology    ECHOCARDIOGRAM COMPLETE  Result Date: 07/02/2022    ECHOCARDIOGRAM REPORT   Patient Name:   [name] Date of Exam: 07/02/2022 Medical Rec #:  [id number]        Height:       68.0 in Accession #:    [id number]       Weight:       153.6 lb Date of Birth:  05-Apr-1941         BSA:          1.827 m Patient Age:    81  years         BP:           110/62 mmHg Patient Gender: M                HR:           74 bpm. Exam Location:  [place] Procedure: 2D Echo, Cardiac Doppler and Color Doppler Indications:    I48.0 Paroxysmal atrial fibrillation  History:        Patient has prior history of Echocardiogram examinations, most                 recent 11/03/2019. Pre-op evaluation, Arrythmias:Paroxysmal                 atrial fibrillation; Risk Factors:Hypertension and Dyslipidemia.  Sonographer:    Coralyn Helling RDCS Referring Phys: [id number] [place]  1. Left ventricular ejection fraction, by estimation, is 60 to 65%. The left ventricle has normal function. The left ventricle has no regional wall motion abnormalities. Left ventricular diastolic parameters were normal.  2. Right ventricular systolic function is normal. The right ventricular size is normal.  3. Left atrial size was moderately dilated.  4. The mitral valve is abnormal. Trivial mitral valve regurgitation. No evidence of mitral stenosis.  5. The aortic valve is tricuspid. There is mild calcification of the aortic valve. There is mild thickening of the aortic valve. Aortic valve  regurgitation is not visualized. Aortic valve sclerosis is present, with no evidence of aortic valve stenosis.  6. The inferior vena cava is normal in size with greater than 50% respiratory variability, suggesting right atrial pressure of 3 mmHg. FINDINGS  Left Ventricle: Left ventricular ejection fraction, by estimation, is 60 to 65%. The left ventricle has normal function. The left ventricle has no regional wall motion abnormalities. The left ventricular internal cavity size was normal in size. There is  no left ventricular hypertrophy. Left ventricular diastolic parameters were normal. Right Ventricle: The right ventricular size is normal. No increase in right ventricular wall thickness. Right ventricular systolic function is normal. Left Atrium: Left atrial size was moderately dilated. Right Atrium: Right atrial size was normal in size. Pericardium: There is no evidence of pericardial effusion. Mitral Valve: The mitral valve is abnormal. There is mild thickening of the mitral valve leaflet(s). There is mild calcification of the mitral valve leaflet(s). Mild mitral annular calcification. Trivial mitral valve regurgitation. No evidence of mitral valve stenosis. Tricuspid Valve: The tricuspid valve is normal in structure. Tricuspid valve regurgitation is not demonstrated. No evidence of tricuspid stenosis. Aortic Valve: The aortic valve is tricuspid. There is mild calcification of the aortic valve. There is mild thickening of the aortic valve. Aortic valve regurgitation is not visualized. Aortic regurgitation PHT measures 427 msec. Aortic valve sclerosis is present, with no evidence of aortic valve stenosis. Pulmonic Valve: The pulmonic valve was normal in structure. Pulmonic valve regurgitation is not visualized. No evidence of pulmonic stenosis. Aorta: The aortic root is normal in size and structure. Venous: The inferior vena cava is normal in size with greater than 50% respiratory variability, suggesting right  atrial pressure of 3 mmHg. IAS/Shunts: The interatrial septum was not well visualized.  LEFT VENTRICLE PLAX 2D LVIDd:         3.60 cm   Diastology LVIDs:         2.40 cm   LV e' medial:    6.20 cm/s LV PW:  1.00 cm   LV E/e' medial:  14.9 LV IVS:        1.10 cm   LV e' lateral:   9.68 cm/s LVOT diam:     2.00 cm   LV E/e' lateral: 9.5 LV SV:         76 LV SV Index:   41 LVOT Area:     3.14 cm  RIGHT VENTRICLE             IVC RV S prime:     14.00 cm/s  IVC diam: 0.70 cm TAPSE (M-mode): 1.7 cm RVSP:           31.1 mmHg LEFT ATRIUM             Index        RIGHT ATRIUM           Index LA diam:        4.60 cm 2.52 cm/m   RA Pressure: 3.00 mmHg LA Vol (A2C):   39.7 ml 21.70 ml/m  RA Area:     14.90 cm LA Vol (A4C):   42.8 ml 23.43 ml/m  RA Volume:   41.80 ml  22.88 ml/m LA Biplane Vol: 43.0 ml 23.54 ml/m  AORTIC VALVE LVOT Vmax:   120.00 cm/s LVOT Vmean:  77.100 cm/s LVOT VTI:    0.241 m AI PHT:      427 msec  AORTA Ao Root diam: 3.70 cm Ao Asc diam:  3.30 cm MITRAL VALVE                TRICUSPID VALVE MV Area (PHT): 2.71 cm     TR Peak grad:   28.1 mmHg MV Decel Time: 280 msec     TR Vmax:        265.00 cm/s MV E velocity: 92.20 cm/s   Estimated RAP:  3.00 mmHg MV A velocity: 114.00 cm/s  RVSP:           31.1 mmHg MV E/A ratio:  0.81                             SHUNTS                             Systemic VTI:  0.24 m                             Systemic Diam: 2.00 cm Jenkins Rouge MD Electronically signed by Jenkins Rouge MD Signature Date/Time: 07/02/2022/10:14:39 AM    Final     Cardiac Studies     Patient Profile     81 y.o. male with a history of paroxysmal atrial fibrillation not on anticoagulation due to frequent nosebleeds, hypertension, hyperlipidemia, type 2 diabetes, ADD, and dementia who is being seen 07/03/2022 for the evaluation of complete heart block   Assessment & Plan    Complete Heart Block  Patient had outpatient 30-day event monitor placed  to assess atrial fibrillation  burden and if reportedly showed complete heart block and second-degree AV block Mobitz type II overnight..  Patient was advised to come to the ED for further evaluation. Echo 8/15 showed normal LV function.  Potassium and magnesium within normal limits.  He is not on any AV nodal agents at home. - Telemetry shows intermittent complete heart block - Continue to avoid AV  nodal agents.  - EP consulted for PPM evaluation   Paroxysmal Atrial Fibrillation Patient has a history of paroxysmal atrial fibrillation but has been unable to tolerate anticoagulation due to frequent nosebleeds.  He was recently seen by Dr. Quentin Ore and there were initial plans for Watchman procedure but then wife decided against this.  30-day event monitor was placed to assess atrial fibrillation burden and help guide anticoagulation therapy. - Maintaining sinus rhythm at this time. - No AV nodal agents due to intermittent bradycardia as above. - Continue Aspirin 81 mg daily for now.  Holding anticoagulation.   Elevated Troponin   troponin 26 >23. EKG shows no acute ischemic changes compared to prior tracings. Echo yesterday showed normal LV function and normal wall motion. - Patient describes some very atypical chest pain that only lasts for 1-2 seconds at a time at rest and then resolves. No exertional chest pain. This does not sounds like cardiac chest pain.  Troponins flat, not consistent with ACS.  No ischemic evaluation recommended   Hypertension BP mostly well controlled. - Not on any antihypertensives. - Continue to monitor.   Hyperlipidemia History of hyperlipidemia listed in his chart but does not appear to be on any medical therapy for this.   Type 2 Diabetes Mellitus - Management per primary team.  For questions or updates, please contact [place] Please consult [email or web address] for contact info under        Signed, Donato Heinz, MD  07/04/2022, 9:17 AM

## 2022-07-04 NOTE — Progress Notes (Addendum)
Straight intermittent catheter was unsuccessful. Resistance was met with the presence of frank blood in tube. No urine output obtain. No bleeding after removal of the catheter tube. Patient is now resting with his eyes closed. Patient would benefit from Urology consult. Bridgett Larsson MD notified

## 2022-07-04 NOTE — Consult Note (Signed)
Cardiology Consultation:   Patient ID: [name] MRN: [id number]; DOB: 1941/05/12  Admit date: 07/03/2022 Date of Consult: 07/04/2022  PCP:  Kathyrn Lass, MD   [facility] Providers Cardiologist:  Sinclair Grooms, MD  Electrophysiologist:  Vickie Epley, MD  {  Patient Profile:   [name] is a 81 y.o. male with a hx of DM, HTN, ADHD, ?some degree of dementia, AFib, recurrent nose bleed s, L eye blindness who is being seen 07/04/2022 for the evaluation of variable heart block at the request of Dr. Gardiner Rhyme.  History of Present Illness:   Mr. Fritze was referred to Dr. Quentin Ore in July 2023 for consideration of watchman device with patient reports of recurrent nose bleeds despite at tat time he had self reduced his dosing to daily nand perhaps stopped by his PMD for ASA therapy because of the bleeding. Seems was planned to proceed though after further consideration by the patient/wife, they decide not to pursue the procedure with his overall functional decline.  He saw Dr. Tamala Julian in August, was wearing a monitor pending completion to assess AFib burden and decisions about his ASA vs Eliquis.  Monitor observed heart block and the patient was advised to go to the ER via monitoring company recs. Tracings reviewed by DOD described as Mobitz one with 2:1  He was admitted to [facility] and cardiology consulted for further eval and management.  With observed advanced heart block transferred to [facility] for EP evaluation and ongoing management. Reports tracings c/w CHB  LABS K+ 3.9, 3.7 Mag 2.1, 2.0 BUN/Creat 17/1.01 HS Trop 26, 23 WBC 5.6 H/H 14/45 Plts 257 Trop 0.655  TTE w/LVEF 60-65%, no WMA, no significant VHD  On no nodal blocking agents home or here Both ASA and Eliquis are listed as home meds   Patient is AAOx1 only, tells me his name/Bday correctly only Tells me he is in [place] though when school gets out will go to [place]. No family at [facility] [place] but  asks me to come back with a doctor  Wife arrived She reports no changes at home, confirms baseline dementia No reports of CP, palpitations No falls, no faints He c/o headaches now and then and with long walking gets tire, neither of these are new or escalating  She also clarifies he is taking ASA NOT Eliquis currently  Past Medical History:  Diagnosis Date   Arthritis    Attention deficit disorder    Blind left eye    Diabetes mellitus without complication ([place])    Hyperlipidemia    Hypertension     Past Surgical History:  Procedure Laterality Date   EYE SURGERY Left    x3   KNEE ARTHROSCOPY Bilateral 03/29/2014   Procedure: RIGHT KNEE ARTHROSCOPY AND DEBRIDEMENT, LEFT KNEE ASPIRATION,  LEFT KNEE ARTHROSCOPY AND DEBRIDEMENT.;  Surgeon: Meredith Pel, MD;  Location: [place];  Service: Orthopedics;  Laterality: Bilateral;  RIGHT KNEE ARTHROSCOPY AND DEBRIDEMENT, LEFT KNEE ASPIRATION,  LEFT KNEE ARTHROSCOPY AND DEBRIDEMENT.   KNEE ARTHROTOMY Bilateral 04/26/2014   Procedure: KNEE ARTHROTOMY, SYNOVECTOMY, ANTIBIOTIC BEAD PLACEMENT.;  Surgeon: Meredith Pel, MD;  Location: [place];  Service: Orthopedics;  Laterality: Bilateral;   lipoma removal, right upper back     TONSILLECTOMY     TOTAL HIP ARTHROPLASTY Left 09/14/2019   TOTAL HIP ARTHROPLASTY Left 09/14/2019   Procedure: LEFT TOTAL HIP ARTHROPLASTY ANTERIOR APPROACH;  Surgeon: Meredith Pel, MD;  Location: [place];  Service: Orthopedics;  Laterality: Left;  Home Medications:  Prior to Admission medications   Medication Sig Start Date End Date Taking? Authorizing Provider  aspirin EC 81 MG tablet Take 1 tablet (81 mg total) by mouth daily. Swallow whole. 06/21/22  Yes Belva Crome, MD  mirtazapine (REMERON) 15 MG tablet Take 15 mg by mouth at bedtime. 05/17/22  Yes [provider]  omeprazole (PRILOSEC) 20 MG capsule Take 20 mg by mouth 2 (two) times daily before a meal. 04/19/22  Yes [provider]   ELIQUIS 5 MG TABS tablet Take 5 mg by mouth 2 (two) times daily. Patient not taking: Reported on 07/03/2022 06/21/22   [provider]    Inpatient Medications: Scheduled Meds:  insulin aspart  0-9 Units Subcutaneous TID AC & HS   mirtazapine  15 mg Oral QHS   pantoprazole  40 mg Oral BID AC   QUEtiapine  25 mg Oral QHS   Continuous Infusions:  PRN Meds: acetaminophen **OR** acetaminophen, ondansetron **OR** ondansetron (ZOFRAN) IV, polyethylene glycol  Allergies:   No Known Allergies  Social History:   Social History   Socioeconomic History   Marital status: Married    Spouse name: Not on file   Number of children: Not on file   Years of education: Not on file   Highest education level: Not on file  Occupational History   Not on file  Tobacco Use   Smoking status: Former   Smokeless tobacco: Never  Vaping Use   Vaping Use: Never used  Substance and Sexual Activity   Alcohol use: Yes    Comment: occasional    Drug use: No   Sexual activity: Not on file  Other Topics Concern   Not on file  Social History Narrative   Not on file   Social Determinants of Health   Financial Resource Strain: Not on file  Food Insecurity: Not on file  Transportation Needs: Not on file  Physical Activity: Not on file  Stress: Not on file  Social Connections: Not on file  Intimate Partner Violence: Not on file    Family History:   Family History  Problem Relation Age of Onset   Heart attack Father    Heart attack Sister    Heart attack Brother      ROS:  Please see the history of present illness.  All other ROS reviewed and negative.     Physical Exam/Data:   Vitals:   07/03/22 2030 07/04/22 0050 07/04/22 0446 07/04/22 0808  BP: 125/72 128/72 137/76 (!) 144/73  Pulse: 70 71 (!) 57 (!) 55  Resp: '19 18 17 18  '$ Temp: (!) 97.5 F (36.4 C)  (!) 97.5 F (36.4 C) 97.6 F (36.4 C)  TempSrc: Oral  Axillary Oral  SpO2:   96% 98%  Weight:      Height:       No  intake or output data in the 24 hours ending 07/04/22 1011    07/03/2022    1:33 AM 06/21/2022    2:57 PM 06/13/2022   10:25 AM  Last 3 Weights  Weight (lbs) 157 lb 153 lb 9.6 oz 157 lb  Weight (kg) 71.215 kg 69.673 kg 71.215 kg     Body mass index is 23.87 kg/m.  General:  Well nourished, well developed, in no acute distress HEENT: normal, L eye blind Neck: no JVD Vascular: No carotid bruits Cardiac:  RRR; no murmurs, gallops or rubs Lungs:  CTA b/l, no wheezing, rhonchi or rales  Abd: soft,  nontender  Ext: no edema Musculoskeletal:  No deformities Skin: warm and dry  Neuro:  no focal abnormalities noted Psych:  Normal affect   EKG:  The EKG was personally reviewed and demonstrates:    SR 69bpm, PR 241m SR 62bpm, PR 2380mJunctional rhythm 33bpm, narrow QRS with similar morphology as baseline, underlying A rhythm is in the 60's, though irregular, is dissociated from junctional rhythm, ?CHB  Telemetry:  Telemetry was personally reviewed and demonstrates:   SR 60's-70's, episode infrequently of blocked PACs, brief heart block with Mobitz one leading into 2:1, other times appears Mobitz II, infrequently has AR rate 120's with CHB resulting in brady rate to 30, though very transiently   Relevant CV Studies:  07/02/22: TTE  1. Left ventricular ejection fraction, by estimation, is 60 to 65%. The  left ventricle has normal function. The left ventricle has no regional  wall motion abnormalities. Left ventricular diastolic parameters were  normal.   2. Right ventricular systolic function is normal. The right ventricular  size is normal.   3. Left atrial size was moderately dilated.   4. The mitral valve is abnormal. Trivial mitral valve regurgitation. No  evidence of mitral stenosis.   5. The aortic valve is tricuspid. There is mild calcification of the  aortic valve. There is mild thickening of the aortic valve. Aortic valve  regurgitation is not visualized. Aortic valve  sclerosis is present, with  no evidence of aortic valve stenosis.   6. The inferior vena cava is normal in size with greater than 50%  respiratory variability, suggesting right atrial pressure of 3 mmHg.   Laboratory Data:  High Sensitivity Troponin:   Recent Labs  Lab 07/03/22 0304 07/03/22 0939  TROPONINIHS 26* 23*     Chemistry Recent Labs  Lab 07/03/22 0304 07/04/22 0657  NA 141 139  K 3.9 3.7  CL 107 107  CO2 27 25  GLUCOSE 97 92  BUN 23 17  CREATININE 1.20 1.01  CALCIUM 9.1 9.1  MG 2.1 2.0  GFRNONAA >60 >60  ANIONGAP 7 7    No results for input(s): "PROT", "ALBUMIN", "AST", "ALT", "ALKPHOS", "BILITOT" in the last 168 hours. Lipids No results for input(s): "CHOL", "TRIG", "HDL", "LABVLDL", "LDLCALC", "CHOLHDL" in the last 168 hours.  Hematology Recent Labs  Lab 07/03/22 0304  WBC 5.6  RBC 4.93  HGB 14.8  HCT 45.7  MCV 92.7  MCH 30.0  MCHC 32.4  RDW 12.9  PLT 257   Thyroid  Recent Labs  Lab 07/03/22 0939  TSH 0.655    BNPNo results for input(s): "BNP", "PROBNP" in the last 168 hours.  DDimer No results for input(s): "DDIMER" in the last 168 hours.   Radiology/Studies:    Assessment and Plan:   Variable heart block He has evidence of advanced heart block with some CHB on his monitor here. Narrow QRS  junctional rhythm in the 30's has been seen No reversible causes No symptoms  I suspect he will need pacer though in light of no symptoms and an incidental finding, will await Dr. LaMardene Speakvaluation and recommendations I will make him NPO for now   Risk Assessment/Risk Scores:    For questions or updates, please contact [facility] Please consult [email or web address] for contact info under    Signed, ReBaldwin JamaicaPA-C  07/04/2022 10:11 AM

## 2022-07-05 DIAGNOSIS — Z515 Encounter for palliative care: Secondary | ICD-10-CM

## 2022-07-05 DIAGNOSIS — I459 Conduction disorder, unspecified: Secondary | ICD-10-CM | POA: Diagnosis not present

## 2022-07-05 DIAGNOSIS — I442 Atrioventricular block, complete: Secondary | ICD-10-CM | POA: Diagnosis not present

## 2022-07-05 DIAGNOSIS — Z7189 Other specified counseling: Secondary | ICD-10-CM

## 2022-07-05 LAB — CBC WITH DIFFERENTIAL/PLATELET
Abs Immature Granulocytes: 0.15 10*3/uL — ABNORMAL HIGH (ref 0.00–0.07)
Basophils Absolute: 0 10*3/uL (ref 0.0–0.1)
Basophils Relative: 0 %
Eosinophils Absolute: 0 10*3/uL (ref 0.0–0.5)
Eosinophils Relative: 0 %
HCT: 44.1 % (ref 39.0–52.0)
Hemoglobin: 14.9 g/dL (ref 13.0–17.0)
Immature Granulocytes: 1 %
Lymphocytes Relative: 3 %
Lymphs Abs: 0.6 10*3/uL — ABNORMAL LOW (ref 0.7–4.0)
MCH: 30.4 pg (ref 26.0–34.0)
MCHC: 33.8 g/dL (ref 30.0–36.0)
MCV: 90 fL (ref 80.0–100.0)
Monocytes Absolute: 1 10*3/uL (ref 0.1–1.0)
Monocytes Relative: 5 %
Neutro Abs: 19.6 10*3/uL — ABNORMAL HIGH (ref 1.7–7.7)
Neutrophils Relative %: 91 %
Platelets: 228 10*3/uL (ref 150–400)
RBC: 4.9 MIL/uL (ref 4.22–5.81)
RDW: 12.7 % (ref 11.5–15.5)
WBC: 21.4 10*3/uL — ABNORMAL HIGH (ref 4.0–10.5)
nRBC: 0 % (ref 0.0–0.2)

## 2022-07-05 LAB — BASIC METABOLIC PANEL
Anion gap: 13 (ref 5–15)
BUN: 23 mg/dL (ref 8–23)
CO2: 21 mmol/L — ABNORMAL LOW (ref 22–32)
Calcium: 8.8 mg/dL — ABNORMAL LOW (ref 8.9–10.3)
Chloride: 104 mmol/L (ref 98–111)
Creatinine, Ser: 1.3 mg/dL — ABNORMAL HIGH (ref 0.61–1.24)
GFR, Estimated: 56 mL/min — ABNORMAL LOW (ref >60)
Glucose, Bld: 106 mg/dL — ABNORMAL HIGH (ref 70–99)
Potassium: 3.6 mmol/L (ref 3.5–5.1)
Sodium: 138 mmol/L (ref 135–145)

## 2022-07-05 LAB — MAGNESIUM: Magnesium: 1.9 mg/dL (ref 1.7–2.4)

## 2022-07-05 LAB — GLUCOSE, CAPILLARY
Glucose-Capillary: 118 mg/dL — ABNORMAL HIGH (ref 70–99)
Glucose-Capillary: 104 mg/dL — ABNORMAL HIGH (ref 70–99)
Glucose-Capillary: 176 mg/dL — ABNORMAL HIGH (ref 70–99)
Glucose-Capillary: 138 mg/dL — ABNORMAL HIGH (ref 70–99)

## 2022-07-05 MED ORDER — SODIUM CHLORIDE 0.9 % IV SOLN
3.0000 g | Freq: Three times a day (TID) | INTRAVENOUS | Status: DC
  Administered 2022-07-05 – 2022-07-06 (×4): 3 g via INTRAVENOUS
  Filled 2022-07-05 (×4): qty 8

## 2022-07-05 NOTE — Consult Note (Signed)
Palliative Medicine Inpatient Consult Note  Consulting Provider: Florencia Reasons, MD  Reason for consult:   [place] Palliative Medicine Consult  Reason for Consult? goals of care discussion, please see Dr Mardene Speak note from 8/17, thx   07/05/2022  HPI:  Per intake H&P --> 81 year old male with past medical history of diabetes mellitus type 2 (appears diet controlled, not on meds at home), paroxysmal atrial fibrillation (not on full dose anticoagulation due to multiple bleeding complications),  left eye blindness,  who is sent to  [facility] emergency department by cardiology clinic due to concerns for complete heart block. Palliative care has been asked to get involved in the setting of an e/o complete heart block for further goals of care conversations.   Clinical Assessment/Goals of Care:  *Please note that this is a verbal dictation therefore any spelling or grammatical errors are due to the "[employer] One" system interpretation.  I have reviewed medical records including EPIC notes, labs and imaging, received report from bedside RN, assessed the patient who is sitting in the chair sharing he wants to get up.    I met with patients spouse, Hoyle Sauer to further discuss diagnosis prognosis, GOC, EOL wishes, disposition and options.   I introduced Palliative Medicine as specialized medical care for people living with serious illness. It focuses on providing relief from the symptoms and stress of a serious illness. The goal is to improve quality of life for both the patient and the family.  Medical History Review and Understanding:  Hoyle Sauer and I reviewed [place] h/o diabetes mellitus type 2, paroxysmal atrial fibrillation,  & left eye blindness.  Social History:  Fady is from [place]. He has been married to his wife, Hoyle Sauer for the past 64 years. The shares three children together though one passed away within the last two years. They have five  grandchildren, two great grandchildren, and one great great grandchild. He loved fishing and traveling. He is a Arboriculturist.  Functional and Nutritional State:  Patient is able to perform all bADL's per his wife. He is eating well at home. He does not use any mobility aids.   Advance Directives:  Patient never completed any advanced directives per discussion with his wife.  Code Status:  A detailed conversation was held in the setting of patient's cardiopulmonary resuscitation status.  I shared my concerns in the setting of patient's underlying atrial fibrillation, lack of coagulation, and recent complete heart block.  I reviewed my worry that he could possibly go into cardiac arrest at any point.  I shared that I would strongly recommend her consideration of a DO NOT RESUSCITATE order as I would be concerned if we were to place dog with his body through the trauma of cardiopulmonary resuscitation that his quality of life would be far worse after than it was prior.  She shares that she will think about this and discuss it with her 2 children.  Provided  "Hard Choices for [employer]" booklet.   Discussion:  Hoyle Sauer shares that her husband has suffered from dementia for 3 years now.  He had begun to be more forgetful which was noticed by herself and her family members.  She shares that his sister also had dementia and what she feels precipitated Askia's mental decline was the death of his 2 sisters within a 2-year.  We reviewed how stress and grief can in fact worsen cognitive impairment.   Discussed patient is now being treatment for  possible UTI.  Discussed patient's complicated cardiac history Rip has an established relationship with Dr. Tamala Julian.  Reviewed that prior patient had been considered for Watchman device -which was not pursued.  Reviewed that at this point in time for electrophysiology Saw is not a candidate for pacemaker.  We reviewed the present  clinical conditions which [place] is contending with.  Reviewed the differences between outpatient palliative support and hospice support.  Discussed the importance of continued conversation with family and their  medical providers regarding overall plan of care and treatment options, ensuring decisions are within the context of the patients values and GOCs.  Decision Maker: Joaquin Bend (Spouse): [phone] (Mobile)  SUMMARY OF RECOMMENDATIONS   Full Code - patients wife would like to speak to their two children about this we did discuss DNAR/DNI  Continue with current scope of care  Patients wife agreeable to OP Palliative support for ongoing [place] conversations  Palliative care will continue following along  Code Status/Advance Care Planning: FULL CODE  Palliative Prophylaxis:  Aspiration, Bowel Regimen, Delirium Protocol, Frequent Pain Assessment, Oral Care, Palliative Wound Care, and Turn Reposition  Additional Recommendations (Limitations, Scope, Preferences): Continue current scope of care  Psycho-social/Spiritual:  Desire for further Chaplaincy support: Not presently Additional Recommendations: Education on    Prognosis: Unclear though more worrisome in the setting or arrhythmias - lack of ability to anticoagulate and place a PM  Discharge Planning: Discharge to home once medically optimized.  Vitals:   07/05/22 0435 07/05/22 0739  BP: 118/72 137/75  Pulse: 72 (!) 101  Resp: (!) 23 20  Temp: 98.5 F (36.9 C) (!) 97.3 F (36.3 C)  SpO2: 99% 99%    Intake/Output Summary (Last 24 hours) at 07/05/2022 1619 Last data filed at 07/04/2022 1825 Gross per 24 hour  Intake --  Output 400 ml  Net -400 ml   Last Weight  Most recent update: 07/03/2022  1:33 AM    Weight  71.2 kg (157 lb)            Gen: Elderly AA M in NAD HEENT: moist mucous membranes CV: Regular rate and rhythm  PULM: On RA, breathing is even and nonlabored ABD: soft/nontender EXT: No  edema Neuro: Alert and oriented to self  PPS: 60%   This conversation/these recommendations were discussed with patient primary care team, Dr. Erlinda Hong  Billing based on MDM: High  Problems Addressed: One acute or chronic illness or injury that poses a threat to life or bodily function  Amount and/or Complexity of Data: Category 3:Discussion of management or test interpretation with external physician/other qualified health care professional/appropriate source (not separately reported)  Risks: Decision regarding hospitalization or escalation of hospital care and Decision not to resuscitate or to de-escalate care because of poor prognosis ______________________________________________________ [place] Team Team Cell Phone: [phone] Please utilize secure chat with additional questions, if there is no response within 30 minutes please call the above phone number  Palliative Medicine Team providers are available by phone from 7am to 7pm daily and can be reached through the team cell phone.  Should this patient require assistance outside of these hours, please call the patient's attending physician.

## 2022-07-05 NOTE — Progress Notes (Signed)
Progress Note  Patient Name: [name] Date of Encounter: 07/05/2022  [facility] Cardiologist: Sinclair Grooms, MD   Subjective   Sleeping, wakes easily, tells me his name and is in the hospital.  Does not offer any more communication, follows commands appropriately  Inpatient Medications    Scheduled Meds:  insulin aspart  0-9 Units Subcutaneous TID AC & HS   mirtazapine  15 mg Oral QHS   pantoprazole  40 mg Oral BID AC   QUEtiapine  25 mg Oral QHS   Continuous Infusions:  ampicillin-sulbactam (UNASYN) IV     lactated ringers 75 mL/hr at 07/05/22 0239   PRN Meds: acetaminophen **OR** acetaminophen, LORazepam, ondansetron **OR** ondansetron (ZOFRAN) IV, polyethylene glycol   Vital Signs    Vitals:   07/04/22 1936 07/05/22 0019 07/05/22 0435 07/05/22 0739  BP: 115/73 117/70 118/72 137/75  Pulse: 70 70 72 (!) 101  Resp: (!) 24 (!) 23 (!) 23 20  Temp: 97.9 F (36.6 C) 99.6 F (37.6 C) 98.5 F (36.9 C) (!) 97.3 F (36.3 C)  TempSrc: Oral Axillary Axillary Oral  SpO2: 99% 99% 99% 99%  Weight:      Height:        Intake/Output Summary (Last 24 hours) at 07/05/2022 0910 Last data filed at 07/04/2022 1825 Gross per 24 hour  Intake --  Output 400 ml  Net -400 ml      07/03/2022    1:33 AM 06/21/2022    2:57 PM 06/13/2022   10:25 AM  Last 3 Weights  Weight (lbs) 157 lb 153 lb 9.6 oz 157 lb  Weight (kg) 71.215 kg 69.673 kg 71.215 kg      Telemetry    SR 60's-70's, Mobitz one with 2:1, more so evening, rates 40's - Personally Reviewed  ECG    No new EKGs - Personally Reviewed  Physical Exam   GEN: No acute distress.   Neck: No JVD Cardiac: RRR, no murmurs, rubs, or gallops.  Respiratory: CTA b/l. GI: Soft, nontender, non-distended  MS: No edema; No deformity. Neuro:  no gross focal motor deficits, known dementia   Psych: cooperative  Labs    High Sensitivity Troponin:   Recent Labs  Lab 07/03/22 0304 07/03/22 0939  TROPONINIHS 26*  23*     Chemistry Recent Labs  Lab 07/03/22 0304 07/04/22 0657 07/05/22 0354  NA 141 139 138  K 3.9 3.7 3.6  CL 107 107 104  CO2 27 25 21*  GLUCOSE 97 92 106*  BUN '23 17 23  '$ CREATININE 1.20 1.01 1.30*  CALCIUM 9.1 9.1 8.8*  MG 2.1 2.0 1.9  GFRNONAA >60 >60 56*  ANIONGAP '7 7 13    '$ Lipids No results for input(s): "CHOL", "TRIG", "HDL", "LABVLDL", "LDLCALC", "CHOLHDL" in the last 168 hours.  Hematology Recent Labs  Lab 07/03/22 0304 07/05/22 0354  WBC 5.6 21.4*  RBC 4.93 4.90  HGB 14.8 14.9  HCT 45.7 44.1  MCV 92.7 90.0  MCH 30.0 30.4  MCHC 32.4 33.8  RDW 12.9 12.7  PLT 257 228   Thyroid  Recent Labs  Lab 07/03/22 0939  TSH 0.655    BNPNo results for input(s): "BNP", "PROBNP" in the last 168 hours.  DDimer No results for input(s): "DDIMER" in the last 168 hours.   Radiology    No results found.  Cardiac Studies   07/02/22: TTE  1. Left ventricular ejection fraction, by estimation, is 60 to 65%. The  left ventricle has  normal function. The left ventricle has no regional  wall motion abnormalities. Left ventricular diastolic parameters were  normal.   2. Right ventricular systolic function is normal. The right ventricular  size is normal.   3. Left atrial size was moderately dilated.   4. The mitral valve is abnormal. Trivial mitral valve regurgitation. No  evidence of mitral stenosis.   5. The aortic valve is tricuspid. There is mild calcification of the  aortic valve. There is mild thickening of the aortic valve. Aortic valve  regurgitation is not visualized. Aortic valve sclerosis is present, with  no evidence of aortic valve stenosis.   6. The inferior vena cava is normal in size with greater than 50%  respiratory variability, suggesting right atrial pressure of 3 mmHg.   Patient Profile     81 y.o. male with a hx of DM, HTN, ADHD, dementia, AFib, recurrent nose bleeds, L eye blindness admitted via recommendations via monitoring service for noted  advanced heart block.  Assessment & Plan    Variable heart block He has evidence of advanced heart block with some CHB on his monitor here. Narrow QRS  junctional rhythm in the 30's has been seen No reversible causes No symptoms  Telemetry largely Mobitz one with some 2:1 conduction in the last 24hours. 2:1 rates 40's, otherwise 60's-70's  AAO x2, appears more calm today, following commands, and denies complaints  2. Leukocytosis Abnormal UA  For questions or updates, please contact [place] Please consult [email or web address] for contact info under        Signed, Baldwin Jamaica, PA-C  07/05/2022, 9:10 AM

## 2022-07-05 NOTE — Progress Notes (Signed)
Pharmacy Antibiotic Note  [name] is a 81 y.o. male with UTI  (history of enterococcus). Pharmacy has been consulted for Unasyn dosing. -WBC= 21.4, afeb -cultures pending  Plan: -Unasyn 3gm IV q8h -Will follow renal function, cultures and clinical progress   Height: '5\' 8"'$  (172.7 cm) Weight: 71.2 kg (157 lb) IBW/kg (Calculated) : 68.4  Temp (24hrs), Avg:98.1 F (36.7 C), Min:97.3 F (36.3 C), Max:99.6 F (37.6 C)  Recent Labs  Lab 07/03/22 0304 07/04/22 0657 07/05/22 0354  WBC 5.6  --  21.4*  CREATININE 1.20 1.01 1.30*    Estimated Creatinine Clearance: 43.8 mL/min (A) (by C-G formula based on SCr of 1.3 mg/dL (H)).    No Known Allergies  Antimicrobials this admission: 8/18 unasyn  Dose adjustments this admission:   Microbiology results: 8/17 urine 8/17 blood x2  Thank you for allowing pharmacy to be a part of this patient's care.  Hildred Laser, PharmD Clinical Pharmacist **Pharmacist phone directory can now be found on [email or web address] ([email or web address]).  Listed under [place].

## 2022-07-05 NOTE — Progress Notes (Signed)
PROGRESS NOTE    [name]  EUM:[id number] DOB: 11-29-1940 DOA: 07/03/2022 PCP: Kathyrn Lass, MD     Brief Narrative:   81 year old male with past medical history of dementia, PAF (not on full dose anticoagulation due to multiple bleeding complications),diet controlled dm2,   left eye blindness,  who is sent to  [facility] emergency department by cardiology clinic due to concerns for complete heart block, transferred to [facility] for EP eval Currently having goals of care discussion   Subjective:  RN reported shaking event yesterday evening, ua obtained + many bacteria, concerning for uti, blood culture in process  He is demented, he appears is comfortable and is stronger this am, wife at bedside   Assessment & Plan:  Principal Problem:   Complete heart block ([name]) Active Problems:   Paroxysmal atrial fibrillation ([name])   Type 2 diabetes mellitus without complication, without long-term current use of insulin ([address])   GERD without esophagitis   Heart block    Assessment and Plan:  * Complete heart block (HCC)/paroxysmal A-fib Sent to the hospital due to concerning for complete heart block the evening of 8/15 on the event monitor,  Patient is not on any AV nodal blocking agents at home, tsh unremarkable  Management per cardiology and EP, currently having goals of care discussion  Paroxysmal atrial fibrillation ([name]) Patient has not been on full dose anticoagulation for at least 2 months due to bleeding complications such as severe epistaxis and has instead been using aspirin 81 mg daily Patient additionally is not on AV nodal blocking therapy and typically is rate controlled Will follow cardiology recommendation   Type 2 diabetes mellitus without complication, without long-term current use of insulin ([name]) Patient been placed on Accu-Cheks before every meal and nightly with sliding scale insulin Hemoglobin A1C 5.6 Diabetic Diet   GERD without  esophagitis Continuing home regimen of daily PPI therapy.  Dementia At risk of delirium/sundowning   UTI, with leukocytosis , elevation of creatinine and shaking chills on 8/17 evening  empirically started IV antibiotic, follow-up on urine culture Ordered bladder scan to measure post void residual, follow-up on result    I have Reviewed nursing notes, Vitals, pain scores, I/o's, Lab results and  imaging results since pt's last encounter, details please see discussion above  I ordered the following labs:  Unresulted Labs (From admission, onward)     Start     Ordered   07/06/22 0500  CBC with Differential/Platelet  Tomorrow morning,   R       Question:  Specimen collection method  Answer:  Lab=Lab collect   07/05/22 2252   07/06/22 5400  Basic metabolic panel  Tomorrow morning,   R       Question:  Specimen collection method  Answer:  Lab=Lab collect   07/05/22 2252             DVT prophylaxis: SCDs Start: 07/03/22 0655   Code Status:   Code Status: Full Code  Family Communication: Wife at bedside Disposition:    Dispo: The patient is from: Home              Anticipated d/c is to: TBD              Anticipated d/c date is: TBD, needs cardiology clearance, ongoing goals of care discussion, follow-up on urine culture  Antimicrobials:    Anti-infectives (From admission, onward)    Start     Dose/Rate Route Frequency Ordered Stop  07/05/22 0945  Ampicillin-Sulbactam (UNASYN) 3 g in sodium chloride 0.9 % 100 mL IVPB        3 g 200 mL/hr over 30 Minutes Intravenous Every 8 hours 07/05/22 0856            Objective: Vitals:   07/05/22 0019 07/05/22 0435 07/05/22 0739 07/05/22 1928  BP: 117/70 118/72 137/75 108/62  Pulse: 70 72 (!) 101   Resp: (!) 23 (!) 23 20 (!) 24  Temp: 99.6 F (37.6 C) 98.5 F (36.9 C) (!) 97.3 F (36.3 C) 97.7 F (36.5 C)  TempSrc: Axillary Axillary Oral Oral  SpO2: 99% 99% 99%   Weight:      Height:        Intake/Output Summary  (Last 24 hours) at 07/05/2022 2253 Last data filed at 07/05/2022 1900 Gross per 24 hour  Intake --  Output 350 ml  Net -350 ml   Filed Weights   07/03/22 0133  Weight: 71.2 kg    Examination:  General exam: alert, awake, demented, left eye blind Respiratory system: Clear to auscultation. Respiratory effort normal. Cardiovascular system:  IRRR.  Gastrointestinal system: Abdomen is nondistended, soft and nontender.  Normal bowel sounds heard. Central nervous system: Alert, demented Extremities:  no edema Skin: No rashes, lesions or ulcers Psychiatry: Currently no agitation    Data Reviewed: I have personally reviewed  labs and visualized  imaging studies since the last encounter and formulate the plan        Scheduled Meds:  insulin aspart  0-9 Units Subcutaneous TID AC & HS   mirtazapine  15 mg Oral QHS   pantoprazole  40 mg Oral BID AC   QUEtiapine  25 mg Oral QHS   Continuous Infusions:  ampicillin-sulbactam (UNASYN) IV 3 g (07/05/22 1623)     LOS: 1 day     Florencia Reasons, MD PhD FACP [facility]  Available via Epic secure chat 7am-7pm for nonurgent issues Please page for urgent issues To page the attending provider between 7A-7P or the covering provider during after hours 7P-7A, please log into the web site [email or web address] and access using universal [place] password for that web site. If you do not have the password, please call the hospital operator.    07/05/2022, 10:53 PM

## 2022-07-06 DIAGNOSIS — Z7189 Other specified counseling: Secondary | ICD-10-CM

## 2022-07-06 DIAGNOSIS — N3000 Acute cystitis without hematuria: Secondary | ICD-10-CM

## 2022-07-06 DIAGNOSIS — I442 Atrioventricular block, complete: Secondary | ICD-10-CM | POA: Diagnosis not present

## 2022-07-06 DIAGNOSIS — K219 Gastro-esophageal reflux disease without esophagitis: Secondary | ICD-10-CM | POA: Diagnosis not present

## 2022-07-06 DIAGNOSIS — I459 Conduction disorder, unspecified: Secondary | ICD-10-CM | POA: Diagnosis not present

## 2022-07-06 LAB — BASIC METABOLIC PANEL
Anion gap: 17 — ABNORMAL HIGH (ref 5–15)
Anion gap: 5 (ref 5–15)
BUN: 26 mg/dL — ABNORMAL HIGH (ref 8–23)
BUN: 23 mg/dL (ref 8–23)
CO2: 18 mmol/L — ABNORMAL LOW (ref 22–32)
CO2: 25 mmol/L (ref 22–32)
Calcium: 8.8 mg/dL — ABNORMAL LOW (ref 8.9–10.3)
Calcium: 8.5 mg/dL — ABNORMAL LOW (ref 8.9–10.3)
Chloride: 103 mmol/L (ref 98–111)
Chloride: 107 mmol/L (ref 98–111)
Creatinine, Ser: 1.1 mg/dL (ref 0.61–1.24)
Creatinine, Ser: 1.08 mg/dL (ref 0.61–1.24)
GFR, Estimated: >60 mL/min (ref >60)
GFR, Estimated: >60 mL/min (ref >60)
Glucose, Bld: 117 mg/dL — ABNORMAL HIGH (ref 70–99)
Glucose, Bld: 77 mg/dL (ref 70–99)
Potassium: 3.4 mmol/L — ABNORMAL LOW (ref 3.5–5.1)
Potassium: 4.2 mmol/L (ref 3.5–5.1)
Sodium: 138 mmol/L (ref 135–145)
Sodium: 137 mmol/L (ref 135–145)

## 2022-07-06 LAB — GLUCOSE, CAPILLARY
Glucose-Capillary: 105 mg/dL — ABNORMAL HIGH (ref 70–99)
Glucose-Capillary: 204 mg/dL — ABNORMAL HIGH (ref 70–99)
Glucose-Capillary: 78 mg/dL (ref 70–99)
Glucose-Capillary: 126 mg/dL — ABNORMAL HIGH (ref 70–99)

## 2022-07-06 LAB — CBC WITH DIFFERENTIAL/PLATELET
Abs Immature Granulocytes: 0.04 10*3/uL (ref 0.00–0.07)
Basophils Absolute: 0 10*3/uL (ref 0.0–0.1)
Basophils Relative: 0 %
Eosinophils Absolute: 0.1 10*3/uL (ref 0.0–0.5)
Eosinophils Relative: 1 %
HCT: 41 % (ref 39.0–52.0)
Hemoglobin: 14.1 g/dL (ref 13.0–17.0)
Immature Granulocytes: 0 %
Lymphocytes Relative: 15 %
Lymphs Abs: 1.6 10*3/uL (ref 0.7–4.0)
MCH: 30.7 pg (ref 26.0–34.0)
MCHC: 34.4 g/dL (ref 30.0–36.0)
MCV: 89.1 fL (ref 80.0–100.0)
Monocytes Absolute: 1 10*3/uL (ref 0.1–1.0)
Monocytes Relative: 9 %
Neutro Abs: 8.2 10*3/uL — ABNORMAL HIGH (ref 1.7–7.7)
Neutrophils Relative %: 75 %
Platelets: 178 10*3/uL (ref 150–400)
RBC: 4.6 MIL/uL (ref 4.22–5.81)
RDW: 12.9 % (ref 11.5–15.5)
WBC: 10.9 10*3/uL — ABNORMAL HIGH (ref 4.0–10.5)
nRBC: 0 % (ref 0.0–0.2)

## 2022-07-06 MED ORDER — SODIUM CHLORIDE 0.9 % IV SOLN
INTRAVENOUS | Status: DC
Start: 2022-07-06 — End: 2022-07-07

## 2022-07-06 MED ORDER — MAGNESIUM OXIDE -MG SUPPLEMENT 400 (240 MG) MG PO TABS
200.0000 mg | ORAL_TABLET | Freq: Two times a day (BID) | ORAL | Status: DC
  Administered 2022-07-06 – 2022-07-07 (×3): 200 mg via ORAL
  Filled 2022-07-06 (×3): qty 1

## 2022-07-06 MED ORDER — POTASSIUM CHLORIDE CRYS ER 20 MEQ PO TBCR
40.0000 meq | EXTENDED_RELEASE_TABLET | Freq: Once | ORAL | Status: AC
  Administered 2022-07-06: 40 meq via ORAL
  Filled 2022-07-06: qty 2

## 2022-07-06 MED ORDER — CEFTRIAXONE SODIUM 1 G IJ SOLR
1.0000 g | INTRAMUSCULAR | Status: DC
  Administered 2022-07-06 – 2022-07-07 (×2): 1 g via INTRAVENOUS
  Filled 2022-07-06 (×2): qty 10

## 2022-07-06 MED ORDER — LACTATED RINGERS IV SOLN: INTRAVENOUS | Status: DC

## 2022-07-06 MED ORDER — SODIUM CHLORIDE 0.9 % IV SOLN: 2.0000 g | INTRAVENOUS | Status: DC

## 2022-07-06 NOTE — Progress Notes (Signed)
Palliative Medicine Inpatient Follow Up Note   HPI: Per intake H&P --> 81 year old male with past medical history of diabetes mellitus type 2 (appears diet controlled, not on meds at home), paroxysmal atrial fibrillation (not on full dose anticoagulation due to multiple bleeding complications),  left eye blindness,  who is sent to  [facility] emergency department by cardiology clinic due to concerns for complete heart block. Palliative care has been asked to get involved in the setting of an e/o complete heart block for further goals of care conversations.   No eligible for pacemaker. Current UTI.   Today's Discussion 07/06/2022  Chart reviewed inclusive of vital signs, progress notes, laboratory results, and diagnostic images.   Met with patient, wife, [name] [phone]) and son [name]. Wife shared that their daughter is out of town until [place] and they have not discussed her husband's condition with her yet.   Created space and opportunity for patient to explore thoughts feelings and fears regarding current medical situation. They want him to be comfortable.   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 He loves fishing but has not been able to do this recently. She is his home caregiver. She reports he has loss significant weight and has been declining over the last two weeks. She works part of a day one day a week. If she is not present with him another member of the family is. I validated her concerns.  She shared that prior to admission he was able to walk around the house and they do have a door lock to keep him from leaving the house without someone. One day the lock was not on and she dozed off napping, he walked down the road to his brother's house. She said this was a long time ago and has not happened since. They do have family support at home. They want to keep him comfortable.  She has not read the book on Hard  Choice for Living yet. She brought it with her to the hospital to read while with him today.   Questions and concerns addressed  I answered questions about what CHB is and how it can impact [name].  Palliative Support Provided. I reiterated yesterday's conversation with palliative with recommendation for DNR order.  Currently he denies pain or SOB.   Objective Assessment: Vital Signs Vitals:   07/06/22 0510 07/06/22 0748  BP: 123/68   Pulse:    Resp: 19 19  Temp: 98 F (36.7 C)   SpO2:     Pulse 58  Intake/Output Summary (Last 24 hours) at 07/06/2022 1128 Last data filed at 07/05/2022 1900 Gross per 24 hour  Intake --  Output 350 ml  Net -350 ml   Last Weight  Most recent update: 07/03/2022  1:33 AM    Weight  71.2 kg (157 lb)             Gen:  NAD HEENT: moist mucous membranes CV: Irregular rate and rhythm PULM: clear to auscultation bilaterally. No wheezes/rales/rhonchi ABD: soft/nontender/nondistended/normal bowel sounds EXT: No edema Neuro: Alert and oriented to self, recognized wife and son.   PPS: 50%  SUMMARY OF RECOMMENDATIONS   CODE STATUS: FULL CODE- meet with family again tomorrow once daughter has been involved in conversations.   Palliative prophylaxis:  aspiration precautions, bowel regimen, delirium protocol, frequent symptom assessment, oral care, turn and position  Pain: Acetaminophen prn Anxiety: Lorazepam 0.5  every  6 hours as needed Dementia: Continue mirtazapine, seroquel Nausea: ondansetron as needed  Additional Recommendations Current scope of care  Psychosocial/Spiritual:  No desire for further chaplain support Continue education on CHB Palliative medicine will plan to  meet with family again tomorrow  Prognosis: unclear with CHB and inability to anticoagulate and place pacemaker.  Discharge Planning:   Home once medically optimized OP Palliative for ongoing [place] conversations  Time Spent: 30 minutes  Billing based on  MDM: High  Problems Addressed: One acute or chronic illness or injury that poses a threat to life or bodily function  Amount and/or Complexity of Data: Category 3:Discussion of management or test interpretation with external physician/other qualified health care professional/appropriate source (not separately reported)  Risks: Decision regarding hospitalization or escalation of hospital care and Decision not to resuscitate or to de-escalate care because of poor prognosis ______________________________________________________________________________________ Brandon Spar, NP  [place] Team Team Cell Phone: [phone] Please utilize secure chat with additional questions, if there is no response within 30 minutes please call the above phone number  Palliative Medicine Team providers are available by phone from 7am to 7pm daily and can be reached through the team cell phone.  Should this patient require assistance outside of these hours, please call the patient's attending physician. See Amion for coverage.

## 2022-07-06 NOTE — Progress Notes (Signed)
Progress Note  Patient Name: [name] Date of Encounter: 07/06/2022  Primary Cardiologist: Belva Crome III, MD   Subjective   No chest pain or sob.   Inpatient Medications    Scheduled Meds:  insulin aspart  0-9 Units Subcutaneous TID AC & HS   mirtazapine  15 mg Oral QHS   pantoprazole  40 mg Oral BID AC   potassium chloride  40 mEq Oral Once   QUEtiapine  25 mg Oral QHS   Continuous Infusions:  ampicillin-sulbactam (UNASYN) IV 3 g (07/06/22 0116)   lactated ringers 75 mL/hr at 07/06/22 0823   PRN Meds: acetaminophen **OR** acetaminophen, LORazepam, ondansetron **OR** ondansetron (ZOFRAN) IV, polyethylene glycol   Vital Signs    Vitals:   07/05/22 0739 07/05/22 1928 07/06/22 0510 07/06/22 0748  BP: 137/75 108/62 123/68   Pulse: (!) 101     Resp: 20 (!) '24 19 19  '$ Temp: (!) 97.3 F (36.3 C) 97.7 F (36.5 C) 98 F (36.7 C)   TempSrc: Oral Oral Oral   SpO2: 99%     Weight:      Height:        Intake/Output Summary (Last 24 hours) at 07/06/2022 1021 Last data filed at 07/05/2022 1900 Gross per 24 hour  Intake --  Output 350 ml  Net -350 ml   Filed Weights   07/03/22 0133  Weight: 71.2 kg    Telemetry    Nsr with AVWB - Personally Reviewed  ECG    none - Personally Reviewed  Physical Exam   GEN: No acute distress.   Neck: No JVD Cardiac: RRR, no murmurs, rubs, or gallops.  Respiratory: Clear to auscultation bilaterally. GI: Soft, nontender, non-distended  MS: No edema; No deformity. Neuro:  Nonfocal  Psych: Normal affect   Labs    Chemistry Recent Labs  Lab 07/04/22 0657 07/05/22 0354 07/06/22 0251  NA 139 138 138  K 3.7 3.6 3.4*  CL 107 104 103  CO2 25 21* 18*  GLUCOSE 92 106* 117*  BUN 17 23 26*  CREATININE 1.01 1.30* 1.10  CALCIUM 9.1 8.8* 8.8*  GFRNONAA >60 56* >60  ANIONGAP 7 13 17*     Hematology Recent Labs  Lab 07/03/22 0304 07/05/22 0354 07/06/22 0251  WBC 5.6 21.4* 10.9*  RBC 4.93 4.90 4.60  HGB  14.8 14.9 14.1  HCT 45.7 44.1 41.0  MCV 92.7 90.0 89.1  MCH 30.0 30.4 30.7  MCHC 32.4 33.8 34.4  RDW 12.9 12.7 12.9  PLT 257 228 178    Cardiac EnzymesNo results for input(s): "TROPONINI" in the last 168 hours. No results for input(s): "TROPIPOC" in the last 168 hours.   BNPNo results for input(s): "BNP", "PROBNP" in the last 168 hours.   DDimer No results for input(s): "DDIMER" in the last 168 hours.   Radiology    No results found.  Cardiac Studies   none  Patient Profile     81 y.o. male admitted with variable av block  Assessment & Plan    AVWB - as he has a narrow QRS and appears to be asymptomatic, no indication for PPM at this time. On my visit he was conducting 1:1. Avoid AV nodal blocking drugs.  Probable UTI - on IV Unasyn. Continue.      For questions or updates, please contact [place] Please consult [email or web address] for contact info under Cardiology/STEMI.      Signed, Cristopher Peru, MD  07/06/2022, 10:21 AM

## 2022-07-06 NOTE — Progress Notes (Signed)
PROGRESS NOTE    [name]  LFY:[id number] DOB: [birth year] DOA: 07/03/2022 PCP: Kathyrn Lass, MD   Brief Narrative: 81 year old with past medical history significant for dementia, PAF not on full dose anticoagulation due to multiple bleeding complications, diabetes type 2 diet-controlled, left eye blindness presents to the ED referred by his cardiology due to concern for complete heart block on event monitor.  Patient is currently not on any AV nodal blocking agents, TSH was unremarkable.  He has been also diagnosed with UTI.  Evaluated by Dr. Crissie Sickles with EP for AVWP, patient appears to be asymptomatic, so no indication for PPM at this time.  Palliative care has been consulted, undergoing goals of care.   Assessment & Plan:   Principal Problem:   Complete heart block (HCC) Active Problems:   Paroxysmal atrial fibrillation (HCC)   Type 2 diabetes mellitus without complication, without long-term current use of insulin (HCC)   GERD without esophagitis   Heart block   Counseling regarding advance care planning and goals of care   Acute cystitis without hematuria  1-AVWP Patient appears to be asymptomatic per cardiology, no indication for PPM at this time. Continue goals of care discussion with palliative care Replete electrolytes EP following  2-UTI: Urine culture growing Klebsiella pneumoniae and resistant to Unasyn. Change IV antibiotics to ceftriaxone.   Paroxysmal A-fib: Not on anticoagulation for at least 2 months due to bleeding complications. Avoid AV nodal blocking agents due to heart block  Diabetes type 2 Continue with sliding scale insulin  GERD without esophagitis: Continue with PPI  Hypokalemia; replete orally.   Metabolic acidosis: Increased anion gap, but blood sugar was 117 this morning. Started IV fluids. Repeat bmet in am and this afternoon.    Estimated body mass index is 23.87 kg/m as calculated from the following:   Height as of this  encounter: '5\' 8"'$  (1.727 m).   Weight as of this encounter: 71.2 kg.   DVT prophylaxis: SCD Code Status: Full code Family Communication: wife who was at bedside.  Disposition Plan:  Status is: Inpatient Remains inpatient appropriate because: management UTI, Heart block.     Consultants:  Cardiology   Procedures:    Antimicrobials:  IV ceftriaxone  Subjective: He is alert, report arthritis pain hands.    Objective: Vitals:   07/05/22 1928 07/06/22 0510 07/06/22 0748 07/06/22 1334  BP: 108/62 123/68  (!) 117/59  Pulse:    79  Resp: (!) '24 19 19 14  '$ Temp: 97.7 F (36.5 C) 98 F (36.7 C)  98 F (36.7 C)  TempSrc: Oral Oral  Oral  SpO2:    97%  Weight:      Height:        Intake/Output Summary (Last 24 hours) at 07/06/2022 1600 Last data filed at 07/05/2022 1900 Gross per 24 hour  Intake --  Output 350 ml  Net -350 ml   Filed Weights   07/03/22 0133  Weight: 71.2 kg    Examination:  General exam: Appears calm and comfortable  Respiratory system: Clear to auscultation. Respiratory effort normal. Cardiovascular system: S1 & S2 heard, RRR. Gastrointestinal system: Abdomen is nondistended, soft and nontender. No organomegaly or masses felt. Normal bowel sounds heard. Central nervous system: Alert, follows command.  Extremities: Symmetric 5 x 5 power.    Data Reviewed: I have personally reviewed following labs and imaging studies  CBC: Recent Labs  Lab 07/03/22 0304 07/05/22 0354 07/06/22 0251  WBC 5.6 21.4* 10.9*  NEUTROABS 2.0  19.6* 8.2*  HGB 14.8 14.9 14.1  HCT 45.7 44.1 41.0  MCV 92.7 90.0 89.1  PLT 257 228 528   Basic Metabolic Panel: Recent Labs  Lab 07/03/22 0304 07/04/22 0657 07/05/22 0354 07/06/22 0251  NA 141 139 138 138  K 3.9 3.7 3.6 3.4*  CL 107 107 104 103  CO2 27 25 21* 18*  GLUCOSE 97 92 106* 117*  BUN '23 17 23 '$ 26*  CREATININE 1.20 1.01 1.30* 1.10  CALCIUM 9.1 9.1 8.8* 8.8*  MG 2.1 2.0 1.9  --    GFR: Estimated  Creatinine Clearance: 51.8 mL/min (by C-G formula based on SCr of 1.1 mg/dL). Liver Function Tests: No results for input(s): "AST", "ALT", "ALKPHOS", "BILITOT", "PROT", "ALBUMIN" in the last 168 hours. No results for input(s): "LIPASE", "AMYLASE" in the last 168 hours. No results for input(s): "AMMONIA" in the last 168 hours. Coagulation Profile: Recent Labs  Lab 07/03/22 0701  INR 1.1   Cardiac Enzymes: No results for input(s): "CKTOTAL", "CKMB", "CKMBINDEX", "TROPONINI" in the last 168 hours. BNP (last 3 results) No results for input(s): "PROBNP" in the last 8760 hours. HbA1C: No results for input(s): "HGBA1C" in the last 72 hours. CBG: Recent Labs  Lab 07/05/22 1146 07/05/22 1618 07/05/22 2159 07/06/22 0837 07/06/22 1129  GLUCAP 104* 176* 138* 105* 204*   Lipid Profile: No results for input(s): "CHOL", "HDL", "LDLCALC", "TRIG", "CHOLHDL", "LDLDIRECT" in the last 72 hours. Thyroid Function Tests: No results for input(s): "TSH", "T4TOTAL", "FREET4", "T3FREE", "THYROIDAB" in the last 72 hours. Anemia Panel: No results for input(s): "VITAMINB12", "FOLATE", "FERRITIN", "TIBC", "IRON", "RETICCTPCT" in the last 72 hours. Sepsis Labs: No results for input(s): "PROCALCITON", "LATICACIDVEN" in the last 168 hours.  Recent Results (from the past 240 hour(s))  Urine Culture     Status: Abnormal (Preliminary result)   Collection Time: 07/04/22  4:25 PM   Specimen: Urine, Clean Catch  Result Value Ref Range Status   Specimen Description URINE, CLEAN CATCH  Final   Special Requests   Final    NONE Performed at [facility], [address], [place], [place] [address]    Culture >=100,000 COLONIES/mL KLEBSIELLA PNEUMONIAE (A)  Final   Report Status PENDING  Incomplete   Organism ID, Bacteria KLEBSIELLA PNEUMONIAE (A)  Final      Susceptibility   Klebsiella pneumoniae - MIC*    AMPICILLIN >=32 RESISTANT Resistant     CEFAZOLIN <=4 SENSITIVE Sensitive     CEFTRIAXONE <=0.25  SENSITIVE Sensitive     CIPROFLOXACIN <=0.25 SENSITIVE Sensitive     GENTAMICIN <=1 SENSITIVE Sensitive     IMIPENEM <=0.25 SENSITIVE Sensitive     NITROFURANTOIN <=16 SENSITIVE Sensitive     TRIMETH/SULFA <=20 SENSITIVE Sensitive     AMPICILLIN/SULBACTAM 16 INTERMEDIATE Intermediate     * >=100,000 COLONIES/mL KLEBSIELLA PNEUMONIAE  Culture, blood (Routine X 2) w Reflex to ID Panel     Status: None (Preliminary result)   Collection Time: 07/04/22  8:57 PM   Specimen: BLOOD  Result Value Ref Range Status   Specimen Description BLOOD RIGHT ANTECUBITAL  Final   Special Requests   Final    BOTTLES DRAWN AEROBIC AND ANAEROBIC Blood Culture results may not be optimal due to an inadequate volume of blood received in culture bottles   Culture   Final    NO GROWTH 2 DAYS Performed at [facility] [address], [place], [place] [address]    Report Status PENDING  Incomplete  Culture, blood (Routine X 2) w Reflex to ID Panel     Status: None (Preliminary result)   Collection Time: 07/04/22  8:57 PM   Specimen: BLOOD  Result Value Ref Range Status   Specimen Description BLOOD BLOOD RIGHT ARM  Final   Special Requests   Final    BOTTLES DRAWN AEROBIC AND ANAEROBIC Blood Culture adequate volume   Culture   Final    NO GROWTH 2 DAYS Performed at [facility], [address] [place], [place] [address]    Report Status PENDING  Incomplete         Radiology Studies: No results found.      Scheduled Meds:  insulin aspart  0-9 Units Subcutaneous TID AC & HS   magnesium oxide  200 mg Oral BID   mirtazapine  15 mg Oral QHS   pantoprazole  40 mg Oral BID AC   QUEtiapine  25 mg Oral QHS   Continuous Infusions:  cefTRIAXone (ROCEPHIN)  IV     lactated ringers 75 mL/hr at 07/06/22 0823     LOS: 2 days    Time spent: 35 minutes.     Elmarie Shiley, MD [facility]   If 7PM-7AM, please contact night-coverage [email or web address]  07/06/2022, 4:00 PM

## 2022-07-07 DIAGNOSIS — I442 Atrioventricular block, complete: Secondary | ICD-10-CM | POA: Diagnosis not present

## 2022-07-07 LAB — BASIC METABOLIC PANEL
Anion gap: 8 (ref 5–15)
BUN: 16 mg/dL (ref 8–23)
CO2: 23 mmol/L (ref 22–32)
Calcium: 8.7 mg/dL — ABNORMAL LOW (ref 8.9–10.3)
Chloride: 108 mmol/L (ref 98–111)
Creatinine, Ser: 1.08 mg/dL (ref 0.61–1.24)
GFR, Estimated: >60 mL/min (ref >60)
Glucose, Bld: 150 mg/dL — ABNORMAL HIGH (ref 70–99)
Potassium: 3.9 mmol/L (ref 3.5–5.1)
Sodium: 139 mmol/L (ref 135–145)

## 2022-07-07 LAB — CBC
HCT: 38.5 % — ABNORMAL LOW (ref 39.0–52.0)
Hemoglobin: 13.2 g/dL (ref 13.0–17.0)
MCH: 30.4 pg (ref 26.0–34.0)
MCHC: 34.3 g/dL (ref 30.0–36.0)
MCV: 88.7 fL (ref 80.0–100.0)
Platelets: 180 10*3/uL (ref 150–400)
RBC: 4.34 MIL/uL (ref 4.22–5.81)
RDW: 12.7 % (ref 11.5–15.5)
WBC: 5.6 10*3/uL (ref 4.0–10.5)
nRBC: 0 % (ref 0.0–0.2)

## 2022-07-07 LAB — GLUCOSE, CAPILLARY
Glucose-Capillary: 95 mg/dL (ref 70–99)
Glucose-Capillary: 127 mg/dL — ABNORMAL HIGH (ref 70–99)

## 2022-07-07 MED ORDER — MAGNESIUM OXIDE -MG SUPPLEMENT 400 (240 MG) MG PO TABS: 200.0000 mg | ORAL_TABLET | Freq: Two times a day (BID) | ORAL | 0 refills | Status: DC

## 2022-07-07 MED ORDER — QUETIAPINE FUMARATE 25 MG PO TABS: 25.0000 mg | ORAL_TABLET | Freq: Every day | ORAL | 0 refills | Status: DC

## 2022-07-07 MED ORDER — CEPHALEXIN 500 MG PO CAPS: 500.0000 mg | ORAL_CAPSULE | Freq: Three times a day (TID) | ORAL | 0 refills | Status: AC

## 2022-07-07 NOTE — Progress Notes (Signed)
Progress Note  Patient Name: [name] Date of Encounter: 07/07/2022  Primary Cardiologist: Sinclair Grooms, MD   Subjective   The patient looks and feels better today no complaints.  Inpatient Medications    Scheduled Meds:  insulin aspart  0-9 Units Subcutaneous TID AC & HS   magnesium oxide  200 mg Oral BID   mirtazapine  15 mg Oral QHS   pantoprazole  40 mg Oral BID AC   QUEtiapine  25 mg Oral QHS   Continuous Infusions:  sodium chloride 75 mL/hr at 07/06/22 2326   cefTRIAXone (ROCEPHIN)  IV 1 g (07/06/22 1635)   PRN Meds: acetaminophen **OR** acetaminophen, LORazepam, ondansetron **OR** ondansetron (ZOFRAN) IV, polyethylene glycol   Vital Signs    Vitals:   07/06/22 0748 07/06/22 1334 07/06/22 2024 07/07/22 0540  BP:  (!) 117/59 129/66 (!) 143/68  Pulse:  79    Resp: '19 14 20 14  '$ Temp:  98 F (36.7 C) 97.6 F (36.4 C) 98 F (36.7 C)  TempSrc:  Oral Oral Oral  SpO2:  97% 100% 100%  Weight:      Height:        Intake/Output Summary (Last 24 hours) at 07/07/2022 1030 Last data filed at 07/07/2022 0829 Gross per 24 hour  Intake --  Output 1450 ml  Net -1450 ml   Filed Weights   07/03/22 0133  Weight: 71.2 kg    Telemetry    Normal sinus rhythm with rare AV Wenke block- Personally Reviewed  ECG    None- Personally Reviewed  Physical Exam   GEN:  pleasant elderly man, no acute distress.   Neck: No JVD Cardiac: RRR, no murmurs, rubs, or gallops.  Respiratory: Clear to auscultation bilaterally. GI: Soft, nontender, non-distended  MS: No edema; No deformity. Neuro:  Nonfocal  Psych: Normal affect   Labs    Chemistry Recent Labs  Lab 07/06/22 0251 07/06/22 1638 07/07/22 0407  NA 138 137 139  K 3.4* 4.2 3.9  CL 103 107 108  CO2 18* 25 23  GLUCOSE 117* 77 150*  BUN 26* 23 16  CREATININE 1.10 1.08 1.08  CALCIUM 8.8* 8.5* 8.7*  GFRNONAA >60 >60 >60  ANIONGAP 17* 5 8     Hematology Recent Labs  Lab 07/05/22 0354  07/06/22 0251 07/07/22 0407  WBC 21.4* 10.9* 5.6  RBC 4.90 4.60 4.34  HGB 14.9 14.1 13.2  HCT 44.1 41.0 38.5*  MCV 90.0 89.1 88.7  MCH 30.4 30.7 30.4  MCHC 33.8 34.4 34.3  RDW 12.7 12.9 12.7  PLT 228 178 180    Cardiac EnzymesNo results for input(s): "TROPONINI" in the last 168 hours. No results for input(s): "TROPIPOC" in the last 168 hours.   BNPNo results for input(s): "BNP", "PROBNP" in the last 168 hours.   DDimer No results for input(s): "DDIMER" in the last 168 hours.   Radiology    No results found.  Cardiac Studies   None  Patient Profile     81 y.o. male admitted with a urinary tract infection and AV Wenke block with transient 2-1 heart block, asymptomatic.  Assessment & Plan    1.  AV Wenke block -the patient is asymptomatic.  He will not require permanent pacemaker insertion at this time.  No plans for more supportive care. [facility] will sign off.   Medication Recommendations: Avoid AV nodal blocking drugs Other recommendations (labs, testing, etc): None Follow up as an outpatient: As needed  For questions or updates, please contact [place] Please consult [email or web address] for contact info under Cardiology/STEMI.      Signed, Cristopher Peru, MD  07/07/2022, 10:30 AM

## 2022-07-07 NOTE — Evaluation (Addendum)
Occupational Therapy Evaluation and Discharge Patient Details Name: [name] MRN: [id number] DOB: 1941-04-08 Today's Date: 07/07/2022   History of Present Illness 81 year old male who presents to [facility] emergency department due to concerns for complete heart block. EKG: second-degree AV block Mobitz type I with variable degree of AV block with full degree of 2-1 AV block and suggestion of complete heart block.  He is also having some underlying junctional rhythm with some atrial bigeminy as well.PHMx: DM2, paroxysmal atrial fibrillation, HTN, hyperlipidemia, left eye blindness, remote history of septic arthritis of the knees (2015), ADD, dementia   Clinical Impression   This 81 yo male admitted with above presents to acute OT with PLOF of being able to do his own basic ADLs with only A for in and out of shower/tub. He currently is setup/S-Mod A for basic ADLs and min-mod A for basic mobility dependent on use of non use of DME for ambulation. He will continue to benefit from [facility] with plan to D/C pt to home today per chart, acute OT will sign off.   Pt needs a 3n1 due to his decreased balance to get from point A to point B even with use of an AD for mobility, making it more likely that he will not get to the bathroom in time and have an accident putting him at even more of an increased risk for falls and injury.     Recommendations for follow up therapy are one component of a multi-disciplinary discharge planning process, led by the attending physician.  Recommendations may be updated based on patient status, additional functional criteria and insurance authorization.   Follow Up Recommendations  Home health OT    Assistance Recommended at Discharge Frequent or constant Supervision/Assistance  Patient can return home with the following A lot of help with walking and/or transfers;A lot of help with bathing/dressing/bathroom;Assistance with cooking/housework;Assistance with  feeding;Help with stairs or ramp for entrance;Assist for transportation;Direct supervision/assist for financial management;Direct supervision/assist for medications management    Functional Status Assessment  Patient has had a recent decline in their functional status and demonstrates the ability to make significant improvements in function in a reasonable and predictable amount of time.  Equipment Recommendations  BSC/3in1;Other (comment) (RW)       Precautions / Restrictions Precautions Precautions: Fall Restrictions Weight Bearing Restrictions: No      Mobility Bed Mobility Overal bed mobility: Needs Assistance Bed Mobility: Supine to Sit, Sit to Supine     Supine to sit: Min assist, HOB elevated Sit to supine: Min guard   General bed mobility comments: for OOB needed A for trunk    Transfers Overall transfer level: Needs assistance Equipment used: 1 person hand held assist, Rolling walker (2 wheels) Transfers: Sit to/from Stand Sit to Stand: Min assist, Mod assist           General transfer comment: min A sit<>stand with RW, Mod A sit<>stand without RW. Min A to ambulate with RW, Mod A at times to ambulate without RW with balance loss      Balance Overall balance assessment: Needs assistance Sitting-balance support: No upper extremity supported, Feet supported Sitting balance-Leahy Scale: Fair     Standing balance support: Bilateral upper extremity supported, Reliant on assistive device for balance Standing balance-Leahy Scale: Poor                             ADL either performed or assessed with  clinical judgement   ADL Overall ADL's : Needs assistance/impaired Eating/Feeding: Set up;Supervision/ safety;Sitting Eating/Feeding Details (indicate cue type and reason): EOB Grooming: Sitting;Minimal assistance Grooming Details (indicate cue type and reason): EOB Upper Body Bathing: Minimal assistance;Sitting Upper Body Bathing Details (indicate  cue type and reason): EOB Lower Body Bathing: Moderate assistance Lower Body Bathing Details (indicate cue type and reason): min A sit<>stand Upper Body Dressing : Minimal assistance;Sitting Upper Body Dressing Details (indicate cue type and reason): EOB Lower Body Dressing: Moderate assistance Lower Body Dressing Details (indicate cue type and reason): Min A sit<>stand Toilet Transfer: Minimal assistance;Cueing for safety;Ambulation;Rolling walker (2 wheels) Toilet Transfer Details (indicate cue type and reason): simulated bed>out into hallway 15 feet>turn around>back to sit EOB; also min A stand pivot to [facility] Toileting- Clothing Manipulation and Hygiene: Moderate assistance Toileting - Clothing Manipulation Details (indicate cue type and reason): min A sit<>stand       General ADL Comments: Issued wife a gait belt for her to use with pt anytime he is up on is feet. Advised her to be with him anytime he is up on his feet, use the gait belt and the RW. Gave her information on Aging Gracefully program. Also recommended she have her Mr. Leazer wear Depends at night and get a waterproof mattress pad--helped her find one online at [facility] [place] which she will pick up. Wife reports pt is never left alone.     Vision Baseline Vision/History: 1 Wears glasses Ability to See in Adequate Light: 1 Impaired Additional Comments: missing left eye from several years back; there is always potential dementia related vision issues            Pertinent Vitals/Pain Pain Assessment Pain Assessment: No/denies pain     Hand Dominance Right   Extremity/Trunk Assessment Upper Extremity Assessment Upper Extremity Assessment: Overall WFL for tasks assessed           Communication Communication Communication: No difficulties   Cognition Arousal/Alertness: Awake/alert Behavior During Therapy: WFL for tasks assessed/performed Overall Cognitive Status: History of cognitive impairments - at baseline                                  General Comments: h/o dementia at baseline (diagnosed 3 years ago)                [place] expects to be discharged to:: Private residence Living Arrangements: Spouse/significant other Available Help at Discharge: Family;Available 24 hours/day Type of Home: House Home Access: Stairs to enter [employer] of Steps: 2 at back (no rail); 4 at front (rail)--they go in the back   Home Layout: One level     Bathroom Shower/Tub: Tub/shower unit;Walk-in shower   Bathroom Toilet: Standard     Home Equipment: Cane - single point;Shower seat;Hand held shower head          Prior Functioning/Environment Prior Level of Function : Needs assist  Cognitive Assist : Mobility (cognitive);ADLs (cognitive) Mobility (Cognitive): Set up cues ADLs (Cognitive): Set up cues       Mobility Comments: Per pt and wife he did not use an AD prior to this admission for ambulation ADLs Comments: Per the wife pt can bath and dress himself with only A needed in and out of shower        OT Problem List: Decreased strength;Impaired balance (sitting and/or standing);Impaired vision/perception;Decreased cognition;Decreased safety awareness  OT Goals(Current goals can be found in the care plan section) Acute Rehab OT Goals Patient Stated Goal: wife--to take pt home and get [facility] services OT Goal Formulation: With patient/family         AM-PAC OT "6 Clicks" Daily Activity     Outcome Measure Help from another person eating meals?: A Little Help from another person taking care of personal grooming?: A Little Help from another person toileting, which includes using toliet, bedpan, or urinal?: A Lot Help from another person bathing (including washing, rinsing, drying)?: A Lot Help from another person to put on and taking off regular upper body clothing?: A Little Help from another person to put on and taking off regular lower body  clothing?: A Lot 6 Click Score: 15   End of Session Equipment Utilized During Treatment: Gait belt;Rolling walker (2 wheels) Nurse Communication: Mobility status (what equipment had been ordered as well as HH therapies)  Activity Tolerance: Patient tolerated treatment well Patient left: in bed;with call bell/phone within reach;with bed alarm set (posey belt bed alarm on as well)  OT Visit Diagnosis: Unsteadiness on feet (R26.81);Other abnormalities of gait and mobility (R26.89);Muscle weakness (generalized) (M62.81);Other symptoms and signs involving cognitive function                Time: 7673-4193 OT Time Calculation (min): 64 min Charges:  OT General Charges $OT Visit: 1 Visit OT Evaluation $OT Eval Moderate Complexity: 1 Mod OT Treatments $Self Care/Home Management : 38-52 mins    Almon Register 07/07/2022, 12:49 PM

## 2022-07-07 NOTE — Progress Notes (Signed)
[facility] ([facility]) Hospital Liaison Note  Notified by TOC manager of patient/family request for [facility] palliative services at home after discharge.   [facility] hospital liaison will follow patient for discharge disposition.   Please call with any hospice or outpatient palliative care related questions.   Thank you for the opportunity to participate in this patient's care.   Shanita Wicker, LCSW [facility] Hospital Liaison [phone]  

## 2022-07-07 NOTE — Discharge Summary (Signed)
Physician Discharge Summary   Patient: [name] MRN: [id number] DOB: 1941-09-07  Admit date:     07/03/2022  Discharge date: 07/07/22  Discharge Physician: Elmarie Shiley   PCP: Kathyrn Lass, MD   Recommendations at discharge:   Follow up on resolution of UTI Follow up with cardiology as needed.    Discharge Diagnoses: Principal Problem:   Complete heart block (HCC) Active Problems:   Paroxysmal atrial fibrillation (HCC)   Type 2 diabetes mellitus without complication, without long-term current use of insulin (HCC)   GERD without esophagitis   Heart block   Counseling regarding advance care planning and goals of care   Acute cystitis without hematuria  Resolved Problems:   * No resolved hospital problems. *  Hospital Course: 81 year old with past medical history significant for dementia, PAF not on full dose anticoagulation due to multiple bleeding complications, diabetes type 2 diet-controlled, left eye blindness presents to the ED referred by his cardiology due to concern for complete heart block on event monitor.   Patient is currently not on any AV nodal blocking agents, TSH was unremarkable.  He has been also diagnosed with UTI.   Evaluated by Dr. Crissie Sickles with EP for AVWP, patient appears to be asymptomatic, so no indication for PPM at this time.   Palliative care has been consulted, undergoing goals of care. Patient was not able to make decision in regards code status. They would like palliative care follow out patient. Patient stable for discharge.   Assessment and Plan: 1-AVWP Patient appears to be asymptomatic per cardiology, no indication for PPM at this time. Replete electrolytes EP following No plans for more supportive care. Cardiology sign off.    2-UTI: Urine culture growing Klebsiella pneumoniae and resistant to Unasyn. Change IV antibiotics to ceftriaxone 8/19. He has received  2 doses of IV antibiotics during this hospitalizations. He will  be discharge on Keflex.    Paroxysmal A-fib: Not on anticoagulation for at least 2 months due to bleeding complications. Avoid AV nodal blocking agents due to heart block   Diabetes type 2 Continue with sliding scale insulin   GERD without esophagitis: Continue with PPI   Hypokalemia; Replaced.  Dementia; He was started on Seroquel. He has been tolerating it. Plan to give prescriptions.   Metabolic acidosis: Increased anion gap, but blood sugar was 117 this morning. Received IV fluids. Resolved.   Stable for discharge    Estimated body mass index is 23.87 kg/m as calculated from the following:   Height as of this encounter: '5\' 8"'$  (1.727 m).   Weight as of this encounter: 71.2 kg.         Consultants: Cardiology , palliative Procedures performed: None Disposition: Home Diet recommendation:  Discharge Diet Orders (From admission, onward)     Start     Ordered   07/07/22 0000  Diet - low sodium heart healthy        07/07/22 1109           Regular diet DISCHARGE MEDICATION: Allergies as of 07/07/2022   No Known Allergies      Medication List     STOP taking these medications    Eliquis 5 MG Tabs tablet Generic drug: apixaban       TAKE these medications    aspirin EC 81 MG tablet Take 1 tablet (81 mg total) by mouth daily. Swallow whole.   cephALEXin 500 MG capsule Commonly known as: KEFLEX Take 1 capsule (500 mg total) by  mouth 3 (three) times daily for 5 days.   magnesium oxide 400 (240 Mg) MG tablet Commonly known as: MAG-OX Take 0.5 tablets (200 mg total) by mouth 2 (two) times daily.   mirtazapine 15 MG tablet Commonly known as: REMERON Take 15 mg by mouth at bedtime.   omeprazole 20 MG capsule Commonly known as: PRILOSEC Take 20 mg by mouth 2 (two) times daily before a meal.   QUEtiapine 25 MG tablet Commonly known as: SEROQUEL Take 1 tablet (25 mg total) by mouth at bedtime.        Discharge Exam: Filed Weights    07/03/22 0133  Weight: 71.2 kg   General; NAD  Condition at discharge: stable  The results of significant diagnostics from this hospitalization (including imaging, microbiology, ancillary and laboratory) are listed below for reference.   Imaging Studies: ECHOCARDIOGRAM COMPLETE  Result Date: 07/02/2022    ECHOCARDIOGRAM REPORT   Patient Name:   [name] Date of Exam: 07/02/2022 Medical Rec #:  [id number]        Height:       68.0 in Accession #:    [id number]       Weight:       153.6 lb Date of Birth:  05-09-1941         BSA:          1.827 m Patient Age:    5 years         BP:           110/62 mmHg Patient Gender: M                HR:           74 bpm. Exam Location:  [place] Procedure: 2D Echo, Cardiac Doppler and Color Doppler Indications:    I48.0 Paroxysmal atrial fibrillation  History:        Patient has prior history of Echocardiogram examinations, most                 recent 11/03/2019. Pre-op evaluation, Arrythmias:Paroxysmal                 atrial fibrillation; Risk Factors:Hypertension and Dyslipidemia.  Sonographer:    Coralyn Helling RDCS Referring Phys: [id number] [place]  1. Left ventricular ejection fraction, by estimation, is 60 to 65%. The left ventricle has normal function. The left ventricle has no regional wall motion abnormalities. Left ventricular diastolic parameters were normal.  2. Right ventricular systolic function is normal. The right ventricular size is normal.  3. Left atrial size was moderately dilated.  4. The mitral valve is abnormal. Trivial mitral valve regurgitation. No evidence of mitral stenosis.  5. The aortic valve is tricuspid. There is mild calcification of the aortic valve. There is mild thickening of the aortic valve. Aortic valve regurgitation is not visualized. Aortic valve sclerosis is present, with no evidence of aortic valve stenosis.  6. The inferior vena cava is normal in size with greater than 50% respiratory  variability, suggesting right atrial pressure of 3 mmHg. FINDINGS  Left Ventricle: Left ventricular ejection fraction, by estimation, is 60 to 65%. The left ventricle has normal function. The left ventricle has no regional wall motion abnormalities. The left ventricular internal cavity size was normal in size. There is  no left ventricular hypertrophy. Left ventricular diastolic parameters were normal. Right Ventricle: The right ventricular size is normal. No increase in right ventricular wall thickness. Right ventricular systolic function is normal.  Left Atrium: Left atrial size was moderately dilated. Right Atrium: Right atrial size was normal in size. Pericardium: There is no evidence of pericardial effusion. Mitral Valve: The mitral valve is abnormal. There is mild thickening of the mitral valve leaflet(s). There is mild calcification of the mitral valve leaflet(s). Mild mitral annular calcification. Trivial mitral valve regurgitation. No evidence of mitral valve stenosis. Tricuspid Valve: The tricuspid valve is normal in structure. Tricuspid valve regurgitation is not demonstrated. No evidence of tricuspid stenosis. Aortic Valve: The aortic valve is tricuspid. There is mild calcification of the aortic valve. There is mild thickening of the aortic valve. Aortic valve regurgitation is not visualized. Aortic regurgitation PHT measures 427 msec. Aortic valve sclerosis is present, with no evidence of aortic valve stenosis. Pulmonic Valve: The pulmonic valve was normal in structure. Pulmonic valve regurgitation is not visualized. No evidence of pulmonic stenosis. Aorta: The aortic root is normal in size and structure. Venous: The inferior vena cava is normal in size with greater than 50% respiratory variability, suggesting right atrial pressure of 3 mmHg. IAS/Shunts: The interatrial septum was not well visualized.  LEFT VENTRICLE PLAX 2D LVIDd:         3.60 cm   Diastology LVIDs:         2.40 cm   LV e' medial:     6.20 cm/s LV PW:         1.00 cm   LV E/e' medial:  14.9 LV IVS:        1.10 cm   LV e' lateral:   9.68 cm/s LVOT diam:     2.00 cm   LV E/e' lateral: 9.5 LV SV:         76 LV SV Index:   41 LVOT Area:     3.14 cm  RIGHT VENTRICLE             IVC RV S prime:     14.00 cm/s  IVC diam: 0.70 cm TAPSE (M-mode): 1.7 cm RVSP:           31.1 mmHg LEFT ATRIUM             Index        RIGHT ATRIUM           Index LA diam:        4.60 cm 2.52 cm/m   RA Pressure: 3.00 mmHg LA Vol (A2C):   39.7 ml 21.70 ml/m  RA Area:     14.90 cm LA Vol (A4C):   42.8 ml 23.43 ml/m  RA Volume:   41.80 ml  22.88 ml/m LA Biplane Vol: 43.0 ml 23.54 ml/m  AORTIC VALVE LVOT Vmax:   120.00 cm/s LVOT Vmean:  77.100 cm/s LVOT VTI:    0.241 m AI PHT:      427 msec  AORTA Ao Root diam: 3.70 cm Ao Asc diam:  3.30 cm MITRAL VALVE                TRICUSPID VALVE MV Area (PHT): 2.71 cm     TR Peak grad:   28.1 mmHg MV Decel Time: 280 msec     TR Vmax:        265.00 cm/s MV E velocity: 92.20 cm/s   Estimated RAP:  3.00 mmHg MV A velocity: 114.00 cm/s  RVSP:           31.1 mmHg MV E/A ratio:  0.81  SHUNTS                             Systemic VTI:  0.24 m                             Systemic Diam: 2.00 cm Jenkins Rouge MD Electronically signed by Jenkins Rouge MD Signature Date/Time: 07/02/2022/10:14:39 AM    Final     Microbiology: Results for orders placed or performed during the hospital encounter of 07/03/22  Urine Culture     Status: Abnormal (Preliminary result)   Collection Time: 07/04/22  4:25 PM   Specimen: Urine, Clean Catch  Result Value Ref Range Status   Specimen Description URINE, CLEAN CATCH  Final   Special Requests   Final    NONE Performed at [facility], [place] [address] [place], [place] [address]    Culture >=100,000 COLONIES/mL KLEBSIELLA PNEUMONIAE (A)  Final   Report Status PENDING  Incomplete   Organism ID, Bacteria KLEBSIELLA PNEUMONIAE (A)  Final      Susceptibility   Klebsiella  pneumoniae - MIC*    AMPICILLIN >=32 RESISTANT Resistant     CEFAZOLIN <=4 SENSITIVE Sensitive     CEFTRIAXONE <=0.25 SENSITIVE Sensitive     CIPROFLOXACIN <=0.25 SENSITIVE Sensitive     GENTAMICIN <=1 SENSITIVE Sensitive     IMIPENEM <=0.25 SENSITIVE Sensitive     NITROFURANTOIN <=16 SENSITIVE Sensitive     TRIMETH/SULFA <=20 SENSITIVE Sensitive     AMPICILLIN/SULBACTAM 16 INTERMEDIATE Intermediate     * >=100,000 COLONIES/mL KLEBSIELLA PNEUMONIAE  Culture, blood (Routine X 2) w Reflex to ID Panel     Status: None (Preliminary result)   Collection Time: 07/04/22  8:57 PM   Specimen: BLOOD  Result Value Ref Range Status   Specimen Description BLOOD RIGHT ANTECUBITAL  Final   Special Requests   Final    BOTTLES DRAWN AEROBIC AND ANAEROBIC Blood Culture results may not be optimal due to an inadequate volume of blood received in culture bottles   Culture   Final    NO GROWTH 3 DAYS Performed at [facility] [address] [place], [place] [address]    Report Status PENDING  Incomplete  Culture, blood (Routine X 2) w Reflex to ID Panel     Status: None (Preliminary result)   Collection Time: 07/04/22  8:57 PM   Specimen: BLOOD  Result Value Ref Range Status   Specimen Description BLOOD BLOOD RIGHT ARM  Final   Special Requests   Final    BOTTLES DRAWN AEROBIC AND ANAEROBIC Blood Culture adequate volume   Culture   Final    NO GROWTH 3 DAYS Performed at [facility], [place] [address], [place],  [address]    Report Status PENDING  Incomplete    Labs: CBC: Recent Labs  Lab 07/03/22 0304 07/05/22 0354 07/06/22 0251 07/07/22 0407  WBC 5.6 21.4* 10.9* 5.6  NEUTROABS 2.0 19.6* 8.2*  --   HGB 14.8 14.9 14.1 13.2  HCT 45.7 44.1 41.0 38.5*  MCV 92.7 90.0 89.1 88.7  PLT 257 228 178 259   Basic Metabolic Panel: Recent Labs  Lab 07/03/22 0304 07/04/22 0657 07/05/22 0354 07/06/22 0251 07/06/22 1638 07/07/22 0407  NA 141 139 138 138 137 139  K 3.9 3.7 3.6  3.4* 4.2 3.9  CL 107 107 104 103 107 108  CO2 27 25 21* 18* 25 23  GLUCOSE 97 92 106* 117* 77 150*  BUN '23 17 23 '$ 26* 23 16  CREATININE 1.20 1.01 1.30* 1.10 1.08 1.08  CALCIUM 9.1 9.1 8.8* 8.8* 8.5* 8.7*  MG 2.1 2.0 1.9  --   --   --    Liver Function Tests: No results for input(s): "AST", "ALT", "ALKPHOS", "BILITOT", "PROT", "ALBUMIN" in the last 168 hours. CBG: Recent Labs  Lab 07/06/22 0837 07/06/22 1129 07/06/22 1613 07/06/22 2211 07/07/22 0737  GLUCAP 105* 204* 78 126* 95    Discharge time spent: greater than 30 minutes.  Signed: Elmarie Shiley, MD [facility] 07/07/2022

## 2022-07-07 NOTE — Evaluation (Signed)
Physical Therapy Evaluation Patient Details Name: [name] MRN: [id number] DOB: 1941-04-01 Today's Date: 07/07/2022  History of Present Illness  81 year old male who presents to [facility] emergency department due to concerns for complete heart block. EKG: second-degree AV block Mobitz type I with variable degree of AV block with full degree of 2-1 AV block and suggestion of complete heart block.  He is also having some underlying junctional rhythm with some atrial bigeminy as well.  PMH: DM2, paroxysmal atrial fibrillation, HTN, hyperlipidemia, left eye blindness, remote history of septic arthritis of the knees (2015), ADD, dementia  Clinical Impression  Patient presents with decreased mobility due to deficits listed in PT problem list.  Currently min A overall for mobility with RW, but no device at baseline.  Attempting without device pt with major LOB during turns.  Feel he will need follow up HHPT, wife assist and RW for home.  PT will not follow acutely as planned d/c home today.        Recommendations for follow up therapy are one component of a multi-disciplinary discharge planning process, led by the attending physician.  Recommendations may be updated based on patient status, additional functional criteria and insurance authorization.  Follow Up Recommendations Home health PT      Assistance Recommended at Discharge Frequent or constant Supervision/Assistance  Patient can return home with the following  Assistance with cooking/housework;Direct supervision/assist for financial management;Help with stairs or ramp for entrance;Direct supervision/assist for medications management;A lot of help with bathing/dressing/bathroom;A little help with walking and/or transfers;Assist for transportation    Equipment Recommendations Rolling walker (2 wheels)  Recommendations for Other Services       Functional Status Assessment Patient has had a recent decline in their functional  status and/or demonstrates limited ability to make significant improvements in function in a reasonable and predictable amount of time     Precautions / Restrictions Precautions Precautions: Fall Precaution Comments: blind L eye Restrictions Weight Bearing Restrictions: No      Mobility  Bed Mobility Overal bed mobility: Needs Assistance       Supine to sit: Supervision     General bed mobility comments: assist for lines, safety, increased time    Transfers Overall transfer level: Needs assistance Equipment used: Rolling walker (2 wheels) Transfers: Sit to/from Stand Sit to Stand: Min guard           General transfer comment: up from EOB to RW with A for balance; to toilet in bathroom with mod A due to confusion, urgency and incontinence; minguard to stand from toilet and assist for hygiene and gown & sock change due to soiled    Ambulation/Gait Ambulation/Gait assistance: Min guard, Min assist Gait Distance (Feet): 110 Feet (& 150') Assistive device: Rolling walker (2 wheels), None Gait Pattern/deviations: Step-through pattern, Decreased stride length       General Gait Details: shorter strides, minguard for balance and cues throughout for direction and walker safety; walked in room end of session no device, stable with straight path, but LOB with min A to recover with turns  Stairs Stairs: Yes Stairs assistance: Min assist Stair Management: Forwards, Step to pattern, One rail Left Number of Stairs: 2 General stair comments: patient without rails at home, but unable to get him to try due to rails on steps in stairwell, wife reports steps are small and only one then up to porch; educated placing walker on step for pt to use to step up and vice versa for step  down.  Wheelchair Mobility    Modified Rankin (Stroke Patients Only)       Balance Overall balance assessment: Needs assistance   Sitting balance-Leahy Scale: Good Sitting balance - Comments:  reaching to floor from sitting on toilet   Standing balance support: No upper extremity supported Standing balance-Leahy Scale: Fair Standing balance comment: able to stand without UE support statically, but encouraged UE support on walker for safety                             Pertinent Vitals/Pain Pain Assessment Pain Assessment: No/denies pain    Home Living Family/patient expects to be discharged to:: Private residence Living Arrangements: Spouse/significant other Available Help at Discharge: Family;Available 24 hours/day Type of Home: House Home Access: Stairs to enter   [employer] of Steps: 2 at back (no rail); 4 at front (rail)--they go in the back   Home Layout: One level Home Equipment: Cane - single point;Shower seat;Hand held shower head      Prior Function Prior Level of Function : Needs assist  Cognitive Assist : Mobility (cognitive);ADLs (cognitive) Mobility (Cognitive): Set up cues ADLs (Cognitive): Set up cues       Mobility Comments: Per pt and wife he did not use an AD prior to this admission for ambulation ADLs Comments: Per the wife pt can bath and dress himself with only A needed in and out of shower     Hand Dominance   Dominant Hand: Right    Extremity/Trunk Assessment   Upper Extremity Assessment Upper Extremity Assessment: Defer to OT evaluation    Lower Extremity Assessment Lower Extremity Assessment: Overall WFL for tasks assessed       Communication   Communication: No difficulties  Cognition Arousal/Alertness: Awake/alert Behavior During Therapy: WFL for tasks assessed/performed Overall Cognitive Status: History of cognitive impairments - at baseline                                 General Comments: h/o dementia at baseline (diagnosed 3 years ago)        General Comments General comments (skin integrity, edema, etc.): Wife in the room and setting up lunch tray end of session.  Educated on  stair technique walker at top (on porch) to ascend and on ground to descend.    Exercises     Assessment/Plan    PT Assessment All further PT needs can be met in the next venue of care  PT Problem List Decreased mobility;Decreased balance;Decreased knowledge of use of DME;Decreased strength;Decreased safety awareness;Decreased knowledge of precautions       PT Treatment Interventions      PT Goals (Current goals can be found in the Care Plan section)  Acute Rehab PT Goals PT Goal Formulation: All assessment and education complete, DC therapy    Frequency       Co-evaluation               AM-PAC PT "6 Clicks" Mobility  Outcome Measure Help needed turning from your back to your side while in a flat bed without using bedrails?: A Little Help needed moving from lying on your back to sitting on the side of a flat bed without using bedrails?: A Little Help needed moving to and from a bed to a chair (including a wheelchair)?: A Little Help needed standing up from a chair using your arms (  e.g., wheelchair or bedside chair)?: A Little Help needed to walk in hospital room?: A Little Help needed climbing 3-5 steps with a railing? : A Little 6 Click Score: 18    End of Session Equipment Utilized During Treatment: Gait belt Activity Tolerance: Patient tolerated treatment well Patient left: in bed;with family/visitor present;with call bell/phone within reach (sitting EOB to eat lunch) Nurse Communication: Mobility status PT Visit Diagnosis: Other abnormalities of gait and mobility (R26.89);Muscle weakness (generalized) (M62.81)    Time: 5694-3700 PT Time Calculation (min) (ACUTE ONLY): 30 min   Charges:   PT Evaluation $PT Eval Moderate Complexity: 1 Mod PT Treatments $Gait Training: 8-22 mins        Magda Kiel, PT Acute Rehabilitation Services [phone] 07/07/2022   Reginia Naas 07/07/2022, 2:40 PM

## 2022-07-07 NOTE — TOC Transition Note (Addendum)
Transition of Care (TOC) - CM/SW Discharge Note Marvetta Gibbons RN, BSN Transitions of Care Unit 4E- RN Case Manager See Treatment Team for direct phone #  Weekend Coverage   Patient Details  Name: [name] MRN: [id number] Date of Birth: Dec 29, 1940  Transition of Care [facility]) CM/SW Contact:  Brandon Patricia, RN Phone Number: 07/07/2022, 1:58 PM   Clinical Narrative:    Pt stable for transition home today. Orders placed for [facility] and DME needs.  CM spoke with pt and spouse at bedside. They are agreeable to [facility] services-  list provided for choice Per CMS guidelines from medicare.gov website with star ratings (copy placed in shadow chart) After review wife has selected either [facility] or [facility] for [facility] needs. Discussed outpt Palliative care- no preference agreeable to [facility] referral for ongoing outpt PC needs. Call made to on-call [facility] liaison- outpt PC referral made.   Address, phone # and PCP all confirmed in epic.   Pt needs RW and 3n1 for home- agreeable to use in house provider for DME needs.  Call made to [facility] with [employer] for DME- 3n1 and RW to be delivered to room prior to discharge.   Call made to [facility] for HHPT/OT referral- spoke w/ Kerry Dory- referral has been accepted.    Final next level of care: [place] Barriers to Discharge: No Barriers Identified   Patient Goals and CMS Choice Patient states their goals for this hospitalization and ongoing recovery are:: return home CMS Medicare.gov Compare Post Acute Care list provided to:: Patient Choice offered to / list presented to : Patient, Spouse  Discharge Placement               Home w/ [facility]        Discharge Plan and Services   Discharge Planning Services: CM Consult Post Acute Care Choice: Durable Medical Equipment, Home Health          DME Arranged: 3-N-1, Walker rolling DME Agency: [employer] Date DME Agency Contacted: 07/07/22 Time DME Agency Contacted:  86 Representative spoke with at DME Agency: [place]: PT, OT [place] Agency: [place] Date [place]: 07/07/22 Time [place]: 1345 Representative spoke with at [place]: [place] ([place]) Interventions     Readmission Risk Interventions    07/07/2022    1:58 PM  Readmission Risk Prevention Plan  Post Dischage Appt Complete  Medication Screening Complete  Transportation Screening Complete

## 2022-07-08 DIAGNOSIS — M1990 Unspecified osteoarthritis, unspecified site: Secondary | ICD-10-CM | POA: Diagnosis not present

## 2022-07-08 DIAGNOSIS — I480 Paroxysmal atrial fibrillation: Secondary | ICD-10-CM | POA: Diagnosis not present

## 2022-07-08 DIAGNOSIS — E785 Hyperlipidemia, unspecified: Secondary | ICD-10-CM | POA: Diagnosis not present

## 2022-07-08 DIAGNOSIS — N3000 Acute cystitis without hematuria: Secondary | ICD-10-CM | POA: Diagnosis not present

## 2022-07-08 DIAGNOSIS — Z7982 Long term (current) use of aspirin: Secondary | ICD-10-CM | POA: Diagnosis not present

## 2022-07-08 DIAGNOSIS — Z79899 Other long term (current) drug therapy: Secondary | ICD-10-CM | POA: Diagnosis not present

## 2022-07-08 DIAGNOSIS — Z87891 Personal history of nicotine dependence: Secondary | ICD-10-CM | POA: Diagnosis not present

## 2022-07-08 DIAGNOSIS — F0390 Unspecified dementia without behavioral disturbance: Secondary | ICD-10-CM | POA: Diagnosis not present

## 2022-07-08 DIAGNOSIS — I10 Essential (primary) hypertension: Secondary | ICD-10-CM | POA: Diagnosis not present

## 2022-07-08 DIAGNOSIS — E119 Type 2 diabetes mellitus without complications: Secondary | ICD-10-CM | POA: Diagnosis not present

## 2022-07-08 DIAGNOSIS — H547 Unspecified visual loss: Secondary | ICD-10-CM | POA: Diagnosis not present

## 2022-07-08 DIAGNOSIS — I442 Atrioventricular block, complete: Secondary | ICD-10-CM | POA: Diagnosis not present

## 2022-07-08 DIAGNOSIS — Z96642 Presence of left artificial hip joint: Secondary | ICD-10-CM | POA: Diagnosis not present

## 2022-07-08 DIAGNOSIS — K219 Gastro-esophageal reflux disease without esophagitis: Secondary | ICD-10-CM | POA: Diagnosis not present

## 2022-07-08 LAB — URINE CULTURE: Culture: >=100000 — AB

## 2022-07-09 ENCOUNTER — Telehealth: Payer: Self-pay

## 2022-07-09 LAB — CULTURE, BLOOD (ROUTINE X 2)
Culture: NO GROWTH
Special Requests: ADEQUATE

## 2022-07-09 NOTE — Telephone Encounter (Signed)
   Cardiac Monitor Alert  Date of alert:  07/09/2022   Patient Name: [name]  DOB: July 15, 1941  MRN: [id number]   [facility] Cardiologist: Sinclair Grooms, MD  [facility] EP:  Vickie Epley, MD    Monitor Information: Cardiac Event Monitor [[employer]]  Reason:  PAF Ordering provider:  Dr Tamala Julian   Alert 2nd degree AV Block, Type II This is the 1st alert for this rhythm.   Next Cardiology Appointment   Date:  6 mo recal for 11/2022  Provider:  Dr Tamala Julian  The patient was contacted today.  He is asymptomatic. Arrhythmia, symptoms and history reviewed with Dr Lovena Le, DOD.   Plan: Continue monitoring   Thora Lance, RN  07/09/2022 9:29 AM

## 2022-07-10 DIAGNOSIS — M1990 Unspecified osteoarthritis, unspecified site: Secondary | ICD-10-CM | POA: Diagnosis not present

## 2022-07-10 DIAGNOSIS — E119 Type 2 diabetes mellitus without complications: Secondary | ICD-10-CM | POA: Diagnosis not present

## 2022-07-10 DIAGNOSIS — I480 Paroxysmal atrial fibrillation: Secondary | ICD-10-CM | POA: Diagnosis not present

## 2022-07-10 DIAGNOSIS — I10 Essential (primary) hypertension: Secondary | ICD-10-CM | POA: Diagnosis not present

## 2022-07-10 DIAGNOSIS — I442 Atrioventricular block, complete: Secondary | ICD-10-CM | POA: Diagnosis not present

## 2022-07-10 DIAGNOSIS — E785 Hyperlipidemia, unspecified: Secondary | ICD-10-CM | POA: Diagnosis not present

## 2022-07-11 ENCOUNTER — Telehealth: Payer: Self-pay

## 2022-07-11 NOTE — Telephone Encounter (Signed)
Attempted to contact patient's spouse Carolyn to schedule a Palliative Care consult appointment. No answer left a message to return call.   

## 2022-07-13 DIAGNOSIS — E119 Type 2 diabetes mellitus without complications: Secondary | ICD-10-CM | POA: Diagnosis not present

## 2022-07-13 DIAGNOSIS — I480 Paroxysmal atrial fibrillation: Secondary | ICD-10-CM | POA: Diagnosis not present

## 2022-07-13 DIAGNOSIS — I442 Atrioventricular block, complete: Secondary | ICD-10-CM | POA: Diagnosis not present

## 2022-07-13 DIAGNOSIS — M1990 Unspecified osteoarthritis, unspecified site: Secondary | ICD-10-CM | POA: Diagnosis not present

## 2022-07-13 DIAGNOSIS — I10 Essential (primary) hypertension: Secondary | ICD-10-CM | POA: Diagnosis not present

## 2022-07-13 DIAGNOSIS — E785 Hyperlipidemia, unspecified: Secondary | ICD-10-CM | POA: Diagnosis not present

## 2022-07-15 DIAGNOSIS — I442 Atrioventricular block, complete: Secondary | ICD-10-CM | POA: Diagnosis not present

## 2022-07-15 DIAGNOSIS — I10 Essential (primary) hypertension: Secondary | ICD-10-CM | POA: Diagnosis not present

## 2022-07-15 DIAGNOSIS — M1990 Unspecified osteoarthritis, unspecified site: Secondary | ICD-10-CM | POA: Diagnosis not present

## 2022-07-15 DIAGNOSIS — E119 Type 2 diabetes mellitus without complications: Secondary | ICD-10-CM | POA: Diagnosis not present

## 2022-07-15 DIAGNOSIS — E785 Hyperlipidemia, unspecified: Secondary | ICD-10-CM | POA: Diagnosis not present

## 2022-07-15 DIAGNOSIS — I480 Paroxysmal atrial fibrillation: Secondary | ICD-10-CM | POA: Diagnosis not present

## 2022-07-16 ENCOUNTER — Other Ambulatory Visit: Payer: Self-pay

## 2022-07-16 ENCOUNTER — Encounter (HOSPITAL_COMMUNITY): Payer: Self-pay

## 2022-07-16 ENCOUNTER — Telehealth: Payer: Self-pay | Admitting: Interventional Cardiology

## 2022-07-16 ENCOUNTER — Telehealth: Payer: Self-pay

## 2022-07-16 ENCOUNTER — Emergency Department (HOSPITAL_COMMUNITY): Payer: Medicare Other

## 2022-07-16 ENCOUNTER — Emergency Department (HOSPITAL_COMMUNITY)
Admission: EM | Admit: 2022-07-16 | Discharge: 2022-07-16 | Disposition: A | Discharge status: Home / Self Care | Payer: Medicare Other | Attending: Emergency Medicine | Admitting: Emergency Medicine

## 2022-07-16 DIAGNOSIS — I10 Essential (primary) hypertension: Secondary | ICD-10-CM | POA: Insufficient documentation

## 2022-07-16 DIAGNOSIS — Z7982 Long term (current) use of aspirin: Secondary | ICD-10-CM | POA: Insufficient documentation

## 2022-07-16 DIAGNOSIS — Z96643 Presence of artificial hip joint, bilateral: Secondary | ICD-10-CM | POA: Insufficient documentation

## 2022-07-16 DIAGNOSIS — I441 Atrioventricular block, second degree: Secondary | ICD-10-CM | POA: Insufficient documentation

## 2022-07-16 DIAGNOSIS — F0390 Unspecified dementia without behavioral disturbance: Secondary | ICD-10-CM | POA: Diagnosis not present

## 2022-07-16 DIAGNOSIS — Z87891 Personal history of nicotine dependence: Secondary | ICD-10-CM | POA: Insufficient documentation

## 2022-07-16 DIAGNOSIS — E119 Type 2 diabetes mellitus without complications: Secondary | ICD-10-CM | POA: Insufficient documentation

## 2022-07-16 DIAGNOSIS — R079 Chest pain, unspecified: Secondary | ICD-10-CM | POA: Diagnosis not present

## 2022-07-16 LAB — CBC
HCT: 44.4 % (ref 39.0–52.0)
Hemoglobin: 14.5 g/dL (ref 13.0–17.0)
MCH: 30.1 pg (ref 26.0–34.0)
MCHC: 32.7 g/dL (ref 30.0–36.0)
MCV: 92.1 fL (ref 80.0–100.0)
Platelets: 388 10*3/uL (ref 150–400)
RBC: 4.82 MIL/uL (ref 4.22–5.81)
RDW: 12.8 % (ref 11.5–15.5)
WBC: 5.2 10*3/uL (ref 4.0–10.5)
nRBC: 0 % (ref 0.0–0.2)

## 2022-07-16 LAB — BASIC METABOLIC PANEL
Anion gap: 10 (ref 5–15)
BUN: 13 mg/dL (ref 8–23)
CO2: 23 mmol/L (ref 22–32)
Calcium: 9.4 mg/dL (ref 8.9–10.3)
Chloride: 104 mmol/L (ref 98–111)
Creatinine, Ser: 1.19 mg/dL (ref 0.61–1.24)
GFR, Estimated: >60 mL/min (ref >60)
Glucose, Bld: 94 mg/dL (ref 70–99)
Potassium: 4.8 mmol/L (ref 3.5–5.1)
Sodium: 137 mmol/L (ref 135–145)

## 2022-07-16 LAB — TROPONIN I (HIGH SENSITIVITY)
Troponin I (High Sensitivity): 9 ng/L (ref <18)
Troponin I (High Sensitivity): 10 ng/L (ref <18)

## 2022-07-16 NOTE — Consult Note (Signed)
Cardiology Consultation   Patient ID: [name] MRN: [id number]; DOB: 19-Oct-1941  Admit date: 07/16/2022 Date of Consult: 07/16/2022  PCP:  Kathyrn Lass, [place] [facility] Cardiologist:  Sinclair Grooms, MD  Electrophysiologist:  Vickie Epley, MD  {   Patient Profile:   [name] is a 81 y.o. male with a hx of hypertension, hyperlipidemia, and winky block heart block.  Who is being seen 07/16/2022 for the evaluation of continued winky block heart block at the request of the emergency room.Marland Kitchen  History of Present Illness:   [name] is an elderly gentleman patient of Dr. Tamala Julian.  He has at least mild to moderate dementia.  He has paroxysmal atrial fibrillation.  He has an event monitor.  The monitor company called the office today with information suggesting that the patient was having second-degree type II AV block.  The patient was sent to the emergency room for further evaluation.  The patient denies any symptoms.  He denies any syncope, presyncope.  His wife states that sometimes his stomach hurts when he gets hungry.  Man is very pleasantly demented.     Past Medical History:  Diagnosis Date   Arthritis    Attention deficit disorder    Blind left eye    Diabetes mellitus without complication ([place])    Hyperlipidemia    Hypertension     Past Surgical History:  Procedure Laterality Date   EYE SURGERY Left    x3   KNEE ARTHROSCOPY Bilateral 03/29/2014   Procedure: RIGHT KNEE ARTHROSCOPY AND DEBRIDEMENT, LEFT KNEE ASPIRATION,  LEFT KNEE ARTHROSCOPY AND DEBRIDEMENT.;  Surgeon: Meredith Pel, MD;  Location: [place];  Service: Orthopedics;  Laterality: Bilateral;  RIGHT KNEE ARTHROSCOPY AND DEBRIDEMENT, LEFT KNEE ASPIRATION,  LEFT KNEE ARTHROSCOPY AND DEBRIDEMENT.   KNEE ARTHROTOMY Bilateral 04/26/2014   Procedure: KNEE ARTHROTOMY, SYNOVECTOMY, ANTIBIOTIC BEAD PLACEMENT.;  Surgeon: Meredith Pel, MD;  Location: [place];  Service:  Orthopedics;  Laterality: Bilateral;   lipoma removal, right upper back     TONSILLECTOMY     TOTAL HIP ARTHROPLASTY Left 09/14/2019   TOTAL HIP ARTHROPLASTY Left 09/14/2019   Procedure: LEFT TOTAL HIP ARTHROPLASTY ANTERIOR APPROACH;  Surgeon: Meredith Pel, MD;  Location: [place];  Service: Orthopedics;  Laterality: Left;     Home Medications:  Prior to Admission medications   Medication Sig Start Date End Date Taking? Authorizing Provider  aspirin EC 81 MG tablet Take 1 tablet (81 mg total) by mouth daily. Swallow whole. 06/21/22   Belva Crome, MD  magnesium oxide (MAG-OX) 400 (240 Mg) MG tablet Take 0.5 tablets (200 mg total) by mouth 2 (two) times daily. 07/07/22   Regalado, Belkys A, MD  mirtazapine (REMERON) 15 MG tablet Take 15 mg by mouth at bedtime. 05/17/22   [provider]  omeprazole (PRILOSEC) 20 MG capsule Take 20 mg by mouth 2 (two) times daily before a meal. 04/19/22   [provider]  QUEtiapine (SEROQUEL) 25 MG tablet Take 1 tablet (25 mg total) by mouth at bedtime. 07/07/22   Regalado, Cassie Freer, MD    Inpatient Medications: Scheduled Meds:  Continuous Infusions:  PRN Meds:   Allergies:   No Known Allergies  Social History:   Social History   Socioeconomic History   Marital status: Married    Spouse name: Not on file   Number of children: Not on file   Years of education: Not on file  Highest education level: Not on file  Occupational History   Not on file  Tobacco Use   Smoking status: Former   Smokeless tobacco: Never  Vaping Use   Vaping Use: Never used  Substance and Sexual Activity   Alcohol use: Yes    Comment: occasional    Drug use: No   Sexual activity: Not on file  Other Topics Concern   Not on file  Social History Narrative   Not on file   Social Determinants of Health   Financial Resource Strain: Not on file  Food Insecurity: Not on file  Transportation Needs: Not on file  Physical Activity: Not on file   Stress: Not on file  Social Connections: Not on file  Intimate Partner Violence: Not on file    Family History:    Family History  Problem Relation Age of Onset   Heart attack Father    Heart attack Sister    Heart attack Brother      ROS:  Please see the history of present illness.   All other ROS reviewed and negative.     Physical Exam/Data:   Vitals:   07/16/22 1314 07/16/22 1431 07/16/22 1830 07/16/22 1830  BP: 131/75 129/72 137/75   Pulse: 79 74 69   Resp: '18 15 16   '$ Temp: 98.4 F (36.9 C) 98.2 F (36.8 C)  98.2 F (36.8 C)  TempSrc: Oral Oral  Oral  SpO2: 98% 99% 100%   Weight:      Height:       No intake or output data in the 24 hours ending 07/16/22 1855    07/16/2022    1:11 PM 07/03/2022    1:33 AM 06/21/2022    2:57 PM  Last 3 Weights  Weight (lbs) 157 lb 157 lb 153 lb 9.6 oz  Weight (kg) 71.215 kg 71.215 kg 69.673 kg     Body mass index is 23.87 kg/m.  General:  thin, elderly male,  in no acute distress HEENT: normal Neck: no JVD Vascular: No carotid bruits; Distal pulses 2+ bilaterally Cardiac:  normal S1, S2; RRR; no murmur  Lungs:  clear to auscultation bilaterally, no wheezing, rhonchi or rales  Abd: soft, nontender, no hepatomegaly  Ext: no edema Musculoskeletal:  No deformities, BUE and BLE strength normal and equal Skin: warm and dry  Neuro:  CNs 2-12 intact, no focal abnormalities noted Psych:  Normal affect   EKG:  The EKG was personally reviewed and demonstrates:   Normal sinus rhythm.  Occasional winky block heart block. Telemetry:  Telemetry was personally reviewed and demonstrates:  Normal sinus rhythm.  Occasional second-degree type I [name]) heart block.  Relevant CV Studies:   Laboratory Data:  High Sensitivity Troponin:   Recent Labs  Lab 07/03/22 0304 07/03/22 0939 07/16/22 1401 07/16/22 1645  TROPONINIHS 26* 23* 9 10     Chemistry Recent Labs  Lab 07/16/22 1401  NA 137  K 4.8  CL 104  CO2 23   GLUCOSE 94  BUN 13  CREATININE 1.19  CALCIUM 9.4  GFRNONAA >60  ANIONGAP 10    No results for input(s): "PROT", "ALBUMIN", "AST", "ALT", "ALKPHOS", "BILITOT" in the last 168 hours. Lipids No results for input(s): "CHOL", "TRIG", "HDL", "LABVLDL", "LDLCALC", "CHOLHDL" in the last 168 hours.  Hematology Recent Labs  Lab 07/16/22 1401  WBC 5.2  RBC 4.82  HGB 14.5  HCT 44.4  MCV 92.1  MCH 30.1  MCHC 32.7  RDW 12.8  PLT  388   Thyroid No results for input(s): "TSH", "FREET4" in the last 168 hours.  BNPNo results for input(s): "BNP", "PROBNP" in the last 168 hours.  DDimer No results for input(s): "DDIMER" in the last 168 hours.   Radiology/Studies:  DG Chest 2 View  Result Date: 07/16/2022 CLINICAL DATA:  Chest pain EXAM: CHEST - 2 VIEW COMPARISON:  10/12/2019 FINDINGS: The heart size and mediastinal contours are within normal limits. Both lungs are clear. The visualized skeletal structures are unremarkable. IMPRESSION: No active cardiopulmonary disease. Electronically Signed   By: Davina Poke D.O.   On: 07/16/2022 14:48     Assessment and Plan:   second-degree type I AV block ( w[place]back heart block ) the patient was hospitalized 2 weeks ago with episodes of heart block.  He has been seen by electrophysiology.  It was determined at that time that he had winky block heart block and an event monitor was placed on him. The monitor company called the office today and was concerned about second-degree type II AV block.  The patient denies any symptoms.  He has not had any episodes of syncope or presyncope.  EKG revealed normal sinus rhythm.  Close evaluation of his telemetry strips confirms that this is actually a second-degree type I AV block.  He is only having rare episodes.  He is completely asymptomatic.  He is not on any AV nodal blocking drugs.  The patient has been seen by electrophysiology and it has been determined that he is very stable.  Nothing is changed since  his previous admission 2 weeks ago.  We will have him follow-up to see Dr. Tamala Julian or an APP in the next several weeks.   The patient will be discharged from the ER  He is stable I have discussed with his wife and she agrees with our decision .    Risk Assessment/Risk Scores:  {   CHA2DS2-VASc Score = 5   This indicates a 7.2% annual risk of stroke. The patient's score is based upon: CHF History: 0 HTN History: 1 Diabetes History: 1 Stroke History: 0 Vascular Disease History: 1 Age Score: 2 Gender Score: 0         For questions or updates, please contact [place] Please consult [email or web address] for contact info under    Signed, Mertie Moores, MD  07/16/2022 6:55 PM

## 2022-07-16 NOTE — Telephone Encounter (Signed)
[name] from [employer] with results

## 2022-07-16 NOTE — Telephone Encounter (Signed)
Spoke with patient's spouse Hoyle Sauer regarding Palliative Care referral. She requested a call back tomorrow because she was taking him to [facility] for testing.

## 2022-07-16 NOTE — Telephone Encounter (Signed)
   Cardiac Monitor Alert  Date of alert:  07/16/2022   Patient Name: [name]  DOB: Dec 02, 1940  MRN: [id number]   [place] Cardiologist: Sinclair Grooms, MD  [place] EP:  Vickie Epley, MD    Monitor Information: Cardiac Event Monitor [[employer]]  Reason:  PAF Ordering provider:  Dr. Daneen Schick    Alert 2nd degree AV Block, Type II This is the 3rd alert for this rhythm.   Next Cardiology Appointment Recall for Jan 2024 with Dr. Daneen Schick  The patient was contacted today.  He is asymptomatic.  Per spouse pt has dementia can not speak with me.  Reports pt was on the way to Dr appointment at time of event.  Arrhythmia, symptoms and history reviewed with Dr. Radford Pax, DOD.   Plan:  ED evaluation    Other: Sherron Monday pt spouse advised of MD recommendation.  Advised to tell ED that MD referred pt d/t critical heart monitor report.   Precious Gilding, RN  07/16/2022 10:47 AM

## 2022-07-16 NOTE — ED Provider Notes (Signed)
[facility] EMERGENCY DEPARTMENT Provider Note   CSN: [id number] Arrival date & time: 07/16/22  1307     History  Chief Complaint  Patient presents with   AICD Problem    [name] is a 81 y.o. male.  With a history of dementia who presents to the ED for evaluation of 3 episodes of second-degree type II heart block from his event recorder.  Patient was admitted on 07/03/2022 for evaluation of heart block.  Cardiology was consulted during this admission, since patient is asymptomatic and has no plans for PPM.  Patient was on his way with his wife when his wife was called and given results of event recorder and told to present to the ED for further evaluation.  Patient is demented, admits that he does not remember much of each day.  Patient states that he has had bouts of abdominal pain states yesterday may have been the last time he had abdominal pain but does not remember for sure.  States that last for seconds but constantly.  Denies chest pain, shortness of breath, dizziness, lightheadedness, nausea, vomiting, weakness.  Wife is present at the time of evaluation and states that patient has not complained of anything recently.  HPI     Home Medications Prior to Admission medications   Medication Sig Start Date End Date Taking? Authorizing Provider  aspirin EC 81 MG tablet Take 1 tablet (81 mg total) by mouth daily. Swallow whole. 06/21/22   Belva Crome, MD  magnesium oxide (MAG-OX) 400 (240 Mg) MG tablet Take 0.5 tablets (200 mg total) by mouth 2 (two) times daily. 07/07/22   Regalado, Belkys A, MD  mirtazapine (REMERON) 15 MG tablet Take 15 mg by mouth at bedtime. 05/17/22   [provider]  omeprazole (PRILOSEC) 20 MG capsule Take 20 mg by mouth 2 (two) times daily before a meal. 04/19/22   [provider]  QUEtiapine (SEROQUEL) 25 MG tablet Take 1 tablet (25 mg total) by mouth at bedtime. 07/07/22   Regalado, Cassie Freer, MD      Allergies     Patient has no known allergies.    Review of Systems   Review of Systems  Constitutional:  Negative for fatigue and fever.  Respiratory:  Negative for cough and shortness of breath.   Cardiovascular:  Negative for chest pain, palpitations and leg swelling.  Gastrointestinal:  Negative for abdominal pain, nausea and vomiting.  Neurological:  Negative for dizziness, syncope, weakness and light-headedness.    Physical Exam Updated Vital Signs BP 129/72 (BP Location: Left Arm)   Pulse 74   Temp 98.2 F (36.8 C) (Oral)   Resp 15   Ht '5\' 8"'$  (1.727 m)   Wt 71.2 kg   SpO2 99%   BMI 23.87 kg/m  Physical Exam Vitals and nursing note reviewed.  Constitutional:      General: He is not in acute distress.    Appearance: Normal appearance. He is well-developed. He is not ill-appearing.  HENT:     Head: Normocephalic and atraumatic.  Eyes:     Conjunctiva/sclera: Conjunctivae normal.  Cardiovascular:     Rate and Rhythm: Normal rate and regular rhythm.     Pulses: Normal pulses.          Radial pulses are 2+ on the right side and 2+ on the left side.       Dorsalis pedis pulses are 2+ on the right side and 2+ on the left side.  Heart sounds: Normal heart sounds. No murmur heard. Pulmonary:     Effort: Pulmonary effort is normal. No respiratory distress.     Breath sounds: Normal breath sounds.  Abdominal:     General: Abdomen is flat.     Palpations: Abdomen is soft.     Tenderness: There is no abdominal tenderness.  Musculoskeletal:        General: No swelling.     Cervical back: Neck supple.     Right lower leg: No edema.     Left lower leg: No edema.  Skin:    General: Skin is warm and dry.     Capillary Refill: Capillary refill takes less than 2 seconds.  Neurological:     Mental Status: He is alert. Mental status is at baseline.     Comments: Demented with severe memory deficits  Psychiatric:        Mood and Affect: Mood normal.     ED Results / Procedures /  Treatments   Labs (all labs ordered are listed, but only abnormal results are displayed) Labs Reviewed  BASIC METABOLIC PANEL  CBC  TROPONIN I (HIGH SENSITIVITY)  TROPONIN I (HIGH SENSITIVITY)    EKG EKG Interpretation  Date/Time:  Tuesday July 16 2022 13:29:24 EDT Ventricular Rate:  72 PR Interval:  184 QRS Duration: 82 QT Interval:  372 QTC Calculation: 407 R Axis:   51 Text Interpretation: Sinus rhythm with marked sinus arrhythmia Otherwise normal ECG When compared with ECG of 03-Jul-2022 04:00, PREVIOUS ECG IS PRESENT Confirmed by Noemi Chapel [phone]) on 07/16/2022 5:28:40 PM  Radiology DG Chest 2 View  Result Date: 07/16/2022 CLINICAL DATA:  Chest pain EXAM: CHEST - 2 VIEW COMPARISON:  10/12/2019 FINDINGS: The heart size and mediastinal contours are within normal limits. Both lungs are clear. The visualized skeletal structures are unremarkable. IMPRESSION: No active cardiopulmonary disease. Electronically Signed   By: Davina Poke D.O.   On: 07/16/2022 14:48    Procedures Procedures    Medications Ordered in ED Medications - No data to display  ED Course/ Medical Decision Making/ A&P                           Medical Decision Making This patient presents to the ED for concern of event recorder showing secondary type II heart block, this involves an extensive number of treatment options, and is a complaint that carries with it a high risk of complications and morbidity.  The differential diagnosis includes first, second degree troponin secondary to type II, or third degree heart block, ACS, PE   Co morbidities that complicate the patient evaluation  Dementia  My initial workup includes  Additional history obtained from: Nursing notes from this visit. Prior ED visit on 07/03/2022 Previous records within EMR system shows admission on 07/03/2022 for heart block.  Cards at that time started no need for PPM. Family wife is present and provides a portion of the  history  Cardiac Monitoring:  The patient was maintained on a cardiac monitor.  I personally viewed and interpreted the cardiac monitored which showed an underlying rhythm of: Sinus arrhythmia with secondary to type I heart block Multiple EKGs taken showing sinus arrhythmia and second-degree type I heart block.  Consultations Obtained:  Dr. Sabra Heck requested consultation with the cardiology,  and discussed lab and imaging findings as well as pertinent plan - they recommend: Outpatient monitoring.  See Dr. Ammie Ferrier attestation for more information.  Afebrile,  hemodynamically stable.  Patient states he has been asymptomatic since discharge from hospital.  History limited due to patient's dementia.  Wife is present and states that patient has not complained of anything since discharge.  Cardiology reports patient is most often in second-degree type I.  Stable at the time of discharge.  At this time there does not appear to be any evidence of an acute emergency medical condition and the patient appears stable for discharge with appropriate outpatient follow up. Diagnosis was discussed with patient who verbalizes understanding of care plan and is agreeable to discharge. I have discussed return precautions with patient and wife who verbalizes understanding. Patient encouraged to follow-up with their PCP within 1 week. All questions answered.  Patient's case discussed with Dr. Sabra Heck who agrees with plan to discharge with follow-up.   Note: Portions of this report may have been transcribed using voice recognition software. Every effort was made to ensure accuracy; however, inadvertent computerized transcription errors may still be present.          Final Clinical Impression(s) / ED Diagnoses Final diagnoses:  None    Rx / DC Orders ED Discharge Orders     None         Nehemiah Massed 07/16/22 2024    Noemi Chapel, MD 07/16/22 [phone]

## 2022-07-16 NOTE — ED Provider Notes (Signed)
Medical screening examination/treatment/procedure(s) were conducted as a shared visit with non-physician practitioner(s) and myself.  I personally evaluated the patient during the encounter.  Clinical Impression:   Final diagnoses:  AV block, Mobitz 1    This patient is presenting after having possible heart block prehospital.  He was recently admitted and had a secondary heart block, he is not in a heart block clinically at this time.  His EKG from today is unremarkable, I spoke with Dr. Acie Fredrickson of the cardiology service who will see the patient at the bedside to help further make decisions about what comes next for this patient.  Again the patient is in no distress, vitals are unremarkable and reassuring  Appreciate Dr. Acie Fredrickson for his willingness to evaluate and assist with decision  making about next steps for this patient  Cardiology has seen the EKG, states it is Darden Amber, this patient is stable, asymptomatic, they recommend follow-up in the office and we will have the office contact them for follow-up. Vitals:   07/16/22 1314 07/16/22 1431 07/16/22 1830 07/16/22 1830  BP: 131/75 129/72 137/75   Pulse: 79 74 69   Resp: '18 15 16   '$ Temp: 98.4 F (36.9 C) 98.2 F (36.8 C)  98.2 F (36.8 C)  TempSrc: Oral Oral  Oral  SpO2: 98% 99% 100%   Weight:      Height:           Noemi Chapel, MD 07/16/22 1850

## 2022-07-16 NOTE — ED Provider Triage Note (Signed)
Emergency Medicine Provider Triage Evaluation Note  [name] , a 81 y.o. male  was evaluated in triage.  Patient presents with his wife due to cardiology notification that he had abnormal heart rhythms on his AICD.  Patient has dementia but wife reports he has not complained of any pain, palpitations or shortness of breath or dizziness.  Review of Systems  Level 5 caveat Physical Exam  BP 131/75 (BP Location: Left Arm)   Pulse 79   Temp 98.4 F (36.9 C) (Oral)   Resp 18   Ht '5\' 8"'$  (1.727 m)   Wt 71.2 kg   SpO2 98%   BMI 23.87 kg/m  Gen:   Awake, no distress   Resp:  Normal effort  MSK:   Moves extremities without difficulty  Other:  RRR, lung sounds clear  Medical Decision Making  Medically screening exam initiated at 2:09 PM.  Appropriate orders placed.  GUMECINDO HOPKIN was informed that the remainder of the evaluation will be completed by another provider, this initial triage assessment does not replace that evaluation, and the importance of remaining in the ED until their evaluation is complete.  Per chart review patient follows with Dr. Daneen Schick with cardiology.  Their office called to notify the patient that he was in a second-degree heart block and needed to come to the emergency department.   Rhae Hammock, PA-C 07/16/22 1411

## 2022-07-16 NOTE — Discharge Instructions (Signed)
The cardiology office will call to follow-up with you and make a follow-up appointment.  I have spoken with the cardiologist, he has evaluated your EKG, thankfully there is nothing else that you need to do today.  If you have any worsening symptoms such as losing consciousness, severe shortness of breath weakness or any other concerning findings please come back to the ER immediately.

## 2022-07-16 NOTE — ED Triage Notes (Signed)
Patient has dementia, patient was notified by MD due to receiving an alert patient has had 3 alerts for 2nd degree type 2 heart block.  Patient is asymptomatic.

## 2022-07-16 NOTE — ED Notes (Signed)
Dr. Sabra Heck made aware of patient's heart rate. MD instructed this RN to check BP. BP checked. MD states that patient is okay for discharge.

## 2022-07-17 ENCOUNTER — Telehealth: Payer: Self-pay | Admitting: Interventional Cardiology

## 2022-07-17 DIAGNOSIS — M1990 Unspecified osteoarthritis, unspecified site: Secondary | ICD-10-CM | POA: Diagnosis not present

## 2022-07-17 DIAGNOSIS — E785 Hyperlipidemia, unspecified: Secondary | ICD-10-CM | POA: Diagnosis not present

## 2022-07-17 DIAGNOSIS — I10 Essential (primary) hypertension: Secondary | ICD-10-CM | POA: Diagnosis not present

## 2022-07-17 DIAGNOSIS — E119 Type 2 diabetes mellitus without complications: Secondary | ICD-10-CM | POA: Diagnosis not present

## 2022-07-17 DIAGNOSIS — I480 Paroxysmal atrial fibrillation: Secondary | ICD-10-CM | POA: Diagnosis not present

## 2022-07-17 DIAGNOSIS — I442 Atrioventricular block, complete: Secondary | ICD-10-CM | POA: Diagnosis not present

## 2022-07-17 NOTE — Telephone Encounter (Signed)
   Cardiac Monitor Alert  Date of alert:  07/17/2022   Patient Name: [name]  DOB: 02-05-1941  MRN: [id number]   [place] Cardiologist: Sinclair Grooms, MD  [facility] EP:  Vickie Epley, MD    Monitor Information: Cardiac Event Monitor [[employer]]  Reason:  2nd Degree AVB Type 2 ( #4) Ordering provider:  Dr. Linard Millers III   Alert 2nd degree AV Block, Type II This is the 4th alert for this rhythm.   Follow up being scheduled through ER / Cardiology consult yesterday: 07/16/2022.  The patient was contacted today.  He is asymptomatic. Arrhythmia, symptoms and history reviewed with Dr. Daneen Schick III.   Plan:  Dr. Daneen Schick III was made aware of recent ER visit 07/16/2022, and on 07/03/2022 for 2nd degree AV Block, Type II.  Pt is asymptomatic with these episodes. Wife, Mrs. Schleyer contacted and she stated [name] was working with at home therapist when heart block occurred.  ( Dr. Acie Fredrickson evaluated the patient while in the ER seeing Noemi Chapel, MD ER provider, and follow up plan of care is being established per note. )     Other: NOTE: [employer] contacted me at [facility] Triage on 07/17/22 at 257pm to let us know Pt had 24 second 2nd degree AV Block, Type II, and came out of the heart block with an increased HR of 51 BPM.  Call per [facility];     Varney Daily, RN  07/17/2022 3:37 PM

## 2022-07-17 NOTE — Telephone Encounter (Signed)
  [employer] calling to give critical result

## 2022-07-18 ENCOUNTER — Telehealth: Payer: Self-pay

## 2022-07-18 NOTE — Telephone Encounter (Signed)
   Cardiac Monitor Alert  Date of alert:  07/18/2022   Patient Name: [name]  DOB: 1941-06-06  MRN: [id number]   [place] Cardiologist: Sinclair Grooms, MD  [facility] EP:  Vickie Epley, MD    Monitor Information: Cardiac Event Monitor [[employer]]  Reason:  Looking for A. FIB burden to determine patient anticoagulation medication Ordering provider:  Dr. Tamala Julian   Alert 2nd degree AV Block, Type II This is the 2nd alert for this rhythm. 2nd Degree AVB Type 2 bpm 26 on 07/17/22 at 4:11 am.  Next Cardiology Appointment   Date:  Recall in for February 2024  Provider:  Dr. Tamala Julian  The patient was contacted today.  He is asymptomatic. Patient was sleeping at the time of report.  He reports the following symptoms:  None. Arrhythmia, symptoms and history reviewed with Dr. Gasper Sells.   Plan:  No Changes.    Ewell Poe [place], RN  07/18/2022 3:34 PM

## 2022-07-18 NOTE — Telephone Encounter (Signed)
Spoke with patient's spouse Hoyle Sauer regarding Palliative Care services. She declined services. Will cancel referral and notify referring provider.

## 2022-07-22 DIAGNOSIS — I10 Essential (primary) hypertension: Secondary | ICD-10-CM | POA: Diagnosis not present

## 2022-07-22 DIAGNOSIS — I442 Atrioventricular block, complete: Secondary | ICD-10-CM | POA: Diagnosis not present

## 2022-07-22 DIAGNOSIS — E119 Type 2 diabetes mellitus without complications: Secondary | ICD-10-CM | POA: Diagnosis not present

## 2022-07-22 DIAGNOSIS — I480 Paroxysmal atrial fibrillation: Secondary | ICD-10-CM | POA: Diagnosis not present

## 2022-07-22 DIAGNOSIS — E785 Hyperlipidemia, unspecified: Secondary | ICD-10-CM | POA: Diagnosis not present

## 2022-07-22 DIAGNOSIS — M1990 Unspecified osteoarthritis, unspecified site: Secondary | ICD-10-CM | POA: Diagnosis not present

## 2022-07-23 ENCOUNTER — Telehealth: Payer: Self-pay

## 2022-07-23 DIAGNOSIS — M1990 Unspecified osteoarthritis, unspecified site: Secondary | ICD-10-CM | POA: Diagnosis not present

## 2022-07-23 DIAGNOSIS — I442 Atrioventricular block, complete: Secondary | ICD-10-CM | POA: Diagnosis not present

## 2022-07-23 DIAGNOSIS — I10 Essential (primary) hypertension: Secondary | ICD-10-CM | POA: Diagnosis not present

## 2022-07-23 DIAGNOSIS — I480 Paroxysmal atrial fibrillation: Secondary | ICD-10-CM | POA: Diagnosis not present

## 2022-07-23 DIAGNOSIS — E119 Type 2 diabetes mellitus without complications: Secondary | ICD-10-CM | POA: Diagnosis not present

## 2022-07-23 DIAGNOSIS — E785 Hyperlipidemia, unspecified: Secondary | ICD-10-CM | POA: Diagnosis not present

## 2022-07-23 NOTE — Telephone Encounter (Addendum)
   Cardiac Monitor Alert  Date of alert:  07/19/2022  Patient Name: [name]  DOB: Feb 25, 1941  MRN: [id number]   [place] Cardiologist: Sinclair Grooms, MD  [facility] EP:  Vickie Epley, MD    Monitor Information: Cardiac Event Monitor [[employer]]  Reason:  Assess Afib Burden  Ordering provider:  Daneen Schick, MD   Alert 2nd degree AV Block, Type II This is the  6th  alert for this rhythm.   Next Cardiology Appointment   Date: Recall 11/2022 Provider:  Daneen Schick, MD  The patient could NOT be reached by telephone today.  Left a message to call back to discuss monitor alert from 07/19/22 at 11:31:27 PM CST Arrhythmia, symptoms and history reviewed with Dr. Daneen Schick.   Plan:  Continue to monitor.    Other:   Precious Gilding, RN  07/23/2022 3:51 PM

## 2022-07-23 NOTE — Telephone Encounter (Signed)
Spoke with pt spouse OK per DPR.  Reports pt has dementia and she can not recall pt c/o pain or discomfort on 07/19/22 around 11:31 pm.   Spouse wants to know why pt is having CT on 08/09/22.  She is under the impression that pt will not have watchman.  Advised pt will route to Dr. Quentin Ore and nurse to advise.

## 2022-07-24 DIAGNOSIS — E119 Type 2 diabetes mellitus without complications: Secondary | ICD-10-CM | POA: Diagnosis not present

## 2022-07-24 DIAGNOSIS — I10 Essential (primary) hypertension: Secondary | ICD-10-CM | POA: Diagnosis not present

## 2022-07-24 DIAGNOSIS — E785 Hyperlipidemia, unspecified: Secondary | ICD-10-CM | POA: Diagnosis not present

## 2022-07-24 DIAGNOSIS — I480 Paroxysmal atrial fibrillation: Secondary | ICD-10-CM | POA: Diagnosis not present

## 2022-07-24 DIAGNOSIS — M1990 Unspecified osteoarthritis, unspecified site: Secondary | ICD-10-CM | POA: Diagnosis not present

## 2022-07-24 DIAGNOSIS — I442 Atrioventricular block, complete: Secondary | ICD-10-CM | POA: Diagnosis not present

## 2022-07-24 NOTE — Telephone Encounter (Signed)
The Watchman Team had been following the patient until he and his wife declined implant at Dr. Thompson Caul visit on 06/21/22.  The CT was an old order that was not cancelled. As far as Watchman, the patient does not need a CT since he is not undergoing the procedure.  He does need follow-up for abnormal monitor tracings.

## 2022-07-24 NOTE — Telephone Encounter (Signed)
From Dr. Wylie Hail note: The patient's wife has decided against consenting him for Watchman procedure despite our recommendations.

## 2022-07-24 NOTE — Telephone Encounter (Signed)
Notified CT has been canceled. Made appointment.  Verbalized understanding and agreement.

## 2022-07-25 ENCOUNTER — Telehealth: Payer: Self-pay | Admitting: Internal Medicine

## 2022-07-25 NOTE — Telephone Encounter (Signed)
I was notified about episodes of first-degree AV block and then second-degree type II?  Around 1 AM.  Multiple similar alerts in the past. 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 Unable to reach the patient. We will sign off as a day team

## 2022-07-25 NOTE — Telephone Encounter (Incomplete)
   Cardiac Monitor Alert  Date of alert:  07/25/2022   Patient Name: [name]  DOB: 28-Jul-1941  MRN: [id number]   [place] Cardiologist: Sinclair Grooms, MD  [place] EP:  Vickie Epley, MD    Monitor Information: Cardiac Event Monitor [[employer]]  Reason:  Atrial Fibrillation buren  Ordering provider:  Dr. Tamala Julian  { Tips for Triage    No need to delete red or blue text  It will delete when note signed  Atrial fib/flutter? Review previous note for plan of care. Confirm pt on anticoagulation.  Call patient to check for symptoms.  Confirm next appointment.  Ask pt if willing to go to afib clinic?   Other Rhythms? Follow guidelines for alerts. Call patient to assess symptoms   Complete same day.  If the patient does not answer >> RN to locate as much info as possible from chart review and discuss with DOD no later than 3pm.   Alerts / Repeat Alerts? If 1st alert >> follow original protocol  If 2nd alert >> follow original protocol   If 3rd or greater alert >> call patient to ensure nothing has changed  If asymptomatic send message to ordering provider to determine if alerts for that rhythm should stop >> if ordering provider requests alerts to stop >> notify company rep  If symptomatic >> in person conversation w/ DOD/Rx Provider  RN should upload strip to Epic at end of workflow  Scan to monitor in chart :1}  Alert 2nd degree AV Block, Type I 2nd degree AV Block, Type II This is the  7  alert for this rhythm. (1 am)   Next Cardiology Appointment { Click here to go to Appt Desk  :1}  Date:  12/02/21  Provider:  8:45 am  The patient could NOT be reached by telephone today.  Left message to call back.  Arrhythmia, symptoms and history reviewed with ***.   Plan:  ***   Other: Dr. Tamala Julian requested an appt be made with him in December if the patient continues to be asymptomatic.   Darrell Jewel, RN  07/25/2022 8:16 AM

## 2022-07-26 ENCOUNTER — Telehealth: Payer: Self-pay

## 2022-07-26 NOTE — Telephone Encounter (Signed)
   Cardiac Monitor Alert  Date of alert:  07/21/22   Patient Name: [name]  DOB: 04/21/1941  MRN: [id number]   [place] Cardiologist: Sinclair Grooms, MD  [facility] EP:  Vickie Epley, MD    Monitor Information: Cardiac Event Monitor [[employer]]  Reason:  A. FIB burden to see if patient needs to start anticoagulation medication Ordering provider:  Dr. Tamala Julian   Alert 2nd degree AV Block, Type I 2nd degree AV Block, Type II This is the  not the first  alert for this rhythm.  Received 3 alerts from this week 1-07/21/22 8:35 am ET- 2nd degree AVB Type 2, 2nd Degree AVB Type 1 bpm 36 2-07/25/22 11:40:09 pm ET- 2nd degree AVB Type 2, bpm 29 3-07/25/22 11:40:30 pm ET-2nd degree AVB Type 2, 2nd degree AVD Type 1, Sinus Rhythm with 1st Degree AV Block bmp 49  Next Cardiology Appointment   Date:  12/02/22  Provider:  Dr. Quentin Ore  The patient was contacted today.  He is asymptomatic. Patient sleeping. Arrhythmia, symptoms and history reviewed with Dr. Angelena Form Plan:  Follow-up with Dr. Tamala Julian, patient scheduled for 10/28/22 8:40 am  Michaelyn Barter, RN  07/26/2022 8:34 AM

## 2022-07-29 DIAGNOSIS — Z0000 Encounter for general adult medical examination without abnormal findings: Secondary | ICD-10-CM | POA: Diagnosis not present

## 2022-07-29 DIAGNOSIS — Z1389 Encounter for screening for other disorder: Secondary | ICD-10-CM | POA: Diagnosis not present

## 2022-07-29 DIAGNOSIS — Z23 Encounter for immunization: Secondary | ICD-10-CM | POA: Diagnosis not present

## 2022-07-31 ENCOUNTER — Telehealth: Payer: Self-pay | Admitting: *Deleted

## 2022-07-31 DIAGNOSIS — I480 Paroxysmal atrial fibrillation: Secondary | ICD-10-CM | POA: Diagnosis not present

## 2022-07-31 DIAGNOSIS — I442 Atrioventricular block, complete: Secondary | ICD-10-CM | POA: Diagnosis not present

## 2022-07-31 DIAGNOSIS — E785 Hyperlipidemia, unspecified: Secondary | ICD-10-CM | POA: Diagnosis not present

## 2022-07-31 DIAGNOSIS — E119 Type 2 diabetes mellitus without complications: Secondary | ICD-10-CM | POA: Diagnosis not present

## 2022-07-31 DIAGNOSIS — I10 Essential (primary) hypertension: Secondary | ICD-10-CM | POA: Diagnosis not present

## 2022-07-31 DIAGNOSIS — M1990 Unspecified osteoarthritis, unspecified site: Secondary | ICD-10-CM | POA: Diagnosis not present

## 2022-07-31 NOTE — Telephone Encounter (Signed)
   Cardiac Monitor Alert  Date of alert:  07/31/2022   Patient Name: [name]  DOB: [birth year]  MRN: [id number]   [place] Cardiologist: Sinclair Grooms, MD  [place] EP:  Vickie Epley, MD    Monitor Information: Cardiac Event Monitor [[employer]]  Reason:  Critical report 07/27/22 3:40 am 2AVB HR 30 Ordering provider:  Tamala Julian   Alert 2nd degree AV Block, Type II This is the 2nd alert for this rhythm.   Next Cardiology Appointment   Date:  10/28/22  Provider:  Tamala Julian  The patient was contacted today.  He is asymptomatic. Arrhythmia, symptoms and history reviewed with DOD, Dr. Gasper Sells.   Plan:   no orders received, continue plan of care  Other:   Stanton Kidney, RN  07/31/2022 9:14 AM

## 2022-08-02 DIAGNOSIS — I10 Essential (primary) hypertension: Secondary | ICD-10-CM | POA: Diagnosis not present

## 2022-08-02 DIAGNOSIS — I442 Atrioventricular block, complete: Secondary | ICD-10-CM | POA: Diagnosis not present

## 2022-08-02 DIAGNOSIS — I480 Paroxysmal atrial fibrillation: Secondary | ICD-10-CM | POA: Diagnosis not present

## 2022-08-02 DIAGNOSIS — M1990 Unspecified osteoarthritis, unspecified site: Secondary | ICD-10-CM | POA: Diagnosis not present

## 2022-08-02 DIAGNOSIS — E785 Hyperlipidemia, unspecified: Secondary | ICD-10-CM | POA: Diagnosis not present

## 2022-08-02 DIAGNOSIS — E119 Type 2 diabetes mellitus without complications: Secondary | ICD-10-CM | POA: Diagnosis not present

## 2022-08-07 DIAGNOSIS — I442 Atrioventricular block, complete: Secondary | ICD-10-CM | POA: Diagnosis not present

## 2022-08-07 DIAGNOSIS — N3000 Acute cystitis without hematuria: Secondary | ICD-10-CM | POA: Diagnosis not present

## 2022-08-07 DIAGNOSIS — Z7982 Long term (current) use of aspirin: Secondary | ICD-10-CM | POA: Diagnosis not present

## 2022-08-07 DIAGNOSIS — Z79899 Other long term (current) drug therapy: Secondary | ICD-10-CM | POA: Diagnosis not present

## 2022-08-07 DIAGNOSIS — Z96642 Presence of left artificial hip joint: Secondary | ICD-10-CM | POA: Diagnosis not present

## 2022-08-07 DIAGNOSIS — E119 Type 2 diabetes mellitus without complications: Secondary | ICD-10-CM | POA: Diagnosis not present

## 2022-08-07 DIAGNOSIS — I480 Paroxysmal atrial fibrillation: Secondary | ICD-10-CM | POA: Diagnosis not present

## 2022-08-07 DIAGNOSIS — Z87891 Personal history of nicotine dependence: Secondary | ICD-10-CM | POA: Diagnosis not present

## 2022-08-07 DIAGNOSIS — F0390 Unspecified dementia without behavioral disturbance: Secondary | ICD-10-CM | POA: Diagnosis not present

## 2022-08-07 DIAGNOSIS — E785 Hyperlipidemia, unspecified: Secondary | ICD-10-CM | POA: Diagnosis not present

## 2022-08-07 DIAGNOSIS — K219 Gastro-esophageal reflux disease without esophagitis: Secondary | ICD-10-CM | POA: Diagnosis not present

## 2022-08-07 DIAGNOSIS — M1990 Unspecified osteoarthritis, unspecified site: Secondary | ICD-10-CM | POA: Diagnosis not present

## 2022-08-07 DIAGNOSIS — H547 Unspecified visual loss: Secondary | ICD-10-CM | POA: Diagnosis not present

## 2022-08-07 DIAGNOSIS — I10 Essential (primary) hypertension: Secondary | ICD-10-CM | POA: Diagnosis not present

## 2022-08-09 ENCOUNTER — Other Ambulatory Visit (HOSPITAL_COMMUNITY): Payer: Medicare Other

## 2022-08-14 DIAGNOSIS — E785 Hyperlipidemia, unspecified: Secondary | ICD-10-CM | POA: Diagnosis not present

## 2022-08-14 DIAGNOSIS — M1990 Unspecified osteoarthritis, unspecified site: Secondary | ICD-10-CM | POA: Diagnosis not present

## 2022-08-14 DIAGNOSIS — I480 Paroxysmal atrial fibrillation: Secondary | ICD-10-CM | POA: Diagnosis not present

## 2022-08-14 DIAGNOSIS — I10 Essential (primary) hypertension: Secondary | ICD-10-CM | POA: Diagnosis not present

## 2022-08-14 DIAGNOSIS — I442 Atrioventricular block, complete: Secondary | ICD-10-CM | POA: Diagnosis not present

## 2022-08-14 DIAGNOSIS — E119 Type 2 diabetes mellitus without complications: Secondary | ICD-10-CM | POA: Diagnosis not present

## 2022-08-16 DIAGNOSIS — I10 Essential (primary) hypertension: Secondary | ICD-10-CM | POA: Diagnosis not present

## 2022-08-16 DIAGNOSIS — E119 Type 2 diabetes mellitus without complications: Secondary | ICD-10-CM | POA: Diagnosis not present

## 2022-08-16 DIAGNOSIS — I442 Atrioventricular block, complete: Secondary | ICD-10-CM | POA: Diagnosis not present

## 2022-08-16 DIAGNOSIS — M1990 Unspecified osteoarthritis, unspecified site: Secondary | ICD-10-CM | POA: Diagnosis not present

## 2022-08-16 DIAGNOSIS — I480 Paroxysmal atrial fibrillation: Secondary | ICD-10-CM | POA: Diagnosis not present

## 2022-08-16 DIAGNOSIS — E785 Hyperlipidemia, unspecified: Secondary | ICD-10-CM | POA: Diagnosis not present

## 2022-08-20 DIAGNOSIS — I442 Atrioventricular block, complete: Secondary | ICD-10-CM | POA: Diagnosis not present

## 2022-08-20 DIAGNOSIS — I10 Essential (primary) hypertension: Secondary | ICD-10-CM | POA: Diagnosis not present

## 2022-08-20 DIAGNOSIS — E785 Hyperlipidemia, unspecified: Secondary | ICD-10-CM | POA: Diagnosis not present

## 2022-08-20 DIAGNOSIS — I480 Paroxysmal atrial fibrillation: Secondary | ICD-10-CM | POA: Diagnosis not present

## 2022-08-20 DIAGNOSIS — E119 Type 2 diabetes mellitus without complications: Secondary | ICD-10-CM | POA: Diagnosis not present

## 2022-08-20 DIAGNOSIS — M1990 Unspecified osteoarthritis, unspecified site: Secondary | ICD-10-CM | POA: Diagnosis not present

## 2022-08-28 DIAGNOSIS — I480 Paroxysmal atrial fibrillation: Secondary | ICD-10-CM | POA: Diagnosis not present

## 2022-08-28 DIAGNOSIS — M1990 Unspecified osteoarthritis, unspecified site: Secondary | ICD-10-CM | POA: Diagnosis not present

## 2022-08-28 DIAGNOSIS — E119 Type 2 diabetes mellitus without complications: Secondary | ICD-10-CM | POA: Diagnosis not present

## 2022-08-28 DIAGNOSIS — E785 Hyperlipidemia, unspecified: Secondary | ICD-10-CM | POA: Diagnosis not present

## 2022-08-28 DIAGNOSIS — I442 Atrioventricular block, complete: Secondary | ICD-10-CM | POA: Diagnosis not present

## 2022-08-28 DIAGNOSIS — I10 Essential (primary) hypertension: Secondary | ICD-10-CM | POA: Diagnosis not present

## 2022-09-03 DIAGNOSIS — I442 Atrioventricular block, complete: Secondary | ICD-10-CM | POA: Diagnosis not present

## 2022-09-03 DIAGNOSIS — E785 Hyperlipidemia, unspecified: Secondary | ICD-10-CM | POA: Diagnosis not present

## 2022-09-03 DIAGNOSIS — I480 Paroxysmal atrial fibrillation: Secondary | ICD-10-CM | POA: Diagnosis not present

## 2022-09-03 DIAGNOSIS — M1990 Unspecified osteoarthritis, unspecified site: Secondary | ICD-10-CM | POA: Diagnosis not present

## 2022-09-03 DIAGNOSIS — E119 Type 2 diabetes mellitus without complications: Secondary | ICD-10-CM | POA: Diagnosis not present

## 2022-09-03 DIAGNOSIS — I10 Essential (primary) hypertension: Secondary | ICD-10-CM | POA: Diagnosis not present

## 2022-09-11 DIAGNOSIS — G301 Alzheimer's disease with late onset: Secondary | ICD-10-CM | POA: Diagnosis not present

## 2022-09-11 DIAGNOSIS — R634 Abnormal weight loss: Secondary | ICD-10-CM | POA: Diagnosis not present

## 2022-09-11 DIAGNOSIS — Z23 Encounter for immunization: Secondary | ICD-10-CM | POA: Diagnosis not present

## 2022-09-17 DIAGNOSIS — N390 Urinary tract infection, site not specified: Secondary | ICD-10-CM | POA: Diagnosis not present

## 2022-09-25 DIAGNOSIS — E1169 Type 2 diabetes mellitus with other specified complication: Secondary | ICD-10-CM | POA: Diagnosis not present

## 2022-10-27 NOTE — Progress Notes (Unsigned)
Cardiology Office Note:    Date:  10/28/2022   ID:  [name], DOB 11-Nov-1941, MRN [id number]  PCP:  Kathyrn Lass, MD  Cardiologist:  Sinclair Grooms, MD   Referring MD: Kathyrn Lass, MD   Chief Complaint  Patient presents with   Atrial Fibrillation   Hypertension    History of Present Illness:    [name] is a 81 y.o. male with a hx of PAF, DM II, hypertension, and attention deficit disorder, h/o PAC with block, nose bleeds on Eliquis and now back on aspirin per PCP.   Increasing difficulty with attention and memory.  Wife is concerned that he cannot gain weight.  No clinical instances of atrial fibrillation.  Anticoagulation therapy was discontinued because of excessive nosebleeding.  He was evaluated for Watchman but ultimately the procedure was not performed, mostly because the wife was concerned about risk.  Past Medical History:  Diagnosis Date   Arthritis    Attention deficit disorder    Blind left eye    Diabetes mellitus without complication ([place])    Hyperlipidemia    Hypertension     Past Surgical History:  Procedure Laterality Date   EYE SURGERY Left    x3   KNEE ARTHROSCOPY Bilateral 03/29/2014   Procedure: RIGHT KNEE ARTHROSCOPY AND DEBRIDEMENT, LEFT KNEE ASPIRATION,  LEFT KNEE ARTHROSCOPY AND DEBRIDEMENT.;  Surgeon: Meredith Pel, MD;  Location: [place];  Service: Orthopedics;  Laterality: Bilateral;  RIGHT KNEE ARTHROSCOPY AND DEBRIDEMENT, LEFT KNEE ASPIRATION,  LEFT KNEE ARTHROSCOPY AND DEBRIDEMENT.   KNEE ARTHROTOMY Bilateral 04/26/2014   Procedure: KNEE ARTHROTOMY, SYNOVECTOMY, ANTIBIOTIC BEAD PLACEMENT.;  Surgeon: Meredith Pel, MD;  Location: [place];  Service: Orthopedics;  Laterality: Bilateral;   lipoma removal, right upper back     TONSILLECTOMY     TOTAL HIP ARTHROPLASTY Left 09/14/2019   TOTAL HIP ARTHROPLASTY Left 09/14/2019   Procedure: LEFT TOTAL HIP ARTHROPLASTY ANTERIOR APPROACH;  Surgeon: Meredith Pel, MD;   Location: [place];  Service: Orthopedics;  Laterality: Left;    Current Medications: Current Meds  Medication Sig   aspirin EC 81 MG tablet Take 1 tablet (81 mg total) by mouth daily. Swallow whole.   donepezil (ARICEPT) 5 MG tablet Take 5 mg by mouth at bedtime.   magnesium oxide (MAG-OX) 400 (240 Mg) MG tablet Take 0.5 tablets (200 mg total) by mouth 2 (two) times daily.   mirtazapine (REMERON) 15 MG tablet Take 15 mg by mouth at bedtime.   omeprazole (PRILOSEC) 20 MG capsule Take 20 mg by mouth 2 (two) times daily before a meal.   QUEtiapine (SEROQUEL) 25 MG tablet Take 1 tablet (25 mg total) by mouth at bedtime.     Allergies:   Patient has no known allergies.   Social History   Socioeconomic History   Marital status: Married    Spouse name: Not on file   Number of children: Not on file   Years of education: Not on file   Highest education level: Not on file  Occupational History   Not on file  Tobacco Use   Smoking status: Former   Smokeless tobacco: Never  Vaping Use   Vaping Use: Never used  Substance and Sexual Activity   Alcohol use: Yes    Comment: occasional    Drug use: No   Sexual activity: Not on file  Other Topics Concern   Not on file  Social History Narrative   Not on file  Social Determinants of Health   Financial Resource Strain: Not on file  Food Insecurity: Not on file  Transportation Needs: Not on file  Physical Activity: Not on file  Stress: Not on file  Social Connections: Not on file     Family History: The patient's family history includes Heart attack in his brother, father, and sister.  ROS:   Please see the history of present illness.    No complaints other than weight being slightly less than what they considered optimal.  All other systems reviewed and are negative.  EKGs/Labs/Other Studies Reviewed:    The following studies were reviewed today: CARDIAC TELEMETRY 06/2022:   Basic rhythm is sinus   Nocturnal bradycardia with  second-degree AV block intermittently.   No reported symptoms  EKG:  EKG not performed today.  Recent Labs: 07/03/2022: TSH 0.655 07/05/2022: Magnesium 1.9 07/16/2022: BUN 13; Creatinine, Ser 1.19; Hemoglobin 14.5; Platelets 388; Potassium 4.8; Sodium 137  Recent Lipid Panel    Component Value Date/Time   CHOL 166 09/15/2019 0323   TRIG 33 09/15/2019 0323   HDL 78 09/15/2019 0323   CHOLHDL 2.1 09/15/2019 0323   VLDL 7 09/15/2019 0323   LDLCALC 81 09/15/2019 0323    Physical Exam:    VS:  BP (!) 140/70   Pulse (!) 51   Ht '5\' 8"'$  (1.727 m)   Wt 157 lb 3.2 oz (71.3 kg)   SpO2 98%   BMI 23.90 kg/m     Wt Readings from Last 3 Encounters:  10/28/22 157 lb 3.2 oz (71.3 kg)  07/16/22 157 lb (71.2 kg)  07/03/22 157 lb (71.2 kg)     GEN: Slow gait and movement. No acute distress HEENT: Normal NECK: No JVD. LYMPHATICS: No lymphadenopathy CARDIAC: No murmur. RRR no gallop, or edema. VASCULAR:  Normal Pulses. No bruits. RESPIRATORY:  Clear to auscultation without rales, wheezing or rhonchi  ABDOMEN: Soft, non-tender, non-distended, No pulsatile mass, MUSCULOSKELETAL: No deformity  SKIN: Warm and dry NEUROLOGIC:  Alert and oriented x 3 PSYCHIATRIC:  Normal affect   ASSESSMENT:    1. PAF (paroxysmal atrial fibrillation) ([place])   2. Chronic anticoagulation   3. Essential hypertension   4. Mixed hyperlipidemia   5. Epistaxis    PLAN:    In order of problems listed above:  Continue aspirin.  No obvious recurrent episodes of atrial fibrillation.  Did not receive Watchman for reasons that are unclear but mostly because the wife was concerned about risk. Anticoagulation was discontinued because of unacceptable recurrent epistaxis. Excellent current control.  Target 140/80 mmHg. Continue statin therapy. Resolved on a aspirin 81 mg/day.  Follow-up in 1 year with colleague Chandrashekar/[place].   Medication Adjustments/Labs and Tests Ordered: Current medicines are  reviewed at length with the patient today.  Concerns regarding medicines are outlined above.  No orders of the defined types were placed in this encounter.  No orders of the defined types were placed in this encounter.   Patient Instructions  Medication Instructions:  Your physician recommends that you continue on your current medications as directed. Please refer to the Current Medication list given to you today.  *If you need a refill on your cardiac medications before your next appointment, please call your pharmacy*  Follow-Up: At [facility], you and your health needs are our priority.  As part of our continuing mission to provide you with exceptional heart care, we have created designated Provider Care Teams.  These Care Teams include your primary Cardiologist (physician)  and Advanced Practice Providers (APPs -  Physician Assistants and Nurse Practitioners) who all work together to provide you with the care you need, when you need it.  Your next appointment:   1 year(s) **Call [phone] in the summer (July/August 2024) to schedule follow-up appointment with your new cardiologist.**  The format for your next appointment:   In Person  Provider:   Candee Furbish, MD or Rudean Haskell, MD or Gwyndolyn Kaufman, MD  Important Information About Sugar         Signed, Sinclair Grooms, MD  10/28/2022 9:46 AM    [place]

## 2022-10-28 ENCOUNTER — Ambulatory Visit: Payer: Medicare Other | Attending: Interventional Cardiology | Admitting: Interventional Cardiology

## 2022-10-28 ENCOUNTER — Encounter: Payer: Self-pay | Admitting: Interventional Cardiology

## 2022-10-28 VITALS — BP 140/70 | HR 51 | Ht 68.0 in | Wt 157.2 lb

## 2022-10-28 DIAGNOSIS — E782 Mixed hyperlipidemia: Secondary | ICD-10-CM

## 2022-10-28 DIAGNOSIS — R040 Epistaxis: Secondary | ICD-10-CM

## 2022-10-28 DIAGNOSIS — Z7901 Long term (current) use of anticoagulants: Secondary | ICD-10-CM

## 2022-10-28 DIAGNOSIS — I10 Essential (primary) hypertension: Secondary | ICD-10-CM

## 2022-10-28 DIAGNOSIS — I480 Paroxysmal atrial fibrillation: Secondary | ICD-10-CM | POA: Diagnosis not present

## 2022-10-28 NOTE — Patient Instructions (Signed)
Medication Instructions:  Your physician recommends that you continue on your current medications as directed. Please refer to the Current Medication list given to you today.  *If you need a refill on your cardiac medications before your next appointment, please call your pharmacy*  Follow-Up: At [facility], you and your health needs are our priority.  As part of our continuing mission to provide you with exceptional heart care, we have created designated Provider Care Teams.  These Care Teams include your primary Cardiologist (physician) and Advanced Practice Providers (APPs -  Physician Assistants and Nurse Practitioners) who all work together to provide you with the care you need, when you need it.  Your next appointment:   1 year(s) **Call [phone] in the summer (July/August 2024) to schedule follow-up appointment with your new cardiologist.**  The format for your next appointment:   In Person  Provider:   Candee Furbish, MD or Rudean Haskell, MD or Gwyndolyn Kaufman, MD  Important Information About Sugar

## 2022-11-19 DIAGNOSIS — I480 Paroxysmal atrial fibrillation: Secondary | ICD-10-CM | POA: Diagnosis not present

## 2022-11-19 DIAGNOSIS — G309 Alzheimer's disease, unspecified: Secondary | ICD-10-CM | POA: Diagnosis not present

## 2022-11-19 DIAGNOSIS — G4701 Insomnia due to medical condition: Secondary | ICD-10-CM | POA: Diagnosis not present

## 2022-11-19 DIAGNOSIS — E1169 Type 2 diabetes mellitus with other specified complication: Secondary | ICD-10-CM | POA: Diagnosis not present

## 2022-11-19 DIAGNOSIS — Z6824 Body mass index (BMI) 24.0-24.9, adult: Secondary | ICD-10-CM | POA: Diagnosis not present

## 2022-12-01 NOTE — Progress Notes (Unsigned)
Electrophysiology Office Follow up Visit Note:    Date:  12/01/2022   ID:  [name], DOB 24-Jul-1941, MRN [id number]  PCP:  Kathyrn Lass, MD  [facility] Cardiologist:  Sinclair Grooms, MD  [facility] Electrophysiologist:  Vickie Epley, MD    Interval History:    [name] is a 82 y.o. male who presents for a follow up visit. They were last seen in clinic June 13, 2022 by me for a history of paroxysmal atrial fibrillation.  He is prescribed Eliquis for stroke prophylaxis.  When I last saw him he was asymptomatic with his atrial fibrillation.  We also discussed the Watchman device given history of epistaxis and trouble with affording the medication.  He initially decided to proceed with watchman implant but then he opted to avoid the procedure.  He saw Dr. Tamala Julian in clinic October 28, 2022.  At that appointment his wife reported trouble with his weight.  He was on aspirin given concerns of nosebleeds.      Past Medical History:  Diagnosis Date   Arthritis    Attention deficit disorder    Blind left eye    Diabetes mellitus without complication ([place])    Hyperlipidemia    Hypertension     Past Surgical History:  Procedure Laterality Date   EYE SURGERY Left    x3   KNEE ARTHROSCOPY Bilateral 03/29/2014   Procedure: RIGHT KNEE ARTHROSCOPY AND DEBRIDEMENT, LEFT KNEE ASPIRATION,  LEFT KNEE ARTHROSCOPY AND DEBRIDEMENT.;  Surgeon: Meredith Pel, MD;  Location: [place];  Service: Orthopedics;  Laterality: Bilateral;  RIGHT KNEE ARTHROSCOPY AND DEBRIDEMENT, LEFT KNEE ASPIRATION,  LEFT KNEE ARTHROSCOPY AND DEBRIDEMENT.   KNEE ARTHROTOMY Bilateral 04/26/2014   Procedure: KNEE ARTHROTOMY, SYNOVECTOMY, ANTIBIOTIC BEAD PLACEMENT.;  Surgeon: Meredith Pel, MD;  Location: [place];  Service: Orthopedics;  Laterality: Bilateral;   lipoma removal, right upper back     TONSILLECTOMY     TOTAL HIP ARTHROPLASTY Left 09/14/2019   TOTAL HIP ARTHROPLASTY Left 09/14/2019    Procedure: LEFT TOTAL HIP ARTHROPLASTY ANTERIOR APPROACH;  Surgeon: Meredith Pel, MD;  Location: [place];  Service: Orthopedics;  Laterality: Left;    Current Medications: No outpatient medications have been marked as taking for the 12/02/22 encounter (Appointment) with Vickie Epley, MD.     Allergies:   Patient has no known allergies.   Social History   Socioeconomic History   Marital status: Married    Spouse name: Not on file   Number of children: Not on file   Years of education: Not on file   Highest education level: Not on file  Occupational History   Not on file  Tobacco Use   Smoking status: Former   Smokeless tobacco: Never  Vaping Use   Vaping Use: Never used  Substance and Sexual Activity   Alcohol use: Yes    Comment: occasional    Drug use: No   Sexual activity: Not on file  Other Topics Concern   Not on file  Social History Narrative   Not on file   Social Determinants of Health   Financial Resource Strain: Not on file  Food Insecurity: Not on file  Transportation Needs: Not on file  Physical Activity: Not on file  Stress: Not on file  Social Connections: Not on file     Family History: The patient's family history includes Heart attack in his brother, father, and sister.  ROS:   Please see the  history of present illness.    All other systems reviewed and are negative.  EKGs/Labs/Other Studies Reviewed:    The following studies were reviewed today:    Recent Labs: 07/03/2022: TSH 0.655 07/05/2022: Magnesium 1.9 07/16/2022: BUN 13; Creatinine, Ser 1.19; Hemoglobin 14.5; Platelets 388; Potassium 4.8; Sodium 137  Recent Lipid Panel    Component Value Date/Time   CHOL 166 09/15/2019 0323   TRIG 33 09/15/2019 0323   HDL 78 09/15/2019 0323   CHOLHDL 2.1 09/15/2019 0323   VLDL 7 09/15/2019 0323   LDLCALC 81 09/15/2019 0323    Physical Exam:    VS:  There were no vitals taken for this visit.    Wt Readings from Last 3  Encounters:  10/28/22 157 lb 3.2 oz (71.3 kg)  07/16/22 157 lb (71.2 kg)  07/03/22 157 lb (71.2 kg)     GEN: *** Well nourished, well developed in no acute distress HEENT: Normal NECK: No JVD; No carotid bruits LYMPHATICS: No lymphadenopathy CARDIAC: ***RRR, no murmurs, rubs, gallops RESPIRATORY:  Clear to auscultation without rales, wheezing or rhonchi  ABDOMEN: Soft, non-tender, non-distended MUSCULOSKELETAL:  No edema; No deformity  SKIN: Warm and dry NEUROLOGIC:  Alert and oriented x 3 PSYCHIATRIC:  Normal affect        ASSESSMENT:    No diagnosis found. PLAN:    In order of problems listed above:  #Paroxysmal atrial fibrillation Low burden.  Extensive discussions in the past about his anticoagulation strategy to help mitigate his stroke risk.  Anticoagulation has been strongly encouraged by Dr. Tamala Julian and myself but the patient and his family have declined on multiple occasions citing a history of epistaxis.  Left atrial appendage occlusion was also discussed in the past and the patient initially decided to proceed with the procedure but this was ultimately avoided given a concern about procedural risks.   #Epistaxis On aspirin monotherapy.  Follows with his primary care physician.  Will need to monitor this closely while on antiplatelet/anticoagulation.  Follow-up with EP on an as-needed basis. \     Medication Adjustments/Labs and Tests Ordered: Current medicines are reviewed at length with the patient today.  Concerns regarding medicines are outlined above.  No orders of the defined types were placed in this encounter.  No orders of the defined types were placed in this encounter.    Signed, Lars Mage, MD, [facility], [facility] 12/01/2022 6:16 PM    Electrophysiology [facility]

## 2022-12-02 ENCOUNTER — Ambulatory Visit: Payer: Medicare Other | Attending: Cardiology | Admitting: Cardiology

## 2022-12-02 ENCOUNTER — Encounter: Payer: Self-pay | Admitting: Cardiology

## 2022-12-02 VITALS — BP 124/50 | HR 70 | Ht 68.0 in | Wt 158.0 lb

## 2022-12-02 DIAGNOSIS — I480 Paroxysmal atrial fibrillation: Secondary | ICD-10-CM | POA: Diagnosis not present

## 2022-12-02 DIAGNOSIS — R040 Epistaxis: Secondary | ICD-10-CM | POA: Diagnosis not present

## 2022-12-02 NOTE — Patient Instructions (Signed)
Medication Instructions:  Your physician recommends that you continue on your current medications as directed. Please refer to the Current Medication list given to you today.  *If you need a refill on your cardiac medications before your next appointment, please call your pharmacy*   Lab Work: None ordered   Testing/Procedures: None ordered   Follow-Up: At [facility], you and your health needs are our priority.  As part of our continuing mission to provide you with exceptional heart care, we have created designated Provider Care Teams.  These Care Teams include your primary Cardiologist (physician) and Advanced Practice Providers (APPs -  Physician Assistants and Nurse Practitioners) who all work together to provide you with the care you need, when you need it.  We recommend signing up for the patient portal called "MyChart".  Sign up information is provided on this After Visit Summary.  MyChart is used to connect with patients for Virtual Visits (Telemedicine).  Patients are able to view lab/test results, encounter notes, upcoming appointments, etc.  Non-urgent messages can be sent to your provider as well.   To learn more about what you can do with MyChart, go to [email or web address].    Your next appointment:   As needed  The format for your next appointment:   In Person  Provider:   Dr. Quentin Ore     Thank you for choosing [facility]!!   [phone]  Other Instructions

## 2022-12-02 NOTE — Progress Notes (Signed)
Electrophysiology Office Follow up Visit Note:    Date:  12/02/2022   ID:  [name], DOB 16-Jan-1941, MRN [id number]  PCP:  Kathyrn Lass, MD  [facility] Cardiologist:  Sinclair Grooms, MD  [facility] Electrophysiologist:  Vickie Epley, MD    Interval History:    [name] is a 82 y.o. male who presents for a follow up visit. They were last seen in clinic June 13, 2022 by me for a history of paroxysmal atrial fibrillation.  He is prescribed Eliquis for stroke prophylaxis.  When I last saw him he was asymptomatic with his atrial fibrillation.  We also discussed the Watchman device given history of epistaxis and trouble with affording the medication.  He initially decided to proceed with watchman implant but then he opted to avoid the procedure.  He saw Dr. Tamala Julian in clinic October 28, 2022.  At that appointment his wife reported trouble with his weight.  He was on aspirin given concerns of nosebleeds.  Today, he is accompanied by his wife. He has not had any recent nosebleeds.He is currently compliant with his Aspirin 81 MG. He is not on any blood thinners at this time. He is not very active as he is limited by his dementia. However he is able to complete daily activities. He is scheduled to receive a colonoscopy soon.  He denies any chest pain, shortness of breath, or peripheral edema. No lightheadedness, headaches, syncope, orthopnea, or PND.      Past Medical History:  Diagnosis Date   Arthritis    Attention deficit disorder    Blind left eye    Diabetes mellitus without complication ([place])    Hyperlipidemia    Hypertension     Past Surgical History:  Procedure Laterality Date   EYE SURGERY Left    x3   KNEE ARTHROSCOPY Bilateral 03/29/2014   Procedure: RIGHT KNEE ARTHROSCOPY AND DEBRIDEMENT, LEFT KNEE ASPIRATION,  LEFT KNEE ARTHROSCOPY AND DEBRIDEMENT.;  Surgeon: Meredith Pel, MD;  Location: [place];  Service: Orthopedics;  Laterality: Bilateral;   RIGHT KNEE ARTHROSCOPY AND DEBRIDEMENT, LEFT KNEE ASPIRATION,  LEFT KNEE ARTHROSCOPY AND DEBRIDEMENT.   KNEE ARTHROTOMY Bilateral 04/26/2014   Procedure: KNEE ARTHROTOMY, SYNOVECTOMY, ANTIBIOTIC BEAD PLACEMENT.;  Surgeon: Meredith Pel, MD;  Location: [place];  Service: Orthopedics;  Laterality: Bilateral;   lipoma removal, right upper back     TONSILLECTOMY     TOTAL HIP ARTHROPLASTY Left 09/14/2019   TOTAL HIP ARTHROPLASTY Left 09/14/2019   Procedure: LEFT TOTAL HIP ARTHROPLASTY ANTERIOR APPROACH;  Surgeon: Meredith Pel, MD;  Location: [place];  Service: Orthopedics;  Laterality: Left;    Current Medications: Current Meds  Medication Sig   aspirin EC 81 MG tablet Take 1 tablet (81 mg total) by mouth daily. Swallow whole.   donepezil (ARICEPT) 5 MG tablet Take 5 mg by mouth at bedtime.   magnesium oxide (MAG-OX) 400 (240 Mg) MG tablet Take 0.5 tablets (200 mg total) by mouth 2 (two) times daily.   mirtazapine (REMERON) 15 MG tablet Take 15 mg by mouth at bedtime.   omeprazole (PRILOSEC) 20 MG capsule Take 20 mg by mouth 2 (two) times daily before a meal.   QUEtiapine (SEROQUEL) 25 MG tablet Take 1 tablet (25 mg total) by mouth at bedtime.     Allergies:   Patient has no known allergies.   Social History   Socioeconomic History   Marital status: Married    Spouse name: Not  on file   Number of children: Not on file   Years of education: Not on file   Highest education level: Not on file  Occupational History   Not on file  Tobacco Use   Smoking status: Former   Smokeless tobacco: Never  Vaping Use   Vaping Use: Never used  Substance and Sexual Activity   Alcohol use: Yes    Comment: occasional    Drug use: No   Sexual activity: Not on file  Other Topics Concern   Not on file  Social History Narrative   Not on file   Social Determinants of Health   Financial Resource Strain: Not on file  Food Insecurity: Not on file  Transportation Needs: Not on file  Physical  Activity: Not on file  Stress: Not on file  Social Connections: Not on file     Family History: The patient's family history includes Heart attack in his brother, father, and sister.  ROS:   Please see the history of present illness.     All other systems reviewed and are negative.  EKGs/Labs/Other Studies Reviewed:    The following studies were reviewed today:    Recent Labs: 07/03/2022: TSH 0.655 07/05/2022: Magnesium 1.9 07/16/2022: BUN 13; Creatinine, Ser 1.19; Hemoglobin 14.5; Platelets 388; Potassium 4.8; Sodium 137  Recent Lipid Panel    Component Value Date/Time   CHOL 166 09/15/2019 0323   TRIG 33 09/15/2019 0323   HDL 78 09/15/2019 0323   CHOLHDL 2.1 09/15/2019 0323   VLDL 7 09/15/2019 0323   LDLCALC 81 09/15/2019 0323    Physical Exam:    VS:  BP (!) 124/50   Pulse 70   Ht '5\' 8"'$  (1.727 m)   Wt 158 lb (71.7 kg)   SpO2 98%   BMI 24.02 kg/m     Wt Readings from Last 3 Encounters:  12/02/22 158 lb (71.7 kg)  10/28/22 157 lb 3.2 oz (71.3 kg)  07/16/22 157 lb (71.2 kg)     GEN:  Well nourished, well developed in no acute distress.  Elderly CARDIAC: RRR, no murmurs, rubs, gallops RESPIRATORY:  Clear to auscultation without rales, wheezing or rhonchi  PSYCHIATRIC: Flat affect        ASSESSMENT:    1. PAF (paroxysmal atrial fibrillation) ([facility])   2. Epistaxis    PLAN:    In order of problems listed above:  #Paroxysmal atrial fibrillation Low burden.  Extensive discussions in the past about his anticoagulation strategy to help mitigate his stroke risk.  Anticoagulation has been strongly encouraged by Dr. Tamala Julian and myself but the patient and his family have declined on multiple occasions citing a history of epistaxis.  Left atrial appendage occlusion was also discussed in the past and the patient initially decided to proceed with the procedure but this was ultimately avoided given a concern about procedural risks.  #Epistaxis On aspirin monotherapy.   Follows with his primary care physician.  Will need to monitor this closely while on antiplatelet/anticoagulation.   Follow-up with EP on an as-needed basis.     Medication Adjustments/Labs and Tests Ordered: Current medicines are reviewed at length with the patient today.  Concerns regarding medicines are outlined above.  No orders of the defined types were placed in this encounter.  No orders of the defined types were placed in this encounter.   I,Mitra Faeizi,acting as a Education administrator for Vickie Epley, MD.,have documented all relevant documentation on the behalf of Vickie Epley, MD,as directed by  Vickie Epley, MD while in the presence of Vickie Epley, MD.  I, Vickie Epley, MD, have reviewed all documentation for this visit. The documentation on 12/02/22 for the exam, diagnosis, procedures, and orders are all accurate and complete.    Signed, Lars Mage, MD, [facility], [facility] 12/02/2022 8:57 AM    Electrophysiology [facility]

## 2023-01-24 DIAGNOSIS — M2042 Other hammer toe(s) (acquired), left foot: Secondary | ICD-10-CM | POA: Diagnosis not present

## 2023-01-24 DIAGNOSIS — M2041 Other hammer toe(s) (acquired), right foot: Secondary | ICD-10-CM | POA: Diagnosis not present

## 2023-01-24 DIAGNOSIS — E1151 Type 2 diabetes mellitus with diabetic peripheral angiopathy without gangrene: Secondary | ICD-10-CM | POA: Diagnosis not present

## 2023-01-24 DIAGNOSIS — L84 Corns and callosities: Secondary | ICD-10-CM | POA: Diagnosis not present

## 2023-01-24 DIAGNOSIS — M9262 Juvenile osteochondrosis of tarsus, left ankle: Secondary | ICD-10-CM | POA: Diagnosis not present

## 2023-02-24 DIAGNOSIS — H9202 Otalgia, left ear: Secondary | ICD-10-CM | POA: Diagnosis not present

## 2023-02-24 DIAGNOSIS — Z6824 Body mass index (BMI) 24.0-24.9, adult: Secondary | ICD-10-CM | POA: Diagnosis not present

## 2023-02-24 DIAGNOSIS — F0280 Dementia in other diseases classified elsewhere without behavioral disturbance: Secondary | ICD-10-CM | POA: Diagnosis not present

## 2023-02-24 DIAGNOSIS — M79641 Pain in right hand: Secondary | ICD-10-CM | POA: Diagnosis not present

## 2023-02-24 DIAGNOSIS — M47816 Spondylosis without myelopathy or radiculopathy, lumbar region: Secondary | ICD-10-CM | POA: Diagnosis not present

## 2023-02-24 DIAGNOSIS — G309 Alzheimer's disease, unspecified: Secondary | ICD-10-CM | POA: Diagnosis not present

## 2023-02-24 DIAGNOSIS — M79642 Pain in left hand: Secondary | ICD-10-CM | POA: Diagnosis not present

## 2023-04-09 DIAGNOSIS — Z4422 Encounter for fitting and adjustment of artificial left eye: Secondary | ICD-10-CM | POA: Diagnosis not present

## 2023-04-19 DIAGNOSIS — E538 Deficiency of other specified B group vitamins: Secondary | ICD-10-CM | POA: Diagnosis not present

## 2023-04-19 DIAGNOSIS — E663 Overweight: Secondary | ICD-10-CM | POA: Diagnosis not present

## 2023-04-19 DIAGNOSIS — Z87891 Personal history of nicotine dependence: Secondary | ICD-10-CM | POA: Diagnosis not present

## 2023-04-19 DIAGNOSIS — G4700 Insomnia, unspecified: Secondary | ICD-10-CM | POA: Diagnosis not present

## 2023-04-19 DIAGNOSIS — M1990 Unspecified osteoarthritis, unspecified site: Secondary | ICD-10-CM | POA: Diagnosis not present

## 2023-04-19 DIAGNOSIS — F0283 Dementia in other diseases classified elsewhere, unspecified severity, with mood disturbance: Secondary | ICD-10-CM | POA: Diagnosis not present

## 2023-04-19 DIAGNOSIS — F02A3 Dementia in other diseases classified elsewhere, mild, with mood disturbance: Secondary | ICD-10-CM | POA: Diagnosis not present

## 2023-04-19 DIAGNOSIS — M62838 Other muscle spasm: Secondary | ICD-10-CM | POA: Diagnosis not present

## 2023-04-19 DIAGNOSIS — I82503 Chronic embolism and thrombosis of unspecified deep veins of lower extremity, bilateral: Secondary | ICD-10-CM | POA: Diagnosis not present

## 2023-04-19 DIAGNOSIS — K219 Gastro-esophageal reflux disease without esophagitis: Secondary | ICD-10-CM | POA: Diagnosis not present

## 2023-04-19 DIAGNOSIS — Z7982 Long term (current) use of aspirin: Secondary | ICD-10-CM | POA: Diagnosis not present

## 2023-04-19 DIAGNOSIS — G8929 Other chronic pain: Secondary | ICD-10-CM | POA: Diagnosis not present

## 2023-04-22 DIAGNOSIS — M2041 Other hammer toe(s) (acquired), right foot: Secondary | ICD-10-CM | POA: Diagnosis not present

## 2023-04-22 DIAGNOSIS — L632 Ophiasis: Secondary | ICD-10-CM | POA: Diagnosis not present

## 2023-04-22 DIAGNOSIS — L84 Corns and callosities: Secondary | ICD-10-CM | POA: Diagnosis not present

## 2023-04-22 DIAGNOSIS — I70203 Unspecified atherosclerosis of native arteries of extremities, bilateral legs: Secondary | ICD-10-CM | POA: Diagnosis not present

## 2023-04-22 DIAGNOSIS — M2042 Other hammer toe(s) (acquired), left foot: Secondary | ICD-10-CM | POA: Diagnosis not present

## 2023-04-22 DIAGNOSIS — B351 Tinea unguium: Secondary | ICD-10-CM | POA: Diagnosis not present

## 2023-04-22 DIAGNOSIS — E1151 Type 2 diabetes mellitus with diabetic peripheral angiopathy without gangrene: Secondary | ICD-10-CM | POA: Diagnosis not present

## 2023-04-22 DIAGNOSIS — M9262 Juvenile osteochondrosis of tarsus, left ankle: Secondary | ICD-10-CM | POA: Diagnosis not present

## 2023-05-18 DIAGNOSIS — L601 Onycholysis: Secondary | ICD-10-CM | POA: Diagnosis not present

## 2023-07-23 DIAGNOSIS — M9262 Juvenile osteochondrosis of tarsus, left ankle: Secondary | ICD-10-CM | POA: Diagnosis not present

## 2023-07-23 DIAGNOSIS — M2041 Other hammer toe(s) (acquired), right foot: Secondary | ICD-10-CM | POA: Diagnosis not present

## 2023-07-23 DIAGNOSIS — I70203 Unspecified atherosclerosis of native arteries of extremities, bilateral legs: Secondary | ICD-10-CM | POA: Diagnosis not present

## 2023-07-23 DIAGNOSIS — E1151 Type 2 diabetes mellitus with diabetic peripheral angiopathy without gangrene: Secondary | ICD-10-CM | POA: Diagnosis not present

## 2023-07-23 DIAGNOSIS — B351 Tinea unguium: Secondary | ICD-10-CM | POA: Diagnosis not present

## 2023-07-23 DIAGNOSIS — L84 Corns and callosities: Secondary | ICD-10-CM | POA: Diagnosis not present

## 2023-07-23 DIAGNOSIS — M2042 Other hammer toe(s) (acquired), left foot: Secondary | ICD-10-CM | POA: Diagnosis not present

## 2023-08-01 DIAGNOSIS — Z23 Encounter for immunization: Secondary | ICD-10-CM | POA: Diagnosis not present

## 2023-08-01 DIAGNOSIS — Z1389 Encounter for screening for other disorder: Secondary | ICD-10-CM | POA: Diagnosis not present

## 2023-08-01 DIAGNOSIS — Z0000 Encounter for general adult medical examination without abnormal findings: Secondary | ICD-10-CM | POA: Diagnosis not present

## 2023-08-01 DIAGNOSIS — Z6823 Body mass index (BMI) 23.0-23.9, adult: Secondary | ICD-10-CM | POA: Diagnosis not present

## 2023-09-01 DIAGNOSIS — S60429A Blister (nonthermal) of unspecified finger, initial encounter: Secondary | ICD-10-CM | POA: Diagnosis not present

## 2023-09-08 DIAGNOSIS — Z6823 Body mass index (BMI) 23.0-23.9, adult: Secondary | ICD-10-CM | POA: Diagnosis not present

## 2023-09-08 DIAGNOSIS — E538 Deficiency of other specified B group vitamins: Secondary | ICD-10-CM | POA: Diagnosis not present

## 2023-09-08 DIAGNOSIS — N390 Urinary tract infection, site not specified: Secondary | ICD-10-CM | POA: Diagnosis not present

## 2023-09-08 DIAGNOSIS — F0280 Dementia in other diseases classified elsewhere without behavioral disturbance: Secondary | ICD-10-CM | POA: Diagnosis not present

## 2023-09-08 DIAGNOSIS — E119 Type 2 diabetes mellitus without complications: Secondary | ICD-10-CM | POA: Diagnosis not present

## 2023-09-08 DIAGNOSIS — D649 Anemia, unspecified: Secondary | ICD-10-CM | POA: Diagnosis not present

## 2023-09-08 DIAGNOSIS — I10 Essential (primary) hypertension: Secondary | ICD-10-CM | POA: Diagnosis not present

## 2023-09-08 DIAGNOSIS — E1169 Type 2 diabetes mellitus with other specified complication: Secondary | ICD-10-CM | POA: Diagnosis not present

## 2023-09-08 DIAGNOSIS — G309 Alzheimer's disease, unspecified: Secondary | ICD-10-CM | POA: Diagnosis not present

## 2023-09-08 DIAGNOSIS — I480 Paroxysmal atrial fibrillation: Secondary | ICD-10-CM | POA: Diagnosis not present

## 2023-09-08 DIAGNOSIS — K219 Gastro-esophageal reflux disease without esophagitis: Secondary | ICD-10-CM | POA: Diagnosis not present

## 2023-09-08 DIAGNOSIS — E7800 Pure hypercholesterolemia, unspecified: Secondary | ICD-10-CM | POA: Diagnosis not present

## 2023-10-29 DIAGNOSIS — H52221 Regular astigmatism, right eye: Secondary | ICD-10-CM | POA: Diagnosis not present

## 2023-10-29 DIAGNOSIS — H26491 Other secondary cataract, right eye: Secondary | ICD-10-CM | POA: Diagnosis not present

## 2023-10-29 DIAGNOSIS — H5211 Myopia, right eye: Secondary | ICD-10-CM | POA: Diagnosis not present

## 2023-11-05 DIAGNOSIS — E1151 Type 2 diabetes mellitus with diabetic peripheral angiopathy without gangrene: Secondary | ICD-10-CM | POA: Diagnosis not present

## 2023-11-05 DIAGNOSIS — I70203 Unspecified atherosclerosis of native arteries of extremities, bilateral legs: Secondary | ICD-10-CM | POA: Diagnosis not present

## 2023-11-05 DIAGNOSIS — B351 Tinea unguium: Secondary | ICD-10-CM | POA: Diagnosis not present

## 2023-11-05 DIAGNOSIS — M9262 Juvenile osteochondrosis of tarsus, left ankle: Secondary | ICD-10-CM | POA: Diagnosis not present

## 2023-11-05 DIAGNOSIS — L84 Corns and callosities: Secondary | ICD-10-CM | POA: Diagnosis not present

## 2023-11-05 DIAGNOSIS — M2042 Other hammer toe(s) (acquired), left foot: Secondary | ICD-10-CM | POA: Diagnosis not present

## 2023-11-05 DIAGNOSIS — M2041 Other hammer toe(s) (acquired), right foot: Secondary | ICD-10-CM | POA: Diagnosis not present

## 2024-02-06 DIAGNOSIS — E1151 Type 2 diabetes mellitus with diabetic peripheral angiopathy without gangrene: Secondary | ICD-10-CM | POA: Diagnosis not present

## 2024-02-06 DIAGNOSIS — B351 Tinea unguium: Secondary | ICD-10-CM | POA: Diagnosis not present

## 2024-02-06 DIAGNOSIS — L84 Corns and callosities: Secondary | ICD-10-CM | POA: Diagnosis not present

## 2024-02-06 DIAGNOSIS — I70203 Unspecified atherosclerosis of native arteries of extremities, bilateral legs: Secondary | ICD-10-CM | POA: Diagnosis not present

## 2024-02-06 DIAGNOSIS — M9262 Juvenile osteochondrosis of tarsus, left ankle: Secondary | ICD-10-CM | POA: Diagnosis not present

## 2024-02-06 DIAGNOSIS — M2042 Other hammer toe(s) (acquired), left foot: Secondary | ICD-10-CM | POA: Diagnosis not present

## 2024-02-06 DIAGNOSIS — M2041 Other hammer toe(s) (acquired), right foot: Secondary | ICD-10-CM | POA: Diagnosis not present

## 2024-03-23 DIAGNOSIS — Z4422 Encounter for fitting and adjustment of artificial left eye: Secondary | ICD-10-CM | POA: Diagnosis not present

## 2024-04-19 DIAGNOSIS — N390 Urinary tract infection, site not specified: Secondary | ICD-10-CM | POA: Diagnosis not present

## 2024-04-26 DIAGNOSIS — B351 Tinea unguium: Secondary | ICD-10-CM | POA: Diagnosis not present

## 2024-04-26 DIAGNOSIS — L84 Corns and callosities: Secondary | ICD-10-CM | POA: Diagnosis not present

## 2024-04-26 DIAGNOSIS — E1151 Type 2 diabetes mellitus with diabetic peripheral angiopathy without gangrene: Secondary | ICD-10-CM | POA: Diagnosis not present

## 2024-04-26 DIAGNOSIS — M9262 Juvenile osteochondrosis of tarsus, left ankle: Secondary | ICD-10-CM | POA: Diagnosis not present

## 2024-04-26 DIAGNOSIS — M2041 Other hammer toe(s) (acquired), right foot: Secondary | ICD-10-CM | POA: Diagnosis not present

## 2024-04-26 DIAGNOSIS — M2042 Other hammer toe(s) (acquired), left foot: Secondary | ICD-10-CM | POA: Diagnosis not present

## 2024-04-26 DIAGNOSIS — I70203 Unspecified atherosclerosis of native arteries of extremities, bilateral legs: Secondary | ICD-10-CM | POA: Diagnosis not present

## 2024-06-08 DIAGNOSIS — G4700 Insomnia, unspecified: Secondary | ICD-10-CM | POA: Diagnosis not present

## 2024-06-08 DIAGNOSIS — I480 Paroxysmal atrial fibrillation: Secondary | ICD-10-CM | POA: Diagnosis not present

## 2024-06-08 DIAGNOSIS — E785 Hyperlipidemia, unspecified: Secondary | ICD-10-CM | POA: Diagnosis not present

## 2024-06-08 DIAGNOSIS — F419 Anxiety disorder, unspecified: Secondary | ICD-10-CM | POA: Diagnosis not present

## 2024-06-08 DIAGNOSIS — E538 Deficiency of other specified B group vitamins: Secondary | ICD-10-CM | POA: Diagnosis not present

## 2024-06-08 DIAGNOSIS — F0283 Dementia in other diseases classified elsewhere, unspecified severity, with mood disturbance: Secondary | ICD-10-CM | POA: Diagnosis not present

## 2024-06-08 DIAGNOSIS — G8929 Other chronic pain: Secondary | ICD-10-CM | POA: Diagnosis not present

## 2024-06-08 DIAGNOSIS — D6869 Other thrombophilia: Secondary | ICD-10-CM | POA: Diagnosis not present

## 2024-06-08 DIAGNOSIS — E1169 Type 2 diabetes mellitus with other specified complication: Secondary | ICD-10-CM | POA: Diagnosis not present

## 2024-06-08 DIAGNOSIS — E1142 Type 2 diabetes mellitus with diabetic polyneuropathy: Secondary | ICD-10-CM | POA: Diagnosis not present

## 2024-06-08 DIAGNOSIS — F3341 Major depressive disorder, recurrent, in partial remission: Secondary | ICD-10-CM | POA: Diagnosis not present

## 2024-06-08 DIAGNOSIS — F0284 Dementia in other diseases classified elsewhere, unspecified severity, with anxiety: Secondary | ICD-10-CM | POA: Diagnosis not present

## 2024-08-02 DIAGNOSIS — Z23 Encounter for immunization: Secondary | ICD-10-CM | POA: Diagnosis not present

## 2024-08-02 DIAGNOSIS — Z0000 Encounter for general adult medical examination without abnormal findings: Secondary | ICD-10-CM | POA: Diagnosis not present

## 2024-08-03 DIAGNOSIS — R001 Bradycardia, unspecified: Secondary | ICD-10-CM | POA: Diagnosis not present

## 2024-08-03 DIAGNOSIS — G309 Alzheimer's disease, unspecified: Secondary | ICD-10-CM | POA: Diagnosis not present

## 2024-08-03 DIAGNOSIS — E119 Type 2 diabetes mellitus without complications: Secondary | ICD-10-CM | POA: Diagnosis not present

## 2024-08-03 DIAGNOSIS — I480 Paroxysmal atrial fibrillation: Secondary | ICD-10-CM | POA: Diagnosis not present

## 2024-08-04 DIAGNOSIS — M2042 Other hammer toe(s) (acquired), left foot: Secondary | ICD-10-CM | POA: Diagnosis not present

## 2024-08-04 DIAGNOSIS — M2041 Other hammer toe(s) (acquired), right foot: Secondary | ICD-10-CM | POA: Diagnosis not present

## 2024-08-04 DIAGNOSIS — L84 Corns and callosities: Secondary | ICD-10-CM | POA: Diagnosis not present

## 2024-08-04 DIAGNOSIS — E1151 Type 2 diabetes mellitus with diabetic peripheral angiopathy without gangrene: Secondary | ICD-10-CM | POA: Diagnosis not present

## 2024-08-04 DIAGNOSIS — I70203 Unspecified atherosclerosis of native arteries of extremities, bilateral legs: Secondary | ICD-10-CM | POA: Diagnosis not present

## 2024-08-04 DIAGNOSIS — M9262 Juvenile osteochondrosis of tarsus, left ankle: Secondary | ICD-10-CM | POA: Diagnosis not present

## 2024-08-04 DIAGNOSIS — B351 Tinea unguium: Secondary | ICD-10-CM | POA: Diagnosis not present

## 2024-09-06 ENCOUNTER — Inpatient Hospital Stay (HOSPITAL_BASED_OUTPATIENT_CLINIC_OR_DEPARTMENT_OTHER)
Admission: EM | Admit: 2024-09-06 | Discharge: 2024-09-11 | DRG: 229 | Disposition: A | Discharge status: Home Health | Attending: Family Medicine | Admitting: Family Medicine

## 2024-09-06 ENCOUNTER — Other Ambulatory Visit: Payer: Self-pay

## 2024-09-06 ENCOUNTER — Emergency Department (HOSPITAL_BASED_OUTPATIENT_CLINIC_OR_DEPARTMENT_OTHER)

## 2024-09-06 ENCOUNTER — Encounter (HOSPITAL_BASED_OUTPATIENT_CLINIC_OR_DEPARTMENT_OTHER): Payer: Self-pay | Admitting: Emergency Medicine

## 2024-09-06 DIAGNOSIS — Z0389 Encounter for observation for other suspected diseases and conditions ruled out: Secondary | ICD-10-CM | POA: Diagnosis not present

## 2024-09-06 DIAGNOSIS — F03918 Unspecified dementia, unspecified severity, with other behavioral disturbance: Secondary | ICD-10-CM | POA: Diagnosis present

## 2024-09-06 DIAGNOSIS — Z006 Encounter for examination for normal comparison and control in clinical research program: Secondary | ICD-10-CM | POA: Diagnosis not present

## 2024-09-06 DIAGNOSIS — E119 Type 2 diabetes mellitus without complications: Secondary | ICD-10-CM | POA: Diagnosis not present

## 2024-09-06 DIAGNOSIS — Z87891 Personal history of nicotine dependence: Secondary | ICD-10-CM

## 2024-09-06 DIAGNOSIS — R001 Bradycardia, unspecified: Secondary | ICD-10-CM | POA: Diagnosis not present

## 2024-09-06 DIAGNOSIS — N3000 Acute cystitis without hematuria: Secondary | ICD-10-CM | POA: Diagnosis present

## 2024-09-06 DIAGNOSIS — I10 Essential (primary) hypertension: Secondary | ICD-10-CM | POA: Diagnosis present

## 2024-09-06 DIAGNOSIS — R4182 Altered mental status, unspecified: Secondary | ICD-10-CM | POA: Diagnosis not present

## 2024-09-06 DIAGNOSIS — H5462 Unqualified visual loss, left eye, normal vision right eye: Secondary | ICD-10-CM | POA: Diagnosis present

## 2024-09-06 DIAGNOSIS — E782 Mixed hyperlipidemia: Secondary | ICD-10-CM | POA: Diagnosis present

## 2024-09-06 DIAGNOSIS — I771 Stricture of artery: Secondary | ICD-10-CM | POA: Diagnosis not present

## 2024-09-06 DIAGNOSIS — I700 Atherosclerosis of aorta: Secondary | ICD-10-CM | POA: Diagnosis not present

## 2024-09-06 DIAGNOSIS — Z96642 Presence of left artificial hip joint: Secondary | ICD-10-CM | POA: Diagnosis not present

## 2024-09-06 DIAGNOSIS — K219 Gastro-esophageal reflux disease without esophagitis: Secondary | ICD-10-CM | POA: Diagnosis present

## 2024-09-06 DIAGNOSIS — I480 Paroxysmal atrial fibrillation: Secondary | ICD-10-CM | POA: Diagnosis not present

## 2024-09-06 DIAGNOSIS — Z8249 Family history of ischemic heart disease and other diseases of the circulatory system: Secondary | ICD-10-CM | POA: Diagnosis not present

## 2024-09-06 DIAGNOSIS — E785 Hyperlipidemia, unspecified: Secondary | ICD-10-CM | POA: Diagnosis not present

## 2024-09-06 DIAGNOSIS — I442 Atrioventricular block, complete: Principal | ICD-10-CM | POA: Diagnosis present

## 2024-09-06 DIAGNOSIS — Z86718 Personal history of other venous thrombosis and embolism: Secondary | ICD-10-CM | POA: Diagnosis not present

## 2024-09-06 DIAGNOSIS — Z7982 Long term (current) use of aspirin: Secondary | ICD-10-CM

## 2024-09-06 DIAGNOSIS — F0390 Unspecified dementia without behavioral disturbance: Secondary | ICD-10-CM | POA: Diagnosis not present

## 2024-09-06 DIAGNOSIS — I6782 Cerebral ischemia: Secondary | ICD-10-CM | POA: Diagnosis not present

## 2024-09-06 DIAGNOSIS — Z79899 Other long term (current) drug therapy: Secondary | ICD-10-CM

## 2024-09-06 LAB — CBC
HCT: 45.5 % (ref 39.0–52.0)
Hemoglobin: 14.7 g/dL (ref 13.0–17.0)
MCH: 29 pg (ref 26.0–34.0)
MCHC: 32.3 g/dL (ref 30.0–36.0)
MCV: 89.7 fL (ref 80.0–100.0)
Platelets: 292 K/uL (ref 150–400)
RBC: 5.07 MIL/uL (ref 4.22–5.81)
RDW: 13 % (ref 11.5–15.5)
WBC: 6.9 K/uL (ref 4.0–10.5)
nRBC: 0 % (ref 0.0–0.2)

## 2024-09-06 LAB — URINALYSIS, W/ REFLEX TO CULTURE (INFECTION SUSPECTED)
Bilirubin Urine: NEGATIVE
Glucose, UA: NEGATIVE mg/dL
Ketones, ur: NEGATIVE mg/dL
Nitrite: POSITIVE — AB
Protein, ur: 30 mg/dL — AB
Specific Gravity, Urine: >=1.03 (ref 1.005–1.030)
WBC, UA: >50 WBC/hpf (ref 0–5)
pH: 6 (ref 5.0–8.0)

## 2024-09-06 LAB — COMPREHENSIVE METABOLIC PANEL WITH GFR
ALT: 7 U/L (ref 0–44)
AST: 23 U/L (ref 15–41)
Albumin: 4.3 g/dL (ref 3.5–5.0)
Alkaline Phosphatase: 65 U/L (ref 38–126)
Anion gap: 12 (ref 5–15)
BUN: 24 mg/dL — ABNORMAL HIGH (ref 8–23)
CO2: 25 mmol/L (ref 22–32)
Calcium: 9.4 mg/dL (ref 8.9–10.3)
Chloride: 103 mmol/L (ref 98–111)
Creatinine, Ser: 1.27 mg/dL — ABNORMAL HIGH (ref 0.61–1.24)
GFR, Estimated: 56 mL/min — ABNORMAL LOW (ref >60)
Glucose, Bld: 109 mg/dL — ABNORMAL HIGH (ref 70–99)
Potassium: 4.4 mmol/L (ref 3.5–5.1)
Sodium: 140 mmol/L (ref 135–145)
Total Bilirubin: 0.4 mg/dL (ref 0.0–1.2)
Total Protein: 8.2 g/dL — ABNORMAL HIGH (ref 6.5–8.1)

## 2024-09-06 LAB — RESP PANEL BY RT-PCR (RSV, FLU A&B, COVID)  RVPGX2
Influenza A by PCR: NEGATIVE
Influenza B by PCR: NEGATIVE
Resp Syncytial Virus by PCR: NEGATIVE
SARS Coronavirus 2 by RT PCR: NEGATIVE

## 2024-09-06 LAB — LACTIC ACID, PLASMA
Lactic Acid, Venous: 1.3 mmol/L (ref 0.5–1.9)
Lactic Acid, Venous: 1.4 mmol/L (ref 0.5–1.9)

## 2024-09-06 LAB — PROTIME-INR
INR: 1 (ref 0.8–1.2)
Prothrombin Time: 13.4 s (ref 11.4–15.2)

## 2024-09-06 LAB — CBG MONITORING, ED: Glucose-Capillary: 143 mg/dL — ABNORMAL HIGH (ref 70–99)

## 2024-09-06 MED ORDER — SODIUM CHLORIDE 0.9 % IV BOLUS
1000.0000 mL | Freq: Once | INTRAVENOUS | Status: AC
  Administered 2024-09-06: 1000 mL via INTRAVENOUS

## 2024-09-06 MED ORDER — SODIUM CHLORIDE 0.9 % IV SOLN
2.0000 g | Freq: Once | INTRAVENOUS | Status: AC
  Administered 2024-09-06: 2 g via INTRAVENOUS
  Filled 2024-09-06: qty 20

## 2024-09-06 NOTE — ED Triage Notes (Signed)
 Pt from UC- wife reports concern for possible uti- reports increased urinary frequency, increased confusion x 1 day.    Hx of dementia.

## 2024-09-06 NOTE — ED Notes (Signed)
 Patient transported to CT

## 2024-09-06 NOTE — ED Provider Notes (Signed)
 [name] Provider Note   CSN: [id number] Arrival date & time: 09/06/24  1654     Patient presents with: Altered Mental Status   [name] is a 83 y.o. male with a past medical history of diabetes, hypertension, hyperlipidemia, left eye blindness missing an eye, dementia brought in by his family members for altered mental status.  His wife and granddaughter at bedside.  His wife states that he began becoming more lethargic and confused starting yesterday evening.  She states that they took him to an urgent care this evening to get evaluated but he could not pass water  there so they sent him here for further evaluation.  She reports that he seemed hot to the touch at home.  He has had a slight cough.  No other notable abnormal symptoms at home.  She does report that he fell 2 days ago.  She denies that he hit his head.  He is not on any blood thinners    Altered Mental Status      Prior to Admission medications   Medication Sig Start Date End Date Taking? Authorizing Provider  aspirin  EC 81 MG tablet Take 1 tablet (81 mg total) by mouth daily. Swallow whole. 06/21/22  Yes Claudene Victory ORN, MD  donepezil (ARICEPT) 5 MG tablet Take 5 mg by mouth at bedtime. 09/11/22  Yes [provider]  magnesium  oxide (MAG-OX) 400 (240 Mg) MG tablet Take 0.5 tablets (200 mg total) by mouth 2 (two) times daily. 07/07/22  Yes Regalado, Belkys A, MD  mirtazapine  (REMERON ) 15 MG tablet Take 15 mg by mouth at bedtime. 05/17/22  Yes [provider]  omeprazole (PRILOSEC) 20 MG capsule Take 20 mg by mouth 2 (two) times daily before a meal. 04/19/22  Yes [provider]  QUEtiapine  (SEROQUEL ) 25 MG tablet Take 1 tablet (25 mg total) by mouth at bedtime. 07/07/22  Yes Regalado, Belkys A, MD    Allergies: Patient has no known allergies.    Review of Systems  Updated Vital Signs BP (!) 146/52 (BP Location: Left Arm)   Pulse (!) 30    Temp 98.1 F (36.7 C) (Oral)   Resp 19   Ht 5' 9 (1.753 m)   Wt 71.5 kg   SpO2 98%   BMI 23.28 kg/m   Physical Exam Vitals and nursing note reviewed.  Constitutional:      General: He is not in acute distress.    Appearance: He is well-developed. He is not diaphoretic.  HENT:     Head: Normocephalic and atraumatic.  Eyes:     General: No scleral icterus.    Conjunctiva/sclera: Conjunctivae normal.     Comments: Left eye is missing  Cardiovascular:     Rate and Rhythm: Regular rhythm. Tachycardia present.     Heart sounds: Normal heart sounds.  Pulmonary:     Effort: Pulmonary effort is normal. No tachypnea or respiratory distress.     Breath sounds: Normal breath sounds.  Abdominal:     General: There is no distension.     Palpations: Abdomen is soft.     Tenderness: There is no abdominal tenderness. There is no guarding or rebound.  Musculoskeletal:     Cervical back: Normal range of motion and neck supple.  Skin:    General: Skin is warm and dry.     Comments: HOT to touch  Neurological:     Mental Status: He is disoriented and confused.  Psychiatric:        Behavior: Behavior normal.     (all labs ordered are listed, but only abnormal results are displayed) Labs Reviewed  COMPREHENSIVE METABOLIC PANEL WITH GFR - Abnormal; Notable for the following components:      Result Value   Glucose, Bld 109 (*)    BUN 24 (*)    Creatinine, Ser 1.27 (*)    Total Protein 8.2 (*)    GFR, Estimated 56 (*)    All other components within normal limits  URINALYSIS, W/ REFLEX TO CULTURE (INFECTION SUSPECTED) - Abnormal; Notable for the following components:   APPearance CLOUDY (*)    Hgb urine dipstick SMALL (*)    Protein, ur 30 (*)    Nitrite POSITIVE (*)    Leukocytes,Ua SMALL (*)    Bacteria, UA MANY (*)    All other components within normal limits  COMPREHENSIVE METABOLIC PANEL WITH GFR - Abnormal; Notable for the following components:   CO2 21 (*)    Glucose, Bld  108 (*)    Creatinine, Ser 1.25 (*)    Calcium 8.5 (*)    Total Protein 6.3 (*)    Albumin 2.8 (*)    GFR, Estimated 57 (*)    All other components within normal limits  HEMOGLOBIN A1C - Abnormal; Notable for the following components:   Hgb A1c MFr Bld 5.7 (*)    All other components within normal limits  GLUCOSE, CAPILLARY - Abnormal; Notable for the following components:   Glucose-Capillary 113 (*)    All other components within normal limits  GLUCOSE, CAPILLARY - Abnormal; Notable for the following components:   Glucose-Capillary 123 (*)    All other components within normal limits  CBG MONITORING, ED - Abnormal; Notable for the following components:   Glucose-Capillary 143 (*)    All other components within normal limits  CULTURE, BLOOD (ROUTINE X 2)  CULTURE, BLOOD (ROUTINE X 2)  RESP PANEL BY RT-PCR (RSV, FLU A&B, COVID)  [id number]  URINE CULTURE  CBC  LACTIC ACID, PLASMA  LACTIC ACID, PLASMA  PROTIME-INR  CBC  TSH  MAGNESIUM   GLUCOSE, CAPILLARY  GLUCOSE, CAPILLARY  GLUCOSE, CAPILLARY    EKG: EKG Interpretation Date/Time:  Tuesday September 07 2024 01:40:36 EDT Ventricular Rate:  36 PR Interval:  264 QRS Duration:  89 QT Interval:  489 QTC Calculation: 379 R Axis:   52  Text Interpretation: Sinus bradycardia Atrial premature complexes in couplets Prolonged PR interval When compared with ECG of 09/06/2024, Premature atrial complexes are now present Confirmed by Raford Lenis ([id number]) on 09/07/2024 11:17:35 AM  Radiology: No results found.   Procedures   Medications Ordered in the ED  aspirin  EC tablet 81 mg (81 mg Oral Patient Refused/Not Given 09/09/24 0907)  magnesium  oxide (MAG-OX) tablet 200 mg (200 mg Oral Patient Refused/Not Given 09/09/24 0906)  mirtazapine  (REMERON ) tablet 15 mg (15 mg Oral Given 09/08/24 2105)  heparin  injection 5,000 Units (5,000 Units Subcutaneous Given 09/09/24 0907)  acetaminophen  (TYLENOL ) tablet 650 mg (has no administration in  time range)    Or  acetaminophen  (TYLENOL ) suppository 650 mg (has no administration in time range)  hydrALAZINE (APRESOLINE) injection 10 mg (has no administration in time range)  cefTRIAXone  (ROCEPHIN ) 2 g in sodium chloride  0.9 % 100 mL IVPB (2 g Intravenous New Bag/Given 09/09/24 1522)  feeding supplement (ENSURE PLUS HIGH PROTEIN) liquid 237 mL (237 mLs Oral Patient Refused/Not Given 09/09/24 1345)  QUEtiapine  (SEROQUEL ) tablet 25 mg (25 mg  Oral Given 09/08/24 2104)  insulin  aspart (novoLOG ) injection 0-9 Units ( Subcutaneous Not Given 09/09/24 1230)  pantoprazole  (PROTONIX ) EC tablet 40 mg (40 mg Oral Not Given 09/09/24 1230)  sodium chloride  0.9 % bolus 1,000 mL (0 mLs Intravenous Stopped 09/06/24 2228)  cefTRIAXone  (ROCEPHIN ) 2 g in sodium chloride  0.9 % 100 mL IVPB (0 g Intravenous Stopped 09/07/24 0027)    Clinical Course as of 09/09/24 1544  Mon Sep 06, 2024  2208 Patient apparently missed the male purewick- awaiting urine collection-condom cath in place  [AH]  2224 Patient's family updated at bedside about findings.  No acute findings at this time.  Still awaiting urinalysis.  Wife states that his mental status is the same. [AH]    Clinical Course User Index [AH] Arloa Chroman, PA-C                                 Medical Decision Making Amount and/or Complexity of Data Reviewed Labs: ordered. Radiology: ordered.  Risk Decision regarding hospitalization.   This patient presents to the ED for concern of altered mental status, this involves an extensive number of treatment options, and is a complaint that carries with it a high risk of complications and morbidity.  The differential diagnosis for generalized abdominal pain includes, but is not limited to AAA, gastroenteritis, appendicitis, Bowel obstruction, Bowel perforation. Gastroparesis, DKA, Hernia, Inflammatory bowel disease, mesenteric ischemia, pancreatitis, peritonitis SBP, volvulus.   Co morbidities:   has a  past medical history of Arthritis, Attention deficit disorder, Blind left eye, Diabetes mellitus without complication (HCC), Hyperlipidemia, and Hypertension.   Social Determinants of Health:   SDOH Screenings   Food Insecurity: No Food Insecurity (09/07/2024)  Housing: Low Risk  (09/07/2024)  Transportation Needs: No Transportation Needs (09/07/2024)  Utilities: Not At Risk (09/07/2024)  Social Connections: Moderately Integrated (09/07/2024)  Tobacco Use: Medium Risk (09/06/2024)     Additional history:  {Additional history obtained from family at bedside   Lab Tests:  I Ordered, and personally interpreted labs.  The pertinent results include:   Urine is pending CBC without elevated white blood cell count, CMP with slightly elevated but baseline creatinine for patient respiratory panel negative  Imaging Studies:  I ordered imaging studies including CT head and 1 view chest x-ray I independently visualized and interpreted imaging which showed no acute findings I agree with the radiologist interpretation  Cardiac Monitoring/ECG:  The patient was maintained on a cardiac monitor.  I personally viewed and interpreted the cardiac monitored which showed an underlying rhythm of:      Test Considered:   I considered MRI for stroke however patient has no acute neurologic deficits  Critical Interventions:    Consultations Obtained:   Problem List / ED Course:     ICD-10-CM   1. Altered mental status, unspecified altered mental status type  R41.82     2. Dementia, unspecified dementia severity, unspecified dementia type, unspecified whether behavioral, psychotic, or mood disturbance or anxiety (HCC)  F03.90     3. Acute cystitis without hematuria  N30.00       MDM:    Patient here with altered mental status.  Currently awaiting urinalysis.  Patient signout given to PA [place] at shift change     Final diagnoses:  Altered mental status, unspecified altered  mental status type  Dementia, unspecified dementia severity, unspecified dementia type, unspecified whether behavioral, psychotic, or mood disturbance or anxiety (HCC)  Acute cystitis without hematuria    ED Discharge Orders     None          Arloa Chroman, PA-C 09/09/24 1544    Freddi Hamilton, MD 09/16/24 [id number]

## 2024-09-07 DIAGNOSIS — I480 Paroxysmal atrial fibrillation: Secondary | ICD-10-CM | POA: Diagnosis not present

## 2024-09-07 DIAGNOSIS — Z7982 Long term (current) use of aspirin: Secondary | ICD-10-CM | POA: Diagnosis not present

## 2024-09-07 DIAGNOSIS — Z86718 Personal history of other venous thrombosis and embolism: Secondary | ICD-10-CM | POA: Diagnosis not present

## 2024-09-07 DIAGNOSIS — H5462 Unqualified visual loss, left eye, normal vision right eye: Secondary | ICD-10-CM | POA: Diagnosis present

## 2024-09-07 DIAGNOSIS — E119 Type 2 diabetes mellitus without complications: Secondary | ICD-10-CM | POA: Diagnosis not present

## 2024-09-07 DIAGNOSIS — Z87891 Personal history of nicotine dependence: Secondary | ICD-10-CM | POA: Diagnosis not present

## 2024-09-07 DIAGNOSIS — R001 Bradycardia, unspecified: Secondary | ICD-10-CM | POA: Diagnosis not present

## 2024-09-07 DIAGNOSIS — N3000 Acute cystitis without hematuria: Secondary | ICD-10-CM | POA: Diagnosis not present

## 2024-09-07 DIAGNOSIS — I10 Essential (primary) hypertension: Secondary | ICD-10-CM | POA: Diagnosis not present

## 2024-09-07 DIAGNOSIS — F0390 Unspecified dementia without behavioral disturbance: Secondary | ICD-10-CM | POA: Diagnosis not present

## 2024-09-07 DIAGNOSIS — R4182 Altered mental status, unspecified: Secondary | ICD-10-CM | POA: Diagnosis not present

## 2024-09-07 DIAGNOSIS — F03918 Unspecified dementia, unspecified severity, with other behavioral disturbance: Secondary | ICD-10-CM | POA: Diagnosis not present

## 2024-09-07 DIAGNOSIS — E782 Mixed hyperlipidemia: Secondary | ICD-10-CM | POA: Diagnosis not present

## 2024-09-07 DIAGNOSIS — I442 Atrioventricular block, complete: Secondary | ICD-10-CM | POA: Diagnosis not present

## 2024-09-07 DIAGNOSIS — Z79899 Other long term (current) drug therapy: Secondary | ICD-10-CM | POA: Diagnosis not present

## 2024-09-07 DIAGNOSIS — Z96642 Presence of left artificial hip joint: Secondary | ICD-10-CM | POA: Diagnosis not present

## 2024-09-07 DIAGNOSIS — Z950 Presence of cardiac pacemaker: Secondary | ICD-10-CM | POA: Diagnosis not present

## 2024-09-07 DIAGNOSIS — Z8249 Family history of ischemic heart disease and other diseases of the circulatory system: Secondary | ICD-10-CM | POA: Diagnosis not present

## 2024-09-07 DIAGNOSIS — E785 Hyperlipidemia, unspecified: Secondary | ICD-10-CM | POA: Diagnosis not present

## 2024-09-07 DIAGNOSIS — Z006 Encounter for examination for normal comparison and control in clinical research program: Secondary | ICD-10-CM | POA: Diagnosis not present

## 2024-09-07 DIAGNOSIS — K219 Gastro-esophageal reflux disease without esophagitis: Secondary | ICD-10-CM | POA: Diagnosis present

## 2024-09-07 MED ORDER — QUETIAPINE FUMARATE 25 MG PO TABS
25.0000 mg | ORAL_TABLET | Freq: Every day | ORAL | Status: DC
Start: 2024-09-07 — End: 2024-09-07

## 2024-09-07 MED ORDER — MIRTAZAPINE 15 MG PO TABS
15.0000 mg | ORAL_TABLET | Freq: Every day | ORAL | Status: DC
  Administered 2024-09-07 – 2024-09-10 (×4): 15 mg via ORAL
  Filled 2024-09-07 (×4): qty 1

## 2024-09-07 MED ORDER — ENSURE PLUS HIGH PROTEIN PO LIQD
237.0000 mL | Freq: Two times a day (BID) | ORAL | Status: DC
  Administered 2024-09-07 – 2024-09-11 (×5): 237 mL via ORAL

## 2024-09-07 MED ORDER — PANTOPRAZOLE SODIUM 40 MG IV SOLR
40.0000 mg | Freq: Two times a day (BID) | INTRAVENOUS | Status: DC
  Administered 2024-09-07 – 2024-09-09 (×5): 40 mg via INTRAVENOUS
  Filled 2024-09-07 (×5): qty 10

## 2024-09-07 MED ORDER — ASPIRIN 81 MG PO TBEC
81.0000 mg | DELAYED_RELEASE_TABLET | Freq: Every day | ORAL | Status: DC
  Administered 2024-09-07 – 2024-09-11 (×4): 81 mg via ORAL
  Filled 2024-09-07 (×5): qty 1

## 2024-09-07 MED ORDER — MAGNESIUM OXIDE -MG SUPPLEMENT 400 (240 MG) MG PO TABS
200.0000 mg | ORAL_TABLET | Freq: Two times a day (BID) | ORAL | Status: DC
  Administered 2024-09-07 – 2024-09-11 (×7): 200 mg via ORAL
  Filled 2024-09-07 (×8): qty 1

## 2024-09-07 MED ORDER — QUETIAPINE FUMARATE 25 MG PO TABS
25.0000 mg | ORAL_TABLET | Freq: Every day | ORAL | Status: DC
Start: 2024-09-07 — End: 2024-09-11
  Administered 2024-09-07 – 2024-09-10 (×4): 25 mg via ORAL
  Filled 2024-09-07 (×5): qty 1

## 2024-09-07 MED ORDER — PANTOPRAZOLE SODIUM 40 MG PO TBEC
40.0000 mg | DELAYED_RELEASE_TABLET | Freq: Every day | ORAL | Status: DC
  Administered 2024-09-07: 40 mg via ORAL
  Filled 2024-09-07: qty 1

## 2024-09-07 MED ORDER — DONEPEZIL HCL 5 MG PO TABS
5.0000 mg | ORAL_TABLET | Freq: Every day | ORAL | Status: DC
Start: 2024-09-07 — End: 2024-09-08
  Administered 2024-09-07: 5 mg via ORAL
  Filled 2024-09-07: qty 1

## 2024-09-07 MED ORDER — SODIUM CHLORIDE 0.9 % IV SOLN
2.0000 g | INTRAVENOUS | Status: DC
  Administered 2024-09-07 – 2024-09-11 (×5): 2 g via INTRAVENOUS
  Filled 2024-09-07 (×5): qty 20

## 2024-09-07 NOTE — ED Notes (Addendum)
 Pt. Dry, brief changed. pt. in gown. Pt. On monitor. Pt. Vitals updated. Pt. Belongings at beside in pt. Belonging bag. Blood noted in brief that seemed to be coming from urethra

## 2024-09-07 NOTE — Plan of Care (Signed)

## 2024-09-07 NOTE — ED Provider Notes (Signed)
 Notified by nursing patient having heart rates down to the low 30s. Patient is awaiting admission for altered mental status and UTI. On assessment at the bedside patient is awake, alert and has no acute complaints, heart rate is in the 30s. Telemetry reviewed, patient with intermittent Mobitz type I and II block. This appears to be rapidly changing. On record review he has had a history of similar episode in the past. Updated hospitalist regarding this finding.   Griselda Norris, MD 09/07/24 [phone]

## 2024-09-07 NOTE — ED Notes (Signed)
 Pt departed ER with [facility] at this time. Pt's wife, Elveria, aware of transfer and room # at [facility].

## 2024-09-07 NOTE — H&P (Signed)
 History and Physical    Patient: [name] FMW:[id number] DOB: 01-09-1941 DOA: 09/06/2024 DOS: the patient was seen and examined on 09/07/2024 . PCP: Cleotilde Planas, MD  Patient coming from: [facility] Chief complaint: Chief Complaint  Patient presents with   Altered Mental Status   HPI:  [name] is a 83 y.o. male with past medical history  of complete heart block, PAF, diabetes mellitus type 2, GERD, septic joint history, DVT history, history of ABLA, history of AKI, mixed type,Anemia, mixed hyperlipidemia brought from [facility] for disorientation attributed to urinary tract infection, head CT negative for any acute findings, abnormal urinalysis as shown below.  ED Course:  Vital signs in the ED were notable for the following:  Vitals:   09/07/24 1200 09/07/24 1300 09/07/24 1358 09/07/24 1400  BP: 127/64 (!) 133/55 137/76   Pulse: (!) 48 (!) 55 (!) 55   Temp:  98.9 F (37.2 C) 98.5 F (36.9 C)   Resp: 17 20    Height:    5' 9 (1.753 m)  Weight:    67.3 kg  SpO2: 98% 98%    TempSrc:  Oral Oral   BMI (Calculated):    21.9   >>ED evaluation thus far shows: CMP yesterday showed AKI of 1.27 BUN of 24 eGFR 56 normal electrolytes and LFTs otherwise. Lactic acid normal at 1.3 repeat at 1.4. Normal white count. Viral panel negative for flu RSV and COVID. Abnormal urinalysis more than 50 WBCs positive nitrite cloudy urine.  Head CT shows chronic atrophic and ischemic changes no acute infarct. Chest x-ray done yesterday was negative for any acute cardiopulmonary abnormalities. EKG shows sinus rhythm at 68 prolonged PR at 230 QTc 405.  >>While in the ED patient received the following: Medications  aspirin  EC tablet 81 mg (81 mg Oral Given 09/07/24 1155)  donepezil (ARICEPT) tablet 5 mg (has no administration in time range)  magnesium  oxide (MAG-OX) tablet 200 mg (200 mg Oral Not Given 09/07/24 1227)  mirtazapine  (REMERON ) tablet 15 mg (has no  administration in time range)  pantoprazole  (PROTONIX ) EC tablet 40 mg (40 mg Oral Given 09/07/24 1155)  QUEtiapine  (SEROQUEL ) tablet 25 mg (has no administration in time range)  sodium chloride  0.9 % bolus 1,000 mL (0 mLs Intravenous Stopped 09/06/24 2228)  cefTRIAXone  (ROCEPHIN ) 2 g in sodium chloride  0.9 % 100 mL IVPB (0 g Intravenous Stopped 09/07/24 0027)   Review of Systems  Unable to perform ROS: Mental status change   Past Medical History:  Diagnosis Date   Arthritis    Attention deficit disorder    Blind left eye    Diabetes mellitus without complication (HCC)    Hyperlipidemia    Hypertension    Past Surgical History:  Procedure Laterality Date   EYE SURGERY Left    x3   KNEE ARTHROSCOPY Bilateral 03/29/2014   Procedure: RIGHT KNEE ARTHROSCOPY AND DEBRIDEMENT, LEFT KNEE ASPIRATION,  LEFT KNEE ARTHROSCOPY AND DEBRIDEMENT.;  Surgeon: Cordella Glendia Hutchinson, MD;  Location: [facility] OR;  Service: Orthopedics;  Laterality: Bilateral;  RIGHT KNEE ARTHROSCOPY AND DEBRIDEMENT, LEFT KNEE ASPIRATION,  LEFT KNEE ARTHROSCOPY AND DEBRIDEMENT.   KNEE ARTHROTOMY Bilateral 04/26/2014   Procedure: KNEE ARTHROTOMY, SYNOVECTOMY, ANTIBIOTIC BEAD PLACEMENT.;  Surgeon: Cordella Glendia Hutchinson, MD;  Location: [facility] OR;  Service: Orthopedics;  Laterality: Bilateral;   lipoma removal, right upper back     TONSILLECTOMY     TOTAL HIP ARTHROPLASTY Left 09/14/2019   TOTAL HIP ARTHROPLASTY Left  09/14/2019   Procedure: LEFT TOTAL HIP ARTHROPLASTY ANTERIOR APPROACH;  Surgeon: Addie Cordella Hamilton, MD;  Location: [facility] OR;  Service: Orthopedics;  Laterality: Left;    reports that he has quit smoking. He has never used smokeless tobacco. He reports current alcohol use. He reports that he does not use drugs. No Known Allergies Family History  Problem Relation Age of Onset   Heart attack Father    Heart attack Sister    Heart attack Brother    Prior to Admission medications   Medication Sig Start Date End Date Taking?  Authorizing Provider  aspirin  EC 81 MG tablet Take 1 tablet (81 mg total) by mouth daily. Swallow whole. 06/21/22   Claudene Victory ORN, MD  donepezil (ARICEPT) 5 MG tablet Take 5 mg by mouth at bedtime. 09/11/22   [provider]  magnesium  oxide (MAG-OX) 400 (240 Mg) MG tablet Take 0.5 tablets (200 mg total) by mouth 2 (two) times daily. 07/07/22   Regalado, Belkys A, MD  mirtazapine  (REMERON ) 15 MG tablet Take 15 mg by mouth at bedtime. 05/17/22   [provider]  omeprazole (PRILOSEC) 20 MG capsule Take 20 mg by mouth 2 (two) times daily before a meal. 04/19/22   [provider]  QUEtiapine  (SEROQUEL ) 25 MG tablet Take 1 tablet (25 mg total) by mouth at bedtime. 07/07/22   Regalado, Belkys A, MD                                                                                 Vitals:   09/07/24 1200 09/07/24 1300 09/07/24 1358 09/07/24 1400  BP: 127/64 (!) 133/55 137/76   Pulse: (!) 48 (!) 55 (!) 55   Resp: 17 20    Temp:  98.9 F (37.2 C) 98.5 F (36.9 C)   TempSrc:  Oral Oral   SpO2: 98% 98%    Weight:    67.3 kg  Height:    5' 9 (1.753 m)   Physical Exam Vitals reviewed.  Constitutional:      General: He is not in acute distress.    Appearance: He is not ill-appearing.  HENT:     Head: Normocephalic.  Eyes:     Extraocular Movements: Extraocular movements intact.  Cardiovascular:     Rate and Rhythm: Bradycardia present. Rhythm irregular.     Heart sounds: Normal heart sounds.  Pulmonary:     Breath sounds: Normal breath sounds.  Abdominal:     General: There is no distension.     Palpations: Abdomen is soft.     Tenderness: There is no abdominal tenderness.  Neurological:     General: No focal deficit present.     Mental Status: He is alert. He is disoriented.     Labs on Admission: I have personally reviewed following labs and imaging studies CBC: Recent Labs  Lab 09/06/24 1702  WBC 6.9  HGB 14.7  HCT 45.5  MCV 89.7  PLT 292   Basic  Metabolic Panel: Recent Labs  Lab 09/06/24 1702  NA 140  K 4.4  CL 103  CO2 25  GLUCOSE 109*  BUN 24*  CREATININE 1.27*  CALCIUM 9.4   GFR:  Estimated Creatinine Clearance: 42.7 mL/min (A) (by C-G formula based on SCr of 1.27 mg/dL (H)). Liver Function Tests: Recent Labs  Lab 09/06/24 1702  AST 23  ALT 7  ALKPHOS 65  BILITOT 0.4  PROT 8.2*  ALBUMIN 4.3   No results for input(s): LIPASE, AMYLASE in the last 168 hours. No results for input(s): AMMONIA in the last 168 hours. Recent Labs    09/06/24 1702  BUN 24*  CREATININE 1.27*    Cardiac Enzymes: No results for input(s): CKTOTAL, CKMB, CKMBINDEX, TROPONINI in the last 168 hours. BNP (last 3 results) No results for input(s): PROBNP in the last 8760 hours. HbA1C: No results for input(s): HGBA1C in the last 72 hours. CBG: Recent Labs  Lab 09/06/24 1712  GLUCAP 143*   Lipid Profile: No results for input(s): CHOL, HDL, LDLCALC, TRIG, CHOLHDL, LDLDIRECT in the last 72 hours. Thyroid  Function Tests: No results for input(s): TSH, T4TOTAL, FREET4, T3FREE, THYROIDAB in the last 72 hours. Anemia Panel: No results for input(s): VITAMINB12, FOLATE, FERRITIN, TIBC, IRON , RETICCTPCT in the last 72 hours. Urine analysis:    Component Value Date/Time   COLORURINE YELLOW 09/06/2024 2310   APPEARANCEUR CLOUDY (A) 09/06/2024 2310   LABSPEC >=1.030 09/06/2024 2310   PHURINE 6.0 09/06/2024 2310   GLUCOSEU NEGATIVE 09/06/2024 2310   HGBUR SMALL (A) 09/06/2024 2310   BILIRUBINUR NEGATIVE 09/06/2024 2310   KETONESUR NEGATIVE 09/06/2024 2310   PROTEINUR 30 (A) 09/06/2024 2310   UROBILINOGEN 0.2 04/29/2014 1111   NITRITE POSITIVE (A) 09/06/2024 2310   LEUKOCYTESUR SMALL (A) 09/06/2024 2310   Radiological Exams on Admission: CT Head Wo Contrast Result Date: 09/06/2024 CLINICAL DATA:  Altered mental status EXAM: CT HEAD WITHOUT CONTRAST TECHNIQUE: Contiguous axial images  were obtained from the base of the skull through the vertex without intravenous contrast. RADIATION DOSE REDUCTION: This exam was performed according to the departmental dose-optimization program which includes automated exposure control, adjustment of the mA and/or kV according to patient size and/or use of iterative reconstruction technique. COMPARISON:  10/12/2019 FINDINGS: Brain: No evidence of acute infarction, hemorrhage, hydrocephalus, extra-axial collection or mass lesion/mass effect. Chronic atrophic and ischemic changes are noted. Vascular: No hyperdense vessel or unexpected calcification. Skull: Normal. Negative for fracture or focal lesion. Sinuses/Orbits: Paranasal sinuses are within normal limits. 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 Other: None IMPRESSION: Chronic atrophic and ischemic changes similar to that seen on the prior exam. No acute infarct is noted. Electronically Signed   By: Oneil Devonshire M.D.   On: 09/06/2024 20:25   DG Chest Port 1 View Result Date: 09/06/2024 CLINICAL DATA:  Questionable sepsis - evaluate for abnormality EXAM: PORTABLE CHEST - 1 VIEW COMPARISON:  07/16/2022 FINDINGS: No focal airspace consolidation, pleural effusion, or pneumothorax. No cardiomegaly. Tortuous aorta with aortic atherosclerosis. No acute fracture or destructive lesions. Multilevel thoracic osteophytosis. Changes suggestive of chronic rotator cuff tears bilaterally. IMPRESSION: No acute cardiopulmonary abnormality. Electronically Signed   By: Rogelia Myers M.D.   On: 09/06/2024 18:55   Data Reviewed: Relevant notes from primary care and specialist visits, past discharge summaries as available in EHR, including Care Everywhere . Prior diagnostic testing as pertinent to current admission diagnoses, Updated medications and problem lists for reconciliation .ED course, including vitals, labs, imaging, treatment and response to treatment,Triage notes, nursing and pharmacy notes and ED provider's notes.Notable  results as noted in HPI.Discussed case with EDMD/ ED APP/ or Specialty MD on call and as needed.  Assessment & Plan   >> Disorientation/UTI:  Admitted for treatment with Rocephin . Follow culture sensitivity. As needed bladder scan as needed. Neurochecks, will consider MRI of the brain noncontrast.   >> Diabetes mellitus type 2: Glycemic protocol bedside swallow eval as needed and carb consistent diet.  Hold metformin . No antihyperglycemic agents and med list.   >> Paroxysmal atrial fibrillation/ Bradycardia/ first degree heart block: Currently rate is regular sinus rhythm. Med rec is spending.  Cardiology consulted.      >> Essential hypertension: Blood pressure has been fluctuating since yesterday. Vitals:   09/07/24 0246 09/07/24 0300 09/07/24 0400 09/07/24 0500  BP: 133/68 (!) 117/47 (!) 130/47 111/60   09/07/24 0613 09/07/24 0615 09/07/24 0700 09/07/24 0800  BP: (!) 155/76 (!) 155/76 (!) 142/51 (!) 143/83   09/07/24 0900 09/07/24 1200 09/07/24 1300 09/07/24 1358  BP: 132/62 127/64 (!) 133/55 137/76  No antihypertensive meds in chart.  We will wait for med rec.  Hold lisinopril .    >> Dementia: Will continue patient's Seroquel , Aricept, Remeron .   DVT prophylaxis:  Eliquis  Consults:  None  Advance Care Planning:    Code Status: Prior   Family Communication:  None Disposition Plan:  Home Severity of Illness: The appropriate patient status for this patient is OBSERVATION. Observation status is judged to be reasonable and necessary in order to provide the required intensity of service to ensure the patient's safety. The patient's presenting symptoms, physical exam findings, and initial radiographic and laboratory data in the context of their medical condition is felt to place them at decreased risk for further clinical deterioration. Furthermore, it is anticipated that the patient will be medically stable for discharge from the hospital within 2 midnights of  admission.   Unresulted Labs (From admission, onward)     Start     Ordered   09/06/24 1827  Blood Culture (routine x 2)  (Undifferentiated presentation (screening labs and basic nursing orders))  BLOOD CULTURE X 2,   STAT      09/06/24 1831   Signed and Held  Comprehensive metabolic panel  Tomorrow morning,   R        Signed and Held   Signed and Held  CBC  Tomorrow morning,   R        Signed and Held            Meds ordered this encounter  Medications   sodium chloride  0.9 % bolus 1,000 mL   cefTRIAXone  (ROCEPHIN ) 2 g in sodium chloride  0.9 % 100 mL IVPB    Antibiotic Indication::   UTI   aspirin  EC tablet 81 mg   donepezil (ARICEPT) tablet 5 mg   magnesium  oxide (MAG-OX) tablet 200 mg   mirtazapine  (REMERON ) tablet 15 mg   DISCONTD: pantoprazole  (PROTONIX ) EC tablet 40 mg   QUEtiapine  (SEROQUEL ) tablet 25 mg   pantoprazole  (PROTONIX ) injection 40 mg   cefTRIAXone  (ROCEPHIN ) 2 g in sodium chloride  0.9 % 100 mL IVPB    Antibiotic Indication::   UTI   feeding supplement (ENSURE PLUS HIGH PROTEIN) liquid 237 mL     Orders Placed This Encounter  Procedures   Blood Culture (routine x 2)   Resp panel by RT-PCR (RSV, Flu A&B, Covid) Anterior Nasal Swab   DG Chest Port 1 View   CT Head Wo Contrast   Comprehensive metabolic panel   CBC   Lactic acid, plasma   Protime-INR   Urinalysis, w/ Reflex to Culture (Infection Suspected) -Urine, Clean Catch   Diet regular Room service appropriate? Yes;  Fluid consistency: Thin   Document Height and Actual Weight   Assess and Document Glasgow Coma Scale   Document vital signs within 1-hour of fluid bolus completion.  Notify provider of abnormal vital signs despite fluid resuscitation.   Refer to Sidebar Report: Sepsis Bundle ED/IP   Notify provider for difficulties obtaining IV access   Initiate Carrier Fluid Protocol   Re-check Vital Signs   Bladder scan   In and Out Cath   Cardiac Monitoring Continuous x 24 hours Indications for  use: Other; other indications for use: High risk   Cardiac Monitoring Continuous x 48 hours Indications for use: Other; Other indications for use: High risk   Consult to hospitalist   Consult to Transition of Care   CBG monitoring, ED   ED EKG   EKG 12-Lead   EKG 12-Lead   EKG   EKG   Insert peripheral IV X 1   Place in observation (patient's expected length of stay will be less than 2 midnights)   Admit to Inpatient (patient's expected length of stay will be greater than 2 midnights or inpatient only procedure)    Author: Mario LULLA Blanch, MD 12 pm- 8 pm. [facility]. 09/07/2024 3:02 PM Please note for any communication after hours contact [facility] Assigned provider on call on Amion.

## 2024-09-07 NOTE — ED Provider Notes (Signed)
 Patient signed out to myself at time of shift change by Lavanda Lesches PA-C, briefly patient is an 83 year old male who presents to the emergency department with a chief complaint of altered mental status.  Patient has a history of dementia at baseline however for 1 day he has had increased confusion, calling out more than normal, as well as increased agitation.  Wife also states that patient has had increased urinary frequency.  History of UTIs in the past.   Vital signs in the emergency department stable, patient is afebrile.  At time of signout patient is currently pending a urine for which I placed an In-N-Out catheter order.  No elevated white blood cell count on CBC, hemoglobin appears stable, CMP significant for slightly elevated creatinine at 1.27, lactic acid of 1.4.  Urinalysis significant for cloudy appearance, small hemoglobin on urine dipstick, 30 protein, positive nitrite, small leukocyte, many bacteria, greater than 50 white blood cells.  Blood cultures pending at this time.  Respiratory panel negative.  CT head negative for acute abnormality, chest x-ray without acute cardiopulmonary abnormality.  On reassessment patient remains agitated, discussed inpatient versus outpatient therapy with family who believes patient would be better off inpatient.  Wife who is at bedside states that she is his caregiver at home and is concerned about him being a fall risk with this increased agitation as well as newfound infection, also states that the patient is unable to swallow pills whole and that his pills must be crushed at home.  Wife would prefer inpatient stay.  Consult to hospitalist made for urinary tract infection in elderly 83 year old male with past medical history of dementia with acute worsening of altered mental status. Spoke with hospitalist Dr. Shona who agrees with admission for ongoing diagnosis and treatment. Given abx for suspected infection.      Janetta Terrall FALCON, PA-C 09/07/24  [phone]    Freddi Hamilton, MD 09/07/24 [phone]

## 2024-09-07 NOTE — ED Notes (Signed)
 Received call from [facility]. Bed is ready and Truck is being sent per [facility] @12 :37

## 2024-09-07 NOTE — ED Notes (Signed)
 Patient provided freezer meal and juice.

## 2024-09-07 NOTE — Consult Note (Signed)
 Cardiology Consultation   Patient ID: [name] MRN: [id number]; DOB: 30-Jun-1941  Admit date: 09/06/2024 Date of Consult: 09/07/2024  PCP:  Brandon Planas, MD   [facility] Providers Cardiologist:  [name] Brandon Kasten, MD (Inactive)  Electrophysiologist:  Brandon ONEIDA HOLTS, MD       Patient Profile: [name] is Mccormick 83 y.o. male with Mccormick hx of Wenckebach, PAF (not on OAC due to epistaxis), HLD, DM2, prior DVT, and dementia who is being seen 09/07/2024 for the evaluation of bradycardia at the request of Dr. Mario Mccormick.  History of Present Illness:  The patient originally presented yesterday to the [facility] ED due to concerns for having Mccormick UTI.  His wife reported that the patient was having increased urinary frequency and confusion for 1 day.  Urinalysis demonstrated positive nitrates and leukocytes and he was started on ceftriaxone  for empiric treatment.  Additional labs were notable for creatinine 1.27, normal WBC, normal lactate, and blood cultures are pending.  Head CT was negative for acute intracranial process.  ECG showed marked sinus bradycardia and PACs.  The patient was previously hospitalized in 2023 due to concerns for CHB.  EP evaluated the patient at that time and favored that his bradycardia was due to Wenckebach.  He has since been followed by cardiology including EP last seen 1 year ago without issue.  There was consideration for Mccormick Watchman device for stroke prophylaxis but the patient declined.  Additionally, the patient states that he is asymptomatic.  He denies chest pain, SOB, presyncope, syncope, palpitations, PND, orthopnea and swelling.  He is accompanied at bedside by his great granddaughter who says that  he always has Mccormick slow heart rate.  No other concerns.    Past Medical History:  Diagnosis Date   Arthritis    Attention deficit disorder    Blind left eye    Diabetes mellitus without complication (HCC)    Hyperlipidemia    Hypertension     Past  Surgical History:  Procedure Laterality Date   EYE SURGERY Left    x3   KNEE ARTHROSCOPY Bilateral 03/29/2014   Procedure: RIGHT KNEE ARTHROSCOPY AND DEBRIDEMENT, LEFT KNEE ASPIRATION,  LEFT KNEE ARTHROSCOPY AND DEBRIDEMENT.;  Surgeon: [name] Hutchinson, MD;  Location: [facility] OR;  Service: Orthopedics;  Laterality: Bilateral;  RIGHT KNEE ARTHROSCOPY AND DEBRIDEMENT, LEFT KNEE ASPIRATION,  LEFT KNEE ARTHROSCOPY AND DEBRIDEMENT.   KNEE ARTHROTOMY Bilateral 04/26/2014   Procedure: KNEE ARTHROTOMY, SYNOVECTOMY, ANTIBIOTIC BEAD PLACEMENT.;  Surgeon: [name] Hutchinson, MD;  Location: [facility] OR;  Service: Orthopedics;  Laterality: Bilateral;   lipoma removal, right upper back     TONSILLECTOMY     TOTAL HIP ARTHROPLASTY Left 09/14/2019   TOTAL HIP ARTHROPLASTY Left 09/14/2019   Procedure: LEFT TOTAL HIP ARTHROPLASTY ANTERIOR APPROACH;  Surgeon: Mccormick Brandon Glendia, MD;  Location: [facility] OR;  Service: Orthopedics;  Laterality: Left;     Home Medications:  Prior to Admission medications   Medication Sig Start Date End Date Taking? Authorizing Provider  aspirin  EC 81 MG tablet Take 1 tablet (81 mg total) by mouth daily. Swallow whole. 06/21/22   Brandon Brandon LELON, MD  donepezil (ARICEPT) 5 MG tablet Take 5 mg by mouth at bedtime. 09/11/22   [provider]  magnesium  oxide (MAG-OX) 400 (240 Mg) MG tablet Take 0.5 tablets (200 mg total) by mouth 2 (two) times daily. 07/07/22   Mccormick, Brandon A, MD  mirtazapine  (REMERON ) 15 MG tablet Take 15 mg by mouth  at bedtime. 05/17/22   [provider]  omeprazole (PRILOSEC) 20 MG capsule Take 20 mg by mouth 2 (two) times daily before Mccormick meal. 04/19/22   [provider]  QUEtiapine  (SEROQUEL ) 25 MG tablet Take 1 tablet (25 mg total) by mouth at bedtime. 07/07/22   Mccormick, Owen A, MD    Scheduled Meds:  aspirin  EC  81 mg Oral Daily   donepezil  5 mg Oral QHS   feeding supplement  237 mL Oral BID BM   magnesium  oxide  200 mg Oral BID   mirtazapine   15  mg Oral QHS   pantoprazole  (PROTONIX ) IV  40 mg Intravenous Q12H   QUEtiapine   25 mg Oral QHS   Continuous Infusions:  cefTRIAXone  (ROCEPHIN )  IV 2 g (09/07/24 1452)   PRN Meds:   Allergies:   No Known Allergies  Social History:   Social History   Socioeconomic History   Marital status: Married    Spouse name: Not on file   Number of children: Not on file   Years of education: Not on file   Highest education level: Not on file  Occupational History   Not on file  Tobacco Use   Smoking status: Former   Smokeless tobacco: Never  Vaping Use   Vaping status: Never Used  Substance and Sexual Activity   Alcohol use: Yes    Comment: occasional    Drug use: No   Sexual activity: Not on file  Other Topics Concern   Not on file  Social History Narrative   Not on file   Social Drivers of Health   Financial Resource Strain: Not on file  Food Insecurity: No Food Insecurity (09/07/2024)   Hunger Vital Sign    Worried About Running Out of Food in the Last Year: Never true    Ran Out of Food in the Last Year: Never true  Transportation Needs: No Transportation Needs (09/07/2024)   PRAPARE - Administrator, Civil Service (Medical): No    Lack of Transportation (Non-Medical): No  Physical Activity: Not on file  Stress: Not on file  Social Connections: Moderately Integrated (09/07/2024)   Social Connection and Isolation Panel    Frequency of Communication with Friends and Family: More than three times Mccormick week    Frequency of Social Gatherings with Friends and Family: More than three times Mccormick week    Attends Religious Services: 1 to 4 times per year    Active Member of [employer] or Organizations: No    Attends Banker Meetings: Never    Marital Status: Married  Catering manager Violence: Patient Unable To Answer (09/07/2024)   Humiliation, Afraid, Rape, and Kick questionnaire    Fear of Current or Ex-Partner: Patient unable to answer    Emotionally Abused:  Patient unable to answer    Physically Abused: Patient unable to answer    Sexually Abused: Patient unable to answer    Family History:    Family History  Problem Relation Age of Onset   Heart attack Father    Heart attack Sister    Heart attack Brother      ROS:  Please see the history of present illness.   All other ROS reviewed and negative.     Physical Exam/Data: Vitals:   09/07/24 1300 09/07/24 1358 09/07/24 1400 09/07/24 1533  BP: (!) 133/55 137/76  (!) 119/59  Pulse: (!) 55 (!) 55  (!) 45  Resp: 20   20  Temp: 98.9 F (37.2 C) 98.5 F (36.9 C)    TempSrc: Oral Oral    SpO2: 98%   100%  Weight:   67.3 kg   Height:   5' 9 (1.753 m)     Intake/Output Summary (Last 24 hours) at 09/07/2024 1834 Last data filed at 09/07/2024 1535 Gross per 24 hour  Intake 1100.5 ml  Output 525 ml  Net 575.5 ml      09/07/2024    2:00 PM 12/02/2022    8:43 AM 10/28/2022    8:57 AM  Last 3 Weights  Weight (lbs) 148 lb 5.9 oz 158 lb 157 lb 3.2 oz  Weight (kg) 67.3 kg 71.668 kg 71.305 kg     Body mass index is 21.91 kg/m.  General:  Well nourished, well developed, in no acute distress HEENT: normal Neck: no JVD Vascular: No carotid bruits; Distal pulses 2+ bilaterally Cardiac:  normal S1, S2; RRR; no murmur  Lungs:  clear to auscultation bilaterally, no wheezing, rhonchi or rales  Abd: soft, nontender, no hepatomegaly  Ext: no edema Musculoskeletal:  No deformities, BUE and BLE strength normal and equal Skin: warm and dry  Neuro:  CNs 2-12 intact, no focal abnormalities noted Psych:  Normal affect   EKG:  The EKG was personally reviewed and demonstrates: Marked sinus bradycardia, occasional PACs Telemetry:  Telemetry was personally reviewed and demonstrates: Sinus bradycardia with multiple episodes of Wenckebach  Relevant CV Studies:   TTE 07/02/22:  IMPRESSIONS     1. Left ventricular ejection fraction, by estimation, is 60 to 65%. The  left ventricle has  normal function. The left ventricle has no regional  wall motion abnormalities. Left ventricular diastolic parameters were  normal.   2. Right ventricular systolic function is normal. The right ventricular  size is normal.   3. Left atrial size was moderately dilated.   4. The mitral valve is abnormal. Trivial mitral valve regurgitation. No  evidence of mitral stenosis.   5. The aortic valve is tricuspid. There is mild calcification of the  aortic valve. There is mild thickening of the aortic valve. Aortic valve  regurgitation is not visualized. Aortic valve sclerosis is present, with  no evidence of aortic valve stenosis.   6. The inferior vena cava is normal in size with greater than 50%  respiratory variability, suggesting right atrial pressure of 3 mmHg.   Event Monitor 07/02/22:    Basic rhythm is sinus   Nocturnal bradycardia with second-degree AV block intermittently.   No reported symptoms.  Laboratory Data: High Sensitivity Troponin:  No results for input(s): TROPONINIHS in the last 720 hours.   Chemistry Recent Labs  Lab 09/06/24 1702  NA 140  K 4.4  CL 103  CO2 25  GLUCOSE 109*  BUN 24*  CREATININE 1.27*  CALCIUM 9.4  GFRNONAA 56*  ANIONGAP 12    Recent Labs  Lab 09/06/24 1702  PROT 8.2*  ALBUMIN 4.3  AST 23  ALT 7  ALKPHOS 65  BILITOT 0.4   Lipids No results for input(s): CHOL, TRIG, HDL, LABVLDL, LDLCALC, CHOLHDL in the last 168 hours.  Hematology Recent Labs  Lab 09/06/24 1702  WBC 6.9  RBC 5.07  HGB 14.7  HCT 45.5  MCV 89.7  MCH 29.0  MCHC 32.3  RDW 13.0  PLT 292   Thyroid  No results for input(s): TSH, FREET4 in the last 168 hours.  BNPNo results for input(s): BNP, PROBNP in the last 168 hours.  DDimer No results  for input(s): DDIMER in the last 168 hours.  Radiology/Studies:  CT Head Wo Contrast Result Date: 09/06/2024 CLINICAL DATA:  Altered mental status EXAM: CT HEAD WITHOUT CONTRAST TECHNIQUE: Contiguous  axial images were obtained from the base of the skull through the vertex without intravenous contrast. RADIATION DOSE REDUCTION: This exam was performed according to the departmental dose-optimization program which includes automated exposure control, adjustment of the mA and/or kV according to patient size and/or use of iterative reconstruction technique. COMPARISON:  10/12/2019 FINDINGS: Brain: No evidence of acute infarction, hemorrhage, hydrocephalus, extra-axial collection or mass lesion/mass effect. Chronic atrophic and ischemic changes are noted. Vascular: No hyperdense vessel or unexpected calcification. Skull: Normal. Negative for fracture or focal lesion. Sinuses/Orbits: Paranasal sinuses are within normal limits. 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 Other: None IMPRESSION: Chronic atrophic and ischemic changes similar to that seen on the prior exam. No acute infarct is noted. Electronically Signed   By: Oneil Devonshire M.D.   On: 09/06/2024 20:25   DG Chest Port 1 View Result Date: 09/06/2024 CLINICAL DATA:  Questionable sepsis - evaluate for abnormality EXAM: PORTABLE CHEST - 1 VIEW COMPARISON:  07/16/2022 FINDINGS: No focal airspace consolidation, pleural effusion, or pneumothorax. No cardiomegaly. Tortuous aorta with aortic atherosclerosis. No acute fracture or destructive lesions. Multilevel thoracic osteophytosis. Changes suggestive of chronic rotator cuff tears bilaterally. IMPRESSION: No acute cardiopulmonary abnormality. Electronically Signed   By: Rogelia Myers M.D.   On: 09/06/2024 18:55     Assessment and Plan:   [name] is Mccormick 83 y.o. male with Mccormick hx of Wenckebach, PAF (not on OAC due to epistaxis), HLD, DM2, prior DVT, and dementia who is being seen 09/07/2024 for the evaluation of bradycardia at the request of Dr. Mario Mccormick.  #Sinus Bradycardia #Intermittent Wenckebach :: Patient admitted with Mccormick UTI and noted to be bradycardic on telemetry.  On reviewing his telemetry he has  predominantly sinus bradycardia and NSR with periodic dropped beats.  It appears as though the PR interval prolongs before he drops these beats which is suggestive of Wenckebach physiology.  The patient is completely asymptomatic which is also reassuring.  Wenckebach is oftentimes Mccormick normal physiologic entity and in the absence of symptoms, it is not problematic.  I do not think Mccormick PPM is indicated particularly in someone with an active infection.  I recommend ongoing close monitoring with telemetry, but no additional cardiac workup is required at this time.  #PAF :: Not Mccormick candidate for OAC given his recurrent epistaxis and patient choice.  Not on any AV nodal blocking agents given his bradycardia.  No additional changes at this time.   Risk Assessment/Risk Scores:         CHA2DS2-VASc Score = 4   This indicates Mccormick 4.8% annual risk of stroke. The patient's score is based upon: CHF History: 0 HTN History: 1 Diabetes History: 1 Stroke History: 0 Vascular Disease History: 0 Age Score: 2 Gender Score: 0     [facility] will sign off.   Medication Recommendations: Avoid AV nodal blocking agents Other recommendations (labs, testing, etc):  N/Mccormick Follow up as an outpatient: Follow-up with EP prn  For questions or updates, please contact [facility] Please consult [email or web address] for contact info under      Signed, Georganna Archer, MD  09/07/2024 6:34 PM

## 2024-09-08 DIAGNOSIS — R001 Bradycardia, unspecified: Secondary | ICD-10-CM | POA: Diagnosis not present

## 2024-09-08 DIAGNOSIS — E119 Type 2 diabetes mellitus without complications: Secondary | ICD-10-CM | POA: Diagnosis not present

## 2024-09-08 DIAGNOSIS — I442 Atrioventricular block, complete: Secondary | ICD-10-CM | POA: Diagnosis not present

## 2024-09-08 DIAGNOSIS — F0390 Unspecified dementia without behavioral disturbance: Secondary | ICD-10-CM | POA: Diagnosis not present

## 2024-09-08 DIAGNOSIS — R4182 Altered mental status, unspecified: Secondary | ICD-10-CM | POA: Diagnosis not present

## 2024-09-08 DIAGNOSIS — F03918 Unspecified dementia, unspecified severity, with other behavioral disturbance: Secondary | ICD-10-CM | POA: Diagnosis not present

## 2024-09-08 DIAGNOSIS — N3000 Acute cystitis without hematuria: Secondary | ICD-10-CM

## 2024-09-08 LAB — CBC
HCT: 40.5 % (ref 39.0–52.0)
Hemoglobin: 13 g/dL (ref 13.0–17.0)
MCH: 28.8 pg (ref 26.0–34.0)
MCHC: 32.1 g/dL (ref 30.0–36.0)
MCV: 89.8 fL (ref 80.0–100.0)
Platelets: 220 K/uL (ref 150–400)
RBC: 4.51 MIL/uL (ref 4.22–5.81)
RDW: 12.7 % (ref 11.5–15.5)
WBC: 5.5 K/uL (ref 4.0–10.5)
nRBC: 0 % (ref 0.0–0.2)

## 2024-09-08 LAB — COMPREHENSIVE METABOLIC PANEL WITH GFR
ALT: 8 U/L (ref 0–44)
AST: 17 U/L (ref 15–41)
Albumin: 2.8 g/dL — ABNORMAL LOW (ref 3.5–5.0)
Alkaline Phosphatase: 44 U/L (ref 38–126)
Anion gap: 8 (ref 5–15)
BUN: 21 mg/dL (ref 8–23)
CO2: 21 mmol/L — ABNORMAL LOW (ref 22–32)
Calcium: 8.5 mg/dL — ABNORMAL LOW (ref 8.9–10.3)
Chloride: 109 mmol/L (ref 98–111)
Creatinine, Ser: 1.25 mg/dL — ABNORMAL HIGH (ref 0.61–1.24)
GFR, Estimated: 57 mL/min — ABNORMAL LOW (ref >60)
Glucose, Bld: 108 mg/dL — ABNORMAL HIGH (ref 70–99)
Potassium: 4.2 mmol/L (ref 3.5–5.1)
Sodium: 138 mmol/L (ref 135–145)
Total Bilirubin: 0.4 mg/dL (ref 0.0–1.2)
Total Protein: 6.3 g/dL — ABNORMAL LOW (ref 6.5–8.1)

## 2024-09-08 LAB — GLUCOSE, CAPILLARY
Glucose-Capillary: 113 mg/dL — ABNORMAL HIGH (ref 70–99)
Glucose-Capillary: 123 mg/dL — ABNORMAL HIGH (ref 70–99)

## 2024-09-08 LAB — HEMOGLOBIN A1C
Hgb A1c MFr Bld: 5.7 % — ABNORMAL HIGH (ref 4.8–5.6)
Mean Plasma Glucose: 116.89 mg/dL

## 2024-09-08 LAB — MAGNESIUM: Magnesium: 2.1 mg/dL (ref 1.7–2.4)

## 2024-09-08 LAB — TSH: TSH: 0.626 u[IU]/mL (ref 0.350–4.500)

## 2024-09-08 MED ORDER — INSULIN ASPART 100 UNIT/ML IJ SOLN: 0.0000 [IU] | Freq: Three times a day (TID) | INTRAMUSCULAR | Status: DC

## 2024-09-08 MED ORDER — ACETAMINOPHEN 325 MG PO TABS: 650.0000 mg | ORAL_TABLET | Freq: Four times a day (QID) | ORAL | Status: DC | PRN

## 2024-09-08 MED ORDER — ACETAMINOPHEN 650 MG RE SUPP: 650.0000 mg | Freq: Four times a day (QID) | RECTAL | Status: DC | PRN

## 2024-09-08 MED ORDER — HEPARIN SODIUM (PORCINE) 5000 UNIT/ML IJ SOLN
5000.0000 [IU] | Freq: Two times a day (BID) | INTRAMUSCULAR | Status: DC
  Administered 2024-09-08 – 2024-09-09 (×3): 5000 [IU] via SUBCUTANEOUS
  Filled 2024-09-08 (×3): qty 1

## 2024-09-08 MED ORDER — HYDRALAZINE HCL 20 MG/ML IJ SOLN: 10.0000 mg | INTRAMUSCULAR | Status: DC | PRN

## 2024-09-08 NOTE — Plan of Care (Signed)

## 2024-09-08 NOTE — Progress Notes (Signed)
 This RN went inside patient's room in response to a call from central telemetry, with concerns of patient having a complete heart block. vital signs taken. Notified Provider.

## 2024-09-08 NOTE — Progress Notes (Addendum)
 Progress Note  Patient Name: [name] Date of Encounter: 09/08/2024  Primary Cardiologist: Victory LELON Claudene Eamon, MD (Inactive)  Subjective   Cardiology asked to reconsult due to concern for complete heart block earlier today. On telemetry, appears to be having sinus bradycardia, Mobitz 1 AVB, brief CHB, episodic junctional rhythm, episodic axis shift, and episodic 2:1 AVB with HR upper 30s and above.   He remains confused, knows he is at [facility] but does not recall his name or date. Denies any CP, SOB, dizziness, syncope, or any acute complaints. When asked about family coming to the hospital he states his father in law will be coming.   Inpatient Medications    Scheduled Meds:  aspirin  EC  81 mg Oral Daily   donepezil  5 mg Oral QHS   feeding supplement  237 mL Oral BID BM   heparin   5,000 Units Subcutaneous Q12H   [START ON 09/09/2024] insulin  aspart  0-9 Units Subcutaneous TID WC   magnesium  oxide  200 mg Oral BID   mirtazapine   15 mg Oral QHS   pantoprazole  (PROTONIX ) IV  40 mg Intravenous Q12H   QUEtiapine   25 mg Oral QHS   Continuous Infusions:  cefTRIAXone  (ROCEPHIN )  IV 2 g (09/08/24 1521)   PRN Meds: acetaminophen  **OR** acetaminophen , hydrALAZINE   Vital Signs    Vitals:   09/08/24 0700 09/08/24 0748 09/08/24 1131 09/08/24 1625  BP: (!) 108/52  (!) 142/75 (!) 122/46  Pulse: (!) 37 86 (!) 53 (!) 38  Resp:  16    Temp:  97.8 F (36.6 C) 98.5 F (36.9 C) 98 F (36.7 C)  TempSrc:  Axillary Oral Oral  SpO2: 100% 100% 100% 100%  Weight:      Height:        Intake/Output Summary (Last 24 hours) at 09/08/2024 1626 Last data filed at 09/07/2024 2358 Gross per 24 hour  Intake 100.17 ml  Output 125 ml  Net -24.83 ml      09/08/2024    5:00 AM 09/07/2024    2:00 PM 12/02/2022    8:43 AM  Last 3 Weights  Weight (lbs) 156 lb 8.4 oz 148 lb 5.9 oz 158 lb  Weight (kg) 71 kg 67.3 kg 71.668 kg     Telemetry    As above - Personally Reviewed  ECG     Marked bradycardia with possible CHB 36bpm  - Personally Reviewed  Physical Exam   GEN: No acute distress.  HEENT: Normocephalic, atraumatic, sclera non-icteric. Neck: No JVD or bruits. Cardiac: slow, irregular, no murmurs, rubs, or gallops.  Respiratory: Clear to auscultation bilaterally. Breathing is unlabored. GI: Soft, nontender, non-distended, BS +x 4. MS: no deformity. Extremities: No clubbing or cyanosis. No edema. Distal pedal pulses are 2+ and equal bilaterally. Neuro:  A+O to place only. Follows commands. Tangentially mumbling at times, can re-orient with another question. Psych: Calm affect.  Labs    High Sensitivity Troponin:  No results for input(s): TROPONINIHS in the last 720 hours.    Cardiac EnzymesNo results for input(s): TROPONINI in the last 168 hours. No results for input(s): TROPIPOC in the last 168 hours.   Chemistry Recent Labs  Lab 09/06/24 1702 09/08/24 0512  NA 140 138  K 4.4 4.2  CL 103 109  CO2 25 21*  GLUCOSE 109* 108*  BUN 24* 21  CREATININE 1.27* 1.25*  CALCIUM 9.4 8.5*  PROT 8.2* 6.3*  ALBUMIN 4.3 2.8*  AST 23 17  ALT 7 8  ALKPHOS 65 44  BILITOT 0.4 0.4  GFRNONAA 56* 57*  ANIONGAP 12 8     Hematology Recent Labs  Lab 09/06/24 1702 09/08/24 0512  WBC 6.9 5.5  RBC 5.07 4.51  HGB 14.7 13.0  HCT 45.5 40.5  MCV 89.7 89.8  MCH 29.0 28.8  MCHC 32.3 32.1  RDW 13.0 12.7  PLT 292 220    BNPNo results for input(s): BNP, PROBNP in the last 168 hours.   DDimer No results for input(s): DDIMER in the last 168 hours.   Radiology    CT Head Wo Contrast Result Date: 09/06/2024 CLINICAL DATA:  Altered mental status EXAM: CT HEAD WITHOUT CONTRAST TECHNIQUE: Contiguous axial images were obtained from the base of the skull through the vertex without intravenous contrast. RADIATION DOSE REDUCTION: This exam was performed according to the departmental dose-optimization program which includes automated exposure control,  adjustment of the mA and/or kV according to patient size and/or use of iterative reconstruction technique. COMPARISON:  10/12/2019 FINDINGS: Brain: No evidence of acute infarction, hemorrhage, hydrocephalus, extra-axial collection or mass lesion/mass effect. Chronic atrophic and ischemic changes are noted. Vascular: No hyperdense vessel or unexpected calcification. Skull: Normal. Negative for fracture or focal lesion. Sinuses/Orbits: Paranasal sinuses are within normal limits. 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 Other: None IMPRESSION: Chronic atrophic and ischemic changes similar to that seen on the prior exam. No acute infarct is noted. Electronically Signed   By: Oneil Devonshire M.D.   On: 09/06/2024 20:25   DG Chest Port 1 View Result Date: 09/06/2024 CLINICAL DATA:  Questionable sepsis - evaluate for abnormality EXAM: PORTABLE CHEST - 1 VIEW COMPARISON:  07/16/2022 FINDINGS: No focal airspace consolidation, pleural effusion, or pneumothorax. No cardiomegaly. Tortuous aorta with aortic atherosclerosis. No acute fracture or destructive lesions. Multilevel thoracic osteophytosis. Changes suggestive of chronic rotator cuff tears bilaterally. IMPRESSION: No acute cardiopulmonary abnormality. Electronically Signed   By: Rogelia Myers M.D.   On: 09/06/2024 18:55    Cardiac Studies   2d echo 2023   1. Left ventricular ejection fraction, by estimation, is 60 to 65%. The  left ventricle has normal function. The left ventricle has no regional  wall motion abnormalities. Left ventricular diastolic parameters were  normal.   2. Right ventricular systolic function is normal. The right ventricular  size is normal.   3. Left atrial size was moderately dilated.   4. The mitral valve is abnormal. Trivial mitral valve regurgitation. No  evidence of mitral stenosis.   5. The aortic valve is tricuspid. There is mild calcification of the  aortic valve. There is mild thickening of the aortic valve. Aortic valve   regurgitation is not visualized. Aortic valve sclerosis is present, with  no evidence of aortic valve stenosis.   6. The inferior vena cava is normal in size with greater than 50%  respiratory variability, suggesting right atrial pressure of 3 mmHg.   Patient Profile     83 y.o. 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 Transferred from [facility] for disorientation/AMS attributed to UTI. Cardiology was consulted due to bradycardia on telemetry, initially felt to be sinus bradycardia with Wenckebach on telemetry.   Assessment & Plan    1. Bradycardia with underlying conduction disease - interestingly patient had similar admission in 2023 for UTI with bradycardia with 2nd degree type 1 AVB as well as  episodic 2:1 AVB, recommended for supportive care at that time - f/u monitor 2023 showed NSR, intermittent 2nd degree AVB primarily during nocturnal hours with rare daytime episode - admitted here with AMS felt exacerbated by UTI with underlying dementia, found to have worsening bradycardia on telemetry - On telemetry, appears to be having sinus bradycardia, Mobitz 1 AVB, brief CHB, episodic junctional rhythm, periodic axis shift, and episodic 2:1 AVB with HR upper 30s and above, will review with MD - does not appear to be acutely symptomatic with this, as he has had some confusion even when HR improved  - BP holding steady - check TSH - D/C Aricept - will discuss further with Dr. Okey  2. H/o PAF - none seen this admission - not on OAC due to patient preference, epistaxis; also previously declined Watchman  3. UTI/AMS - per primary team  For questions or updates, please contact [facility] Please consult [email or web address] for contact info under Cardiology/STEMI.  Signed, Raphael LOISE Bring, PA-C 09/08/2024, 4:26 PM    Pt seen and examined   I  agree with findings as noted above    Called to see patient with bradycardia    Review of monitor shows SR, PAT, Type 1 2nd degree AV block, 2:1 block and CHB with junctional escape   No prolonged pauses   BP has been maintained   Ptsays he feels great    On exam Lungs are CTA Cardiac   RRR   No S3 Abd supple  Ext are without edema    Warm  Would slop Aricept as noted above   Check TSH    Keep on telemetry With other medical  issues (UTI, dementia) would follow for now    No interventions planned   Note Hx PAF in past   Pt not on anticoagulation (per his choice)   Continue tele  Check TSH  Vina Okey MD

## 2024-09-08 NOTE — TOC Initial Note (Signed)
 Transition of Care [facility]) - Initial/Assessment Note    Patient Details  Name: [name] MRN: [id number] Date of Birth: 04-May-1941  Transition of [facility]) CM/SW Contact:    Sudie Erminio Deems, RN Phone Number: 09/08/2024, 3:44 PM  Clinical Narrative:  Patient presented for altered mental status. ICM spoke with spouse and she states patient has a hx of dementia. PTA patient was from home with spouse and granddaughter.  Patient has DME cane in the home. Spouse asked for additional assistance in the home for a bath. ICM did discuss HH services and time frame within the home for the program. Spouse is aware that in order to have the aide in the home; another discipline will have to be ordered such as PT or RN. Patient will benefit from PT/OT consult for recommendations. ICM will continue to follow for additional needs as the patient progresses.               Expected Discharge Plan: Home w Home Health Services Barriers to Discharge: Continued Medical Work up   Patient Goals and CMS Choice Patient states their goals for this hospitalization and ongoing recovery are:: Spouse would like a HH Aide for bath assistance if the plan is for home.   Expected Discharge Plan and Services In-house Referral: NA Discharge Planning Services: CM Consult Post Acute Care Choice: Home Health Living arrangements for the past 2 months: Single Family Home                   DME Agency: NA  Prior Living Arrangements/Services Living arrangements for the past 2 months: Single Family Home Lives with:: Spouse, Relatives Patient language and need for interpreter reviewed:: Yes Do you feel safe going back to the place where you live?: Yes      Need for Family Participation in Patient Care: Yes (Comment) Care giver support system in place?: Yes (comment) Current home services: DME (cane) Criminal Activity/Legal Involvement Pertinent to Current Situation/Hospitalization: No - Comment as  needed  Activities of Daily Living   ADL Screening (condition at time of admission) Independently performs ADLs?: Yes (appropriate for developmental age) Is the patient deaf or have difficulty hearing?: No Does the patient have difficulty seeing, even when wearing glasses/contacts?: No Does the patient have difficulty concentrating, remembering, or making decisions?: Yes  Permission Sought/Granted Permission sought to share information with : Family Supports     Emotional Assessment Appearance:: Appears stated age Attitude/Demeanor/Rapport: Engaged Affect (typically observed): Appropriate Orientation: : Oriented to Self Alcohol / Substance Use: Not Applicable Psych Involvement: No (comment)  Admission diagnosis:  Acute cystitis without hematuria [N30.00] Altered mental status, unspecified altered mental status type [R41.82] AMS (altered mental status) [R41.82] Dementia, unspecified dementia severity, unspecified dementia type, unspecified whether behavioral, psychotic, or mood disturbance or anxiety (HCC) [F03.90] Patient Active Problem List   Diagnosis Date Noted   AMS (altered mental status) 09/07/2024   Counseling regarding advance care planning and goals of care    Acute cystitis without hematuria    Heart block 07/04/2022   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   Mixed diabetic hyperlipidemia associated with type 2 diabetes mellitus (HCC) 07/03/2022   GERD without esophagitis 07/03/2022   Sepsis secondary to UTI (HCC) 10/12/2019   AKI (acute kidney injury) 10/12/2019   Paroxysmal atrial fibrillation (HCC) 10/12/2019   Acute metabolic encephalopathy 10/12/2019   PAC (premature atrial contraction)    Blocked premature atrial contraction    Acute urinary retention 09/14/2019   Protein-calorie malnutrition,  severe 04/28/2014   Acute blood loss anemia 04/28/2014   Septic joint (HCC) 04/25/2014   DVT of lower extremity, bilateral (HCC) 04/01/2014   Bacterial infection of  knee joint (HCC) 03/29/2014   Essential hypertension 03/01/2014   Chest pain 03/01/2014   Type 2 diabetes mellitus without complication, without long-term current use of insulin  (HCC) 03/01/2014   Hyperlipidemia 03/01/2014   PCP:  Cleotilde Planas, MD Pharmacy:   [facility] [id number] - [address] [place] - [address] [place] [address] Phone: [phone] Fax: [phone]  [facility] - [place], [place] - [facility] AT Portal to Registered [facility] [address] [place] [address] Phone: [phone] Fax: [phone]     Social Drivers of Health (SDOH) Social History: SDOH Screenings   Food Insecurity: No Food Insecurity (09/07/2024)  Housing: Low Risk  (09/07/2024)  Transportation Needs: No Transportation Needs (09/07/2024)  Utilities: Not At Risk (09/07/2024)  Social Connections: Moderately Integrated (09/07/2024)  Tobacco Use: Medium Risk (09/06/2024)   SDOH Interventions:     Readmission Risk Interventions    07/07/2022    1:58 PM  Readmission Risk Prevention Plan  Post Dischage Appt Complete  Medication Screening Complete  Transportation Screening Complete

## 2024-09-08 NOTE — Progress Notes (Addendum)
 [facility]  PROGRESS NOTE  [name] FMW:[id number] DOB: 02-01-1941 DOA: 09/06/2024 PCP: Cleotilde Planas, MD   Brief HPI:    83 y.o. male with past medical history  of complete heart block, PAF, diabetes mellitus type 2, GERD, septic joint history, DVT history, history of ABLA, history of AKI, mixed type,Anemia, mixed hyperlipidemia brought from [facility] for disorientation attributed to urinary tract infection, head CT negative for any acute findings, abnormal urinalysis as shown below.    Assessment/Plan:    Altered mental status/UTI -Patient found to have abnormal UA -Started on IV Rocephin  -Urine culture not obtained on admission -Will obtain urine culture and follow urine culture results.   Diabetes mellitus type 2 - Metformin  on hold - Start sliding scale insulin  with NovoLog   Paroxysmal atrial fibrillation/bradycardia/first-degree AV block -Heart rate controlled, not on anticoagulation due to history of recurrent epistaxis and patient choice as per cardiology -Cardiology was consulted, had signed off -Today Central telemetry called that patient was having complete heart block -Will reconsult cardiology  Hypertension - Blood pressure is stable - Continue to hold lisinopril   Dementia with behavioral disturbance - Continue Seroquel , Aricept, Remeron     DVT prophylaxis: Heparin   Medications     aspirin  EC  81 mg Oral Daily   donepezil  5 mg Oral QHS   feeding supplement  237 mL Oral BID BM   heparin   5,000 Units Subcutaneous Q12H   magnesium  oxide  200 mg Oral BID   mirtazapine   15 mg Oral QHS   pantoprazole  (PROTONIX ) IV  40 mg Intravenous Q12H   QUEtiapine   25 mg Oral QHS     Data Reviewed:   CBG:  Recent Labs  Lab 09/06/24 1712  GLUCAP 143*    SpO2: 100 %    Vitals:   09/08/24 0346 09/08/24 0500 09/08/24 0700 09/08/24 0748  BP: 130/79  (!) 108/52   Pulse: 60  (!) 37 86  Resp: 16   16  Temp: (!) 97.4 F (36.3 C)   97.8  F (36.6 C)  TempSrc: Axillary   Axillary  SpO2: 100%  100% 100%  Weight:  71 kg    Height:          Data Reviewed:  Basic Metabolic Panel: Recent Labs  Lab 09/06/24 1702 09/08/24 0512  NA 140 138  K 4.4 4.2  CL 103 109  CO2 25 21*  GLUCOSE 109* 108*  BUN 24* 21  CREATININE 1.27* 1.25*  CALCIUM 9.4 8.5*    CBC: Recent Labs  Lab 09/06/24 1702 09/08/24 0512  WBC 6.9 5.5  HGB 14.7 13.0  HCT 45.5 40.5  MCV 89.7 89.8  PLT 292 220    LFT Recent Labs  Lab 09/06/24 1702 09/08/24 0512  AST 23 17  ALT 7 8  ALKPHOS 65 44  BILITOT 0.4 0.4  PROT 8.2* 6.3*  ALBUMIN 4.3 2.8*     Antibiotics: Anti-infectives (From admission, onward)    Start     Dose/Rate Route Frequency Ordered Stop   09/07/24 1515  cefTRIAXone  (ROCEPHIN ) 2 g in sodium chloride  0.9 % 100 mL IVPB        2 g 200 mL/hr over 30 Minutes Intravenous Every 24 hours 09/07/24 1428     09/06/24 2345  cefTRIAXone  (ROCEPHIN ) 2 g in sodium chloride  0.9 % 100 mL IVPB        2 g 200 mL/hr over 30 Minutes Intravenous  Once 09/06/24 2335 09/07/24 0027  CONSULTS cardiology  Code Status: Full code  Family Communication: No family at bedside     Subjective   Continues to be confused.  Central telemetry called that patient was having complete heart block.  He is asymptomatic.  Heart rate is controlled   Objective    Physical Examination:  General-appears in no acute distress Heart-S1-S2, regular, no murmur auscultated Lungs-clear to auscultation bilaterally, no wheezing or crackles auscultated Abdomen-soft, nontender, no organomegaly Extremities-no edema in the lower extremities Neuro-alert, oriented x3, no focal deficit noted   Status is: Inpatient:             [name]   [facility] If 7PM-7AM, please contact night-coverage at [email or web address], Office  [phone]   09/08/2024, 8:53 AM  LOS: 1 day

## 2024-09-08 NOTE — Plan of Care (Signed)
   Problem: Elimination: Goal: Will not experience complications related to bowel motility Outcome: Progressing

## 2024-09-09 DIAGNOSIS — R001 Bradycardia, unspecified: Secondary | ICD-10-CM | POA: Diagnosis not present

## 2024-09-09 DIAGNOSIS — I480 Paroxysmal atrial fibrillation: Secondary | ICD-10-CM | POA: Diagnosis not present

## 2024-09-09 DIAGNOSIS — N3000 Acute cystitis without hematuria: Secondary | ICD-10-CM | POA: Diagnosis not present

## 2024-09-09 DIAGNOSIS — I442 Atrioventricular block, complete: Secondary | ICD-10-CM | POA: Diagnosis not present

## 2024-09-09 DIAGNOSIS — R4182 Altered mental status, unspecified: Secondary | ICD-10-CM | POA: Diagnosis not present

## 2024-09-09 LAB — URINE CULTURE: Culture: NO GROWTH

## 2024-09-09 LAB — GLUCOSE, CAPILLARY
Glucose-Capillary: 75 mg/dL (ref 70–99)
Glucose-Capillary: 79 mg/dL (ref 70–99)
Glucose-Capillary: 86 mg/dL (ref 70–99)
Glucose-Capillary: 95 mg/dL (ref 70–99)
Glucose-Capillary: 96 mg/dL (ref 70–99)

## 2024-09-09 MED ORDER — PANTOPRAZOLE SODIUM 40 MG PO TBEC
40.0000 mg | DELAYED_RELEASE_TABLET | Freq: Every day | ORAL | Status: DC
  Administered 2024-09-10 – 2024-09-11 (×2): 40 mg via ORAL
  Filled 2024-09-09 (×2): qty 1

## 2024-09-09 NOTE — Progress Notes (Signed)
 [facility]  PROGRESS NOTE  [name] FMW:[id number] DOB: [birth year] DOA: 09/06/2024 PCP: Cleotilde Planas, MD   Brief HPI:    83 y.o. male with past medical history  of complete heart block, PAF, diabetes mellitus type 2, GERD, septic joint history, DVT history, history of ABLA, history of AKI, mixed type,Anemia, mixed hyperlipidemia brought from [facility] for disorientation attributed to urinary tract infection, head CT negative for any acute findings, abnormal urinalysis as shown below.    Assessment/Plan:    Altered mental status/UTI -Patient found to have abnormal UA -Started on IV Rocephin  -Urine culture not obtained on admission - Urine culture obtained on 10/22, follow urine culture results   Diabetes mellitus type 2 - Metformin  on hold - Continue sliding scale insulin  with NovoLog   Paroxysmal atrial fibrillation/bradycardia/first-degree AV block -Heart rate controlled, not on anticoagulation due to history of recurrent epistaxis and patient choice as per cardiology -Cardiology was consulted, had signed off - Central telemetry called that patient was having intermittent episodes of complete heart block - Cardiology was reconsulted, at this time patient family has refused to get pacemaker placement - Cardiology has again signed off  Hypertension - Blood pressure is stable - Continue to hold lisinopril   Dementia with behavioral disturbance - Continue Seroquel ,  Remeron  - Aricept discontinued as this can cause bradycardia    DVT prophylaxis: Heparin   Medications     aspirin  EC  81 mg Oral Daily   feeding supplement  237 mL Oral BID BM   heparin   5,000 Units Subcutaneous Q12H   insulin  aspart  0-9 Units Subcutaneous TID WC   magnesium  oxide  200 mg Oral BID   mirtazapine   15 mg Oral QHS   pantoprazole  (PROTONIX ) IV  40 mg Intravenous Q12H   QUEtiapine   25 mg Oral QHS     Data Reviewed:   CBG:  Recent Labs  Lab 09/06/24 1712  09/08/24 1819 09/08/24 2059 09/09/24 0820  GLUCAP 143* 113* 123* 75    SpO2: 100 %    Vitals:   09/08/24 1959 09/08/24 2300 09/09/24 0312 09/09/24 0821  BP: (!) 113/95 (!) 118/55 (!) 118/52 (!) 146/71  Pulse: 69 62 (!) 26 (!) 34  Resp: 18  18 20   Temp: 98.9 F (37.2 C) 98 F (36.7 C) 98.3 F (36.8 C) 97.7 F (36.5 C)  TempSrc: Axillary Axillary Axillary Axillary  SpO2: 97% 100% 100% 100%  Weight:   71.5 kg   Height:          Data Reviewed:  Basic Metabolic Panel: Recent Labs  Lab 09/06/24 1702 09/08/24 0512 09/08/24 1738  NA 140 138  --   K 4.4 4.2  --   CL 103 109  --   CO2 25 21*  --   GLUCOSE 109* 108*  --   BUN 24* 21  --   CREATININE 1.27* 1.25*  --   CALCIUM 9.4 8.5*  --   MG  --   --  2.1    CBC: Recent Labs  Lab 09/06/24 1702 09/08/24 0512  WBC 6.9 5.5  HGB 14.7 13.0  HCT 45.5 40.5  MCV 89.7 89.8  PLT 292 220    LFT Recent Labs  Lab 09/06/24 1702 09/08/24 0512  AST 23 17  ALT 7 8  ALKPHOS 65 44  BILITOT 0.4 0.4  PROT 8.2* 6.3*  ALBUMIN 4.3 2.8*     Antibiotics: Anti-infectives (From admission, onward)    Start     Dose/Rate  Route Frequency Ordered Stop   09/07/24 1515  cefTRIAXone  (ROCEPHIN ) 2 g in sodium chloride  0.9 % 100 mL IVPB        2 g 200 mL/hr over 30 Minutes Intravenous Every 24 hours 09/07/24 1428     09/06/24 2345  cefTRIAXone  (ROCEPHIN ) 2 g in sodium chloride  0.9 % 100 mL IVPB        2 g 200 mL/hr over 30 Minutes Intravenous  Once 09/06/24 2335 09/07/24 0027        CONSULTS cardiology  Code Status: Full code  Family Communication: Granddaughter at bedside     Subjective   Patient seen and examined.  Denies any complaints.  Granddaughter at bedside   Objective    Physical Examination:  Appears in no acute distress S1-S2, regular, no murmur auscultated Abdomen is soft, nontender, no organomegaly Extremities no edema   Status is: Inpatient:             [name]   [facility] If 7PM-7AM, please contact night-coverage at [email or web address], Office  [phone]   09/09/2024, 8:41 AM  LOS: 2 days

## 2024-09-09 NOTE — Progress Notes (Signed)
  Progress Note  Patient Name: [name] Date of Encounter: 09/09/2024 [facility] Cardiologist: Victory LELON Claudene Makhari, MD (Inactive)   Interval Summary   Telemetry reviewed, appears to be primarily 2:1 AV block, with episodic junctional rhythm Patient asymptomatic, however fairly confused/disoriented, pleasant Reports he is very comfortable, resting in bed Discussed with MD, will reach out to our EP team for further evaluation  Vital Signs Vitals:   09/08/24 1959 09/08/24 2300 09/09/24 0312 09/09/24 0821  BP: (!) 113/95 (!) 118/55 (!) 118/52 (!) 146/71  Pulse: 69 62 (!) 26 (!) 34  Resp: 18  18 20   Temp: 98.9 F (37.2 C) 98 F (36.7 C) 98.3 F (36.8 C) 97.7 F (36.5 C)  TempSrc: Axillary Axillary Axillary Axillary  SpO2: 97% 100% 100% 100%  Weight:   71.5 kg   Height:        Intake/Output Summary (Last 24 hours) at 09/09/2024 0836 Last data filed at 09/09/2024 0556 Gross per 24 hour  Intake --  Output 500 ml  Net -500 ml      09/09/2024    3:12 AM 09/08/2024    5:00 AM 09/07/2024    2:00 PM  Last 3 Weights  Weight (lbs) 157 lb 10.1 oz 156 lb 8.4 oz 148 lb 5.9 oz  Weight (kg) 71.5 kg 71 kg 67.3 kg     Telemetry/ECG  Sinus bradycardia, brief CHB, 2:1 AV block, episodic junctional rhythm, Mobitz 1 AV block- Personally Reviewed  Physical Exam  GEN: No acute distress.   Neck: No JVD Cardiac: Bradycardic, no murmurs, rubs, or gallops.  Respiratory: Clear to auscultation bilaterally. GI: Soft, nontender, non-distended  MS: No edema Neuro: Can tell me his name, birth year this morning.  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 block, Mobitz 1 AV block Brief complete heart block Episodic junctional rhythm Patient has experienced this prior during an admission in 2023 for UTI Underwent  supportive care at that time Cardiac monitor in 2023 showed: NSR, intermittent second-degree AVB primarily at night Currently admitted with altered mental status, suspected to be secondary to UTI with underlying dementia Does not appear to be acutely symptomatic, however is fairly confused BP remains stable Mag 2.1, K4.2, TSH normal Continue to avoid any AV nodal blockers  Will discuss with rounding MD, as well as our EP team to see if he would be a candidate for pacemaker  History of paroxysmal A-fib No A-fib noted this admission Not on anticoagulation, patient preference due to epistaxis Previously declined watchman TSH, electrolytes normal Continue to monitor on telemetry  Per primary Diabetes Altered mental status UTI Dementia   For questions or updates, please contact [facility] Please consult [email or web address] for contact info under         Signed, Waddell DELENA Donath, PA-C

## 2024-09-09 NOTE — Progress Notes (Signed)
 Rounding Note   Patient Name: [name] Date of Encounter: 09/09/2024  [facility] Cardiologist: Victory LELON Brandon Abdulmalik, MD (Inactive)   Subjective - Patient developed more high degree AV block yesterday prompting reengagement with cardiology - Patient remains asymptomatic and denies dizziness, lightheadedness, syncope, chest pain or SOB  Scheduled Meds:  aspirin  EC  81 mg Oral Daily   feeding supplement  237 mL Oral BID BM   heparin   5,000 Units Subcutaneous Q12H   insulin  aspart  0-9 Units Subcutaneous TID WC   magnesium  oxide  200 mg Oral BID   mirtazapine   15 mg Oral QHS   pantoprazole   40 mg Oral Daily   QUEtiapine   25 mg Oral QHS   Continuous Infusions:  cefTRIAXone  (ROCEPHIN )  IV 2 g (09/08/24 1521)   PRN Meds: acetaminophen  **OR** acetaminophen , hydrALAZINE   Vital Signs  Vitals:   09/08/24 2300 09/09/24 0312 09/09/24 0821 09/09/24 1120  BP: (!) 118/55 (!) 118/52 (!) 146/71 (!) 146/52  Pulse: 62 (!) 26 (!) 34 (!) 30  Resp:  18 20 19   Temp: 98 F (36.7 C) 98.3 F (36.8 C) 97.7 F (36.5 C) 98.1 F (36.7 C)  TempSrc: Axillary Axillary Axillary Oral  SpO2: 100% 100% 100% 98%  Weight:  71.5 kg    Height:        Intake/Output Summary (Last 24 hours) at 09/09/2024 1238 Last data filed at 09/09/2024 0556 Gross per 24 hour  Intake --  Output 500 ml  Net -500 ml      09/09/2024    3:12 AM 09/08/2024    5:00 AM 09/07/2024    2:00 PM  Last 3 Weights  Weight (lbs) 157 lb 10.1 oz 156 lb 8.4 oz 148 lb 5.9 oz  Weight (kg) 71.5 kg 71 kg 67.3 kg      Telemetry Intermittent high degree AVB and CHB - Personally Reviewed  ECG  Sinus bradycardia with occasional PACs- Personally Reviewed  Physical Exam  GEN: No acute distress.   Neck: No JVD Cardiac: Bradycardic with regular rhythm, no murmurs, rubs, or gallops.  Respiratory: Clear to auscultation bilaterally. GI: Soft, nontender, non-distended  MS: No edema; No deformity. Neuro:  Nonfocal   Psych: Normal affect   Labs High Sensitivity Troponin:  No results for input(s): TROPONINIHS in the last 720 hours.   Chemistry Recent Labs  Lab 09/06/24 1702 09/08/24 0512 09/08/24 1738  NA 140 138  --   K 4.4 4.2  --   CL 103 109  --   CO2 25 21*  --   GLUCOSE 109* 108*  --   BUN 24* 21  --   CREATININE 1.27* 1.25*  --   CALCIUM 9.4 8.5*  --   MG  --   --  2.1  PROT 8.2* 6.3*  --   ALBUMIN 4.3 2.8*  --   AST 23 17  --   ALT 7 8  --   ALKPHOS 65 44  --   BILITOT 0.4 0.4  --   GFRNONAA 56* 57*  --   ANIONGAP 12 8  --     Lipids No results for input(s): CHOL, TRIG, HDL, LABVLDL, LDLCALC, CHOLHDL in the last 168 hours.  Hematology Recent Labs  Lab 09/06/24 1702 09/08/24 0512  WBC 6.9 5.5  RBC 5.07 4.51  HGB 14.7 13.0  HCT 45.5 40.5  MCV 89.7 89.8  MCH 29.0 28.8  MCHC 32.3 32.1  RDW 13.0 12.7  PLT 292  220   Thyroid   Recent Labs  Lab 09/08/24 1738  TSH 0.626    BNPNo results for input(s): BNP, PROBNP in the last 168 hours.  DDimer No results for input(s): DDIMER in the last 168 hours.   Radiology  No results found.  Cardiac Studies  TTE 07/02/22:  IMPRESSIONS     1. Left ventricular ejection fraction, by estimation, is 60 to 65%. The  left ventricle has normal function. The left ventricle has no regional  wall motion abnormalities. Left ventricular diastolic parameters were  normal.   2. Right ventricular systolic function is normal. The right ventricular  size is normal.   3. Left atrial size was moderately dilated.   4. The mitral valve is abnormal. Trivial mitral valve regurgitation. No  evidence of mitral stenosis.   5. The aortic valve is tricuspid. There is mild calcification of the  aortic valve. There is mild thickening of the aortic valve. Aortic valve  regurgitation is not visualized. Aortic valve sclerosis is present, with  no evidence of aortic valve stenosis.   6. The inferior vena cava is normal in size with  greater than 50%  respiratory variability, suggesting right atrial pressure of 3 mmHg.   Patient Profile   [name] is a 83 y.o. male with a hx of Wenckebach, PAF (not on OAC due to epistaxis), HLD, DM2, prior DVT, and dementia who is being seen 09/07/2024 for the evaluation of bradycardia at the request of Dr. Mario Blanch.   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  Originally consulted on the patient on 10/21 and the patient was having second-degree type I AVB at that time.  Since then his telemetry has demonstrated intermittent episodes of high degree AV block and CHB.  EP evaluated the patient and they do not want to proceed with PPM given that the patient is asymptomatic in his current health condition.  No additional intervention is required at this time given the family's decision not to pursue PPM.  Recommend avoiding all AV nodal blocking agents. -consulted EP, I appreciate their recommendations -Avoid all AV nodal blocking agents -Recommend outpatient cardiology follow-up   #PAF :: Not a candidate for OAC given his recurrent epistaxis and patient choice.  Not on any AV nodal blocking agents given his bradycardia.  No additional changes at this time.  #UTI - Antibiotics per primary      For questions or updates, please contact [facility] Please consult [email or web address] for contact info under       Signed, Georganna Archer, MD  09/09/2024, 12:38 PM

## 2024-09-09 NOTE — Plan of Care (Signed)
   Problem: Coping: Goal: Level of anxiety will decrease Outcome: Progressing

## 2024-09-09 NOTE — Progress Notes (Signed)
   09/09/24 0821  Vitals  Temp 97.7 F (36.5 C)  Temp Source Axillary  BP (!) 146/71  MAP (mmHg) 92  BP Location Left Arm  BP Method Automatic  Patient Position (if appropriate) Lying  Pulse Rate (!) 34  Pulse Rate Source Monitor  ECG Heart Rate (!) 31  Resp 20  MEWS COLOR  MEWS Score Color Yellow  Oxygen Therapy  SpO2 100 %  O2 Device Room Air  MEWS Score  MEWS Temp 0  MEWS Systolic 0  MEWS Pulse 2  MEWS RR 0  MEWS LOC 0  MEWS Score 2   Patient scoring Yellow MEWS this AM for HR.  Noted complete AV block on telemetry with rate in 30s.  No change in mental status noted from baseline (chronic confusion).  Per cardiology, patient would be poor candidate for invasive management with permanent pacemaker; medical management is in process.  Dr. Drusilla and Waddell Donath, PA for cardiology made aware.  Patient remains full code status at this time.  Continuing to monitor.

## 2024-09-09 NOTE — Consult Note (Addendum)
 ELECTROPHYSIOLOGY CONSULT NOTE    Patient ID: ANTOIN DARGIS MRN: [id number], DOB/AGE: 1941-07-09 83 y.o.  Admit date: 09/06/2024 Date of Consult: 09/09/2024  Primary Physician: Cleotilde Planas, MD Primary Cardiologist: Victory LELON Claudene Lymon, MD (Inactive)  Electrophysiologist: Dr. Cindie   Referring Provider: Dr. Floretta  Patient Profile: [name] is a 83 y.o. male with a history of paroxysmal atrial fibrillation (not on OAC 2/2 epistaxis), Wenckebach, hyperlipidemia, DM type II, prior DVT, dementia who is being seen today for the evaluation of heart block at the request of Dr. Floretta.  HPI:  [name] is a 83 y.o. male with above noted medical history who presented to the ED on 10/20 with concern of UTI. Patient's wife had noted an increase in urinary frequency and worsened confusion from baseline. In the ED, patient found with UTI by UA and was started on ceftriaxone . Labs otherwise reassuring. Patient underwent head CT given noted change in mental status/confusion. This was negative for acute process. Initial ED ECG with sinus bradycardia.    Patient previously followed by Dr. Claudene, also seen historically by Dr. Cindie in consult for Watchman given history of epistaxis. Ultimately patient elected to decline Watchman. In review of patient's chart, also noted that he was admitted in August of 2023 after outpatient heart monitor found evidence of intermittent complete heart block. EP was consulted during this admission and ultimately conservative management recommended, patient felt to be poor candidate for pacemaker given dementia. Notably patient also with UTI during this admission. Patient stable at subsequent EP clinic follow up.   Since time of admission, telemetry shows progression of conductive disease with patient now having frequent 2:1 heart block, Wenckebach, and complete heart block with intermittent 1:1 conduction. Patient pleasantly confused on exam, not able  to contribute to HPI. His daughter (who lives with patient and is a primary care giver) was at bedside and able to provide history. Patient generally minimally physically active at home, spend much of the day watching TV. He is able to feed himself but does require assistance with other ADLs such as getting dressed. Patient's daughter denies seeing any evidence of dizziness, exertional fatigue, shortness of breath.   Labs Potassium4.2 (10/22 9487) Magnesium   2.1 (10/22 1738) Creatinine, ser  1.25* (10/22 0512) PLT  220 (10/22 0512) HGB  13.0 (10/22 0512) WBC 5.5 (10/22 0512)  .    Past Medical History:  Diagnosis Date   Arthritis    Attention deficit disorder    Blind left eye    Diabetes mellitus without complication (HCC)    Hyperlipidemia    Hypertension      Surgical History:  Past Surgical History:  Procedure Laterality Date   EYE SURGERY Left    x3   KNEE ARTHROSCOPY Bilateral 03/29/2014   Procedure: RIGHT KNEE ARTHROSCOPY AND DEBRIDEMENT, LEFT KNEE ASPIRATION,  LEFT KNEE ARTHROSCOPY AND DEBRIDEMENT.;  Surgeon: Cordella Glendia Hutchinson, MD;  Location: [facility] OR;  Service: Orthopedics;  Laterality: Bilateral;  RIGHT KNEE ARTHROSCOPY AND DEBRIDEMENT, LEFT KNEE ASPIRATION,  LEFT KNEE ARTHROSCOPY AND DEBRIDEMENT.   KNEE ARTHROTOMY Bilateral 04/26/2014   Procedure: KNEE ARTHROTOMY, SYNOVECTOMY, ANTIBIOTIC BEAD PLACEMENT.;  Surgeon: Cordella Glendia Hutchinson, MD;  Location: [facility] OR;  Service: Orthopedics;  Laterality: Bilateral;   lipoma removal, right upper back     TONSILLECTOMY     TOTAL HIP ARTHROPLASTY Left 09/14/2019   TOTAL HIP ARTHROPLASTY Left 09/14/2019   Procedure: LEFT TOTAL HIP ARTHROPLASTY ANTERIOR APPROACH;  Surgeon: Hutchinson Cordella Glendia, MD;  Location: [facility] OR;  Service: Orthopedics;  Laterality: Left;     Medications Prior to Admission  Medication Sig Dispense Refill Last Dose/Taking   aspirin  EC 81 MG tablet Take 1 tablet (81 mg total) by mouth daily. Swallow whole.   09/06/2024  Morning   donepezil (ARICEPT) 5 MG tablet Take 5 mg by mouth at bedtime.   09/06/2024 Morning   magnesium  oxide (MAG-OX) 400 (240 Mg) MG tablet Take 0.5 tablets (200 mg total) by mouth 2 (two) times daily. 20 tablet 0 09/06/2024 Morning   mirtazapine  (REMERON ) 15 MG tablet Take 15 mg by mouth at bedtime.   Past Week   omeprazole (PRILOSEC) 20 MG capsule Take 20 mg by mouth 2 (two) times daily before a meal.   09/06/2024 Morning   QUEtiapine  (SEROQUEL ) 25 MG tablet Take 1 tablet (25 mg total) by mouth at bedtime. 20 tablet 0 09/06/2024 Morning    Inpatient Medications:   aspirin  EC  81 mg Oral Daily   feeding supplement  237 mL Oral BID BM   heparin   5,000 Units Subcutaneous Q12H   insulin  aspart  0-9 Units Subcutaneous TID WC   magnesium  oxide  200 mg Oral BID   mirtazapine   15 mg Oral QHS   pantoprazole  (PROTONIX ) IV  40 mg Intravenous Q12H   QUEtiapine   25 mg Oral QHS    Allergies: No Known Allergies  Family History  Problem Relation Age of Onset   Heart attack Father    Heart attack Sister    Heart attack Brother      Physical Exam: Vitals:   09/08/24 1959 09/08/24 2300 09/09/24 0312 09/09/24 0821  BP: (!) 113/95 (!) 118/55 (!) 118/52 (!) 146/71  Pulse: 69 62 (!) 26 (!) 34  Resp: 18  18 20   Temp: 98.9 F (37.2 C) 98 F (36.7 C) 98.3 F (36.8 C) 97.7 F (36.5 C)  TempSrc: Axillary Axillary Axillary Axillary  SpO2: 97% 100% 100% 100%  Weight:   71.5 kg   Height:        GEN- NAD, pleasantly confused HEENT: Normocephalic, atraumatic Lungs- CTAB, Normal effort.  Heart- Irregular rhythm, profound bradycardia, No M/G/R.  GI- Soft, NT, ND.  Extremities- No clubbing, cyanosis, or edema   Radiology/Studies: CT Head Wo Contrast Result Date: 09/06/2024 CLINICAL DATA:  Altered mental status EXAM: CT HEAD WITHOUT CONTRAST TECHNIQUE: Contiguous axial images were obtained from the base of the skull through the vertex without intravenous contrast. RADIATION DOSE REDUCTION:  This exam was performed according to the departmental dose-optimization program which includes automated exposure control, adjustment of the mA and/or kV according to patient size and/or use of iterative reconstruction technique. COMPARISON:  10/12/2019 FINDINGS: Brain: No evidence of acute infarction, hemorrhage, hydrocephalus, extra-axial collection or mass lesion/mass effect. Chronic atrophic and ischemic changes are noted. Vascular: No hyperdense vessel or unexpected calcification. Skull: Normal. Negative for fracture or focal lesion. Sinuses/Orbits: Paranasal sinuses are within normal limits. 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 Other: None IMPRESSION: Chronic atrophic and ischemic changes similar to that seen on the prior exam. No acute infarct is noted. Electronically Signed   By: Oneil Devonshire M.D.   On: 09/06/2024 20:25   DG Chest Port 1 View Result Date: 09/06/2024 CLINICAL DATA:  Questionable sepsis - evaluate for abnormality EXAM: PORTABLE CHEST - 1 VIEW COMPARISON:  07/16/2022 FINDINGS: No focal airspace consolidation, pleural effusion, or pneumothorax. No cardiomegaly. Tortuous aorta with aortic atherosclerosis. No acute fracture or destructive lesions. Multilevel thoracic osteophytosis. Changes suggestive  of chronic rotator cuff tears bilaterally. IMPRESSION: No acute cardiopulmonary abnormality. Electronically Signed   By: Rogelia Myers M.D.   On: 09/06/2024 18:55    EKG:09/08/24 ECG reviewed. HR 30s with 2:1 AVB (personally reviewed)  TELEMETRY: high grade AVB, 2:1 conduction and complete heart block with intermitted 1:1 conduction (personally reviewed)  DEVICE HISTORY: n/a  Assessment/Plan:  Bradycardia Intermittent high grade AVB Patient admitted with confusion and UTI found to have progressive bradycardia with high grade AVB and intermittent complete heart block. Of note, previously had similar HB with UTI in 2023. PPM deferred at that time with lack of symptoms and acute infection.  Today's telemetry shows CHB with occasional 1:1 conduction. Given dementia, difficult to assess symptoms but per daughter, no evidence of dizziness, exertional fatigue, dyspnea. Limited physical activity at baseline. Long discussion with family regarding pacemaker options including leadless device. Family wishes to defer device at this time given that reason for ED visit was UTI not cardiac symptoms and concern about sedation worsening his dementia. With limited baseline physical activity and comorbidities, this is a reasonable decision. Family aware of significant risk for syncope/falls. Advised family to call our office if they change their mind in the future about a device. Avoid all AV nodal agents.   Paroxysmal atrial fibrillation Patient with longstanding hx paroxysmal afib. Has not been anticoagulated due to epistaxis, previously considered for Watchman but patient later declined. No evidence of afib this admission.    For questions or updates, please contact [facility] Please consult [email or web address] for contact info under     Signed, Artist Pouch, PA-C  09/09/2024, 8:59 AM

## 2024-09-09 NOTE — Plan of Care (Signed)
  Problem: Nutrition: Goal: Adequate nutrition will be maintained Outcome: Progressing   Problem: Safety: Goal: Ability to remain free from injury will improve Outcome: Progressing   Problem: Education: Goal: Knowledge of General Education information will improve Description: Including pain rating scale, medication(s)/side effects and non-pharmacologic comfort measures Outcome: Progressing  Problem: Activity: Goal: Risk for activity intolerance will decrease Outcome: Not Progressing

## 2024-09-10 ENCOUNTER — Inpatient Hospital Stay (HOSPITAL_COMMUNITY)
Admission: EM | Disposition: A | Discharge status: Home / Self Care | Payer: Self-pay | Source: Home / Self Care | Attending: Family Medicine

## 2024-09-10 ENCOUNTER — Inpatient Hospital Stay (HOSPITAL_COMMUNITY): Admitting: Anesthesiology

## 2024-09-10 ENCOUNTER — Encounter (HOSPITAL_COMMUNITY): Payer: Self-pay | Admitting: Internal Medicine

## 2024-09-10 DIAGNOSIS — Z87891 Personal history of nicotine dependence: Secondary | ICD-10-CM | POA: Diagnosis not present

## 2024-09-10 DIAGNOSIS — R4182 Altered mental status, unspecified: Secondary | ICD-10-CM | POA: Diagnosis not present

## 2024-09-10 DIAGNOSIS — Z006 Encounter for examination for normal comparison and control in clinical research program: Secondary | ICD-10-CM | POA: Diagnosis not present

## 2024-09-10 DIAGNOSIS — I442 Atrioventricular block, complete: Secondary | ICD-10-CM | POA: Diagnosis not present

## 2024-09-10 DIAGNOSIS — I480 Paroxysmal atrial fibrillation: Secondary | ICD-10-CM

## 2024-09-10 DIAGNOSIS — R001 Bradycardia, unspecified: Secondary | ICD-10-CM | POA: Diagnosis not present

## 2024-09-10 DIAGNOSIS — I10 Essential (primary) hypertension: Secondary | ICD-10-CM

## 2024-09-10 DIAGNOSIS — N3000 Acute cystitis without hematuria: Secondary | ICD-10-CM | POA: Diagnosis not present

## 2024-09-10 HISTORY — PX: PACEMAKER LEADLESS INSERTION: EP1219

## 2024-09-10 LAB — BASIC METABOLIC PANEL WITH GFR
Anion gap: 13 (ref 5–15)
BUN: 16 mg/dL (ref 8–23)
CO2: 22 mmol/L (ref 22–32)
Calcium: 8.8 mg/dL — ABNORMAL LOW (ref 8.9–10.3)
Chloride: 103 mmol/L (ref 98–111)
Creatinine, Ser: 1.12 mg/dL (ref 0.61–1.24)
GFR, Estimated: >60 mL/min (ref >60)
Glucose, Bld: 95 mg/dL (ref 70–99)
Potassium: 3.9 mmol/L (ref 3.5–5.1)
Sodium: 138 mmol/L (ref 135–145)

## 2024-09-10 LAB — MAGNESIUM: Magnesium: 2 mg/dL (ref 1.7–2.4)

## 2024-09-10 LAB — CBC
HCT: 42 % (ref 39.0–52.0)
Hemoglobin: 13.7 g/dL (ref 13.0–17.0)
MCH: 28.8 pg (ref 26.0–34.0)
MCHC: 32.6 g/dL (ref 30.0–36.0)
MCV: 88.2 fL (ref 80.0–100.0)
Platelets: 234 K/uL (ref 150–400)
RBC: 4.76 MIL/uL (ref 4.22–5.81)
RDW: 12.6 % (ref 11.5–15.5)
WBC: 5.2 K/uL (ref 4.0–10.5)
nRBC: 0 % (ref 0.0–0.2)

## 2024-09-10 LAB — GLUCOSE, CAPILLARY
Glucose-Capillary: 73 mg/dL (ref 70–99)
Glucose-Capillary: 72 mg/dL (ref 70–99)
Glucose-Capillary: 109 mg/dL — ABNORMAL HIGH (ref 70–99)

## 2024-09-10 SURGERY — PACEMAKER LEADLESS INSERTION
Anesthesia: Monitor Anesthesia Care

## 2024-09-10 MED ORDER — CHLORHEXIDINE GLUCONATE 4 % EX SOLN
60.0000 mL | Freq: Once | CUTANEOUS | Status: AC
  Administered 2024-09-10: 4 via TOPICAL

## 2024-09-10 MED ORDER — CEFAZOLIN SODIUM-DEXTROSE 2-3 GM-%(50ML) IV SOLR
INTRAVENOUS | Status: DC | PRN
Start: 2024-09-10 — End: 2024-09-10
  Administered 2024-09-10: 2 g via INTRAVENOUS

## 2024-09-10 MED ORDER — ACETAMINOPHEN 325 MG PO TABS: 650.0000 mg | ORAL_TABLET | ORAL | Status: DC | PRN

## 2024-09-10 MED ORDER — CEFAZOLIN SODIUM-DEXTROSE 2-4 GM/100ML-% IV SOLN
INTRAVENOUS | Status: AC
  Filled 2024-09-10: qty 100

## 2024-09-10 MED ORDER — SODIUM CHLORIDE 0.9 % IV SOLN: INTRAVENOUS | Status: DC

## 2024-09-10 MED ORDER — POVIDONE-IODINE 10 % EX SWAB: 2.0000 | Freq: Once | CUTANEOUS | Status: DC

## 2024-09-10 MED ORDER — SODIUM CHLORIDE 0.9% FLUSH: 3.0000 mL | INTRAVENOUS | Status: DC | PRN

## 2024-09-10 MED ORDER — CEFAZOLIN SODIUM-DEXTROSE 2-4 GM/100ML-% IV SOLN
2.0000 g | INTRAVENOUS | Status: AC
  Administered 2024-09-10: 2 g via INTRAVENOUS
  Filled 2024-09-10: qty 100

## 2024-09-10 MED ORDER — SODIUM BICARBONATE 8.4 % IV SOLN
INTRAVENOUS | Status: AC
  Filled 2024-09-10: qty 50

## 2024-09-10 MED ORDER — SODIUM CHLORIDE 0.9 % IV SOLN
80.0000 mg | INTRAVENOUS | Status: DC
  Filled 2024-09-10: qty 2

## 2024-09-10 MED ORDER — BUPIVACAINE HCL (PF) 0.25 % IJ SOLN
INTRAMUSCULAR | Status: DC | PRN
  Administered 2024-09-10: 20 mL

## 2024-09-10 MED ORDER — BUPIVACAINE HCL (PF) 0.25 % IJ SOLN
INTRAMUSCULAR | Status: AC
  Filled 2024-09-10: qty 30

## 2024-09-10 MED ORDER — SODIUM CHLORIDE 0.9% FLUSH
3.0000 mL | Freq: Two times a day (BID) | INTRAVENOUS | Status: DC
  Administered 2024-09-10 – 2024-09-11 (×2): 3 mL via INTRAVENOUS

## 2024-09-10 MED ORDER — PHENYLEPHRINE HCL-NACL 20-0.9 MG/250ML-% IV SOLN
INTRAVENOUS | Status: DC | PRN
  Administered 2024-09-10: 50 ug/min via INTRAVENOUS

## 2024-09-10 MED ORDER — PROPOFOL 10 MG/ML IV BOLUS
INTRAVENOUS | Status: DC | PRN
  Administered 2024-09-10: 50 mg via INTRAVENOUS
  Administered 2024-09-10: 75 ug/kg/min via INTRAVENOUS

## 2024-09-10 MED ORDER — FENTANYL CITRATE (PF) 100 MCG/2ML IJ SOLN
INTRAMUSCULAR | Status: DC | PRN
  Administered 2024-09-10: 25 ug via INTRAVENOUS

## 2024-09-10 MED ORDER — IOHEXOL 350 MG/ML SOLN
INTRAVENOUS | Status: DC | PRN
  Administered 2024-09-10: 50 mL via INTRAVENOUS

## 2024-09-10 MED ORDER — HEPARIN (PORCINE) IN NACL 1000-0.9 UT/500ML-% IV SOLN
INTRAVENOUS | Status: DC | PRN
  Administered 2024-09-10 (×2): 500 mL

## 2024-09-10 MED ORDER — SODIUM CHLORIDE 0.9 % IV SOLN
250.0000 mL | INTRAVENOUS | Status: DC | PRN
Start: 2024-09-10 — End: 2024-09-11

## 2024-09-10 MED ORDER — CHLORHEXIDINE GLUCONATE 4 % EX SOLN
60.0000 mL | Freq: Once | CUTANEOUS | Status: AC
  Administered 2024-09-10: 4 via TOPICAL
  Filled 2024-09-10: qty 15

## 2024-09-10 MED ORDER — HEPARIN SODIUM (PORCINE) 5000 UNIT/ML IJ SOLN
5000.0000 [IU] | Freq: Three times a day (TID) | INTRAMUSCULAR | Status: DC
  Administered 2024-09-11 (×2): 5000 [IU] via SUBCUTANEOUS
  Filled 2024-09-10 (×2): qty 1

## 2024-09-10 MED ORDER — ONDANSETRON HCL 4 MG/2ML IJ SOLN: 4.0000 mg | Freq: Four times a day (QID) | INTRAMUSCULAR | Status: DC | PRN

## 2024-09-10 MED ORDER — FENTANYL CITRATE (PF) 100 MCG/2ML IJ SOLN
INTRAMUSCULAR | Status: AC
  Filled 2024-09-10: qty 2

## 2024-09-10 MED ORDER — HEPARIN SODIUM (PORCINE) 1000 UNIT/ML IJ SOLN
INTRAMUSCULAR | Status: DC | PRN
  Administered 2024-09-10: 3000 [IU] via INTRAVENOUS

## 2024-09-10 SURGICAL SUPPLY — 9 items
CABLE SURGICAL S-101-97-12 (CABLE) ×2 IMPLANT
CLOSURE PERCLOSE PROSTYLE (Vascular Products) IMPLANT
PACEMAKER LEADLESS AV2 MICRA (Pacemaker) IMPLANT
PAD DEFIB RADIO PHYSIO CONN (PAD) ×2 IMPLANT
SHEATH DILAT COONS TAPER 22F (SHEATH) IMPLANT
SHEATH INTRODUCER MICRA (SHEATH) IMPLANT
SHEATH PINNACLE 9F 10CM (SHEATH) IMPLANT
TRAY PACEMAKER INSERTION (PACKS) ×2 IMPLANT
WIRE AMPLATZ SS-J .035X260CM (WIRE) IMPLANT

## 2024-09-10 NOTE — Plan of Care (Signed)
   Problem: Clinical Measurements: Goal: Will remain free from infection Outcome: Adequate for Discharge

## 2024-09-10 NOTE — Progress Notes (Signed)
  Patient Name: [name] Date of Encounter: 09/10/2024  Primary Cardiologist: None Electrophysiologist: OLE ONEIDA HOLTS, MD  Interval Summary   Patient pleasantly confused this morning. Does not appear in any distress.    Vital Signs    Vitals:   09/09/24 2002 09/10/24 0000 09/10/24 0500 09/10/24 0743  BP: (!) 131/53 126/64  133/63  Pulse: 76 64  (!) 45  Resp: 16 18  16   Temp: 99.1 F (37.3 C) (!) 97.5 F (36.4 C)  98.3 F (36.8 C)  TempSrc: Axillary Axillary  Oral  SpO2: 99% 98%  100%  Weight:   70.7 kg   Height:        Intake/Output Summary (Last 24 hours) at 09/10/2024 0917 Last data filed at 09/10/2024 9362 Gross per 24 hour  Intake 0 ml  Output 1200 ml  Net -1200 ml   Filed Weights   09/08/24 0500 09/09/24 0312 09/10/24 0500  Weight: 71 kg 71.5 kg 70.7 kg    Physical Exam    GEN- NAD, Alert. Pleasantly confused. Lungs- normal work of breathing Cardiac- Regular rhythm, profound bradycardia GI- soft, NT, ND, + BS Extremities- no clubbing or cyanosis. No edema  Telemetry    high grade AVB, 2:1 conduction and complete heart block with intermitted 1:1 conduction (personally reviewed)  Hospital Course    [name] is a 83 y.o. male with a history of paroxysmal atrial fibrillation (not on OAC 2/2 epistaxis), Wenckebach, hyperlipidemia, DM type II, prior DVT, dementia who is being seen today for the evaluation of heart block at the request of Dr. Floretta.   Assessment & Plan    Bradycardia High grade AVB Complete Heart Block  Admitted with confusion and UTI found to have progressive bradycardia with high grade AVB and intermittent complete heart block. Of note, previously had similar HB with UTI in 2023. PPM deferred at that time with lack of symptoms and acute infection. EP asked to see this admission for consideration of PPM. With initial consult on 10/23, patient's family elected to defer leadless PPM placement citing lack of symptoms  and concern for sedation worsening confusion. Later in the day, patient's family reached back out to Dr. Almetta and expressed a change of mind and desire to proceed with leadless PPM placement.  Leadless PPM this afternoon with Dr. Almetta pending lab availability.  Holding ASA this morning  Paroxysmal atrial fibrillation Patient with longstanding hx paroxysmal afib. Has not been anticoagulated due to epistaxis, previously considered for Watchman but patient later declined. No evidence of afib this admission.   UTI Management per primary team.    For questions or updates, please contact [facility] Please consult [email or web address] for contact info under     Signed, Artist Pouch, PA-C  09/10/2024, 9:17 AM

## 2024-09-10 NOTE — Anesthesia Preprocedure Evaluation (Addendum)
 Anesthesia Evaluation  Patient identified by MRN, date of birth, ID band Patient confused    Reviewed: Allergy & Precautions, NPO status , Patient's Chart, lab work & pertinent test results  Airway Mallampati: II       Dental no notable dental hx.    Pulmonary former smoker   Pulmonary exam normal        Cardiovascular hypertension, + DVT  Normal cardiovascular exam+ dysrhythmias Atrial Fibrillation   EKG 09/08/24 Complete HB Junctional escape rhythm   Neuro/Psych  PSYCHIATRIC DISORDERS     Dementia ADDBlind OD    GI/Hepatic Neg liver ROS,GERD  Medicated and Controlled,,  Endo/Other  diabetes, Well Controlled, Type 2  HLD  Renal/GU Renal disease     Musculoskeletal  (+) Arthritis , Osteoarthritis,    Abdominal   Peds  Hematology  (+) Blood dyscrasia, anemia   Anesthesia Other Findings Heart block AV complete  Reproductive/Obstetrics                              Anesthesia Physical Anesthesia Plan  ASA: 3  Anesthesia Plan: MAC   Post-op Pain Management: Minimal or no pain anticipated   Induction: Intravenous  PONV Risk Score and Plan: 1 and Treatment may vary due to age or medical condition  Airway Management Planned: Simple Face Mask  Additional Equipment:   Intra-op Plan:   Post-operative Plan:   Informed Consent: I have reviewed the patients History and Physical, chart, labs and discussed the procedure including the risks, benefits and alternatives for the proposed anesthesia with the patient or authorized representative who has indicated his/her understanding and acceptance.     Dental advisory given and Consent reviewed with POA  Plan Discussed with: CRNA, Anesthesiologist and Surgeon  Anesthesia Plan Comments:          Anesthesia Quick Evaluation

## 2024-09-10 NOTE — Interval H&P Note (Signed)
 History and Physical Interval Note:  09/10/2024 4:41 PM  [name]  has presented today for surgery, with the diagnosis of HB.  The various methods of treatment have been discussed with the patient and family. After consideration of risks, benefits and other options for treatment, the patient has consented to  Procedure(s): PACEMAKER LEADLESS INSERTION (N/A) as a surgical intervention.  The patient's history has been reviewed, patient examined, no change in status, stable for surgery.  I have reviewed the patient's chart and labs.  Questions were answered to the patient's satisfaction.     [name]

## 2024-09-10 NOTE — Transfer of Care (Signed)
 Immediate Anesthesia Transfer of Care Note  Patient: MICHAEAL DAVIS  Procedure(s) Performed: PACEMAKER LEADLESS INSERTION  Patient Location: PACU and Cath Lab  Anesthesia Type:MAC  Level of Consciousness: awake  Airway & Oxygen Therapy: Patient Spontanous Breathing and Patient connected to face mask oxygen  Post-op Assessment: Report given to RN and Post -op Vital signs reviewed and stable  Post vital signs: Reviewed and stable  Last Vitals:  Vitals Value Taken Time  BP 145/81 09/10/24 17:45  Temp    Pulse 56 09/10/24 17:45  Resp 39 09/10/24 17:48  SpO2 93 % 09/10/24 17:45  Vitals shown include unfiled device data.  Last Pain:  Vitals:   09/10/24 1510  TempSrc: Axillary  PainSc:          Complications: No notable events documented.

## 2024-09-10 NOTE — Op Note (Signed)
   Procedure:  US  Venous Access (x1)  MDT Micra Implant  Pre-Op Diagnosis: high degree AVB   Post-Op Diagnosis: same   Procedure Date: 09/10/24   Attending: Adina Primus, MD   Anesthesia: monitored anesthesia care   Operative Report: RIGHT groin prepped with chlorhexidine  scrub. 20 cc lidocaine  injected. Using ultrasound, the RIGHT femoral vein was visualized to be widely patent. During real-time ultrasound, the needle was seen directly puncturing the vein. Using the modified Seldinger technique, a wire was advanced and 8 Fr sheath placed into the vessel. Ultrasound images from vascular access were recorded and retained on the system. Serial dilation up to 16 Fr performed with a Coons dilator and the 27 Fr Micra sheath was advanced under fluoro guidance to the RA. 3000 units of heparin  was given. The Micra device and positioning sheath was advanced across the tricuspid valve, aimed toward the mid septum. This was confirmed with contrast injection in both the RAO and LAO views. The Micra was then deployed. The following measurements were made:  Sensing: 8.4 mV Impedance: 790 ohms  Threshold: 0.5 V @ 0.4 msec   Tug test confirmed engagement of at least 2 tines and the pacing numbers were unchanged. The retention suture was cut and removed. Perclose ProStyle x2 was used to preclose the venotomy site and the sutures were secured with immediate hemostasis.  EBL: less than 50 mL  Implanted Hardware: Implant Name Type Inv. Item Serial No. Manufacturer Lot No. LRB No. Used Action  CLOSURE PERCLOSE PROSTYLE - [id number] Vascular Products CLOSURE PERCLOSE PROSTYLE  [employer] [id number] N/A 1 Implanted  CLOSURE PERCLOSE PROSTYLE - [id number] Vascular Products CLOSURE PERCLOSE PROSTYLE  [employer] [id number] N/A 1 Implanted  PACEMAKER LEADLESS AV2 MICRA - [id number] E Pacemaker PACEMAKER LEADLESS AV2 MICRA [id number] [employer]  N/A 1 Implanted   Device  Information: Leadless PPM: MDT Aviva [name] SN: [id number], DOI 09/10/24  Complications: none  Disposition: Step Down - stable  Summary: 1. Successful implant of MDT Micra leadless PPM   Recommendations: Routine post-procedure care with bedrest for 4 hours  S/p Perclose ProStyle closure x2 for RFV single access, dermabond for skin incision  PA/lateral CXR in AM Wound check in AM Device interrogation in AM EP f/u to be scheduled  Brandon DELENA Primus, MD [facility]  Cardiac Electrophysiology

## 2024-09-10 NOTE — H&P (View-Only) (Signed)
  Patient Name: [name] Date of Encounter: 09/10/2024  Primary Cardiologist: None Electrophysiologist: OLE ONEIDA HOLTS, MD  Interval Summary   Patient pleasantly confused this morning. Does not appear in any distress.    Vital Signs    Vitals:   09/09/24 2002 09/10/24 0000 09/10/24 0500 09/10/24 0743  BP: (!) 131/53 126/64  133/63  Pulse: 76 64  (!) 45  Resp: 16 18  16   Temp: 99.1 F (37.3 C) (!) 97.5 F (36.4 C)  98.3 F (36.8 C)  TempSrc: Axillary Axillary  Oral  SpO2: 99% 98%  100%  Weight:   70.7 kg   Height:        Intake/Output Summary (Last 24 hours) at 09/10/2024 0917 Last data filed at 09/10/2024 9362 Gross per 24 hour  Intake 0 ml  Output 1200 ml  Net -1200 ml   Filed Weights   09/08/24 0500 09/09/24 0312 09/10/24 0500  Weight: 71 kg 71.5 kg 70.7 kg    Physical Exam    GEN- NAD, Alert. Pleasantly confused. Lungs- normal work of breathing Cardiac- Regular rhythm, profound bradycardia GI- soft, NT, ND, + BS Extremities- no clubbing or cyanosis. No edema  Telemetry    high grade AVB, 2:1 conduction and complete heart block with intermitted 1:1 conduction (personally reviewed)  Hospital Course    [name] is a 83 y.o. male with a history of paroxysmal atrial fibrillation (not on OAC 2/2 epistaxis), Wenckebach, hyperlipidemia, DM type II, prior DVT, dementia who is being seen today for the evaluation of heart block at the request of Dr. Floretta.   Assessment & Plan    Bradycardia High grade AVB Complete Heart Block  Admitted with confusion and UTI found to have progressive bradycardia with high grade AVB and intermittent complete heart block. Of note, previously had similar HB with UTI in 2023. PPM deferred at that time with lack of symptoms and acute infection. EP asked to see this admission for consideration of PPM. With initial consult on 10/23, patient's family elected to defer leadless PPM placement citing lack of symptoms  and concern for sedation worsening confusion. Later in the day, patient's family reached back out to Dr. Almetta and expressed a change of mind and desire to proceed with leadless PPM placement.  Leadless PPM this afternoon with Dr. Almetta pending lab availability.  Holding ASA this morning  Paroxysmal atrial fibrillation Patient with longstanding hx paroxysmal afib. Has not been anticoagulated due to epistaxis, previously considered for Watchman but patient later declined. No evidence of afib this admission.   UTI Management per primary team.    For questions or updates, please contact [facility] Please consult [email or web address] for contact info under     Signed, Artist Pouch, PA-C  09/10/2024, 9:17 AM

## 2024-09-10 NOTE — Progress Notes (Signed)
 [facility]  PROGRESS NOTE  [name] FMW:[id number] DOB: 1940-11-23 DOA: 09/06/2024 PCP: Cleotilde Planas, MD   Brief HPI:    83 y.o. male with past medical history  of complete heart block, PAF, diabetes mellitus type 2, GERD, septic joint history, DVT history, history of ABLA, history of AKI, mixed type,Anemia, mixed hyperlipidemia brought from [facility] for disorientation attributed to urinary tract infection, head CT negative for any acute findings, abnormal urinalysis as shown below.    Assessment/Plan:    Altered mental status/UTI -Patient found to have abnormal UA -Started on IV Rocephin  -Urine culture not obtained on admission - Urine culture obtained on 10/22, follow urine culture results   Diabetes mellitus type 2 - Metformin  on hold - Continue sliding scale insulin  with NovoLog   Paroxysmal atrial fibrillation/bradycardia/first-degree AV block -Heart rate controlled, not on anticoagulation due to history of recurrent epistaxis and patient choice as per cardiology -Cardiology was consulted, had signed off - Central telemetry called that patient was having intermittent episodes of complete heart block - Cardiology was reconsulted, initially patient's wife had refused pacemaker placement - Last night she agreed for pacemaker placement.  EP has been called, plan for pacemaker placement today.  Hypertension - Blood pressure is stable - Continue to hold lisinopril   Dementia with behavioral disturbance - Continue Seroquel ,  Remeron  - Aricept discontinued as this can cause bradycardia    DVT prophylaxis: Heparin   Medications     aspirin  EC  81 mg Oral Daily   feeding supplement  237 mL Oral BID BM   insulin  aspart  0-9 Units Subcutaneous TID WC   magnesium  oxide  200 mg Oral BID   mirtazapine   15 mg Oral QHS   pantoprazole   40 mg Oral Daily   QUEtiapine   25 mg Oral QHS     Data Reviewed:   CBG:  Recent Labs  Lab 09/09/24 1025  09/09/24 1146 09/09/24 1612 09/09/24 2141 09/10/24 0804  GLUCAP 79 86 95 96 73    SpO2: 100 %    Vitals:   09/09/24 2002 09/10/24 0000 09/10/24 0500 09/10/24 0743  BP: (!) 131/53 126/64  133/63  Pulse: 76 64  (!) 45  Resp: 16 18  16   Temp: 99.1 F (37.3 C) (!) 97.5 F (36.4 C)  98.3 F (36.8 C)  TempSrc: Axillary Axillary  Oral  SpO2: 99% 98%  100%  Weight:   70.7 kg   Height:          Data Reviewed:  Basic Metabolic Panel: Recent Labs  Lab 09/06/24 1702 09/08/24 0512 09/08/24 1738 09/10/24 0412  NA 140 138  --  138  K 4.4 4.2  --  3.9  CL 103 109  --  103  CO2 25 21*  --  22  GLUCOSE 109* 108*  --  95  BUN 24* 21  --  16  CREATININE 1.27* 1.25*  --  1.12  CALCIUM 9.4 8.5*  --  8.8*  MG  --   --  2.1 2.0    CBC: Recent Labs  Lab 09/06/24 1702 09/08/24 0512 09/10/24 0412  WBC 6.9 5.5 5.2  HGB 14.7 13.0 13.7  HCT 45.5 40.5 42.0  MCV 89.7 89.8 88.2  PLT 292 220 234    LFT Recent Labs  Lab 09/06/24 1702 09/08/24 0512  AST 23 17  ALT 7 8  ALKPHOS 65 44  BILITOT 0.4 0.4  PROT 8.2* 6.3*  ALBUMIN 4.3 2.8*     Antibiotics:  Anti-infectives (From admission, onward)    Start     Dose/Rate Route Frequency Ordered Stop   09/07/24 1515  cefTRIAXone  (ROCEPHIN ) 2 g in sodium chloride  0.9 % 100 mL IVPB        2 g 200 mL/hr over 30 Minutes Intravenous Every 24 hours 09/07/24 1428     09/06/24 2345  cefTRIAXone  (ROCEPHIN ) 2 g in sodium chloride  0.9 % 100 mL IVPB        2 g 200 mL/hr over 30 Minutes Intravenous  Once 09/06/24 2335 09/07/24 0027        CONSULTS cardiology  Code Status: Full code  Family Communication: Granddaughter at bedside     Subjective   No new complaints.  Finally patient wife has agreed for pacemaker placement.  Plan for pacemaker placement today   Objective    Physical Examination:  Appears in no acute distress S1-S2, regular Lungs clear to auscultation bilaterally Abdomen is soft, nontender  Status  is: Inpatient:             [name]   [facility] If 7PM-7AM, please contact night-coverage at [email or web address], Office  [phone]   09/10/2024, 8:57 AM  LOS: 3 days

## 2024-09-10 NOTE — Discharge Instructions (Signed)

## 2024-09-11 ENCOUNTER — Encounter (HOSPITAL_COMMUNITY): Payer: Self-pay | Admitting: Student in an Organized Health Care Education/Training Program

## 2024-09-11 ENCOUNTER — Inpatient Hospital Stay (HOSPITAL_COMMUNITY)

## 2024-09-11 DIAGNOSIS — I480 Paroxysmal atrial fibrillation: Secondary | ICD-10-CM | POA: Diagnosis not present

## 2024-09-11 DIAGNOSIS — E785 Hyperlipidemia, unspecified: Secondary | ICD-10-CM

## 2024-09-11 DIAGNOSIS — N3000 Acute cystitis without hematuria: Secondary | ICD-10-CM | POA: Diagnosis not present

## 2024-09-11 DIAGNOSIS — R4182 Altered mental status, unspecified: Secondary | ICD-10-CM | POA: Diagnosis not present

## 2024-09-11 DIAGNOSIS — F0390 Unspecified dementia without behavioral disturbance: Secondary | ICD-10-CM | POA: Diagnosis not present

## 2024-09-11 DIAGNOSIS — I442 Atrioventricular block, complete: Secondary | ICD-10-CM | POA: Diagnosis not present

## 2024-09-11 DIAGNOSIS — Z950 Presence of cardiac pacemaker: Secondary | ICD-10-CM | POA: Diagnosis not present

## 2024-09-11 DIAGNOSIS — F03918 Unspecified dementia, unspecified severity, with other behavioral disturbance: Secondary | ICD-10-CM | POA: Diagnosis not present

## 2024-09-11 DIAGNOSIS — E119 Type 2 diabetes mellitus without complications: Secondary | ICD-10-CM | POA: Diagnosis not present

## 2024-09-11 LAB — BASIC METABOLIC PANEL WITH GFR
Anion gap: 11 (ref 5–15)
BUN: 14 mg/dL (ref 8–23)
CO2: 23 mmol/L (ref 22–32)
Calcium: 8.9 mg/dL (ref 8.9–10.3)
Chloride: 101 mmol/L (ref 98–111)
Creatinine, Ser: 1.33 mg/dL — ABNORMAL HIGH (ref 0.61–1.24)
GFR, Estimated: 53 mL/min — ABNORMAL LOW (ref >60)
Glucose, Bld: 121 mg/dL — ABNORMAL HIGH (ref 70–99)
Potassium: 4 mmol/L (ref 3.5–5.1)
Sodium: 135 mmol/L (ref 135–145)

## 2024-09-11 LAB — GLUCOSE, CAPILLARY
Glucose-Capillary: 106 mg/dL — ABNORMAL HIGH (ref 70–99)
Glucose-Capillary: 90 mg/dL (ref 70–99)

## 2024-09-11 LAB — CULTURE, BLOOD (ROUTINE X 2)
Culture: NO GROWTH
Special Requests: ADEQUATE

## 2024-09-11 NOTE — Anesthesia Postprocedure Evaluation (Signed)
 Anesthesia Post Note  Patient: [name]  Procedure(s) Performed: PACEMAKER LEADLESS INSERTION     Patient location during evaluation: PACU Anesthesia Type: MAC Level of consciousness: awake and alert Pain management: pain level controlled Vital Signs Assessment: post-procedure vital signs reviewed and stable Respiratory status: spontaneous breathing, nonlabored ventilation, respiratory function stable and patient connected to nasal cannula oxygen Cardiovascular status: stable and blood pressure returned to baseline Postop Assessment: no apparent nausea or vomiting Anesthetic complications: no   No notable events documented.  Last Vitals:  Vitals:   09/11/24 0010 09/11/24 0455  BP: 111/71   Pulse: 78   Resp: 19 14  Temp: 36.7 C (!) 38.2 C  SpO2: 99%     Last Pain:  Vitals:   09/11/24 0455  TempSrc: Axillary  PainSc:                  Thom JONELLE Peoples

## 2024-09-11 NOTE — Discharge Summary (Signed)
 Physician Discharge Summary   Patient: [name] MRN: [id number] DOB: 02/16/1941  Admit date:     09/06/2024  Discharge date: 09/11/24  Discharge Physician: [name]   PCP: Cleotilde Planas, MD   Recommendations at discharge:  {Tip this will not be part of the note when signed- Example include specific recommendations for outpatient follow-up, pending tests to follow-up on. (Optional):26781} Follow-up cardiology as outpatient in 2 weeks  Discharge Diagnoses: Principal Problem:   AMS (altered mental status) Active Problems:   Heart block AV complete (HCC)  Resolved Problems:   * No resolved hospital problems. *  Hospital Course: 83 y.o. male with past medical history  of complete heart block, PAF, diabetes mellitus type 2, GERD, septic joint history, DVT history, history of ABLA, history of AKI, mixed type,Anemia, mixed hyperlipidemia brought from [facility] for disorientation attributed to urinary tract infection, head CT negative for any acute findings, abnormal urinalysis as shown below.   Assessment and Plan:  Altered mental status/UTI -Patient found to have abnormal UA -Started on IV Rocephin ; will complete 5 days of treatment in the hospital. -Urine culture not obtained on admission - Urine culture obtained on 10/22 was negative     Diabetes mellitus type 2 - Patient is not on medications at home.  Hemoglobin A1c was 5.7 -Blood sugar was well-controlled in the hospital   Paroxysmal atrial fibrillation/bradycardia/first-degree AV block -Heart rate controlled, not on anticoagulation due to history of recurrent epistaxis and patient choice as per cardiology -Cardiology was consulted, had signed off - Central telemetry called that patient was having intermittent episodes of complete heart block - Cardiology was reconsulted, initially patient's wife had refused pacemaker placement -  patient underwent pacemaker placement per EP cardiology. -Plan to  follow-up with EP cardiology as outpatient   Hypertension - Blood pressure is stable - Not on medications at home   Dementia with behavioral disturbance - Continue Seroquel ,  Remeron  - Aricept discontinued in the hospital as this can cause bradycardia - Note patient has pacemaker, will restart Aricept    {Tip this will not be part of the note when signed Body mass index is 22.56 kg/m. , ,  (Optional):26781}   Consultants: Cardiology Procedures performed:   Disposition: Home Diet recommendation:  Discharge Diet Orders (From admission, onward)     Start     Ordered   09/11/24 0000  Diet - low sodium heart healthy        09/11/24 1324           Regular diet DISCHARGE MEDICATION: Allergies as of 09/11/2024   No Known Allergies      Medication List     TAKE these medications    aspirin  EC 81 MG tablet Take 1 tablet (81 mg total) by mouth daily. Swallow whole.   donepezil 5 MG tablet Commonly known as: ARICEPT Take 5 mg by mouth at bedtime.   magnesium  oxide 400 (240 Mg) MG tablet Commonly known as: MAG-OX Take 0.5 tablets (200 mg total) by mouth 2 (two) times daily.   mirtazapine  15 MG tablet Commonly known as: REMERON  Take 15 mg by mouth at bedtime.   omeprazole 20 MG capsule Commonly known as: PRILOSEC Take 20 mg by mouth 2 (two) times daily before a meal.   QUEtiapine  25 MG tablet Commonly known as: SEROQUEL  Take 1 tablet (25 mg total) by mouth at bedtime.        Follow-up Information     [facility], [facility]  Follow up.   Specialty: Home Health Services Why: [facility] will provide home health physical therapy. Someone from the agency will be contacting you to arrange your first appointment. Contact information: [address] [place] [address] [phone]         Kennyth Chew, MD Follow up in 2 week(s).   Specialties: Cardiology, Radiology Why: Call for appointment Contact information: [address] [place] [address] [phone]                Discharge Exam: Fredricka Weights   09/09/24 0312 09/10/24 0500 09/11/24 0500  Weight: 71.5 kg 70.7 kg 69.3 kg   General-appears in no acute distress Heart-S1-S2, regular, no murmur auscultated Lungs-clear to auscultation bilaterally, no wheezing or crackles auscultated Abdomen-soft, nontender, no organomegaly Extremities-no edema in the lower extremities Neuro-alert, oriented x3, no focal deficit noted  Condition at discharge: good  The results of significant diagnostics from this hospitalization (including imaging, microbiology, ancillary and laboratory) are listed below for reference.   Imaging Studies: DG Chest 1 View Result Date: 09/11/2024 EXAM: 1 VIEW(S) XRAY OF THE CHEST 09/11/2024 06:06:55 AM COMPARISON: 09/06/2024 CLINICAL HISTORY: Pacemaker, AMS FINDINGS: LUNGS AND PLEURA: No focal pulmonary opacity. No pulmonary edema. No pleural effusion. No pneumothorax. HEART AND MEDIASTINUM: Loop recorder device in left chest. Coronary artery stents noted. No acute abnormality of the cardiac and mediastinal silhouettes. BONES AND SOFT TISSUES: Degenerative changes of the shoulders. No acute osseous abnormality. IMPRESSION: 1. No acute cardiopulmonary process identified. Electronically signed by: Waddell Calk MD 09/11/2024 07:32 AM EDT RP Workstation: [id number]   EP PPM/ICD IMPLANT Result Date: 09/10/2024 Summary: 1. Successful implant of MDT Micra leadless PPM  Recommendations: 1. Routine post-procedure care with bedrest for 4 hours 2. S/p Perclose ProStyle closure x2 for RFV single access, dermabond for skin incision 3. PA/lateral CXR in AM 4. Wound check in AM 5. Device interrogation in AM 6. EP f/u to be scheduled  [name], MD [facility] Cardiac Electrophysiology    CT Head Wo Contrast Result Date: 09/06/2024 CLINICAL DATA:  Altered mental status EXAM: CT HEAD WITHOUT CONTRAST TECHNIQUE:  Contiguous axial images were obtained from the base of the skull through the vertex without intravenous contrast. RADIATION DOSE REDUCTION: This exam was performed according to the departmental dose-optimization program which includes automated exposure control, adjustment of the mA and/or kV according to patient size and/or use of iterative reconstruction technique. COMPARISON:  10/12/2019 FINDINGS: Brain: No evidence of acute infarction, hemorrhage, hydrocephalus, extra-axial collection or mass lesion/mass effect. Chronic atrophic and ischemic changes are noted. Vascular: No hyperdense vessel or unexpected calcification. Skull: Normal. Negative for fracture or focal lesion. Sinuses/Orbits: Paranasal sinuses are within normal limits. 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 Other: None IMPRESSION: Chronic atrophic and ischemic changes similar to that seen on the prior exam. No acute infarct is noted. Electronically Signed   By: Oneil Devonshire M.D.   On: 09/06/2024 20:25   DG Chest Port 1 View Result Date: 09/06/2024 CLINICAL DATA:  Questionable sepsis - evaluate for abnormality EXAM: PORTABLE CHEST - 1 VIEW COMPARISON:  07/16/2022 FINDINGS: No focal airspace consolidation, pleural effusion, or pneumothorax. No cardiomegaly. Tortuous aorta with aortic atherosclerosis. No acute fracture or destructive lesions. Multilevel thoracic osteophytosis. Changes suggestive of chronic rotator cuff tears bilaterally. IMPRESSION: No acute cardiopulmonary abnormality. Electronically Signed   By: Rogelia Myers M.D.   On: 09/06/2024 18:55    Microbiology: Results for orders placed or performed during the  hospital encounter of 09/06/24  Blood Culture (routine x 2)     Status: None   Collection Time: 09/06/24  6:27 PM   Specimen: BLOOD RIGHT FOREARM  Result Value Ref Range Status   Specimen Description   Final    BLOOD RIGHT FOREARM Performed at [facility], [address] [place], [place] [address]    Special Requests    Final    BOTTLES DRAWN AEROBIC AND ANAEROBIC Blood Culture adequate volume Performed at [facility], [address] [place], [place] [address]    Culture   Final    NO GROWTH 5 DAYS Performed at [facility], [address], [place], [place] [address]    Report Status 09/11/2024 FINAL  Final  Resp panel by RT-PCR (RSV, Flu A&B, Covid) Anterior Nasal Swab     Status: None   Collection Time: 09/06/24  7:30 PM   Specimen: Anterior Nasal Swab  Result Value Ref Range Status   SARS Coronavirus 2 by RT PCR NEGATIVE NEGATIVE Final    Comment: (NOTE) SARS-CoV-2 target nucleic acids are NOT DETECTED.  The SARS-CoV-2 RNA is generally detectable in upper respiratory specimens during the acute phase of infection. The lowest concentration of SARS-CoV-2 viral copies this assay can detect is 138 copies/mL. A negative result does not preclude SARS-Cov-2 infection and should not be used as the sole basis for treatment or other patient management decisions. A negative result may occur with  improper specimen collection/handling, submission of specimen other than nasopharyngeal swab, presence of viral mutation(s) within the areas targeted by this assay, and inadequate number of viral copies(<138 copies/mL). A negative result must be combined with clinical observations, patient history, and epidemiological information. The expected result is Negative.  Fact Sheet for Patients:  bloggercourse.com  Fact Sheet for Healthcare Providers:  seriousbroker.it  This test is no t yet approved or cleared by the [place]  FDA and  has been authorized for detection and/or diagnosis of SARS-CoV-2 by FDA under an Emergency Use Authorization (EUA). This EUA will remain  in effect (meaning this test can be used) for the duration of the COVID-19 declaration under Section 564(b)(1) of the Act, 21 U.S.C.section 360bbb-3(b)(1), unless the  authorization is terminated  or revoked sooner.       Influenza A by PCR NEGATIVE NEGATIVE Final   Influenza B by PCR NEGATIVE NEGATIVE Final    Comment: (NOTE) The Xpert Xpress SARS-CoV-2/FLU/RSV plus assay is intended as an aid in the diagnosis of influenza from Nasopharyngeal swab specimens and should not be used as a sole basis for treatment. Nasal washings and aspirates are unacceptable for Xpert Xpress SARS-CoV-2/FLU/RSV testing.  Fact Sheet for Patients: bloggercourse.com  Fact Sheet for Healthcare Providers: seriousbroker.it  This test is not yet approved or cleared by the [place]  FDA and has been authorized for detection and/or diagnosis of SARS-CoV-2 by FDA under an Emergency Use Authorization (EUA). This EUA will remain in effect (meaning this test can be used) for the duration of the COVID-19 declaration under Section 564(b)(1) of the Act, 21 U.S.C. section 360bbb-3(b)(1), unless the authorization is terminated or revoked.     Resp Syncytial Virus by PCR NEGATIVE NEGATIVE Final    Comment: (NOTE) Fact Sheet for Patients: bloggercourse.com  Fact Sheet for Healthcare Providers: seriousbroker.it  This test is not yet approved or cleared by the [place]  FDA and has been authorized for detection and/or diagnosis of SARS-CoV-2 by FDA under an Emergency Use  Authorization (EUA). This EUA will remain in effect (meaning this test can be used) for the duration of the COVID-19 declaration under Section 564(b)(1) of the Act, 21 U.S.C. section 360bbb-3(b)(1), unless the authorization is terminated or revoked.  Performed at [facility], [address] [place], [place] [address]   Blood Culture (routine x 2)     Status: None (Preliminary result)   Collection Time: 09/07/24  2:36 PM   Specimen: BLOOD LEFT ARM  Result Value Ref Range Status   Specimen  Description BLOOD LEFT ARM  Final   Special Requests   Final    BOTTLES DRAWN AEROBIC AND ANAEROBIC Blood Culture results may not be optimal due to an inadequate volume of blood received in culture bottles   Culture   Final    NO GROWTH 4 DAYS Performed at [facility], [address], [place], [place] [address]    Report Status PENDING  Incomplete  Urine Culture (for pregnant, neutropenic or urologic patients or patients with an indwelling urinary catheter)     Status: None   Collection Time: 09/08/24  4:21 PM   Specimen: Urine, Clean Catch  Result Value Ref Range Status   Specimen Description URINE, CLEAN CATCH  Final   Special Requests NONE  Final   Culture   Final    NO GROWTH Performed at [facility], [address], [place], [place] [address]    Report Status 09/09/2024 FINAL  Final    Labs: CBC: Recent Labs  Lab 09/06/24 1702 09/08/24 0512 09/10/24 0412  WBC 6.9 5.5 5.2  HGB 14.7 13.0 13.7  HCT 45.5 40.5 42.0  MCV 89.7 89.8 88.2  PLT 292 220 234   Basic Metabolic Panel: Recent Labs  Lab 09/06/24 1702 09/08/24 0512 09/08/24 1738 09/10/24 0412 09/11/24 0606  NA 140 138  --  138 135  K 4.4 4.2  --  3.9 4.0  CL 103 109  --  103 101  CO2 25 21*  --  22 23  GLUCOSE 109* 108*  --  95 121*  BUN 24* 21  --  16 14  CREATININE 1.27* 1.25*  --  1.12 1.33*  CALCIUM 9.4 8.5*  --  8.8* 8.9  MG  --   --  2.1 2.0  --    Liver Function Tests: Recent Labs  Lab 09/06/24 1702 09/08/24 0512  AST 23 17  ALT 7 8  ALKPHOS 65 44  BILITOT 0.4 0.4  PROT 8.2* 6.3*  ALBUMIN 4.3 2.8*   CBG: Recent Labs  Lab 09/10/24 0804 09/10/24 1204 09/10/24 2135 09/11/24 0841 09/11/24 1235  GLUCAP 73 72 109* 106* 90    Discharge time spent: greater than 30 minutes.  Signed: [name], MD [facility] 09/11/2024

## 2024-09-11 NOTE — Plan of Care (Signed)
  Problem: Education: Goal: Knowledge of General Education information will improve Description: Including pain rating scale, medication(s)/side effects and non-pharmacologic comfort measures Outcome: Adequate for Discharge   Problem: Health Behavior/Discharge Planning: Goal: Ability to manage health-related needs will improve Outcome: Adequate for Discharge   Problem: Clinical Measurements: Goal: Ability to maintain clinical measurements within normal limits will improve Outcome: Adequate for Discharge Goal: Will remain free from infection Outcome: Adequate for Discharge Goal: Diagnostic test results will improve Outcome: Adequate for Discharge Goal: Respiratory complications will improve Outcome: Adequate for Discharge Goal: Cardiovascular complication will be avoided Outcome: Adequate for Discharge   Problem: Activity: Goal: Risk for activity intolerance will decrease Outcome: Adequate for Discharge   Problem: Nutrition: Goal: Adequate nutrition will be maintained Outcome: Adequate for Discharge   Problem: Coping: Goal: Level of anxiety will decrease Outcome: Adequate for Discharge   Problem: Elimination: Goal: Will not experience complications related to bowel motility Outcome: Adequate for Discharge Goal: Will not experience complications related to urinary retention Outcome: Adequate for Discharge   Problem: Pain Managment: Goal: General experience of comfort will improve and/or be controlled Outcome: Adequate for Discharge   Problem: Safety: Goal: Ability to remain free from injury will improve Outcome: Adequate for Discharge   Problem: Skin Integrity: Goal: Risk for impaired skin integrity will decrease Outcome: Adequate for Discharge   Problem: Education: Goal: Ability to describe self-care measures that may prevent or decrease complications (Diabetes Survival Skills Education) will improve Outcome: Adequate for Discharge Goal: Individualized Educational  Video(s) Outcome: Adequate for Discharge   Problem: Coping: Goal: Ability to adjust to condition or change in health will improve Outcome: Adequate for Discharge   Problem: Fluid Volume: Goal: Ability to maintain a balanced intake and output will improve Outcome: Adequate for Discharge   Problem: Health Behavior/Discharge Planning: Goal: Ability to identify and utilize available resources and services will improve Outcome: Adequate for Discharge Goal: Ability to manage health-related needs will improve Outcome: Adequate for Discharge   Problem: Metabolic: Goal: Ability to maintain appropriate glucose levels will improve Outcome: Adequate for Discharge   Problem: Nutritional: Goal: Maintenance of adequate nutrition will improve Outcome: Adequate for Discharge Goal: Progress toward achieving an optimal weight will improve Outcome: Adequate for Discharge   Problem: Skin Integrity: Goal: Risk for impaired skin integrity will decrease Outcome: Adequate for Discharge   Problem: Tissue Perfusion: Goal: Adequacy of tissue perfusion will improve Outcome: Adequate for Discharge   Problem: Education: Goal: Knowledge of cardiac device and self-care will improve Outcome: Adequate for Discharge Goal: Ability to safely manage health related needs after discharge will improve Outcome: Adequate for Discharge Goal: Individualized Educational Video(s) Outcome: Adequate for Discharge   Problem: Cardiac: Goal: Ability to achieve and maintain adequate cardiopulmonary perfusion will improve Outcome: Adequate for Discharge

## 2024-09-11 NOTE — TOC CM/SW Note (Addendum)
 Referral received to assist pt with HHPT/aide. Met with pt to discuss referral but pt is asleep. Contacted pt's wife. She agrees with [facility] and she reports that she doesn't have a preference for a HH agency. Discussed HH agencies that are in network with pt's insurance. She agreed to use [facility]. Contacted Corey at [facility] and he accepted the referral.

## 2024-09-11 NOTE — Plan of Care (Signed)
  Problem: Education: Goal: Knowledge of General Education information will improve Description: Including pain rating scale, medication(s)/side effects and non-pharmacologic comfort measures Outcome: Progressing   Problem: Health Behavior/Discharge Planning: Goal: Ability to manage health-related needs will improve Outcome: Progressing   Problem: Clinical Measurements: Goal: Ability to maintain clinical measurements within normal limits will improve Outcome: Progressing Goal: Will remain free from infection Outcome: Progressing Goal: Diagnostic test results will improve Outcome: Progressing Goal: Respiratory complications will improve Outcome: Progressing Goal: Cardiovascular complication will be avoided Outcome: Progressing   Problem: Activity: Goal: Risk for activity intolerance will decrease Outcome: Progressing   Problem: Nutrition: Goal: Adequate nutrition will be maintained Outcome: Progressing   Problem: Coping: Goal: Level of anxiety will decrease Outcome: Progressing   Problem: Elimination: Goal: Will not experience complications related to bowel motility Outcome: Progressing Goal: Will not experience complications related to urinary retention Outcome: Progressing   Problem: Pain Managment: Goal: General experience of comfort will improve and/or be controlled Outcome: Progressing   Problem: Safety: Goal: Ability to remain free from injury will improve Outcome: Progressing   Problem: Skin Integrity: Goal: Risk for impaired skin integrity will decrease Outcome: Progressing   Problem: Education: Goal: Ability to describe self-care measures that may prevent or decrease complications (Diabetes Survival Skills Education) will improve Outcome: Progressing Goal: Individualized Educational Video(s) Outcome: Progressing   Problem: Coping: Goal: Ability to adjust to condition or change in health will improve Outcome: Progressing   Problem: Fluid  Volume: Goal: Ability to maintain a balanced intake and output will improve Outcome: Progressing   Problem: Health Behavior/Discharge Planning: Goal: Ability to identify and utilize available resources and services will improve Outcome: Progressing Goal: Ability to manage health-related needs will improve Outcome: Progressing   Problem: Metabolic: Goal: Ability to maintain appropriate glucose levels will improve Outcome: Progressing   Problem: Nutritional: Goal: Maintenance of adequate nutrition will improve Outcome: Progressing Goal: Progress toward achieving an optimal weight will improve Outcome: Progressing   Problem: Skin Integrity: Goal: Risk for impaired skin integrity will decrease Outcome: Progressing   Problem: Tissue Perfusion: Goal: Adequacy of tissue perfusion will improve Outcome: Progressing   Problem: Education: Goal: Knowledge of cardiac device and self-care will improve Outcome: Progressing Goal: Ability to safely manage health related needs after discharge will improve Outcome: Progressing Goal: Individualized Educational Video(s) Outcome: Progressing   Problem: Cardiac: Goal: Ability to achieve and maintain adequate cardiopulmonary perfusion will improve Outcome: Progressing

## 2024-09-11 NOTE — Progress Notes (Addendum)
  Progress Note  Patient Name: [name] Date of Encounter: 09/11/2024 [name] Cardiologist: None   Interval Summary   Leadless pacemaker implant yesterday. Sleeping comfortably this morning. No new or acute complaints. Family at bedside.  Vital Signs Vitals:   09/11/24 0455 09/11/24 0500 09/11/24 0710 09/11/24 0810  BP:   127/73 124/61  Pulse:   73 66  Resp: 14   16  Temp: (!) 100.8 F (38.2 C)   98.8 F (37.1 C)  TempSrc: Axillary     SpO2:   98% 100%  Weight:  69.3 kg    Height:        Intake/Output Summary (Last 24 hours) at 09/11/2024 0909 Last data filed at 09/10/2024 1748 Gross per 24 hour  Intake 200 ml  Output 300 ml  Net -100 ml      09/11/2024    5:00 AM 09/10/2024    5:00 AM 09/09/2024    3:12 AM  Last 3 Weights  Weight (lbs) 152 lb 12.5 oz 155 lb 13.8 oz 157 lb 10.1 oz  Weight (kg) 69.3 kg 70.7 kg 71.5 kg      Telemetry/ECG  VP, appears to be tracking atrial activity majority of time - Personally Reviewed  Physical Exam  General: Well developed, in no acute distress.  Neck: No JVD.  Cardiac: Normal rate, regular rhythm.  Resp: Normal work of breathing.  Ext: No edema. Right groin site soft without bleeding or hematoma. Neuro: No gross focal deficits.  Psych: Normal affect.   Assessment & Plan  3M with paroxysmal AF, HLD, DM 2, prior DVT and advanced dementia who is evaluated for high degree AV block.   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 -Device interrogation performed with appropriate function and stable parameters. R waves 13mV, impedance 640 ohms, threshold 0.68V @0 .24ms. -Follow up in device clinic in ~14 days.  -No heavy lifting for 1 week.   EP will sign off.   Signed, Fonda Kitty, MD

## 2024-09-11 NOTE — Progress Notes (Signed)
 DISCHARGE NOTE HOME CAMAR GUYTON to be discharged Home per MD order. Discussed prescriptions and follow up appointments with the patient. Prescriptions given to patient; medication list explained in detail. Patient verbalized understanding.  Skin clean, dry and intact without evidence of skin break down, no evidence of skin tears noted. IV catheter discontinued intact. Site without signs and symptoms of complications. Dressing and pressure applied. Pt denies pain at the site currently. No complaints noted.  Patient free of lines, drains, and wounds.   An After Visit Summary (AVS) was printed and given to the patient. Patient escorted via wheelchair, and discharged home via private auto.  Peyton SHAUNNA Pepper, RN

## 2024-09-11 NOTE — Evaluation (Signed)
 Physical Therapy Evaluation Patient Details Name: [name] MRN: [id number] DOB: 03-Dec-1940 Today's Date: 09/11/2024  History of Present Illness  83 yo male presents to [facility] on 10/20 with AMS, UTI, bradycardic events, fall 10/18. S/p placement of leadless PPM 10/24. PMH includes CHB, PAF, DMII, GERD, joint sepsis, DVT, ABLA, HLD, L eye blindness, dementia.  Clinical Impression  Pt presents with generalized weakness, impaired balance, decreased knowledge and application of leadless pacemaker precautions, impaired activity tolerance. Pt to benefit from acute PT to address deficits. Pt ambulated short room distance, overall requiring min-mod physical assist for mobility. Pt with improving assist levels with repeated sit<>stands, per pt's granddaughter pt has not mobilized in 3 days. Pt's granddaughter, who serves as caregiver, states she feels comfortable caring for pt at d/c with current assist levels. PT reviewed leadless pacemaker precautions with pt and family, family expresses understanding. PT recommends [facility] services post-acutely. PT to progress mobility as tolerated, and will continue to follow acutely.      HR 70s throughout session    If plan is discharge home, recommend the following: A lot of help with walking and/or transfers;A lot of help with bathing/dressing/bathroom;Assistance with cooking/housework;Direct supervision/assist for medications management;Assist for transportation;Help with stairs or ramp for entrance;Supervision due to cognitive status   Can travel by private vehicle        Equipment Recommendations None recommended by PT  Recommendations for Other Services       Functional Status Assessment Patient has had a recent decline in their functional status and demonstrates the ability to make significant improvements in function in a reasonable and predictable amount of time.     Precautions / Restrictions Precautions Precautions: Fall;Other (comment) Recall of  Precautions/Restrictions: Impaired Precaution/Restrictions Comments: reviewed leadless pacemaker precautions (no heavy lifting >10 lbs and no strenuous activity x2 weeks, no hip flexion R groin >90 deg, step-to pattern on stair navigation, gait as needed but not excessive, no significant torsion RLE given groin site) with pt and granddaughter Restrictions Weight Bearing Restrictions Per Provider Order: No      Mobility  Bed Mobility Overal bed mobility: Needs Assistance Bed Mobility: Supine to Sit, Sit to Supine     Supine to sit: Mod assist Sit to supine: Mod assist   General bed mobility comments: assist for trunk and LE management    Transfers Overall transfer level: Needs assistance Equipment used: Rolling walker (2 wheels), 2 person hand held assist Transfers: Sit to/from Stand Sit to Stand: Min assist           General transfer comment: assist for rise and steady, cues for hand placement on RW then transitioned on 2 HHA on repeated stands    Ambulation/Gait Ambulation/Gait assistance: Min assist Gait Distance (Feet): 20 Feet Assistive device: Rolling walker (2 wheels) Gait Pattern/deviations: Step-through pattern, Decreased stride length, Trunk flexed, Leaning posteriorly Gait velocity: decr     General Gait Details: assist to steady, cues for upright posture and proximity to [employer] Mobility     Tilt Bed    Modified Rankin (Stroke Patients Only)       Balance Overall balance assessment: Needs assistance Sitting-balance support: No upper extremity supported, Feet supported Sitting balance-Leahy Scale: Fair Sitting balance - Comments: fair to poor, periods of posterior bias Postural control: Posterior lean Standing balance support: During functional activity, Bilateral upper extremity supported Standing balance-Leahy Scale: Poor Standing balance comment: reliant on either RW or  PT steadying support                              Pertinent Vitals/Pain Pain Assessment Pain Assessment: Faces Faces Pain Scale: Hurts little more Pain Location: generalized with activity or stimulus (cold gait belt, clean up after BM) Pain Descriptors / Indicators: Guarding, Grimacing Pain Intervention(s): Monitored during session, Limited activity within patient's tolerance    Home Living Family/patient expects to be discharged to:: Private residence Living Arrangements: Spouse/significant other;Other (Comment) (granddaughter) Available Help at Discharge: Available 24 hours/day;Family Type of Home: House Home Access: Stairs to enter   Entrance Stairs-Number of Steps: 1   Home Layout: One level Home Equipment: Cane - single point;Hand held Programmer, Systems (2 wheels);Wheelchair - Manufacturing Systems Engineer      Prior Function Prior Level of Function : Needs assist             Mobility Comments: per pt's granddaughter pt sometimes uses SPC but does not need it ADLs Comments: per pt's granddaughter pt assisting with dressing, in and out of tub shower, bathing, meals     Extremity/Trunk Assessment   Upper Extremity Assessment Upper Extremity Assessment: Defer to OT evaluation    Lower Extremity Assessment Lower Extremity Assessment: Generalized weakness    Cervical / Trunk Assessment Cervical / Trunk Assessment: Normal  Communication   Communication Communication: Impaired Factors Affecting Communication: Hearing impaired    Cognition Arousal: Alert Behavior During Therapy: Restless   PT - Cognitive impairments: History of cognitive impairments                       PT - Cognition Comments: history of dementia, not oriented to self, location, situation, or time. Pt's granddaughter at bedside and states this is baseline, has waxing and waning orientation any given day.         Cueing Cueing Techniques: Verbal cues, Gestural cues     General Comments General comments (skin  integrity, edema, etc.): multiple bouts of stooling, PT and granddaughter assist for pericare    Exercises     Assessment/Plan    PT Assessment Patient needs continued PT services  PT Problem List Decreased strength;Decreased mobility;Decreased activity tolerance;Decreased balance;Decreased knowledge of use of DME;Pain;Cardiopulmonary status limiting activity;Decreased safety awareness       PT Treatment Interventions DME instruction;Therapeutic activities;Gait training;Therapeutic exercise;Patient/family education;Balance training;Stair training;Functional mobility training;Neuromuscular re-education    PT Goals (Current goals can be found in the Care Plan section)  Acute Rehab PT Goals PT Goal Formulation: With patient Time For Goal Achievement: 09/25/24 Potential to Achieve Goals: Good    Frequency Min 2X/week     Co-evaluation               AM-PAC PT 6 Clicks Mobility  Outcome Measure Help needed turning from your back to your side while in a flat bed without using bedrails?: A Little Help needed moving from lying on your back to sitting on the side of a flat bed without using bedrails?: A Little Help needed moving to and from a bed to a chair (including a wheelchair)?: A Lot Help needed standing up from a chair using your arms (e.g., wheelchair or bedside chair)?: A Lot Help needed to walk in hospital room?: A Lot Help needed climbing 3-5 steps with a railing? : Total 6 Click Score: 13    End of Session Equipment Utilized During Treatment: Gait belt Activity Tolerance: Patient limited  by fatigue Patient left: with call bell/phone within reach;in bed;with bed alarm set Nurse Communication: Mobility status PT Visit Diagnosis: Other abnormalities of gait and mobility (R26.89);Muscle weakness (generalized) (M62.81)    Time: 8586-8556 PT Time Calculation (min) (ACUTE ONLY): 30 min   Charges:   PT Evaluation $PT Eval Low Complexity: 1 Low PT  Treatments $Therapeutic Activity: 8-22 mins PT General Charges $$ ACUTE PT VISIT: 1 Visit         Johana RAMAN, PT DPT Acute Rehabilitation Services Secure Chat Preferred  Office [phone]   [name] 09/11/2024, 3:29 PM

## 2024-09-12 LAB — CULTURE, BLOOD (ROUTINE X 2): Culture: NO GROWTH

## 2024-09-13 ENCOUNTER — Encounter: Payer: Self-pay | Admitting: Emergency Medicine

## 2024-09-13 LAB — GLUCOSE, CAPILLARY: Glucose-Capillary: 101 mg/dL — ABNORMAL HIGH (ref 70–99)

## 2024-09-13 MED FILL — Sodium Bicarbonate IV Soln 8.4%: INTRAVENOUS | Qty: 50 | Status: AC

## 2024-09-14 ENCOUNTER — Telehealth: Payer: Self-pay | Admitting: Cardiology

## 2024-09-14 DIAGNOSIS — Z87891 Personal history of nicotine dependence: Secondary | ICD-10-CM | POA: Diagnosis not present

## 2024-09-14 DIAGNOSIS — Z87440 Personal history of urinary (tract) infections: Secondary | ICD-10-CM | POA: Diagnosis not present

## 2024-09-14 DIAGNOSIS — E782 Mixed hyperlipidemia: Secondary | ICD-10-CM | POA: Diagnosis not present

## 2024-09-14 DIAGNOSIS — Z7982 Long term (current) use of aspirin: Secondary | ICD-10-CM | POA: Diagnosis not present

## 2024-09-14 DIAGNOSIS — Z86718 Personal history of other venous thrombosis and embolism: Secondary | ICD-10-CM | POA: Diagnosis not present

## 2024-09-14 DIAGNOSIS — Z950 Presence of cardiac pacemaker: Secondary | ICD-10-CM | POA: Diagnosis not present

## 2024-09-14 DIAGNOSIS — I480 Paroxysmal atrial fibrillation: Secondary | ICD-10-CM | POA: Diagnosis not present

## 2024-09-14 DIAGNOSIS — F909 Attention-deficit hyperactivity disorder, unspecified type: Secondary | ICD-10-CM | POA: Diagnosis not present

## 2024-09-14 DIAGNOSIS — E119 Type 2 diabetes mellitus without complications: Secondary | ICD-10-CM | POA: Diagnosis not present

## 2024-09-14 DIAGNOSIS — H5462 Unqualified visual loss, left eye, normal vision right eye: Secondary | ICD-10-CM | POA: Diagnosis not present

## 2024-09-14 DIAGNOSIS — I10 Essential (primary) hypertension: Secondary | ICD-10-CM | POA: Diagnosis not present

## 2024-09-14 DIAGNOSIS — I442 Atrioventricular block, complete: Secondary | ICD-10-CM | POA: Diagnosis not present

## 2024-09-14 DIAGNOSIS — Z48812 Encounter for surgical aftercare following surgery on the circulatory system: Secondary | ICD-10-CM | POA: Diagnosis not present

## 2024-09-14 DIAGNOSIS — D649 Anemia, unspecified: Secondary | ICD-10-CM | POA: Diagnosis not present

## 2024-09-14 DIAGNOSIS — Z96642 Presence of left artificial hip joint: Secondary | ICD-10-CM | POA: Diagnosis not present

## 2024-09-14 DIAGNOSIS — F0390 Unspecified dementia without behavioral disturbance: Secondary | ICD-10-CM | POA: Diagnosis not present

## 2024-09-14 DIAGNOSIS — Z9181 History of falling: Secondary | ICD-10-CM | POA: Diagnosis not present

## 2024-09-14 NOTE — Telephone Encounter (Signed)
 Pts wife is having trouble setting up the medtronic monitor. Please advise.

## 2024-09-14 NOTE — Telephone Encounter (Signed)
 Returned call to Pt.  Spoke with wife.  Added monitor information for Pt into [facility].  Advised monitor should work automatically.  Will reevaluate tomorrow to ensure monitor is working correctly and call back.

## 2024-09-17 DIAGNOSIS — I480 Paroxysmal atrial fibrillation: Secondary | ICD-10-CM | POA: Diagnosis not present

## 2024-09-17 DIAGNOSIS — Z950 Presence of cardiac pacemaker: Secondary | ICD-10-CM | POA: Diagnosis not present

## 2024-09-17 DIAGNOSIS — E119 Type 2 diabetes mellitus without complications: Secondary | ICD-10-CM | POA: Diagnosis not present

## 2024-09-17 DIAGNOSIS — R4182 Altered mental status, unspecified: Secondary | ICD-10-CM | POA: Diagnosis not present

## 2024-09-17 DIAGNOSIS — N390 Urinary tract infection, site not specified: Secondary | ICD-10-CM | POA: Diagnosis not present

## 2024-09-17 DIAGNOSIS — G309 Alzheimer's disease, unspecified: Secondary | ICD-10-CM | POA: Diagnosis not present

## 2024-09-17 DIAGNOSIS — G4701 Insomnia due to medical condition: Secondary | ICD-10-CM | POA: Diagnosis not present

## 2024-09-20 ENCOUNTER — Telehealth: Payer: Self-pay

## 2024-09-20 DIAGNOSIS — F0280 Dementia in other diseases classified elsewhere without behavioral disturbance: Secondary | ICD-10-CM

## 2024-09-22 ENCOUNTER — Ambulatory Visit: Admitting: Student

## 2024-09-22 ENCOUNTER — Ambulatory Visit: Attending: Cardiology

## 2024-09-22 DIAGNOSIS — I442 Atrioventricular block, complete: Secondary | ICD-10-CM

## 2024-09-22 LAB — CUP PACEART INCLINIC DEVICE CHECK
Date Time Interrogation Session: 20251105154610
Implantable Pulse Generator Implant Date: 20251024

## 2024-09-22 NOTE — Telephone Encounter (Signed)
 Pt was seen today and Donley has let wife know how to do his transmission and they will try when they get home

## 2024-09-22 NOTE — Progress Notes (Signed)
 Device interrogation performed by industry. Leadless pacemaker check in clinic. Please see scanned/attached report.  Presenting rhythm: AM/VP 90. Longevity 9.5 years. VS 6.6%, AM-VP 81.2%,  VP 11.6%.   Implanted for CHB.  Remote transmission has not been received. Patient family states they will send transmission after arriving home. Enrolled in remote follow-up.

## 2024-09-22 NOTE — Telephone Encounter (Signed)
 Spoke with wife letting her know that we still did not receive the transmission. I have tried to walk her through how to send it and she is having issues and needs help. Wife will call back this afternoon when her granddaughter is there so she can help

## 2024-09-22 NOTE — Patient Instructions (Signed)
   After Your Pacemaker   . Call the device clinic at [phone] if you experience these symptoms or fever/chills.   You may drive, unless driving has been restricted by your healthcare providers.  Remote monitoring is used to monitor your pacemaker from home. This monitoring is scheduled every 91 days by our office. It allows us  to keep an eye on the functioning of your device to ensure it is working properly. You will routinely see your Electrophysiologist annually (more often if necessary).

## 2024-09-24 NOTE — Telephone Encounter (Signed)
Transmission received.

## 2024-09-26 NOTE — Progress Notes (Incomplete)
 Cardiology Office Note:   Date:  09/26/2024  ID:  KYZER BLOWE, DOB 1941-02-14, MRN [id number] PCP: Cleotilde Planas, MD  [facility] Providers Cardiologist:  None Electrophysiologist:  OLE ONEIDA HOLTS, MD { Chief Complaint: No chief complaint on file.     History of Present Illness:   [name] is a 83 y.o. male with a PMH of High Degree AVB s/p MDT Micra (09/10/24), PAF (not on OAC due to epistaxis), HTN, HLD, DM2, prior DVT and dementia who presents for follow up.   I met Mr. Robin when he was hospitalized last month for UTI.  During the hospitalization he was incidentally noted to be bradycardic to the 30s.  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  Initially the family refused pacemaker placement, but later decided that they wanted to proceed.  He underwent MDT Micra placement by Dr. Almetta without complication.  He presents today for follow-up.     Past Medical History:  Diagnosis Date   Arthritis    Attention deficit disorder    Blind left eye    Diabetes mellitus without complication (HCC)    Hyperlipidemia    Hypertension      Studies Reviewed:    EKG: ***       Cardiac Studies & Procedures   ______________________________________________________________________________________________     ECHOCARDIOGRAM  ECHOCARDIOGRAM COMPLETE 07/02/2022  Narrative ECHOCARDIOGRAM REPORT    Patient Name:   [name] Date of Exam: 07/02/2022 Medical Rec #:  [id number]        Height:       68.0 in Accession #:    [id number]       Weight:       153.6 lb Date of Birth:  05-26-1941         BSA:          1.827 m Patient Age:    83 years         BP:           110/62 mmHg Patient Gender: M                HR:           74 bpm. Exam Location:  [address]  Procedure: 2D Echo, Cardiac Doppler and Color  Doppler  Indications:    I48.0 Paroxysmal atrial fibrillation  History:        Patient has prior history of Echocardiogram examinations, most recent 11/03/2019. Pre-op evaluation, Arrythmias:Paroxysmal atrial fibrillation; Risk Factors:Hypertension and Dyslipidemia.  Sonographer:    Elsie Bohr RDCS Referring Phys: [id number] OLE ONEIDA LAMBERT  IMPRESSIONS   1. Left ventricular ejection fraction, by estimation, is 60 to 65%. The left ventricle has normal function. The left ventricle has no regional wall motion abnormalities. Left ventricular diastolic parameters were normal. 2. Right ventricular systolic function is normal. The right ventricular size is normal. 3. Left atrial size was moderately dilated. 4. The mitral valve is abnormal. Trivial mitral valve regurgitation. No evidence of mitral stenosis. 5. The aortic valve is tricuspid. There is mild calcification of the aortic valve. There is mild thickening of the aortic valve. Aortic valve regurgitation is not visualized. Aortic valve sclerosis is present, with no evidence of aortic valve stenosis. 6. The inferior vena cava is normal in size with greater than 50% respiratory variability, suggesting right atrial pressure of 3 mmHg.  FINDINGS Left Ventricle: Left ventricular ejection  fraction, by estimation, is 60 to 65%. The left ventricle has normal function. The left ventricle has no regional wall motion abnormalities. The left ventricular internal cavity size was normal in size. There is no left ventricular hypertrophy. Left ventricular diastolic parameters were normal.  Right Ventricle: The right ventricular size is normal. No increase in right ventricular wall thickness. Right ventricular systolic function is normal.  Left Atrium: Left atrial size was moderately dilated.  Right Atrium: Right atrial size was normal in size.  Pericardium: There is no evidence of pericardial effusion.  Mitral Valve: The mitral valve is abnormal.  There is mild thickening of the mitral valve leaflet(s). There is mild calcification of the mitral valve leaflet(s). Mild mitral annular calcification. Trivial mitral valve regurgitation. No evidence of mitral valve stenosis.  Tricuspid Valve: The tricuspid valve is normal in structure. Tricuspid valve regurgitation is not demonstrated. No evidence of tricuspid stenosis.  Aortic Valve: The aortic valve is tricuspid. There is mild calcification of the aortic valve. There is mild thickening of the aortic valve. Aortic valve regurgitation is not visualized. Aortic regurgitation PHT measures 427 msec. Aortic valve sclerosis is present, with no evidence of aortic valve stenosis.  Pulmonic Valve: The pulmonic valve was normal in structure. Pulmonic valve regurgitation is not visualized. No evidence of pulmonic stenosis.  Aorta: The aortic root is normal in size and structure.  Venous: The inferior vena cava is normal in size with greater than 50% respiratory variability, suggesting right atrial pressure of 3 mmHg.  IAS/Shunts: The interatrial septum was not well visualized.   LEFT VENTRICLE PLAX 2D LVIDd:         3.60 cm   Diastology LVIDs:         2.40 cm   LV e' medial:    6.20 cm/s LV PW:         1.00 cm   LV E/e' medial:  14.9 LV IVS:        1.10 cm   LV e' lateral:   9.68 cm/s LVOT diam:     2.00 cm   LV E/e' lateral: 9.5 LV SV:         76 LV SV Index:   41 LVOT Area:     3.14 cm   RIGHT VENTRICLE             IVC RV S prime:     14.00 cm/s  IVC diam: 0.70 cm TAPSE (M-mode): 1.7 cm RVSP:           31.1 mmHg  LEFT ATRIUM             Index        RIGHT ATRIUM           Index LA diam:        4.60 cm 2.52 cm/m   RA Pressure: 3.00 mmHg LA Vol (A2C):   39.7 ml 21.70 ml/m  RA Area:     14.90 cm LA Vol (A4C):   42.8 ml 23.43 ml/m  RA Volume:   41.80 ml  22.88 ml/m LA Biplane Vol: 43.0 ml 23.54 ml/m AORTIC VALVE LVOT Vmax:   120.00 cm/s LVOT Vmean:  77.100 cm/s LVOT VTI:     0.241 m AI PHT:      427 msec  AORTA Ao Root diam: 3.70 cm Ao Asc diam:  3.30 cm  MITRAL VALVE                TRICUSPID VALVE MV Area (PHT):  2.71 cm     TR Peak grad:   28.1 mmHg MV Decel Time: 280 msec     TR Vmax:        265.00 cm/s MV E velocity: 92.20 cm/s   Estimated RAP:  3.00 mmHg MV A velocity: 114.00 cm/s  RVSP:           31.1 mmHg MV E/A ratio:  0.81 SHUNTS Systemic VTI:  0.24 m Systemic Diam: 2.00 cm  Maude Emmer MD Electronically signed by Maude Emmer MD Signature Date/Time: 07/02/2022/10:14:39 AM    Final    MONITORS  CARDIAC EVENT MONITOR 07/02/2022  Narrative   Basic rhythm is sinus   Nocturnal bradycardia with second-degree AV block intermittently.   No reported symptoms.       ______________________________________________________________________________________________      Risk Assessment/Calculations:   {Does this patient have ATRIAL FIBRILLATION?:[phone]} No BP recorded.  {Refresh Note OR Click here to enter BP  :1}***        Physical Exam:     VS:  There were no vitals taken for this visit. ***    Wt Readings from Last 3 Encounters:  09/11/24 152 lb 12.5 oz (69.3 kg)  12/02/22 158 lb (71.7 kg)  10/28/22 157 lb 3.2 oz (71.3 kg)     GEN: Well nourished, well developed, in no acute distress NECK: No JVD; No carotid bruits CARDIAC: ***RRR, no murmurs, rubs, gallops RESPIRATORY:  Clear to auscultation without rales, wheezing or rhonchi  ABDOMEN: Soft, non-tender, non-distended, normal bowel sounds EXTREMITIES:  Warm and well perfused, no edema; No deformity, 2+ radial pulses PSYCH: Normal mood and affect   Assessment & Plan Heart block AV complete (HCC)  PAF (paroxysmal atrial fibrillation) (HCC)  Essential hypertension  Mixed hyperlipidemia       {Are you ordering a CV Procedure (e.g. stress test, cath, DCCV, TEE, etc)?   Press F2        :[id number]}   This note was written with the assistance of a dictation  microphone or AI dictation software. Please excuse any typos or grammatical errors.   Signed, Georganna Archer, MD 09/26/2024 8:25 PM    [facility]

## 2024-09-27 ENCOUNTER — Ambulatory Visit
Attending: Student in an Organized Health Care Education/Training Program | Admitting: Student in an Organized Health Care Education/Training Program

## 2024-09-27 ENCOUNTER — Encounter: Payer: Self-pay | Admitting: Student in an Organized Health Care Education/Training Program

## 2024-09-27 VITALS — BP 116/60 | HR 62 | Ht 68.0 in | Wt 150.0 lb

## 2024-09-27 DIAGNOSIS — R443 Hallucinations, unspecified: Secondary | ICD-10-CM | POA: Diagnosis not present

## 2024-09-27 DIAGNOSIS — E782 Mixed hyperlipidemia: Secondary | ICD-10-CM | POA: Diagnosis not present

## 2024-09-27 DIAGNOSIS — I442 Atrioventricular block, complete: Secondary | ICD-10-CM | POA: Diagnosis not present

## 2024-09-27 DIAGNOSIS — I10 Essential (primary) hypertension: Secondary | ICD-10-CM

## 2024-09-27 DIAGNOSIS — I480 Paroxysmal atrial fibrillation: Secondary | ICD-10-CM | POA: Diagnosis not present

## 2024-09-27 LAB — LIPID PANEL
Chol/HDL Ratio: 2.1 ratio (ref 0.0–5.0)
Cholesterol, Total: 188 mg/dL (ref 100–199)
HDL: 89 mg/dL (ref >39)
LDL Chol Calc (NIH): 88 mg/dL (ref 0–99)
Triglycerides: 60 mg/dL (ref 0–149)
VLDL Cholesterol Cal: 11 mg/dL (ref 5–40)

## 2024-09-27 NOTE — Assessment & Plan Note (Addendum)
-   Doing well from a device standpoint.  Remains asymptomatic. -Has follow-up with EP in February. -No changes at this time. Follow-up in 12 months

## 2024-09-27 NOTE — Assessment & Plan Note (Signed)
-   His blood pressure is at goal without antihypertensives. -No changes

## 2024-09-27 NOTE — Patient Instructions (Signed)
 Medication Instructions:  Your physician recommends that you continue on your current medications as directed. Please refer to the Current Medication list given to you today.  *If you need a refill on your cardiac medications before your next appointment, please call your pharmacy*  Lab Work: Lipid panel-- Today  You may go to any [facility] Location for your lab work:  [facility] - [address] ([facility]) - [address] [phone] [address]  [place] - [address]   [place]  - [address]   [place] - [address] - [address] [place]  - [address] ([facility]   If you have labs (blood work) drawn today and your tests are completely normal, you will receive your results only by: [employer] (if you have MyChart)  If you have any lab test that is abnormal or we need to change your treatment, we will call you or send a MyChart message to review the results.  Testing/Procedures: None ordered.  Follow-Up: At [facility], you and your health needs are our priority.  As part of our continuing mission to provide you with exceptional heart care, we have created designated Provider Care Teams.  These Care Teams include your primary Cardiologist (physician) and Advanced Practice Providers (APPs -  Physician Assistants and Nurse Practitioners) who all work together to provide you with the care you need, when you need it.   Your next appointment:   1 year(s)  The format for your next appointment:   In Person  Provider:   Georganna Archer, MD

## 2024-09-27 NOTE — Assessment & Plan Note (Signed)
-   Has not had a lipid panel checked in quite some time. - Will check today. Check lipid panel

## 2024-10-01 ENCOUNTER — Telehealth: Payer: Self-pay

## 2024-10-01 NOTE — Progress Notes (Signed)
 Complex Care Management Note  Care Guide Note 10/01/2024 Name: [name] MRN: [id number] DOB: 10/22/1941  [name] is a 83 y.o. year old male who sees Cleotilde Planas, MD for primary care. I reached out to [name] by phone today to offer complex care management services.  [name] was given information about Complex Care Management services today including:   The Complex Care Management services include support from the care team which includes your Nurse Care Manager, Clinical Social Worker, or Pharmacist.  The Complex Care Management team is here to help remove barriers to the health concerns and goals most important to you. Complex Care Management services are voluntary, and the patient may decline or stop services at any time by request to their care team member.   Complex Care Management Consent Status: Patient agreed to services and verbal consent obtained.   Follow up plan:  Telephone appointment with complex care management team member scheduled for:  10/12/2024  Encounter Outcome:  Patient Scheduled  .Debbe Fuse [facility] Guide  Direct Dial: [phone]  Fax [phone]

## 2024-10-05 ENCOUNTER — Telehealth: Payer: Self-pay

## 2024-10-05 DIAGNOSIS — F0280 Dementia in other diseases classified elsewhere without behavioral disturbance: Secondary | ICD-10-CM

## 2024-10-06 DIAGNOSIS — Z4422 Encounter for fitting and adjustment of artificial left eye: Secondary | ICD-10-CM | POA: Diagnosis not present

## 2024-10-12 ENCOUNTER — Other Ambulatory Visit: Payer: Self-pay | Admitting: Licensed Clinical Social Worker

## 2024-10-12 NOTE — Patient Outreach (Signed)
 Complex Care Management   Visit Note  10/12/2024  Name:  [name] MRN: [id number] DOB: 1941/01/17  Situation: Referral received for Complex Care Management related to Mental/Behavioral Health diagnosis Dementia. I obtained verbal consent from Caregiver POA.  Visit completed with Caregiver  on the phone  Background:   Past Medical History:  Diagnosis Date   Arthritis    Attention deficit disorder    Blind left eye    Diabetes mellitus without complication (HCC)    Hyperlipidemia    Hypertension     Assessment: Patient Reported Symptoms:  Cognitive Cognitive Status: Struggling with memory recall, Requires Assistance Decision Making Cognitive/Intellectual Conditions Management [RPT]: Behavior Disorders Behavior Disorders: Dementia Dx   Health Maintenance Behaviors: Annual physical exam Healing Pattern: Average Health Facilitated by: Rest  Neurological Neurological Review of Symptoms: Other: [place] Neurological Symptoms/Conditions [RPT]: Pt takes aricept  medication for dementia Neurological Management Strategies: Adequate rest, Routine screening, Coping strategies Neurological Self-Management Outcome: 4 (good)  HEENT HEENT Symptoms Reported: No symptoms reported HEENT Management Strategies: Routine screening HEENT Self-Management Outcome: 4 (good)    Cardiovascular Cardiovascular Symptoms Reported: No symptoms reported Cardiovascular Management Strategies: Adequate rest Cardiovascular Self-Management Outcome: 4 (good)  Respiratory Respiratory Symptoms Reported: No symptoms reported Respiratory Management Strategies: Coping strategies, Routine screening  Endocrine Endocrine Symptoms Reported: No symptoms reported Is patient diabetic?: Yes Is patient checking blood sugars at home?: Yes Endocrine Self-Management Outcome: 4 (good)  Gastrointestinal Gastrointestinal Symptoms Reported: No symptoms reported      Genitourinary Genitourinary Symptoms Reported: No symptoms  reported    Integumentary Integumentary Symptoms Reported: No symptoms reported Skin Management Strategies: Routine screening  Musculoskeletal Musculoskelatal Symptoms Reviewed: Weakness Musculoskeletal Management Strategies: Adequate rest, Routine screening Musculoskeletal Self-Management Outcome: 4 (good)      Psychosocial Psychosocial Symptoms Reported: Other Other Psychosocial Conditions: Memory Loss/Dementia Dx Behavioral Management Strategies: Coping strategies, Medication therapy, Adequate rest, Optimal nutrition intake, Support system Behavioral Health Self-Management Outcome: 4 (good) Major Change/Loss/Stressor/Fears (CP): Denies Techniques to Cope with Loss/Stress/Change: Diversional activities, Medication Quality of Family Relationships: helpful, involved, supportive Do you feel physically threatened by others?: No    10/12/2024    PHQ2-9 Depression Screening   Little interest or pleasure in doing things Not at all  Feeling down, depressed, or hopeless Not at all  PHQ-2 - Total Score 0  Trouble falling or staying asleep, or sleeping too much    Feeling tired or having little energy    Poor appetite or overeating     Feeling bad about yourself - or that you are a failure or have let yourself or your family down    Trouble concentrating on things, such as reading the newspaper or watching television    Moving or speaking so slowly that other people could have noticed.  Or the opposite - being so fidgety or restless that you have been moving around a lot more than usual    Thoughts that you would be better off dead, or hurting yourself in some way    PHQ2-9 Total Score    If you checked off any problems, how difficult have these problems made it for you to do your work, take care of things at home, or get along with other people    Depression Interventions/Treatment      There were no vitals filed for this visit.    Medications Reviewed Today     Reviewed by Merlynn Lyle CROME, LCSW (Social Worker) on 10/12/24 at 0920  Med List Status: <None>  Medication Order Taking? Sig Documenting Provider Last Dose Status Informant  aspirin  EC 81 MG tablet [id number] No Take 1 tablet (81 mg total) by mouth daily. Swallow whole. Claudene Victory ORN, MD 09/06/2024 Morning Active Family Member, Pharmacy Records, Self  donepezil  (ARICEPT ) 5 MG tablet [id number] No Take 5 mg by mouth at bedtime. [provider] 09/06/2024 Morning Active Family Member, Pharmacy Records, Self  magnesium  oxide (MAG-OX) 400 (240 Mg) MG tablet [id number] No Take 0.5 tablets (200 mg total) by mouth 2 (two) times daily. Madelyne Owen LABOR, MD 09/06/2024 Morning Active Family Member, Pharmacy Records, Self  mirtazapine  (REMERON ) 15 MG tablet [id number] No Take 15 mg by mouth at bedtime. [provider] Past Week Active Family Member, Pharmacy Records, Self  omeprazole (PRILOSEC) 20 MG capsule [id number] No Take 20 mg by mouth 2 (two) times daily before a meal. [provider] 09/06/2024 Morning Active Family Member, Pharmacy Records, Self  QUEtiapine  (SEROQUEL ) 25 MG tablet [id number] No Take 1 tablet (25 mg total) by mouth at bedtime. Regalado, Belkys A, MD 09/06/2024 Morning Active Family Member, Pharmacy Records, Self            Recommendation:   PCP Follow-up Specialty provider follow-up Consider day programs. Contact [facility] and Chief Executive Officer ([facility] [place].) Also re-consider contacting DSS for their In Home Aide Program Wait list Continue Current Plan of Care  Follow Up Plan:   Closing From:  Complex Care Management- Spouse denies needing follow up. Patient has met all care management goals. [facility] successfully provided. Care Management case will be closed. Patient has been provided contact information should new needs arise.   Lyle Rung, BSW, MSW, LCSW Licensed Clinical Social Worker [employer] [facility]   [email or web address] Direct Dial: [phone]

## 2024-10-12 NOTE — Patient Instructions (Signed)
 Visit Information  Thank you for taking time to visit with me today. Please don't hesitate to contact me if I can be of assistance to you in the future!  Following is a copy of your care plan:   Goals Addressed             This Visit's Progress    VBCI Social Work Care Plan        Level of Care Concerns in a patient with Dementia, DMII, and HTN Current level of care: home with spouse and granddaughter (daughter comes by often to assist as well) Evaluation of patient's unmet needs in current living environment ADL's Assessed needs, level of care concerns, how currently meeting needs and barriers to care Discuss community support options (DSS In Home [address], [facility] and [facility] [place]) Reviewed basic eligibility and provided education on Personal Care Service process, Solution-Focused Strategies employed: other options to improve quality of life and safety within the home  Facility  Assessed needs and reviewed facility placement process; as well as the different levels of care Emotional Support Provided Motivational Interviewing employed Family does not want LTC placement at this time  Mental Health:  Evaluation of current treatment plan and Caregiver Stress Active listening / Reflection utilized Consideration on in-home help encouraged : options discussed Financial Risk Analyst / information provided Depression screen reviewed Patient is taking medication for dementia  Participation in support group encouraged : Caregiver Relief Support Groups PHQ2/PHQ9 completed Provided general psycho-education for mental health needs Quality of sleep assessed & Sleep Hygiene techniques promoted Reviewed mental health medications and discussed importance of compliance:  Suicidal Ideation/Homicidal Ideation assessed: No SI/HI, family made aware of 50 resource Spouse denies need for follow up  Patient Goals/Self-Care Activities:  Increase  coping skills, healthy habits, self-management skills, and stress reduction  Plan:   The care management team will close case at this time         Please call the Suicide and Crisis Lifeline: 988 call the [place]  National Suicide Prevention Lifeline: 725-770-4356 or TTY: 236-866-1540 TTY 534-618-4881) to talk to a trained counselor call 1-800-273-TALK (toll free, 24 hour hotline) go to [facility] [address], [place] [phone]) call 911 if you are experiencing a Mental Health or Behavioral Health Crisis or need someone to talk to.  Spouse verbalized understanding of Care plan and visit instructions communicated this visit  Lyle Rung, BSW, MSW, LCSW Licensed Clinical Social Worker [employer] [facility]   [email or web address] Direct Dial: [phone]

## 2024-10-22 ENCOUNTER — Ambulatory Visit: Attending: Family Medicine

## 2024-10-28 ENCOUNTER — Ambulatory Visit: Attending: Family Medicine

## 2024-10-28 DIAGNOSIS — I442 Atrioventricular block, complete: Secondary | ICD-10-CM

## 2024-10-29 LAB — CUP PACEART REMOTE DEVICE CHECK
Battery Remaining Longevity: 120 mo
Battery Voltage: 3.09 V
Brady Statistic AS VP Percent: 99.44 %
Brady Statistic AS VS Percent: 0.56 %
Brady Statistic RV Percent Paced: 72.08 %
Date Time Interrogation Session: 20251211175202
Implantable Pulse Generator Implant Date: 20251024
Lead Channel Impedance Value: 590 Ohm
Lead Channel Pacing Threshold Amplitude: 0.5 V
Lead Channel Pacing Threshold Pulse Width: 0.24 ms
Lead Channel Sensing Intrinsic Amplitude: >=5
Lead Channel Setting Pacing Amplitude: 2.13 V
Lead Channel Setting Pacing Pulse Width: 0.24 ms
Lead Channel Setting Sensing Sensitivity: 2 mV

## 2024-11-04 NOTE — Progress Notes (Signed)
 Remote PPM Transmission

## 2024-11-27 ENCOUNTER — Emergency Department (HOSPITAL_COMMUNITY)

## 2024-11-27 ENCOUNTER — Encounter (HOSPITAL_COMMUNITY): Payer: Self-pay

## 2024-11-27 ENCOUNTER — Inpatient Hospital Stay (HOSPITAL_COMMUNITY)
Admission: EM | Admit: 2024-11-27 | Discharge: 2024-12-03 | DRG: 871 | Disposition: A | Discharge status: Home / Self Care | Attending: Family Medicine | Admitting: Family Medicine

## 2024-11-27 ENCOUNTER — Other Ambulatory Visit: Payer: Self-pay

## 2024-11-27 DIAGNOSIS — H5462 Unqualified visual loss, left eye, normal vision right eye: Secondary | ICD-10-CM | POA: Diagnosis present

## 2024-11-27 DIAGNOSIS — N1831 Chronic kidney disease, stage 3a: Secondary | ICD-10-CM | POA: Diagnosis present

## 2024-11-27 DIAGNOSIS — F32A Depression, unspecified: Secondary | ICD-10-CM | POA: Diagnosis present

## 2024-11-27 DIAGNOSIS — Z96642 Presence of left artificial hip joint: Secondary | ICD-10-CM | POA: Diagnosis present

## 2024-11-27 DIAGNOSIS — I480 Paroxysmal atrial fibrillation: Secondary | ICD-10-CM | POA: Diagnosis present

## 2024-11-27 DIAGNOSIS — Z86718 Personal history of other venous thrombosis and embolism: Secondary | ICD-10-CM | POA: Diagnosis not present

## 2024-11-27 DIAGNOSIS — A419 Sepsis, unspecified organism: Secondary | ICD-10-CM | POA: Diagnosis present

## 2024-11-27 DIAGNOSIS — Z1152 Encounter for screening for COVID-19: Secondary | ICD-10-CM | POA: Diagnosis not present

## 2024-11-27 DIAGNOSIS — Z01810 Encounter for preprocedural cardiovascular examination: Secondary | ICD-10-CM

## 2024-11-27 DIAGNOSIS — N179 Acute kidney failure, unspecified: Secondary | ICD-10-CM | POA: Diagnosis not present

## 2024-11-27 DIAGNOSIS — Z7982 Long term (current) use of aspirin: Secondary | ICD-10-CM

## 2024-11-27 DIAGNOSIS — G9341 Metabolic encephalopathy: Secondary | ICD-10-CM | POA: Diagnosis present

## 2024-11-27 DIAGNOSIS — R7401 Elevation of levels of liver transaminase levels: Secondary | ICD-10-CM | POA: Diagnosis present

## 2024-11-27 DIAGNOSIS — I129 Hypertensive chronic kidney disease with stage 1 through stage 4 chronic kidney disease, or unspecified chronic kidney disease: Secondary | ICD-10-CM | POA: Diagnosis present

## 2024-11-27 DIAGNOSIS — Z87891 Personal history of nicotine dependence: Secondary | ICD-10-CM | POA: Diagnosis not present

## 2024-11-27 DIAGNOSIS — I2510 Atherosclerotic heart disease of native coronary artery without angina pectoris: Secondary | ICD-10-CM | POA: Diagnosis present

## 2024-11-27 DIAGNOSIS — K219 Gastro-esophageal reflux disease without esophagitis: Secondary | ICD-10-CM | POA: Diagnosis present

## 2024-11-27 DIAGNOSIS — E860 Dehydration: Secondary | ICD-10-CM | POA: Diagnosis present

## 2024-11-27 DIAGNOSIS — B9620 Unspecified Escherichia coli [E. coli] as the cause of diseases classified elsewhere: Secondary | ICD-10-CM | POA: Diagnosis present

## 2024-11-27 DIAGNOSIS — Z8249 Family history of ischemic heart disease and other diseases of the circulatory system: Secondary | ICD-10-CM | POA: Diagnosis not present

## 2024-11-27 DIAGNOSIS — R7881 Bacteremia: Principal | ICD-10-CM | POA: Diagnosis present

## 2024-11-27 DIAGNOSIS — K3189 Other diseases of stomach and duodenum: Secondary | ICD-10-CM | POA: Diagnosis present

## 2024-11-27 DIAGNOSIS — E119 Type 2 diabetes mellitus without complications: Secondary | ICD-10-CM

## 2024-11-27 DIAGNOSIS — R400 Somnolence: Secondary | ICD-10-CM

## 2024-11-27 DIAGNOSIS — Z950 Presence of cardiac pacemaker: Secondary | ICD-10-CM

## 2024-11-27 DIAGNOSIS — R509 Fever, unspecified: Principal | ICD-10-CM

## 2024-11-27 DIAGNOSIS — Z87440 Personal history of urinary (tract) infections: Secondary | ICD-10-CM | POA: Diagnosis not present

## 2024-11-27 DIAGNOSIS — K5900 Constipation, unspecified: Secondary | ICD-10-CM | POA: Diagnosis present

## 2024-11-27 DIAGNOSIS — E785 Hyperlipidemia, unspecified: Secondary | ICD-10-CM | POA: Diagnosis present

## 2024-11-27 DIAGNOSIS — Z79899 Other long term (current) drug therapy: Secondary | ICD-10-CM

## 2024-11-27 DIAGNOSIS — I10 Essential (primary) hypertension: Secondary | ICD-10-CM | POA: Diagnosis present

## 2024-11-27 DIAGNOSIS — E1122 Type 2 diabetes mellitus with diabetic chronic kidney disease: Secondary | ICD-10-CM | POA: Diagnosis present

## 2024-11-27 LAB — URINALYSIS, W/ REFLEX TO CULTURE (INFECTION SUSPECTED)
Bacteria, UA: NONE SEEN
Bilirubin Urine: NEGATIVE
Glucose, UA: NEGATIVE mg/dL
Hgb urine dipstick: NEGATIVE
Ketones, ur: NEGATIVE mg/dL
Nitrite: NEGATIVE
Protein, ur: NEGATIVE mg/dL
Specific Gravity, Urine: 1.026 (ref 1.005–1.030)
pH: 6 (ref 5.0–8.0)

## 2024-11-27 LAB — COMPREHENSIVE METABOLIC PANEL WITH GFR
ALT: 322 U/L — ABNORMAL HIGH (ref 0–44)
ALT: 253 U/L — ABNORMAL HIGH (ref 0–44)
AST: 667 U/L — ABNORMAL HIGH (ref 15–41)
AST: 328 U/L — ABNORMAL HIGH (ref 15–41)
Albumin: 3.9 g/dL (ref 3.5–5.0)
Albumin: 3.6 g/dL (ref 3.5–5.0)
Alkaline Phosphatase: 318 U/L — ABNORMAL HIGH (ref 38–126)
Alkaline Phosphatase: 265 U/L — ABNORMAL HIGH (ref 38–126)
Anion gap: 13 (ref 5–15)
Anion gap: 10 (ref 5–15)
BUN: 21 mg/dL (ref 8–23)
BUN: 20 mg/dL (ref 8–23)
CO2: 23 mmol/L (ref 22–32)
CO2: 24 mmol/L (ref 22–32)
Calcium: 9.4 mg/dL (ref 8.9–10.3)
Calcium: 9 mg/dL (ref 8.9–10.3)
Chloride: 99 mmol/L (ref 98–111)
Chloride: 103 mmol/L (ref 98–111)
Creatinine, Ser: 1.43 mg/dL — ABNORMAL HIGH (ref 0.61–1.24)
Creatinine, Ser: 1.24 mg/dL (ref 0.61–1.24)
GFR, Estimated: 49 mL/min — ABNORMAL LOW
GFR, Estimated: 58 mL/min — ABNORMAL LOW
Glucose, Bld: 198 mg/dL — ABNORMAL HIGH (ref 70–99)
Glucose, Bld: 137 mg/dL — ABNORMAL HIGH (ref 70–99)
Potassium: 4.5 mmol/L (ref 3.5–5.1)
Potassium: 4.2 mmol/L (ref 3.5–5.1)
Sodium: 135 mmol/L (ref 135–145)
Sodium: 137 mmol/L (ref 135–145)
Total Bilirubin: 1.3 mg/dL — ABNORMAL HIGH (ref 0.0–1.2)
Total Bilirubin: 1.1 mg/dL (ref 0.0–1.2)
Total Protein: 8.1 g/dL (ref 6.5–8.1)
Total Protein: 7.2 g/dL (ref 6.5–8.1)

## 2024-11-27 LAB — BLOOD CULTURE ID PANEL (REFLEXED) - BCID2

## 2024-11-27 LAB — CBC WITH DIFFERENTIAL/PLATELET
Abs Immature Granulocytes: 0.04 K/uL (ref 0.00–0.07)
Abs Immature Granulocytes: 0.08 K/uL — ABNORMAL HIGH (ref 0.00–0.07)
Basophils Absolute: 0 K/uL (ref 0.0–0.1)
Basophils Absolute: 0 K/uL (ref 0.0–0.1)
Basophils Relative: 0 %
Basophils Relative: 0 %
Eosinophils Absolute: 0 K/uL (ref 0.0–0.5)
Eosinophils Absolute: 0.1 K/uL (ref 0.0–0.5)
Eosinophils Relative: 0 %
Eosinophils Relative: 1 %
HCT: 43.1 % (ref 39.0–52.0)
HCT: 39.6 % (ref 39.0–52.0)
Hemoglobin: 14.2 g/dL (ref 13.0–17.0)
Hemoglobin: 13 g/dL (ref 13.0–17.0)
Immature Granulocytes: 0 %
Immature Granulocytes: 1 %
Lymphocytes Relative: 9 %
Lymphocytes Relative: 11 %
Lymphs Abs: 1 K/uL (ref 0.7–4.0)
Lymphs Abs: 1.2 K/uL (ref 0.7–4.0)
MCH: 29.6 pg (ref 26.0–34.0)
MCH: 30 pg (ref 26.0–34.0)
MCHC: 32.9 g/dL (ref 30.0–36.0)
MCHC: 32.8 g/dL (ref 30.0–36.0)
MCV: 89.8 fL (ref 80.0–100.0)
MCV: 91.2 fL (ref 80.0–100.0)
Monocytes Absolute: 0.9 K/uL (ref 0.1–1.0)
Monocytes Absolute: 1 K/uL (ref 0.1–1.0)
Monocytes Relative: 8 %
Monocytes Relative: 10 %
Neutro Abs: 9.5 K/uL — ABNORMAL HIGH (ref 1.7–7.7)
Neutro Abs: 8.4 K/uL — ABNORMAL HIGH (ref 1.7–7.7)
Neutrophils Relative %: 83 %
Neutrophils Relative %: 77 %
Platelets: 286 K/uL (ref 150–400)
Platelets: 196 K/uL (ref 150–400)
RBC: 4.8 MIL/uL (ref 4.22–5.81)
RBC: 4.34 MIL/uL (ref 4.22–5.81)
RDW: 14.1 % (ref 11.5–15.5)
RDW: 14.1 % (ref 11.5–15.5)
WBC: 11.4 K/uL — ABNORMAL HIGH (ref 4.0–10.5)
WBC: 10.8 K/uL — ABNORMAL HIGH (ref 4.0–10.5)
nRBC: 0 % (ref 0.0–0.2)
nRBC: 0 % (ref 0.0–0.2)

## 2024-11-27 LAB — RESP PANEL BY RT-PCR (RSV, FLU A&B, COVID)  RVPGX2
Influenza A by PCR: NEGATIVE
Influenza B by PCR: NEGATIVE
Resp Syncytial Virus by PCR: NEGATIVE
SARS Coronavirus 2 by RT PCR: NEGATIVE

## 2024-11-27 LAB — HEPATITIS PANEL, ACUTE
HCV Ab: NONREACTIVE
Hep A IgM: NONREACTIVE
Hep B C IgM: NONREACTIVE
Hepatitis B Surface Ag: NONREACTIVE

## 2024-11-27 LAB — PROTIME-INR
INR: 1.1 (ref 0.8–1.2)
Prothrombin Time: 14.4 s (ref 11.4–15.2)

## 2024-11-27 LAB — I-STAT CG4 LACTIC ACID, ED
Lactic Acid, Venous: 2.1 mmol/L (ref 0.5–1.9)
Lactic Acid, Venous: 1.5 mmol/L (ref 0.5–1.9)

## 2024-11-27 LAB — GLUCOSE, CAPILLARY: Glucose-Capillary: 128 mg/dL — ABNORMAL HIGH (ref 70–99)

## 2024-11-27 MED ORDER — SODIUM CHLORIDE 0.9 % IV SOLN
2.0000 g | INTRAVENOUS | Status: DC
  Administered 2024-11-27 – 2024-11-29 (×3): 2 g via INTRAVENOUS
  Filled 2024-11-27 (×3): qty 20

## 2024-11-27 MED ORDER — IOHEXOL 300 MG/ML  SOLN
100.0000 mL | Freq: Once | INTRAMUSCULAR | Status: AC | PRN
  Administered 2024-11-27: 100 mL via INTRAVENOUS

## 2024-11-27 MED ORDER — VANCOMYCIN HCL 750 MG/150ML IV SOLN
750.0000 mg | Freq: Every day | INTRAVENOUS | Status: DC
  Filled 2024-11-27: qty 150

## 2024-11-27 MED ORDER — METRONIDAZOLE 500 MG/100ML IV SOLN
500.0000 mg | Freq: Once | INTRAVENOUS | Status: AC
  Administered 2024-11-27: 500 mg via INTRAVENOUS
  Filled 2024-11-27: qty 100

## 2024-11-27 MED ORDER — ONDANSETRON HCL 4 MG PO TABS: 4.0000 mg | ORAL_TABLET | Freq: Four times a day (QID) | ORAL | Status: DC | PRN

## 2024-11-27 MED ORDER — SODIUM CHLORIDE 0.9 % IV BOLUS
1000.0000 mL | Freq: Once | INTRAVENOUS | Status: AC
  Administered 2024-11-27: 1000 mL via INTRAVENOUS

## 2024-11-27 MED ORDER — ACETAMINOPHEN 650 MG RE SUPP: 650.0000 mg | Freq: Four times a day (QID) | RECTAL | Status: DC | PRN

## 2024-11-27 MED ORDER — SODIUM CHLORIDE 0.9 % IV SOLN
2.0000 g | Freq: Once | INTRAVENOUS | Status: AC
  Administered 2024-11-27: 2 g via INTRAVENOUS
  Filled 2024-11-27: qty 12.5

## 2024-11-27 MED ORDER — ACETAMINOPHEN 325 MG PO TABS: 650.0000 mg | ORAL_TABLET | Freq: Four times a day (QID) | ORAL | Status: DC | PRN

## 2024-11-27 MED ORDER — ACETAMINOPHEN 500 MG PO TABS
1000.0000 mg | ORAL_TABLET | Freq: Once | ORAL | Status: AC
  Administered 2024-11-27: 1000 mg via ORAL
  Filled 2024-11-27: qty 2

## 2024-11-27 MED ORDER — LACTATED RINGERS IV SOLN: INTRAVENOUS | Status: AC

## 2024-11-27 MED ORDER — VANCOMYCIN HCL IN DEXTROSE 1-5 GM/200ML-% IV SOLN
1000.0000 mg | Freq: Once | INTRAVENOUS | Status: AC
  Administered 2024-11-27: 1000 mg via INTRAVENOUS
  Filled 2024-11-27: qty 200

## 2024-11-27 MED ORDER — SODIUM CHLORIDE 0.9 % IV SOLN
2.0000 g | Freq: Two times a day (BID) | INTRAVENOUS | Status: DC
  Administered 2024-11-27: 2 g via INTRAVENOUS
  Filled 2024-11-27: qty 12.5

## 2024-11-27 MED ORDER — ONDANSETRON HCL 4 MG/2ML IJ SOLN: 4.0000 mg | Freq: Four times a day (QID) | INTRAMUSCULAR | Status: DC | PRN

## 2024-11-27 MED ORDER — METRONIDAZOLE 500 MG/100ML IV SOLN
500.0000 mg | Freq: Two times a day (BID) | INTRAVENOUS | Status: DC
  Administered 2024-11-27: 500 mg via INTRAVENOUS
  Filled 2024-11-27: qty 100

## 2024-11-27 MED ORDER — PANTOPRAZOLE SODIUM 40 MG IV SOLR
40.0000 mg | Freq: Once | INTRAVENOUS | Status: AC
  Administered 2024-11-27: 40 mg via INTRAVENOUS
  Filled 2024-11-27: qty 10

## 2024-11-27 NOTE — Progress Notes (Signed)
 PHARMACY - PHYSICIAN COMMUNICATION CRITICAL VALUE ALERT - BLOOD CULTURE IDENTIFICATION (BCID)  [name] is an 84 y.o. male who presented to [facility] on 11/27/2024 with a chief complaint of encephalopathy  Assessment:    Name of physician (or Provider) Contacted: Dr Celinda  Current antibiotics: Cefepime , metronidazole , vancomycin   Changes to prescribed antibiotics recommended:  Recommendations accepted by provider - Narrow to Ceftriaxone  2g IV q24h.  Results for orders placed or performed during the hospital encounter of 11/27/24  Blood Culture ID Panel (Reflexed) (Collected: 11/27/2024  2:29 AM)  Result Value Ref Range   Enterococcus faecalis NOT DETECTED NOT DETECTED   Enterococcus Faecium NOT DETECTED NOT DETECTED   Listeria monocytogenes NOT DETECTED NOT DETECTED   Staphylococcus species NOT DETECTED NOT DETECTED   Staphylococcus aureus (BCID) NOT DETECTED NOT DETECTED   Staphylococcus epidermidis NOT DETECTED NOT DETECTED   Staphylococcus lugdunensis NOT DETECTED NOT DETECTED   Streptococcus species NOT DETECTED NOT DETECTED   Streptococcus agalactiae NOT DETECTED NOT DETECTED   Streptococcus pneumoniae NOT DETECTED NOT DETECTED   Streptococcus pyogenes NOT DETECTED NOT DETECTED   A.calcoaceticus-baumannii NOT DETECTED NOT DETECTED   Bacteroides fragilis NOT DETECTED NOT DETECTED   Enterobacterales DETECTED (A) NOT DETECTED   Enterobacter cloacae complex NOT DETECTED NOT DETECTED   Escherichia coli DETECTED (A) NOT DETECTED   Klebsiella aerogenes NOT DETECTED NOT DETECTED   Klebsiella oxytoca NOT DETECTED NOT DETECTED   Klebsiella pneumoniae NOT DETECTED NOT DETECTED   Proteus species NOT DETECTED NOT DETECTED   Salmonella species NOT DETECTED NOT DETECTED   Serratia marcescens NOT DETECTED NOT DETECTED   Haemophilus influenzae NOT DETECTED NOT DETECTED   Neisseria meningitidis NOT DETECTED NOT DETECTED   Pseudomonas aeruginosa NOT DETECTED NOT DETECTED    Stenotrophomonas maltophilia NOT DETECTED NOT DETECTED   Candida albicans NOT DETECTED NOT DETECTED   Candida auris NOT DETECTED NOT DETECTED   Candida glabrata NOT DETECTED NOT DETECTED   Candida krusei NOT DETECTED NOT DETECTED   Candida parapsilosis NOT DETECTED NOT DETECTED   Candida tropicalis NOT DETECTED NOT DETECTED   Cryptococcus neoformans/gattii NOT DETECTED NOT DETECTED   CTX-M ESBL NOT DETECTED NOT DETECTED   Carbapenem resistance IMP NOT DETECTED NOT DETECTED   Carbapenem resistance KPC NOT DETECTED NOT DETECTED   Carbapenem resistance NDM NOT DETECTED NOT DETECTED   Carbapenem resist OXA 48 LIKE NOT DETECTED NOT DETECTED   Carbapenem resistance VIM NOT DETECTED NOT DETECTED    Wanda Hasting PharmD, BCPS [facility] main pharmacy [phone] 11/27/2024 2:59 PM

## 2024-11-27 NOTE — Progress Notes (Signed)
 Pharmacy Antibiotic Note  [name] is a 84 y.o. male admitted on 11/27/2024 with sepsis.  Pharmacy has been consulted for vancomycin  dosing. Baseline SCr appears to be ~1.1 as of October 2025, although frequently rises to ~1.3 based on recent labs.  Plan: Vancomycin  1000 mg IV now, then 750 mg IV q24 hr (est AUC 420 based on SCr 1.43; Vd 0.72) Measure vancomycin  AUC at steady state as indicated SCr q48 while on vanc - will check tomorrow, as I anticipate improvement in the next 24 hr Cefepime  and Flagyl  per MD; dosing appropriate for renal function  Height: 5' 8 (172.7 cm) Weight: 68 kg (150 lb) IBW/kg (Calculated) : 68.4  Temp (24hrs), Avg:100.3 F (37.9 C), Min:98.5 F (36.9 C), Max:102.1 F (38.9 C)  Recent Labs  Lab 11/27/24 0229 11/27/24 0234 11/27/24 0500  WBC 11.4*  --   --   CREATININE 1.43*  --   --   LATICACIDVEN  --  2.1* 1.5    Estimated Creatinine Clearance: 38.3 mL/min (A) (by C-G formula based on SCr of 1.43 mg/dL (H)).    Allergies[1]  Antimicrobials this admission: 1/10 vancomycin  >>  1/10 cefepime  >>  1/10 Flagyl  >>   Dose adjustments this admission: N/a  Microbiology results: 1/10 BCx: sent 1/10 Resp PCR-3: neg  Thank you for allowing pharmacy to be a part of this patients care.  [name] A 11/27/2024 8:19 AM     [1] No Known Allergies

## 2024-11-27 NOTE — ED Notes (Signed)
Pt was found.  

## 2024-11-27 NOTE — ED Triage Notes (Signed)
 Arrives POV with spouse. She reports concern of subjective fevers as he was hot to the touch, coughing and found him babbling as getting ready for bed.   Admits a history of dementia but that this is nowhere near his normal presentation.   Recent pacemaker placement in 08/2024.   Febrile 102.1 in triage.

## 2024-11-27 NOTE — Sepsis Progress Note (Signed)
 Elink monitoring for the code sepsis protocol.

## 2024-11-27 NOTE — ED Notes (Signed)
Family remains present at bedside.

## 2024-11-27 NOTE — ED Provider Notes (Signed)
 " [facility] AT [facility] Provider Note  CSN: [id number] Arrival date & time: 11/27/24 0144  Chief Complaint(s) Altered Mental Status  History provided by wife. HPI & MDM [name] is a 84 y.o. male with a past medical history listed below including dementia who presents to the emergency department for worsening altered mental status noted this evening.  Patient also noted to be warm to the touch and concern for subjective fevers.  Wife denies any known recent infections.  No nausea or vomiting.    Altered Mental Status   Medical Decision Making Amount and/or Complexity of Data Reviewed Labs: ordered. Decision-making details documented in ED Course. Radiology: ordered and independent interpretation performed. Decision-making details documented in ED Course.  Risk OTC drugs. Prescription drug management. Decision regarding hospitalization.    AMS and subjective fever. Noted to be febrile in the emergency department with a temperature of 102.1.  Sepsis workup was initiated.  CBC showed mild leukocytosis.  No anemia. CMP without significant electrolyte derangements.  Hyperglycemia without DKA.  Mild renal insufficiency without overt AKI.  Significant transaminitis and possible biliary obstruction. Viral panel negative for COVID, influenza, RSV. Chest x-ray negative for pneumonia Lactic 2.1. Right upper quadrant ultrasound and CT abdomen pelvis ordered.  Code sepsis initiated and patient started on empiric antibiotics.  Right upper quadrant ultrasound showed cholelithiasis with decompressed gallbladder but not overtly concerning for acute cholecystitis.  CT scan of the abdomen showed distended stomach without other intra-abdominal inflammatory/infectious process or bowel obstruction.  Hepatitis panel ordered.  Will consult medicine for admission to continue workup and management.  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   Mixed diabetic hyperlipidemia associated with type 2 diabetes mellitus (HCC) 07/03/2022   GERD without esophagitis 07/03/2022   Sepsis secondary to UTI (HCC) 10/12/2019   AKI (acute kidney injury) 10/12/2019   Paroxysmal atrial fibrillation (HCC) 10/12/2019   Acute metabolic encephalopathy 10/12/2019   PAC (premature atrial contraction)    Blocked premature atrial contraction    Acute urinary retention 09/14/2019   Protein-calorie malnutrition, severe 04/28/2014   Acute blood loss anemia 04/28/2014   Septic joint (HCC) 04/25/2014   DVT of lower extremity, bilateral (HCC) 04/01/2014   Bacterial infection of knee joint (HCC) 03/29/2014   Essential hypertension 03/01/2014   Chest pain 03/01/2014   Type 2 diabetes mellitus without complication, without long-term current use of insulin  (HCC) 03/01/2014   Hyperlipidemia 03/01/2014   Home Medication(s) Prior to Admission medications  Medication Sig Start Date End Date Taking? Authorizing Provider  aspirin  EC 81 MG tablet Take 1 tablet (81 mg total) by mouth daily. Swallow whole. 06/21/22   Claudene Victory ORN, MD  donepezil  (ARICEPT ) 5 MG tablet Take 5 mg by mouth at bedtime. 09/11/22   [provider]  magnesium  oxide (MAG-OX) 400 (240 Mg) MG tablet Take 0.5 tablets (200 mg total)  by mouth 2 (two) times daily. 07/07/22   Regalado, Belkys A, MD  mirtazapine  (REMERON ) 15 MG tablet Take 15 mg by mouth at  bedtime. 05/17/22   [provider]  omeprazole (PRILOSEC) 20 MG capsule Take 20 mg by mouth 2 (two) times daily before a meal. 04/19/22   [provider]  QUEtiapine  (SEROQUEL ) 25 MG tablet Take 1 tablet (25 mg total) by mouth at bedtime. 07/07/22   Regalado, Owen LABOR, MD                                                                                                                                    Allergies Patient has no known allergies.  Review of Systems Review of Systems As noted in HPI  Physical Exam Vital Signs  I have reviewed the triage vital signs BP 114/61   Pulse 93   Temp (!) 102.1 F (38.9 C) (Oral)   Resp 14   Ht 5' 8 (1.727 m)   Wt 68 kg   SpO2 98%   BMI 22.81 kg/m   Physical Exam Vitals reviewed.  Constitutional:      General: He is not in acute distress.    Appearance: He is well-developed. He is not diaphoretic.  HENT:     Head: Normocephalic and atraumatic.     Right Ear: External ear normal.     Left Ear: External ear normal.     Nose: Nose normal.     Mouth/Throat:     Mouth: Mucous membranes are moist.  Eyes:     General: No scleral icterus.    Conjunctiva/sclera: Conjunctivae normal.  Neck:     Trachea: Phonation normal.  Cardiovascular:     Rate and Rhythm: Normal rate and regular rhythm.  Pulmonary:     Effort: Pulmonary effort is normal. No respiratory distress.     Breath sounds: No stridor.  Abdominal:     General: There is no distension.     Tenderness: There is no abdominal tenderness.  Musculoskeletal:        General: Normal range of motion.     Cervical back: Normal range of motion.  Neurological:     Mental Status: He is alert. He is disoriented.  Psychiatric:        Behavior: Behavior normal.     ED Results and Treatments Labs (all labs ordered are listed, but only abnormal results are displayed) Labs Reviewed  COMPREHENSIVE METABOLIC PANEL WITH GFR - Abnormal; Notable for the following components:       Result Value   Glucose, Bld 198 (*)    Creatinine, Ser 1.43 (*)    AST 667 (*)    ALT 322 (*)    Alkaline Phosphatase 318 (*)    Total Bilirubin 1.3 (*)    GFR, Estimated 49 (*)    All other components within normal limits  CBC WITH DIFFERENTIAL/PLATELET - Abnormal; Notable for the  following components:   WBC 11.4 (*)    Neutro Abs 9.5 (*)    All other components within normal limits  I-STAT CG4 LACTIC ACID, ED - Abnormal; Notable for the following components:   Lactic Acid, Venous 2.1 (*)    All other components within normal limits  RESP PANEL BY RT-PCR (RSV, FLU A&B, COVID)  RVPGX2  CULTURE, BLOOD (ROUTINE X 2)  CULTURE, BLOOD (ROUTINE X 2)  PROTIME-INR  URINALYSIS, W/ REFLEX TO CULTURE (INFECTION SUSPECTED)  HEPATITIS PANEL, ACUTE  I-STAT CG4 LACTIC ACID, ED                                                                                                                         EKG  EKG Interpretation Date/Time:  Saturday November 27 2024 01:57:08 EST Ventricular Rate:  90 PR Interval:    QRS Duration:  132 QT Interval:  411 QTC Calculation: 503 R Axis:   68  Text Interpretation: Atrial fibrillation Nonspecific intraventricular conduction delay Probable anteroseptal infarct, recent Confirmed by Trine Likes [id number]) on 11/27/2024 4:22:19 AM       Radiology CT CHEST ABDOMEN PELVIS W CONTRAST Result Date: 11/27/2024 EXAM: CT CHEST, ABDOMEN AND PELVIS WITH CONTRAST 11/27/2024 04:47:46 AM TECHNIQUE: CT of the chest, abdomen and pelvis was performed with the administration of 100 mL of iohexol  (OMNIPAQUE ) 300 MG/ML solution. Multiplanar reformatted images are provided for review. Automated exposure control, iterative reconstruction, and/or weight based adjustment of the mA/kV was utilized to reduce the radiation dose to as low as reasonably achievable. COMPARISON: CTA chest 05/11/2014, CT abdomen and pelvis 04/12/2022, right upper quadrant ultrasound today reported separately.  CLINICAL HISTORY: 84 year old male. Altered mental status, possible sepsis. 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 Superimposed extensive coronary artery calcified atherosclerosis in the chest. Mild to moderate thoracic aortic atherosclerosis. Heart size remains normal. There is an implantable cardiac device, possibly an internal ICD (series 2 image 40), new from prior exams. No pericardial effusion. No mediastinal, hilar or axillary lymphadenopathy. 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 LUNGS AND PLEURA: Mildly low lung volumes, decreased from previous exams. Symmetric mild dependent atelectasis. Trace retained secretions in the dependent trachea on series 4 image 90 but otherwise major airways are patent. No focal consolidation or pulmonary edema. No pleural effusion. No pneumothorax. ABDOMEN AND PELVIS: LIVER: Liver enhancement within normal limits. GALLBLADDER AND BILE DUCTS: Gallstones better demonstrated by ultrasound today. No convincing pericholecystic inflammation, gallbladder is nondilated. No biliary ductal dilatation. SPLEEN: No acute abnormality. PANCREAS: No acute abnormality. ADRENAL GLANDS: No acute abnormality. KIDNEYS, URETERS AND BLADDER: No stones in the kidneys or ureters. No hydronephrosis. No perinephric or periureteral stranding. Symmetric early renal contrast excretion on delayed images appears normal. Chronic urinary bladder wall thickening and prostatomegaly do not appear significantly changed from 2023; no acute perivesical inflammatory stranding is identified. GI AND BOWEL: The stomach is  moderately distended with gas and fluid. The pylorus and duodenum are decompressed. No active gastroduodenal inflammation is identified. No other evidence of bowel obstruction. Mild to moderate large bowel retained stool. Normal gas containing appendix on series 2  image 86. Intermittent mild motion artifact affecting some small and large bowel detail in the abdomen and pelvis. No bowel inflammation identified elsewhere. Nasogastric tube decompression may be valuable. REPRODUCTIVE ORGANS: No acute abnormality. PERITONEUM AND RETROPERITONEUM: No ascites. No free air. VASCULATURE: Aorta is normal in caliber. Moderate to severe distal abdominal aortic and bilateral iliac artery calcified atherosclerosis. Major arterial structures in the abdomen and pelvis are patent. Major portal venous structures appear patent on the delayed images. ABDOMINAL AND PELVIS LYMPH NODES: No lymphadenopathy. REPRODUCTIVE ORGANS: Chronic prostatomegaly. No acute abnormality. BONES AND SOFT TISSUES: Widespread thoracic spinal hyperostosis with flowing endplate osteophytes resulting in interbody ankylosis at some levels (probably T4 through T9). Chronically advanced lumbar facet arthropathy with bulky facet hypertrophy and chronic vacuum facet. Chronic left hip arthroplasty. Chronic asymmetric right SI joint degeneration. Streak artifact in the deep pelvis from hip arthroplasty. No acute osseous abnormality. No focal soft tissue abnormality. IMPRESSION: 1. Moderately distended stomach with gas and fluid. No gastroduodenal inflammation and no other bowel obstruction. Nasogastric decompression may be valuable. 2. No other acute or inflammatory process identified in the chest, abdomen, or pelvis. 3. Advanced calcified atherosclerosis. Mild atelectasis. Electronically signed by: Helayne Hurst MD MD 11/27/2024 05:33 AM EST RP Workstation: [id number]   US  Abdomen Limited RUQ (LIVER/GB) Result Date: 11/27/2024 EXAM: Right Upper Quadrant Abdominal Ultrasound 11/27/2024 04:11:23 AM TECHNIQUE: Real-time ultrasonography of the right upper quadrant of the abdomen was performed. COMPARISON: CT abdomen and pelvis 04/12/2022. CLINICAL HISTORY: 84 year old male. Biliary obstruction (hepatocellular carcinoma), altered  mental status. FINDINGS: LIVER: Echogenic liver (image 9). Hepatic steatosis. Hepatopetal flow in the portal vein. No intrahepatic biliary ductal dilatation. No evidence of mass. BILIARY SYSTEM: Gallbladder wall-echo-shadow sign (WES sign) indicating a gallbladder contracted around numerous stones. No pericholecystic fluid or wall thickening. Common bile duct measures 4 mm and is within normal limits. No sonographic Beverley sign is elicited. RIGHT KIDNEY: Right kidney is partially visible, negative. OTHER: No right upper quadrant ascites. IMPRESSION: 1. Gallbladder contracted around numerous stones, (WES sign). Wall measures 4 mm, but no strong sonographic evidence of acute cholecystitis. 2. No evidence of bile duct obstruction. 3. Hepatic steatosis. Electronically signed by: Helayne Hurst MD MD 11/27/2024 05:22 AM EST RP Workstation: [id number]   DG Chest Port 1 View if patient is in a treatment room. Result Date: 11/27/2024 EXAM: 1 VIEW(S) XRAY OF THE CHEST 11/27/2024 02:07:00 AM COMPARISON: 09/11/2024 CLINICAL HISTORY: Suspected Sepsis FINDINGS: LINES, TUBES AND DEVICES: Cardiac loop recorder device stable in place. LUNGS AND PLEURA: No focal pulmonary opacity. No pleural effusion. No pneumothorax. HEART AND MEDIASTINUM: No acute abnormality of the cardiac and mediastinal silhouettes. BONES AND SOFT TISSUES: Thoracic spondylosis and bilateral glenohumeral osteoarthritis. IMPRESSION: 1. No acute cardiopulmonary abnormality. Electronically signed by: Morgane Naveau MD MD 11/27/2024 02:10 AM EST RP Workstation: [id number]    Medications Ordered in ED Medications  lactated ringers  infusion (has no administration in time range)  acetaminophen  (TYLENOL ) tablet 1,000 mg (1,000 mg Oral Given 11/27/24 0217)  sodium chloride  0.9 % bolus 1,000 mL (1,000 mLs Intravenous New Bag/Given 11/27/24 0344)  ceFEPIme  (MAXIPIME ) 2 g in sodium chloride  0.9 % 100 mL IVPB (0 g Intravenous Stopped 11/27/24 0417)  metroNIDAZOLE   (FLAGYL ) IVPB 500 mg (0 mg Intravenous Stopped 11/27/24 0502)  vancomycin  (  VANCOCIN ) IVPB 1000 mg/200 mL premix (0 mg Intravenous Stopped 11/27/24 0455)  iohexol  (OMNIPAQUE ) 300 MG/ML solution 100 mL (100 mLs Intravenous Contrast Given 11/27/24 0426)   Procedures .Critical Care  Performed by: Trine Raynell Moder, MD Authorized by: Trine Raynell Moder, MD   Critical care provider statement:    Critical care time (minutes):  45   Critical care time was exclusive of:  Separately billable procedures and treating other patients   Critical care was necessary to treat or prevent imminent or life-threatening deterioration of the following conditions:  Sepsis   Critical care was time spent personally by me on the following activities:  Development of treatment plan with patient or surrogate, discussions with consultants, evaluation of patient's response to treatment, examination of patient, obtaining history from patient or surrogate, review of old charts, re-evaluation of patient's condition, pulse oximetry, ordering and review of radiographic studies, ordering and review of laboratory studies and ordering and performing treatments and interventions   Care discussed with: admitting provider     (including critical care time)   This chart was dictated using voice recognition software.  Despite best efforts to proofread,  errors can occur which can change the documentation meaning.   Trine Raynell Moder, MD 11/27/24 [phone]  "

## 2024-11-27 NOTE — H&P (Signed)
 " History and Physical    Patient: [name] FMW:[id number] DOB: October 28, 1941 DOA: 11/27/2024 DOS: the patient was seen and examined on 11/27/2024 PCP: Cleotilde Planas, MD  Patient coming from: Home  Chief Complaint:  Chief Complaint  Patient presents with   Altered Mental Status   HPI: [name] is a 84 y.o. male with medical history significant of osteoarthritis, history of septic joint, urinary retention, attention deficit disorder, left eye blindness, GERD, severe protein calorie malnutrition, type 2 diabetes, hyperlipidemia, hypertension, history of bilateral DVT, paroxysmal atrial fibrillation not on anticoagulation due to epistaxis, history of type I second-degree AV block, history of complete AV heart block, history of pacemaker placement, history of dementia who was brought to the emergency department via private vehicle by his spouse due to fevers, and AMS.  The patient started babbling before getting ready for bed.  He had dinner around 1600.  No nausea or vomiting.  No complaints of abdominal pain, but his wife stated that he had a soft stool bowel movement earlier. She also stated that he has felt warm to the touch.  No sick contacts or travel history.  Lab work: CBC showed a white count 11.4, hemoglobin 14.2 g/dL and platelets 713.  Lactic acid was 2.1 then 1.5 mmol/L.  Coronavirus, influenza and RSV PCR test was negative.  CMP showed normal electrolytes.  Total bilirubin 1.3, glucose 198, BUN 21 and creatinine 1.43 mg/dL.  Total protein 8.1 and albumin 3.9 g/dL; AST 332, ALT 677 and alkaline phosphatase 318 units/L.  Imaging: Portable 1 view chest radiograph with no acute cardiopulmonary abnormality.  RUQ ultrasound showing contracted gallbladder with numerous stones.  No evidence of acute cholecystitis.  No bile duct obstruction.  Hepatic asteatosis.  CT chest/abdomen/pelvis with contrast showing moderately distended stomach with gas and fluid.  No gastroduodenal inflammation  and no other bowel obstruction.  Nasogastric decompression may be valuable.  No convincing pericholecystic inflammation, gallbladder is nondilated.  No biliary ductal dilatation no other acute or inflammatory process identified in the chest, abdomen, or pelvis.  There is advanced calcified atherosclerosis.  Mild atelectasis.   ED course: Initial vital signs were temperature102.1 F, pulse 82, respiration 18, BP 107/55 mmHg and O2 sat 98% on room air.  The patient received 1000 mL of normal saline bolus followed by LR at 150 mL/h x 20 hours, cefepime  2 g IVPB, metronidazole  500 mg IVPB and vancomycin  1000 mg IVPB.  Review of Systems: As mentioned in the history of present illness. All other systems reviewed and are negative. Past Medical History:  Diagnosis Date   Arthritis    Attention deficit disorder    Blind left eye    Diabetes mellitus without complication (HCC)    Hyperlipidemia    Hypertension    Past Surgical History:  Procedure Laterality Date   EYE SURGERY Left    x3   KNEE ARTHROSCOPY Bilateral 03/29/2014   Procedure: RIGHT KNEE ARTHROSCOPY AND DEBRIDEMENT, LEFT KNEE ASPIRATION,  LEFT KNEE ARTHROSCOPY AND DEBRIDEMENT.;  Surgeon: Cordella Glendia Hutchinson, MD;  Location: [facility] OR;  Service: Orthopedics;  Laterality: Bilateral;  RIGHT KNEE ARTHROSCOPY AND DEBRIDEMENT, LEFT KNEE ASPIRATION,  LEFT KNEE ARTHROSCOPY AND DEBRIDEMENT.   KNEE ARTHROTOMY Bilateral 04/26/2014   Procedure: KNEE ARTHROTOMY, SYNOVECTOMY, ANTIBIOTIC BEAD PLACEMENT.;  Surgeon: Cordella Glendia Hutchinson, MD;  Location: [facility] OR;  Service: Orthopedics;  Laterality: Bilateral;   lipoma removal, right upper back     PACEMAKER LEADLESS INSERTION N/A 09/10/2024   Procedure: PACEMAKER LEADLESS INSERTION;  Surgeon: Almetta Donnice LABOR, MD;  Location: [facility];  Service: Cardiovascular;  Laterality: N/A;   TONSILLECTOMY     TOTAL HIP ARTHROPLASTY Left 09/14/2019   TOTAL HIP ARTHROPLASTY Left 09/14/2019   Procedure: LEFT TOTAL HIP  ARTHROPLASTY ANTERIOR APPROACH;  Surgeon: Addie Cordella Hamilton, MD;  Location: [facility] OR;  Service: Orthopedics;  Laterality: Left;   Social History:  reports that he has quit smoking. He has never used smokeless tobacco. He reports current alcohol use. He reports that he does not use drugs.  Allergies[1]  Family History  Problem Relation Age of Onset   Heart attack Father    Heart attack Sister    Heart attack Brother     Prior to Admission medications  Medication Sig Start Date End Date Taking? Authorizing Provider  aspirin  EC 81 MG tablet Take 1 tablet (81 mg total) by mouth daily. Swallow whole. 06/21/22   Claudene Victory ORN, MD  donepezil  (ARICEPT ) 5 MG tablet Take 5 mg by mouth at bedtime. 09/11/22   [provider]  magnesium  oxide (MAG-OX) 400 (240 Mg) MG tablet Take 0.5 tablets (200 mg total) by mouth 2 (two) times daily. 07/07/22   Regalado, Belkys A, MD  mirtazapine  (REMERON ) 15 MG tablet Take 15 mg by mouth at bedtime. 05/17/22   [provider]  omeprazole (PRILOSEC) 20 MG capsule Take 20 mg by mouth 2 (two) times daily before a meal. 04/19/22   [provider]  QUEtiapine  (SEROQUEL ) 25 MG tablet Take 1 tablet (25 mg total) by mouth at bedtime. 07/07/22   Madelyne Owen LABOR, MD    Physical Exam: Vitals:   11/27/24 0200 11/27/24 0215 11/27/24 0400 11/27/24 0602  BP: (!) 107/55 (!) 102/54 114/61 (!) 131/54  Pulse: 82 88 93 76  Resp: 18 17 14 14   Temp:    98.5 F (36.9 C)  TempSrc:    Oral  SpO2: 98% 99% 98% 98%  Weight:      Height:       Physical Exam Vitals and nursing note reviewed.  Constitutional:      General: He is awake. He is not in acute distress.    Appearance: Normal appearance. He is normal weight. He is ill-appearing.  HENT:     Head: Normocephalic.     Nose: No rhinorrhea.     Mouth/Throat:     Mouth: Mucous membranes are dry.  Eyes:     General: No scleral icterus.    Pupils: Pupils are equal, round, and reactive to light.  Neck:      Vascular: No JVD.  Cardiovascular:     Rate and Rhythm: Normal rate and regular rhythm.     Heart sounds: S1 normal and S2 normal.  Pulmonary:     Effort: Pulmonary effort is normal.     Breath sounds: Normal breath sounds. No wheezing, rhonchi or rales.  Abdominal:     General: Bowel sounds are normal. There is no distension.     Palpations: Abdomen is soft.     Tenderness: There is no abdominal tenderness. There is no right CVA tenderness, left CVA tenderness or guarding.  Musculoskeletal:     Cervical back: Neck supple.     Right lower leg: No edema.     Left lower leg: No edema.  Skin:    General: Skin is warm and dry.  Neurological:     General: No focal deficit present.     Mental Status: He is alert and oriented  to person, place, and time.  Psychiatric:        Mood and Affect: Mood normal.        Behavior: Behavior normal. Behavior is cooperative.     Data Reviewed:  Results are pending, will review when available.  07/02/2022 TTE REPORT. IMPRESSIONS:   1. Left ventricular ejection fraction, by estimation, is 60 to 65%. The  left ventricle has normal function. The left ventricle has no regional  wall motion abnormalities. Left ventricular diastolic parameters were  normal.   2. Right ventricular systolic function is normal. The right ventricular  size is normal.   3. Left atrial size was moderately dilated.   4. The mitral valve is abnormal. Trivial mitral valve regurgitation. No  evidence of mitral stenosis.   5. The aortic valve is tricuspid. There is mild calcification of the  aortic valve. There is mild thickening of the aortic valve. Aortic valve  regurgitation is not visualized. Aortic valve sclerosis is present, with  no evidence of aortic valve stenosis.   6. The inferior vena cava is normal in size with greater than 50%  respiratory variability, suggesting right atrial pressure of 3 mmHg.   EKG: Vent. rate 90 BPM  PR interval * ms  QRS duration  132 ms  QT/QTcB 411/503 ms  P-R-T axes * 68 69  Atrial fibrillation  Nonspecific intraventricular conduction delay  Probable anteroseptal infarct, recent  Assessment and Plan: Principal Problem:   Acute metabolic encephalopathy In the setting of:   Sepsis due to undetermined organism (HCC) Associated with:   Transaminitis Admit to telemetry/inpatient. Keep n.p.o. for now. Continue IV fluids. Continue cefepime  2 g every 8 hours.   Continue metronidazole  500 mg IVPB q 12 hr. Continue vancomycin  per pharmacy. Check urinalysis. -Obtain urine culture if needed. Follow-up blood culture and sensitivity Follow CBC and CMP in a.m.  Active Problems:   Paroxysmal atrial fibrillation (HCC) CHA?DS?-VASc Score of at least 5. Not on anticoagulation. Rate is controlled.    Type 2 diabetes mellitus without complication,    without long-term current use of insulin  (HCC) Carbohydrate modified diet. CBG monitoring every 6 hours. -Add on RI SS as needed. In October 22, hemoglobin A1c was 5.7%.    GERD without esophagitis Pantoprazole  40 mg IVP x 1.    Essential hypertension Currently NPO. Not on antihypertensives at home. Parenteral antihypertensives as needed.    Hyperlipidemia Currently not on medical therapy. Given patient's age and dementia history -Follow-up with PCP for treatment options.    Advance Care Planning:   Code Status: Full Code   Consults:   Family Communication:   Severity of Illness: The appropriate patient status for this patient is INPATIENT. Inpatient status is judged to be reasonable and necessary in order to provide the required intensity of service to ensure the patient's safety. The patient's presenting symptoms, physical exam findings, and initial radiographic and laboratory data in the context of their chronic comorbidities is felt to place them at high risk for further clinical deterioration. Furthermore, it is not anticipated that the patient will  be medically stable for discharge from the hospital within 2 midnights of admission.   * I certify that at the point of admission it is my clinical judgment that the patient will require inpatient hospital care spanning beyond 2 midnights from the point of admission due to high intensity of service, high risk for further deterioration and high frequency of surveillance required.*  Author: Alm Dorn Castor, MD 11/27/2024 7:43 AM  For on call review [email or web address].   This document was prepared using Dragon voice recognition software and may contain some unintended transcription errors.      [1] No Known Allergies  "

## 2024-11-28 ENCOUNTER — Inpatient Hospital Stay (HOSPITAL_COMMUNITY)

## 2024-11-28 DIAGNOSIS — Z01810 Encounter for preprocedural cardiovascular examination: Secondary | ICD-10-CM | POA: Diagnosis not present

## 2024-11-28 DIAGNOSIS — A419 Sepsis, unspecified organism: Secondary | ICD-10-CM

## 2024-11-28 LAB — CBC
HCT: 33.5 % — ABNORMAL LOW (ref 39.0–52.0)
Hemoglobin: 11.1 g/dL — ABNORMAL LOW (ref 13.0–17.0)
MCH: 29.8 pg (ref 26.0–34.0)
MCHC: 33.1 g/dL (ref 30.0–36.0)
MCV: 89.8 fL (ref 80.0–100.0)
Platelets: 201 K/uL (ref 150–400)
RBC: 3.73 MIL/uL — ABNORMAL LOW (ref 4.22–5.81)
RDW: 14.4 % (ref 11.5–15.5)
WBC: 8 K/uL (ref 4.0–10.5)
nRBC: 0 % (ref 0.0–0.2)

## 2024-11-28 LAB — GLUCOSE, CAPILLARY
Glucose-Capillary: 105 mg/dL — ABNORMAL HIGH (ref 70–99)
Glucose-Capillary: 115 mg/dL — ABNORMAL HIGH (ref 70–99)
Glucose-Capillary: 81 mg/dL (ref 70–99)
Glucose-Capillary: 83 mg/dL (ref 70–99)
Glucose-Capillary: 92 mg/dL (ref 70–99)

## 2024-11-28 LAB — COMPREHENSIVE METABOLIC PANEL WITH GFR
ALT: 138 U/L — ABNORMAL HIGH (ref 0–44)
AST: 118 U/L — ABNORMAL HIGH (ref 15–41)
Albumin: 2.9 g/dL — ABNORMAL LOW (ref 3.5–5.0)
Alkaline Phosphatase: 198 U/L — ABNORMAL HIGH (ref 38–126)
Anion gap: 9 (ref 5–15)
BUN: 16 mg/dL (ref 8–23)
CO2: 22 mmol/L (ref 22–32)
Calcium: 8.4 mg/dL — ABNORMAL LOW (ref 8.9–10.3)
Chloride: 106 mmol/L (ref 98–111)
Creatinine, Ser: 1.11 mg/dL (ref 0.61–1.24)
GFR, Estimated: >60 mL/min
Glucose, Bld: 88 mg/dL (ref 70–99)
Potassium: 4 mmol/L (ref 3.5–5.1)
Sodium: 136 mmol/L (ref 135–145)
Total Bilirubin: 1.2 mg/dL (ref 0.0–1.2)
Total Protein: 6.1 g/dL — ABNORMAL LOW (ref 6.5–8.1)

## 2024-11-28 LAB — ECHOCARDIOGRAM COMPLETE
Area-P 1/2: 2.8 cm2
Calc EF: 57.5 %
Height: 68 in
S' Lateral: 2.4 cm
Single Plane A2C EF: 62.8 %
Single Plane A4C EF: 51.9 %
Weight: 2400 [oz_av]

## 2024-11-28 MED ORDER — SENNOSIDES-DOCUSATE SODIUM 8.6-50 MG PO TABS
2.0000 | ORAL_TABLET | Freq: Two times a day (BID) | ORAL | Status: DC
  Administered 2024-11-28 – 2024-12-02 (×4): 2 via ORAL
  Filled 2024-11-28 (×4): qty 2

## 2024-11-28 MED ORDER — LACTATED RINGERS IV SOLN: INTRAVENOUS | Status: DC

## 2024-11-28 MED ORDER — BISACODYL 10 MG RE SUPP
10.0000 mg | Freq: Once | RECTAL | Status: AC
  Administered 2024-11-28: 10 mg via RECTAL
  Filled 2024-11-28: qty 1

## 2024-11-28 MED ORDER — KETOROLAC TROMETHAMINE 15 MG/ML IJ SOLN
15.0000 mg | Freq: Four times a day (QID) | INTRAMUSCULAR | Status: DC | PRN
  Administered 2024-11-28: 15 mg via INTRAVENOUS
  Filled 2024-11-28: qty 1

## 2024-11-28 NOTE — Progress Notes (Signed)
" °  Echocardiogram 2D Echocardiogram has been performed.  Shondell Fabel 11/28/2024, 2:14 PM "

## 2024-11-28 NOTE — Plan of Care (Signed)
 ?  Problem: Coping: ?Goal: Level of anxiety will decrease ?Outcome: Progressing ?  ?Problem: Safety: ?Goal: Ability to remain free from injury will improve ?Outcome: Progressing ?  ?

## 2024-11-28 NOTE — Hospital Course (Signed)
 Patient with PMH of dementia, left eye blindness, T2DM, HLD, HTN, DVT and A-fib not on anticoagulation, CHB SP PPM implant, failure to thrive presented to the hospital with complaints of confusion and fatigue. Currently found to have a E. coli bacteremia likely in the setting of acute cholecystitis.  Assessment and Plan: E. coli bacteremia. Likely in the setting of possible acute cholecystitis with sepsis present on admission. Acute metabolic encephalopathy. LFT elevation. Presented with poor p.o. intake and fatigue. Workup on admission shows AST ALT 667/322 with total bilirubin 1.3.  Serum creatinine 1.43.  Leukocytosis 11.4, Tmax 102.1.  Meeting SIRS criteria. Ultrasound abdomen shows gallstones without any evidence of acute cholecystitis. CBD 4 mm. CT abdomen also did not show any evidence of acute abnormality other than distended stomach. Patient was treated with IV antibiotic and IV fluid. LFT shows improvement. Blood cultures came back positive for E. coli. Sensitivity currently pending. Fever improved sepsis physiology resolved. General surgery consulted.  HIDA scan recommended.  Will monitor results. Surgery also requested cardiology clearance, cardiology consulted.  Gastric distention. Seen on CT scan upon admission although on repeat x-ray it appears to have resolved. Patient did not have any nausea or vomiting. For now monitor.  Severe constipation. Continue aggressive bowel regimen. Monitor.  History of paroxysmal A-fib, CHB SP PPM implant. Not on any anticoagulation due to bleeding. Rate is controlled. Has a pacemaker. Cardiology consult preop clearance.  T2DM without any complication without long-term insulin  use. Currently n.p.o. and sliding-scale insulin . Advancing to clear liquid diet. Monitor. A1c was 5.7.  GERD. Continue PPI.  HTN. Blood pressure stable. Currently holding medication.  HLD. Not on any medication. Monitor.  AKI. Baseline serum  creatinine appears to be around 1.1. Serum creatinine on admission 1.43. Likely dehydration.  Treated with IV fluid.  Monitoring.  Dementia.  Acute metabolic encephalopathy Currently oriented to place person. Monitor for delirium.

## 2024-11-28 NOTE — Consult Note (Signed)
 Reason for Consult:gallstones and sepsis Referring Physician: [name] is an 84 y.o. male.  HPI:  Patient is a 84 year old male with baseline dementia, left eye blindness, type 2 diabetes, history of bilateral DVT and A-fib with pacer for heart block was brought to the emergency department for change in mental status and vague complaints of abdominal pain.  He also was febrile.  Patient is not on anticoagulation due to history of epistaxis.  Patient had sepsis upon arrival and positive blood culture.  He has history of frequent UTIs with urinary retention and was felt to most likely have a UTI originally.  His CT scan showed chronic urinary bladder wall thickening.  He was found to have mild elevation in LFTs with a T. bili 1.3 and transaminases in the several 100 range.  Alk phos was also elevated.  This came back down.  His urinalysis was much improved compared to previous ones without evidence of bacteria on Gram stain.  His gallbladder showed gallstones, but no evidence of cholecystitis on the ultrasound or CT.  We are consulted to evaluate gallbladder as a potential source of his sepsis.  Past Medical History:  Diagnosis Date   Arthritis    Attention deficit disorder    Blind left eye    Diabetes mellitus without complication (HCC)    DVT of lower extremity, bilateral (HCC) 04/01/2014   GERD without esophagitis 07/03/2022   Hyperlipidemia    Hypertension     Past Surgical History:  Procedure Laterality Date   EYE SURGERY Left    x3   KNEE ARTHROSCOPY Bilateral 03/29/2014   Procedure: RIGHT KNEE ARTHROSCOPY AND DEBRIDEMENT, LEFT KNEE ASPIRATION,  LEFT KNEE ARTHROSCOPY AND DEBRIDEMENT.;  Surgeon: Cordella Glendia Hutchinson, MD;  Location: [facility] OR;  Service: Orthopedics;  Laterality: Bilateral;  RIGHT KNEE ARTHROSCOPY AND DEBRIDEMENT, LEFT KNEE ASPIRATION,  LEFT KNEE ARTHROSCOPY AND DEBRIDEMENT.   KNEE ARTHROTOMY Bilateral 04/26/2014   Procedure: KNEE ARTHROTOMY, SYNOVECTOMY,  ANTIBIOTIC BEAD PLACEMENT.;  Surgeon: Cordella Glendia Hutchinson, MD;  Location: [facility] OR;  Service: Orthopedics;  Laterality: Bilateral;   lipoma removal, right upper back     PACEMAKER LEADLESS INSERTION N/A 09/10/2024   Procedure: PACEMAKER LEADLESS INSERTION;  Surgeon: Almetta Donnice LABOR, MD;  Location: [facility] INVASIVE CV LAB;  Service: Cardiovascular;  Laterality: N/A;   TONSILLECTOMY     TOTAL HIP ARTHROPLASTY Left 09/14/2019   TOTAL HIP ARTHROPLASTY Left 09/14/2019   Procedure: LEFT TOTAL HIP ARTHROPLASTY ANTERIOR APPROACH;  Surgeon: Hutchinson Cordella Glendia, MD;  Location: [facility] OR;  Service: Orthopedics;  Laterality: Left;    Family History  Problem Relation Age of Onset   Heart attack Father    Heart attack Sister    Heart attack Brother     Social History:  reports that he has quit smoking. He has never used smokeless tobacco. He reports current alcohol use. He reports that he does not use drugs.  Allergies: Allergies[1]  Medications:  Current Outpatient Medications  Medication Instructions   aspirin  EC 81 mg, Oral, Daily, Swallow whole.   cyanocobalamin  (VITAMIN B12) 1,000 mcg, Daily   donepezil  (ARICEPT ) 5 mg, Nightly   magnesium  oxide (MAG-OX) 200 mg, Oral, 2 times daily   mirtazapine  (REMERON ) 15 mg, Daily at bedtime   omeprazole (PRILOSEC) 20 mg, 2 times daily before meals   QUEtiapine  (SEROQUEL ) 25 mg, Oral, Daily at bedtime     Results for orders placed or performed during the hospital encounter of 11/27/24 (from the past 48 hours)  Comprehensive metabolic panel     Status: Abnormal   Collection Time: 11/27/24  2:29 AM  Result Value Ref Range   Sodium 135 135 - 145 mmol/L   Potassium 4.5 3.5 - 5.1 mmol/L   Chloride 99 98 - 111 mmol/L   CO2 23 22 - 32 mmol/L   Glucose, Bld 198 (H) 70 - 99 mg/dL    Comment: Glucose reference range applies only to samples taken after fasting for at least 8 hours.   BUN 21 8 - 23 mg/dL   Creatinine, Ser 8.56 (H) 0.61 - 1.24 mg/dL   Calcium 9.4  8.9 - 89.6 mg/dL   Total Protein 8.1 6.5 - 8.1 g/dL   Albumin 3.9 3.5 - 5.0 g/dL   AST 332 (H) 15 - 41 U/L    Comment: HEMOLYSIS AT THIS LEVEL MAY AFFECT RESULT   ALT 322 (H) 0 - 44 U/L   Alkaline Phosphatase 318 (H) 38 - 126 U/L   Total Bilirubin 1.3 (H) 0.0 - 1.2 mg/dL   GFR, Estimated 49 (L) >60 mL/min    Comment: (NOTE) Calculated using the CKD-EPI Creatinine Equation (2021)    Anion gap 13 5 - 15    Comment: Performed at [facility], [address] [place], [place] [address]  CBC with Differential     Status: Abnormal   Collection Time: 11/27/24  2:29 AM  Result Value Ref Range   WBC 11.4 (H) 4.0 - 10.5 K/uL   RBC 4.80 4.22 - 5.81 MIL/uL   Hemoglobin 14.2 13.0 - 17.0 g/dL   HCT 56.8 60.9 - 47.9 %   MCV 89.8 80.0 - 100.0 fL   MCH 29.6 26.0 - 34.0 pg   MCHC 32.9 30.0 - 36.0 g/dL   RDW 85.8 88.4 - 84.4 %   Platelets 286 150 - 400 K/uL   nRBC 0.0 0.0 - 0.2 %   Neutrophils Relative % 83 %   Neutro Abs 9.5 (H) 1.7 - 7.7 K/uL   Lymphocytes Relative 9 %   Lymphs Abs 1.0 0.7 - 4.0 K/uL   Monocytes Relative 8 %   Monocytes Absolute 0.9 0.1 - 1.0 K/uL   Eosinophils Relative 0 %   Eosinophils Absolute 0.0 0.0 - 0.5 K/uL   Basophils Relative 0 %   Basophils Absolute 0.0 0.0 - 0.1 K/uL   Immature Granulocytes 0 %   Abs Immature Granulocytes 0.04 0.00 - 0.07 K/uL    Comment: Performed at [facility] [address] [place], [place] [address]  Protime-INR     Status: None   Collection Time: 11/27/24  2:29 AM  Result Value Ref Range   Prothrombin Time 14.4 11.4 - 15.2 seconds   INR 1.1 0.8 - 1.2    Comment: (NOTE) INR goal varies based on device and disease states. Performed at [facility], [address] [place], [place] [address]   Culture, blood (Routine x 2)     Status: Abnormal (Preliminary result)   Collection Time: 11/27/24  2:29 AM   Specimen: BLOOD LEFT FOREARM  Result Value Ref Range   Specimen  Description      BLOOD LEFT FOREARM Performed at [facility], [address] [place], [place] [address]    Special Requests      BOTTLES DRAWN AEROBIC AND ANAEROBIC Blood Culture results may not be optimal due to an inadequate volume of blood received in culture bottles Performed at [facility], [address] [place], [place] [address]    Culture  Setup Time      GRAM NEGATIVE RODS IN BOTH AEROBIC AND ANAEROBIC BOTTLES CRITICAL RESULT CALLED TO, READ BACK BY AND VERIFIED WITH: PHARMD C. SHADE 1500 [id number] FCP    Culture (A)     ESCHERICHIA COLI SUSCEPTIBILITIES TO FOLLOW Performed at [facility], [address], [place], [place] [address]    Report Status PENDING   Resp panel by RT-PCR (RSV, Flu A&B, Covid) Anterior Nasal Swab     Status: None   Collection Time: 11/27/24  2:29 AM   Specimen: Anterior Nasal Swab  Result Value Ref Range   SARS Coronavirus 2 by RT PCR NEGATIVE NEGATIVE    Comment: (NOTE) SARS-CoV-2 target nucleic acids are NOT DETECTED.  The SARS-CoV-2 RNA is generally detectable in upper respiratory specimens during the acute phase of infection. The lowest concentration of SARS-CoV-2 viral copies this assay can detect is 138 copies/mL. A negative result does not preclude SARS-Cov-2 infection and should not be used as the sole basis for treatment or other patient management decisions. A negative result may occur with  improper specimen collection/handling, submission of specimen other than nasopharyngeal swab, presence of viral mutation(s) within the areas targeted by this assay, and inadequate number of viral copies(<138 copies/mL). A negative result must be combined with clinical observations, patient history, and epidemiological information. The expected result is Negative.  Fact Sheet for Patients:  bloggercourse.com  Fact Sheet for Healthcare Providers:   seriousbroker.it  This test is no t yet approved or cleared by the [place]  FDA and  has been authorized for detection and/or diagnosis of SARS-CoV-2 by FDA under an Emergency Use Authorization (EUA). This EUA will remain  in effect (meaning this test can be used) for the duration of the COVID-19 declaration under Section 564(b)(1) of the Act, 21 U.S.C.section 360bbb-3(b)(1), unless the authorization is terminated  or revoked sooner.       Influenza A by PCR NEGATIVE NEGATIVE   Influenza B by PCR NEGATIVE NEGATIVE    Comment: (NOTE) The Xpert Xpress SARS-CoV-2/FLU/RSV plus assay is intended as an aid in the diagnosis of influenza from Nasopharyngeal swab specimens and should not be used as a sole basis for treatment. Nasal washings and aspirates are unacceptable for Xpert Xpress SARS-CoV-2/FLU/RSV testing.  Fact Sheet for Patients: bloggercourse.com  Fact Sheet for Healthcare Providers: seriousbroker.it  This test is not yet approved or cleared by the [place]  FDA and has been authorized for detection and/or diagnosis of SARS-CoV-2 by FDA under an Emergency Use Authorization (EUA). This EUA will remain in effect (meaning this test can be used) for the duration of the COVID-19 declaration under Section 564(b)(1) of the Act, 21 U.S.C. section 360bbb-3(b)(1), unless the authorization is terminated or revoked.     Resp Syncytial Virus by PCR NEGATIVE NEGATIVE    Comment: (NOTE) Fact Sheet for Patients: bloggercourse.com  Fact Sheet for Healthcare Providers: seriousbroker.it  This test is not yet approved or cleared by the [place]  FDA and has been authorized for detection and/or diagnosis of SARS-CoV-2 by FDA under an Emergency Use Authorization (EUA). This EUA will remain in effect (meaning this test can be used) for the duration of  the COVID-19 declaration under Section 564(b)(1) of the Act, 21 U.S.C. section 360bbb-3(b)(1), unless the authorization is terminated or revoked.  Performed at [facility], [address] [place], [place] [address]   Blood Culture ID Panel (Reflexed)  Status: Abnormal   Collection Time: 11/27/24  2:29 AM  Result Value Ref Range   Enterococcus faecalis NOT DETECTED NOT DETECTED   Enterococcus Faecium NOT DETECTED NOT DETECTED   Listeria monocytogenes NOT DETECTED NOT DETECTED   Staphylococcus species NOT DETECTED NOT DETECTED   Staphylococcus aureus (BCID) NOT DETECTED NOT DETECTED   Staphylococcus epidermidis NOT DETECTED NOT DETECTED   Staphylococcus lugdunensis NOT DETECTED NOT DETECTED   Streptococcus species NOT DETECTED NOT DETECTED   Streptococcus agalactiae NOT DETECTED NOT DETECTED   Streptococcus pneumoniae NOT DETECTED NOT DETECTED   Streptococcus pyogenes NOT DETECTED NOT DETECTED   A.calcoaceticus-baumannii NOT DETECTED NOT DETECTED   Bacteroides fragilis NOT DETECTED NOT DETECTED   Enterobacterales DETECTED (A) NOT DETECTED    Comment: Enterobacterales represent a large order of gram negative bacteria, not a single organism. CRITICAL RESULT CALLED TO, READ BACK BY AND VERIFIED WITH: PHARMD C. SHADE 1500 [id number] FCP    Enterobacter cloacae complex NOT DETECTED NOT DETECTED   Escherichia coli DETECTED (A) NOT DETECTED    Comment: CRITICAL RESULT CALLED TO, READ BACK BY AND VERIFIED WITH: PHARMD C. SHADE 1500 [id number] FCP    Klebsiella aerogenes NOT DETECTED NOT DETECTED   Klebsiella oxytoca NOT DETECTED NOT DETECTED   Klebsiella pneumoniae NOT DETECTED NOT DETECTED   Proteus species NOT DETECTED NOT DETECTED   Salmonella species NOT DETECTED NOT DETECTED   Serratia marcescens NOT DETECTED NOT DETECTED   Haemophilus influenzae NOT DETECTED NOT DETECTED   Neisseria meningitidis NOT DETECTED NOT DETECTED   Pseudomonas aeruginosa NOT DETECTED NOT  DETECTED   Stenotrophomonas maltophilia NOT DETECTED NOT DETECTED   Candida albicans NOT DETECTED NOT DETECTED   Candida auris NOT DETECTED NOT DETECTED   Candida glabrata NOT DETECTED NOT DETECTED   Candida krusei NOT DETECTED NOT DETECTED   Candida parapsilosis NOT DETECTED NOT DETECTED   Candida tropicalis NOT DETECTED NOT DETECTED   Cryptococcus neoformans/gattii NOT DETECTED NOT DETECTED   CTX-M ESBL NOT DETECTED NOT DETECTED   Carbapenem resistance IMP NOT DETECTED NOT DETECTED   Carbapenem resistance KPC NOT DETECTED NOT DETECTED   Carbapenem resistance NDM NOT DETECTED NOT DETECTED   Carbapenem resist OXA 48 LIKE NOT DETECTED NOT DETECTED   Carbapenem resistance VIM NOT DETECTED NOT DETECTED    Comment: Performed at [facility], [address], [place], [place] [address]  I-Stat Lactic Acid, ED     Status: Abnormal   Collection Time: 11/27/24  2:34 AM  Result Value Ref Range   Lactic Acid, Venous 2.1 (HH) 0.5 - 1.9 mmol/L   Comment NOTIFIED PHYSICIAN   Culture, blood (Routine x 2)     Status: None (Preliminary result)   Collection Time: 11/27/24  3:58 AM   Specimen: BLOOD RIGHT FOREARM  Result Value Ref Range   Specimen Description      BLOOD RIGHT FOREARM Performed at [facility], [address], [place], [place] [address]    Special Requests      BOTTLES DRAWN AEROBIC AND ANAEROBIC Blood Culture results may not be optimal due to an inadequate volume of blood received in culture bottles Performed at [facility], [address], [place], [place] [address]    Culture      NO GROWTH 1 DAY Performed at [facility], [address], [place], [place] [address]    Report Status PENDING   I-Stat Lactic Acid, ED     Status: None   Collection Time:  11/27/24  5:00 AM  Result Value Ref Range   Lactic Acid, Venous 1.5 0.5 - 1.9 mmol/L  Hepatitis panel, acute     Status: None   Collection Time: 11/27/24  7:08 AM  Result Value Ref Range    Hepatitis B Surface Ag NON REACTIVE NON REACTIVE   HCV Ab NON REACTIVE NON REACTIVE    Comment: (NOTE) Nonreactive HCV antibody screen is consistent with no HCV infections,  unless recent infection is suspected or other evidence exists to indicate HCV infection.     Hep A IgM NON REACTIVE NON REACTIVE   Hep B C IgM NON REACTIVE NON REACTIVE    Comment: Performed at [facility], [address] [place], [place] [address]  CBC with Differential     Status: Abnormal   Collection Time: 11/27/24 10:29 AM  Result Value Ref Range   WBC 10.8 (H) 4.0 - 10.5 K/uL    Comment: WHITE COUNT CONFIRMED ON SMEAR REPEATED TO VERIFY    RBC 4.34 4.22 - 5.81 MIL/uL   Hemoglobin 13.0 13.0 - 17.0 g/dL   HCT 60.3 60.9 - 47.9 %   MCV 91.2 80.0 - 100.0 fL   MCH 30.0 26.0 - 34.0 pg   MCHC 32.8 30.0 - 36.0 g/dL   RDW 85.8 88.4 - 84.4 %   Platelets 196 150 - 400 K/uL    Comment: SPECIMEN CHECKED FOR CLOTS PLATELET COUNT CONFIRMED BY SMEAR REPEATED TO VERIFY    nRBC 0.0 0.0 - 0.2 %   Neutrophils Relative % 77 %   Neutro Abs 8.4 (H) 1.7 - 7.7 K/uL   Lymphocytes Relative 11 %   Lymphs Abs 1.2 0.7 - 4.0 K/uL   Monocytes Relative 10 %   Monocytes Absolute 1.0 0.1 - 1.0 K/uL   Eosinophils Relative 1 %   Eosinophils Absolute 0.1 0.0 - 0.5 K/uL   Basophils Relative 0 %   Basophils Absolute 0.0 0.0 - 0.1 K/uL   Immature Granulocytes 1 %   Abs Immature Granulocytes 0.08 (H) 0.00 - 0.07 K/uL    Comment: Performed at [facility], [address] [place], [place] [address]  Comprehensive metabolic panel     Status: Abnormal   Collection Time: 11/27/24 10:29 AM  Result Value Ref Range   Sodium 137 135 - 145 mmol/L   Potassium 4.2 3.5 - 5.1 mmol/L   Chloride 103 98 - 111 mmol/L   CO2 24 22 - 32 mmol/L   Glucose, Bld 137 (H) 70 - 99 mg/dL    Comment: Glucose reference range applies only to samples taken after fasting for at least 8 hours.   BUN 20 8 - 23 mg/dL   Creatinine, Ser  8.75 0.61 - 1.24 mg/dL   Calcium 9.0 8.9 - 89.6 mg/dL   Total Protein 7.2 6.5 - 8.1 g/dL   Albumin 3.6 3.5 - 5.0 g/dL   AST 671 (H) 15 - 41 U/L   ALT 253 (H) 0 - 44 U/L   Alkaline Phosphatase 265 (H) 38 - 126 U/L   Total Bilirubin 1.1 0.0 - 1.2 mg/dL   GFR, Estimated 58 (L) >60 mL/min    Comment: (NOTE) Calculated using the CKD-EPI Creatinine Equation (2021)    Anion gap 10 5 - 15    Comment: Performed at [facility], [address], [place], [place] [address]  Glucose, capillary     Status: Abnormal   Collection Time: 11/27/24  4:17 PM  Result  Value Ref Range   Glucose-Capillary 128 (H) 70 - 99 mg/dL    Comment: Glucose reference range applies only to samples taken after fasting for at least 8 hours.  Urinalysis, w/ Reflex to Culture (Infection Suspected) -Urine, Clean Catch     Status: Abnormal   Collection Time: 11/27/24  9:19 PM  Result Value Ref Range   Specimen Source URINE, CATHETERIZED    Color, Urine YELLOW YELLOW   APPearance CLEAR CLEAR   Specific Gravity, Urine 1.026 1.005 - 1.030   pH 6.0 5.0 - 8.0   Glucose, UA NEGATIVE NEGATIVE mg/dL   Hgb urine dipstick NEGATIVE NEGATIVE   Bilirubin Urine NEGATIVE NEGATIVE   Ketones, ur NEGATIVE NEGATIVE mg/dL   Protein, ur NEGATIVE NEGATIVE mg/dL   Nitrite NEGATIVE NEGATIVE   Leukocytes,Ua LARGE (A) NEGATIVE   RBC / HPF 0-5 0 - 5 RBC/hpf   WBC, UA 21-50 0 - 5 WBC/hpf    Comment:        Reflex urine culture not performed if WBC <=10, OR if Squamous epithelial cells >5. If Squamous epithelial cells >5 suggest recollection.    Bacteria, UA NONE SEEN NONE SEEN   Squamous Epithelial / HPF 0-5 0 - 5 /HPF    Comment: Performed at [facility], [address] [place], [place] [address]  Glucose, capillary     Status: None   Collection Time: 11/28/24 12:33 AM  Result Value Ref Range   Glucose-Capillary 83 70 - 99 mg/dL    Comment: Glucose reference range applies only to samples taken  after fasting for at least 8 hours.  CBC     Status: Abnormal   Collection Time: 11/28/24  3:30 AM  Result Value Ref Range   WBC 8.0 4.0 - 10.5 K/uL   RBC 3.73 (L) 4.22 - 5.81 MIL/uL   Hemoglobin 11.1 (L) 13.0 - 17.0 g/dL   HCT 66.4 (L) 60.9 - 47.9 %   MCV 89.8 80.0 - 100.0 fL   MCH 29.8 26.0 - 34.0 pg   MCHC 33.1 30.0 - 36.0 g/dL   RDW 85.5 88.4 - 84.4 %   Platelets 201 150 - 400 K/uL   nRBC 0.0 0.0 - 0.2 %    Comment: Performed at [facility], [address], [place], [place] [address]  Comprehensive metabolic panel     Status: Abnormal   Collection Time: 11/28/24  3:30 AM  Result Value Ref Range   Sodium 136 135 - 145 mmol/L   Potassium 4.0 3.5 - 5.1 mmol/L   Chloride 106 98 - 111 mmol/L   CO2 22 22 - 32 mmol/L   Glucose, Bld 88 70 - 99 mg/dL    Comment: Glucose reference range applies only to samples taken after fasting for at least 8 hours.   BUN 16 8 - 23 mg/dL   Creatinine, Ser 8.88 0.61 - 1.24 mg/dL   Calcium 8.4 (L) 8.9 - 10.3 mg/dL   Total Protein 6.1 (L) 6.5 - 8.1 g/dL   Albumin 2.9 (L) 3.5 - 5.0 g/dL   AST 881 (H) 15 - 41 U/L   ALT 138 (H) 0 - 44 U/L   Alkaline Phosphatase 198 (H) 38 - 126 U/L   Total Bilirubin 1.2 0.0 - 1.2 mg/dL   GFR, Estimated >39 >39 mL/min    Comment: (NOTE) Calculated using the CKD-EPI Creatinine Equation (2021)    Anion gap 9 5 - 15    Comment: Performed at [facility], [address], [place], [place] [address]  Glucose, capillary     Status: None   Collection Time: 11/28/24  5:46 AM  Result Value Ref Range   Glucose-Capillary 92 70 - 99 mg/dL    Comment: Glucose reference range applies only to samples taken after fasting for at least 8 hours.  Glucose, capillary     Status: Abnormal   Collection Time: 11/28/24 12:00 PM  Result Value Ref Range   Glucose-Capillary 105 (H) 70 - 99 mg/dL    Comment: Glucose reference range applies only to samples taken after fasting for at least 8 hours.     CT CHEST ABDOMEN PELVIS W CONTRAST Result Date: 11/27/2024 EXAM: CT CHEST, ABDOMEN AND PELVIS WITH CONTRAST 11/27/2024 04:47:46 AM TECHNIQUE: CT of the chest, abdomen and pelvis was performed with the administration of 100 mL of iohexol  (OMNIPAQUE ) 300 MG/ML solution. Multiplanar reformatted images are provided for review. Automated exposure control, iterative reconstruction, and/or weight based adjustment of the mA/kV was utilized to reduce the radiation dose to as low as reasonably achievable. COMPARISON: CTA chest 05/11/2014, CT abdomen and pelvis 04/12/2022, right upper quadrant ultrasound today reported separately. CLINICAL HISTORY: 85 year old male. Altered mental status, possible sepsis. 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 Superimposed extensive coronary artery calcified atherosclerosis in the chest. Mild to moderate thoracic aortic atherosclerosis. Heart size remains normal. There is an implantable cardiac device, possibly an internal ICD (series 2 image 40), new from prior exams. No pericardial effusion. No mediastinal, hilar or axillary lymphadenopathy. 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 LUNGS AND PLEURA: Mildly low lung volumes, decreased from previous exams. Symmetric mild dependent atelectasis. Trace retained secretions in the dependent trachea on series 4 image 90 but otherwise major airways are patent. No focal consolidation or pulmonary edema. No pleural effusion. No pneumothorax. ABDOMEN AND PELVIS: LIVER: Liver enhancement within normal limits. GALLBLADDER AND BILE DUCTS: Gallstones better demonstrated by ultrasound today. No convincing pericholecystic inflammation, gallbladder is nondilated. No biliary ductal dilatation. SPLEEN: No acute abnormality. PANCREAS: No acute abnormality. ADRENAL GLANDS: No acute abnormality. KIDNEYS, URETERS AND  BLADDER: No stones in the kidneys or ureters. No hydronephrosis. No perinephric or periureteral stranding. Symmetric early renal contrast excretion on delayed images appears normal. Chronic urinary bladder wall thickening and prostatomegaly do not appear significantly changed from 2023; no acute perivesical inflammatory stranding is identified. GI AND BOWEL: The stomach is moderately distended with gas and fluid. The pylorus and duodenum are decompressed. No active gastroduodenal inflammation is identified. No other evidence of bowel obstruction. Mild to moderate large bowel retained stool. Normal gas containing appendix on series 2 image 86. Intermittent mild motion artifact affecting some small and large bowel detail in the abdomen and pelvis. No bowel inflammation identified elsewhere. Nasogastric tube decompression may be valuable. REPRODUCTIVE ORGANS: No acute abnormality. PERITONEUM AND RETROPERITONEUM: No ascites. No free air. VASCULATURE: Aorta is normal in caliber. Moderate to severe distal abdominal aortic and bilateral iliac artery calcified atherosclerosis. Major arterial structures in the abdomen and pelvis are patent. Major portal venous structures appear patent on the delayed images. ABDOMINAL AND PELVIS LYMPH NODES: No lymphadenopathy. REPRODUCTIVE ORGANS: Chronic prostatomegaly. No acute abnormality. BONES AND SOFT TISSUES: Widespread thoracic spinal hyperostosis with flowing endplate osteophytes resulting in interbody ankylosis at some levels (probably T4 through T9). Chronically advanced lumbar facet arthropathy with bulky facet hypertrophy and chronic vacuum facet. Chronic left hip  arthroplasty. Chronic asymmetric right SI joint degeneration. Streak artifact in the deep pelvis from hip arthroplasty. No acute osseous abnormality. No focal soft tissue abnormality. IMPRESSION: 1. Moderately distended stomach with gas and fluid. No gastroduodenal inflammation and no other bowel obstruction.  Nasogastric decompression may be valuable. 2. No other acute or inflammatory process identified in the chest, abdomen, or pelvis. 3. Advanced calcified atherosclerosis. Mild atelectasis. Electronically signed by: Helayne Hurst MD MD 11/27/2024 05:33 AM EST RP Workstation: [id number]   US  Abdomen Limited RUQ (LIVER/GB) Result Date: 11/27/2024 EXAM: Right Upper Quadrant Abdominal Ultrasound 11/27/2024 04:11:23 AM TECHNIQUE: Real-time ultrasonography of the right upper quadrant of the abdomen was performed. COMPARISON: CT abdomen and pelvis 04/12/2022. CLINICAL HISTORY: 84 year old male. Biliary obstruction (hepatocellular carcinoma), altered mental status. FINDINGS: LIVER: Echogenic liver (image 9). Hepatic steatosis. Hepatopetal flow in the portal vein. No intrahepatic biliary ductal dilatation. No evidence of mass. BILIARY SYSTEM: Gallbladder wall-echo-shadow sign (WES sign) indicating a gallbladder contracted around numerous stones. No pericholecystic fluid or wall thickening. Common bile duct measures 4 mm and is within normal limits. No sonographic Beverley sign is elicited. RIGHT KIDNEY: Right kidney is partially visible, negative. OTHER: No right upper quadrant ascites. IMPRESSION: 1. Gallbladder contracted around numerous stones, (WES sign). Wall measures 4 mm, but no strong sonographic evidence of acute cholecystitis. 2. No evidence of bile duct obstruction. 3. Hepatic steatosis. Electronically signed by: Helayne Hurst MD MD 11/27/2024 05:22 AM EST RP Workstation: [id number]   DG Chest Port 1 View if patient is in a treatment room. Result Date: 11/27/2024 EXAM: 1 VIEW(S) XRAY OF THE CHEST 11/27/2024 02:07:00 AM COMPARISON: 09/11/2024 CLINICAL HISTORY: Suspected Sepsis FINDINGS: LINES, TUBES AND DEVICES: Cardiac loop recorder device stable in place. LUNGS AND PLEURA: No focal pulmonary opacity. No pleural effusion. No pneumothorax. HEART AND MEDIASTINUM: No acute abnormality of the cardiac and mediastinal  silhouettes. BONES AND SOFT TISSUES: Thoracic spondylosis and bilateral glenohumeral osteoarthritis. IMPRESSION: 1. No acute cardiopulmonary abnormality. Electronically signed by: Morgane Naveau MD MD 11/27/2024 02:10 AM EST RP Workstation: [id number]    Review of Systems  Unable to perform ROS: Dementia   Blood pressure (!) 103/58, pulse 62, temperature 98.3 F (36.8 C), temperature source Oral, resp. rate 20, height 5' 8 (1.727 m), weight 68 kg, SpO2 98%. Physical Exam Vitals reviewed.  Constitutional:      General: He is not in acute distress.    Appearance: He is normal weight. He is not ill-appearing, toxic-appearing or diaphoretic.  HENT:     Head: Normocephalic and atraumatic.     Right Ear: External ear normal.     Left Ear: External ear normal.     Nose: No congestion.  Eyes:     General: No scleral icterus.    Extraocular Movements: Extraocular movements intact.     Conjunctiva/sclera: Conjunctivae normal.     Pupils: Pupils are equal, round, and reactive to light.  Cardiovascular:     Rate and Rhythm: Normal rate and regular rhythm.     Pulses: Normal pulses.  Pulmonary:     Effort: Pulmonary effort is normal.  Chest:     Chest wall: No tenderness.  Abdominal:     General: Abdomen is flat. There is no distension.     Palpations: Abdomen is soft. There is no mass.     Tenderness: Tenderness: mild epigastric/RUQ tenderness. There is no guarding or rebound.     Hernia: No hernia is present.  Musculoskeletal:        General: No  swelling, tenderness or deformity.     Cervical back: Neck supple. No tenderness.  Skin:    General: Skin is warm and dry.     Capillary Refill: Capillary refill takes 2 to 3 seconds.     Coloration: Skin is not jaundiced or pale.     Findings: No bruising, erythema or rash.  Neurological:     General: No focal deficit present.     Mental Status: He is alert.     Comments: Less interactive than baseline. Not oriented to place or situation.  Oriented to self earlier with TRH MD  Psychiatric:        Mood and Affect: Mood normal.     Assessment/Plan: Sepsis + blood culture for e coli Gallstones with gallbladder contraction Elevated LFTs.   Equivocal urinalysis with + leukocytes, but neg nitrite and no bacteria seen on gram stain.  H/o UTI Recent pacer (3 months ago) Dementia  Will get HIDA to eval for cholecystitis This is a reasonable cause of his sepsis given the organism that has grown and his symptoms along with his liver function tests. Could have passed stone vs a component of mirizzi syndrome causing the modest LFT elevation.  T bili peaked at 1.3 Cards eval for risk stratification/medication optimization. IV antibiotics.   Depending on results of those, consider lap chole. General anesthesia can sometimes exacerbate dementia symptoms, usually temporarily, but can take a while to get better. Patient can also experience functional decline.    NPO after midnight in case of surgery. Surgical team to follow     Jina LITTIE Nephew, MD, FACS, FSSO Surgical Oncology, General Surgery, Trauma and Critical [facility], [place] [phone] for weekday/non holidays Check [email or web address] for coverage night/weekend/holidays under General Surgery    11/28/2024, 12:25 PM         [1] No Known Allergies

## 2024-11-28 NOTE — Progress Notes (Signed)
 [facility] Progress Note Patient: [name] FMW:[id number] DOB: 08/17/1941  DOA: 11/27/2024 DOS: the patient was seen and examined on 11/28/2024  Brief Hospital Course: Patient with PMH of dementia, left eye blindness, T2DM, HLD, HTN, DVT and A-fib not on anticoagulation, CHB SP PPM implant, failure to thrive presented to the hospital with complaints of confusion and fatigue. Currently found to have a E. coli bacteremia likely in the setting of acute cholecystitis.  Assessment and Plan: E. coli bacteremia. Likely in the setting of possible acute cholecystitis with sepsis present on admission. Acute metabolic encephalopathy. LFT elevation. Presented with poor p.o. intake and fatigue. Workup on admission shows AST ALT 667/322 with total bilirubin 1.3.  Serum creatinine 1.43.  Leukocytosis 11.4, Tmax 102.1.  Meeting SIRS criteria. Ultrasound abdomen shows gallstones without any evidence of acute cholecystitis. CBD 4 mm. CT abdomen also did not show any evidence of acute abnormality other than distended stomach. Patient was treated with IV antibiotic and IV fluid. LFT shows improvement. Blood cultures came back positive for E. coli. Sensitivity currently pending. Fever improved sepsis physiology resolved. General surgery consulted.  HIDA scan recommended.  Will monitor results. Surgery also requested cardiology clearance, cardiology consulted.  Gastric distention. Seen on CT scan upon admission although on repeat x-ray it appears to have resolved. Patient did not have any nausea or vomiting. For now monitor.  Severe constipation. Continue aggressive bowel regimen. Monitor.  History of paroxysmal A-fib, CHB SP PPM implant. Not on any anticoagulation due to bleeding. Rate is controlled. Has a pacemaker. Cardiology consult preop clearance.  T2DM without any complication without long-term insulin  use. Currently n.p.o. and sliding-scale insulin . Advancing to clear liquid  diet. Monitor. A1c was 5.7.  GERD. Continue PPI.  HTN. Blood pressure stable. Currently holding medication.  HLD. Not on any medication. Monitor.  AKI. Baseline serum creatinine appears to be around 1.1. Serum creatinine on admission 1.43. Likely dehydration.  Treated with IV fluid.  Monitoring.  Dementia.  Acute metabolic encephalopathy Currently oriented to place person. Monitor for delirium.   Subjective: No nausea no vomiting no fever no chills.  No chest pain.  Still has some abdominal pain.  Physical Exam: Clear to auscultation. S1-S2 present. Bowel sounds present. Diffusely tender in the suprapubic region. Also pain in the right side of the abdomen. No edema. No focal deficit.  No asterixis.  Data Reviewed: I have Reviewed nursing notes, Vitals, and Lab results. Since last encounter, pertinent lab results CBC and BMP   . I have ordered test including CBC and BMP  . I have discussed pt's care plan and test results with general surgery  .  Ordered echocardiogram.  Disposition: Status is: Inpatient Remains inpatient appropriate because: Monitor for improvement in sepsis physiology.  SCDs Start: 11/27/24 0806   Family Communication: Family at bedside Level of care: Telemetry   Vitals:   11/28/24 0206 11/28/24 0544 11/28/24 0957 11/28/24 1416  BP: (!) 112/58 (!) 137/119 (!) 103/58 108/61  Pulse: 94 63 62 60  Resp:   20 20  Temp: 98.6 F (37 C) 99.6 F (37.6 C) 98.3 F (36.8 C) 98.2 F (36.8 C)  TempSrc:  Oral Oral Oral  SpO2:  100% 98% 100%  Weight:      Height:         Author: Yetta Blanch, MD 11/28/2024 4:57 PM  Please look on [email or web address] to find out who is on call.

## 2024-11-29 ENCOUNTER — Inpatient Hospital Stay (HOSPITAL_COMMUNITY)

## 2024-11-29 DIAGNOSIS — Z950 Presence of cardiac pacemaker: Secondary | ICD-10-CM

## 2024-11-29 DIAGNOSIS — A419 Sepsis, unspecified organism: Secondary | ICD-10-CM | POA: Diagnosis not present

## 2024-11-29 DIAGNOSIS — I2510 Atherosclerotic heart disease of native coronary artery without angina pectoris: Secondary | ICD-10-CM

## 2024-11-29 DIAGNOSIS — I480 Paroxysmal atrial fibrillation: Secondary | ICD-10-CM

## 2024-11-29 DIAGNOSIS — Z01810 Encounter for preprocedural cardiovascular examination: Secondary | ICD-10-CM

## 2024-11-29 LAB — COMPREHENSIVE METABOLIC PANEL WITH GFR
ALT: 81 U/L — ABNORMAL HIGH (ref 0–44)
AST: 56 U/L — ABNORMAL HIGH (ref 15–41)
Albumin: 2.9 g/dL — ABNORMAL LOW (ref 3.5–5.0)
Alkaline Phosphatase: 167 U/L — ABNORMAL HIGH (ref 38–126)
Anion gap: 9 (ref 5–15)
BUN: 16 mg/dL (ref 8–23)
CO2: 24 mmol/L (ref 22–32)
Calcium: 8.2 mg/dL — ABNORMAL LOW (ref 8.9–10.3)
Chloride: 104 mmol/L (ref 98–111)
Creatinine, Ser: 1.05 mg/dL (ref 0.61–1.24)
GFR, Estimated: >60 mL/min
Glucose, Bld: 100 mg/dL — ABNORMAL HIGH (ref 70–99)
Potassium: 3.9 mmol/L (ref 3.5–5.1)
Sodium: 136 mmol/L (ref 135–145)
Total Bilirubin: 0.4 mg/dL (ref 0.0–1.2)
Total Protein: 5.9 g/dL — ABNORMAL LOW (ref 6.5–8.1)

## 2024-11-29 LAB — URINE CULTURE

## 2024-11-29 LAB — CULTURE, BLOOD (ROUTINE X 2)

## 2024-11-29 LAB — CBC WITH DIFFERENTIAL/PLATELET
Abs Immature Granulocytes: 0.01 K/uL (ref 0.00–0.07)
Basophils Absolute: 0 K/uL (ref 0.0–0.1)
Basophils Relative: 0 %
Eosinophils Absolute: 0.1 K/uL (ref 0.0–0.5)
Eosinophils Relative: 3 %
HCT: 34.7 % — ABNORMAL LOW (ref 39.0–52.0)
Hemoglobin: 11.6 g/dL — ABNORMAL LOW (ref 13.0–17.0)
Immature Granulocytes: 0 %
Lymphocytes Relative: 17 %
Lymphs Abs: 0.6 K/uL — ABNORMAL LOW (ref 0.7–4.0)
MCH: 29.6 pg (ref 26.0–34.0)
MCHC: 33.4 g/dL (ref 30.0–36.0)
MCV: 88.5 fL (ref 80.0–100.0)
Monocytes Absolute: 0.6 K/uL (ref 0.1–1.0)
Monocytes Relative: 15 %
Neutro Abs: 2.5 K/uL (ref 1.7–7.7)
Neutrophils Relative %: 65 %
Platelets: 208 K/uL (ref 150–400)
RBC: 3.92 MIL/uL — ABNORMAL LOW (ref 4.22–5.81)
RDW: 14.2 % (ref 11.5–15.5)
WBC: 3.9 K/uL — ABNORMAL LOW (ref 4.0–10.5)
nRBC: 0 % (ref 0.0–0.2)

## 2024-11-29 LAB — GLUCOSE, CAPILLARY
Glucose-Capillary: 103 mg/dL — ABNORMAL HIGH (ref 70–99)
Glucose-Capillary: 113 mg/dL — ABNORMAL HIGH (ref 70–99)
Glucose-Capillary: 124 mg/dL — ABNORMAL HIGH (ref 70–99)
Glucose-Capillary: 90 mg/dL (ref 70–99)

## 2024-11-29 LAB — MAGNESIUM: Magnesium: 2.1 mg/dL (ref 1.7–2.4)

## 2024-11-29 MED ORDER — OXYCODONE HCL 5 MG PO TABS
5.0000 mg | ORAL_TABLET | ORAL | Status: DC | PRN
  Administered 2024-11-30: 5 mg via ORAL
  Filled 2024-11-29: qty 1

## 2024-11-29 MED ORDER — SIMETHICONE 80 MG PO CHEW
80.0000 mg | CHEWABLE_TABLET | Freq: Four times a day (QID) | ORAL | Status: DC
  Administered 2024-11-30 – 2024-12-03 (×12): 80 mg via ORAL
  Filled 2024-11-29 (×13): qty 1

## 2024-11-29 MED ORDER — MORPHINE SULFATE (PF) 4 MG/ML IV SOLN
3.0000 mg | Freq: Once | INTRAVENOUS | Status: AC
  Administered 2024-11-29: 3 mg via INTRAVENOUS

## 2024-11-29 MED ORDER — MORPHINE SULFATE (PF) 2 MG/ML IV SOLN
INTRAVENOUS | Status: AC
  Filled 2024-11-29: qty 2

## 2024-11-29 MED ORDER — TECHNETIUM TC 99M MEBROFENIN IV KIT
5.5000 | PACK | Freq: Once | INTRAVENOUS | Status: AC
  Administered 2024-11-29: 5.5 via INTRAVENOUS

## 2024-11-29 MED ORDER — MORPHINE SULFATE (PF) 2 MG/ML IV SOLN: 2.0000 mg | INTRAVENOUS | Status: DC | PRN

## 2024-11-29 MED ORDER — POLYETHYLENE GLYCOL 3350 17 G PO PACK
17.0000 g | PACK | Freq: Every day | ORAL | Status: DC
  Administered 2024-11-29 – 2024-12-02 (×3): 17 g via ORAL
  Filled 2024-11-29 (×3): qty 1

## 2024-11-29 NOTE — Consult Note (Signed)
 "  Cardiology Consultation   Patient ID: [name] MRN: [id number]; DOB: 22-Aug-1941  Admit date: 11/27/2024 Date of Consult: 11/29/2024  PCP:  Cleotilde Planas, MD   [facility] Providers Cardiologist:  Georganna Archer, MD  Electrophysiologist:  [name], MD       Patient Profile: [name] is a 84 y.o. male with a hx of coronary calcification seen on previous CT imaging in 2015, HTN, HLD, DM2 (not on meds for HTN, HLD or DM), prior DVT, severe dementia, PAF not on OAC due to recurrent epistaxis and CHB s/p Medtronic Micra leadless pacemaker 09/10/2024 who is being seen 11/29/2024 for cardiac clearance at the request of Dr. Tobie.  History of Present Illness: [name] is a 84 year old male with past medical history of coronary calcification seen on previous CT imaging in 2015, HTN, HLD, DM2 (not on meds for HTN, HLD or DM), prior DVT, severe dementia, PAF not on OAC due to recurrent epistaxis and CHB s/p Medtronic Micra leadless pacemaker 09/10/2024. Patient used to see Dr. Victory Sharps, due to elevated coronary calcium score, he underwent stress test in April 2015 that came back low risk with no ischemia.  He was hospitalized with altered mental status in October 2025 and found to have intermittent high degree AV block and a complete heart block prompting EP evaluation.  Initially family refused pacemaker placement, however later decided they wanted to proceed.  He ultimately received Medtronic Micra device by Dr. Primus without complication.  Since Dr. Sharps has retired, he established with Dr. Archer as his primary cardiologist and Dr. Primus as his electrophysiologist.  He has severe baseline dementia. Wife says he is able to babble but often does not make sense.  He was sent to [facility] by his wife on 11/27/2024 due to worsening altered mental status and fever.  On arrival, he was found to have mild AKI with creatinine of 1.43.  He also had elevated  transaminase with AST 667, ALT 322.  Total bilirubin also mildly elevated at 1.3.  White blood cell count 11.4.  Viral panel was negative for influenza, RSV and COVID.  Blood culture came back positive for E. coli.  Chest x-ray showed no acute finding.  EKG shows sinus rhythm with wenckebach.  Abdominal ultrasound showed gallbladder stones but no strong sonographic evidence of acute cholecystitis, no evidence of bile duct obstruction.  CT of abdomen pelvis showed a moderately distended stomach with gas and fluid, no other bowel obstruction or inflammation seen.  Urinalysis showed large leukocyte but negative nitrite or protein.  Patient has been placed on IV antibiotic.  General surgery was consulted due to elevated LFT and T. bili concerning for gallbladder source.  General surgery recommended HIDA scan to evaluate for cholecystitis and cardiac clearance in case patient need to proceed with surgery.  Echocardiogram obtained on 11/28/2024 showed EF 55 to 60%, no regional wall motion abnormality, elevated left atrial pressure, RVSP 39.2 mmHg, trivial MR, mild AI.     Past Medical History:  Diagnosis Date   Arthritis    Attention deficit disorder    Blind left eye    Diabetes mellitus without complication (HCC)    DVT of lower extremity, bilateral (HCC) 04/01/2014   GERD without esophagitis 07/03/2022   Hyperlipidemia    Hypertension     Past Surgical History:  Procedure Laterality Date   EYE SURGERY Left    x3   KNEE ARTHROSCOPY Bilateral 03/29/2014   Procedure: RIGHT  KNEE ARTHROSCOPY AND DEBRIDEMENT, LEFT KNEE ASPIRATION,  LEFT KNEE ARTHROSCOPY AND DEBRIDEMENT.;  Surgeon: Cordella Glendia Hutchinson, MD;  Location: [facility] OR;  Service: Orthopedics;  Laterality: Bilateral;  RIGHT KNEE ARTHROSCOPY AND DEBRIDEMENT, LEFT KNEE ASPIRATION,  LEFT KNEE ARTHROSCOPY AND DEBRIDEMENT.   KNEE ARTHROTOMY Bilateral 04/26/2014   Procedure: KNEE ARTHROTOMY, SYNOVECTOMY, ANTIBIOTIC BEAD PLACEMENT.;  Surgeon: Cordella Glendia Hutchinson,  MD;  Location: [facility] OR;  Service: Orthopedics;  Laterality: Bilateral;   lipoma removal, right upper back     PACEMAKER LEADLESS INSERTION N/A 09/10/2024   Procedure: PACEMAKER LEADLESS INSERTION;  Surgeon: Almetta Donnice LABOR, MD;  Location: [facility] INVASIVE CV LAB;  Service: Cardiovascular;  Laterality: N/A;   TONSILLECTOMY     TOTAL HIP ARTHROPLASTY Left 09/14/2019   TOTAL HIP ARTHROPLASTY Left 09/14/2019   Procedure: LEFT TOTAL HIP ARTHROPLASTY ANTERIOR APPROACH;  Surgeon: Hutchinson Cordella Glendia, MD;  Location: [facility] OR;  Service: Orthopedics;  Laterality: Left;     Home Medications:  Prior to Admission medications  Medication Sig Start Date End Date Taking? Authorizing Provider  aspirin  EC 81 MG tablet Take 1 tablet (81 mg total) by mouth daily. Swallow whole. 06/21/22  Yes Claudene Victory ORN, MD  cyanocobalamin  (VITAMIN B12) 1000 MCG tablet Take 1,000 mcg by mouth daily.   Yes [provider]  donepezil  (ARICEPT ) 5 MG tablet Take 5 mg by mouth at bedtime. 09/11/22  Yes [provider]  magnesium  oxide (MAG-OX) 400 (240 Mg) MG tablet Take 0.5 tablets (200 mg total) by mouth 2 (two) times daily. 07/07/22  Yes Regalado, Belkys A, MD  mirtazapine  (REMERON ) 15 MG tablet Take 15 mg by mouth at bedtime. 05/17/22  Yes [provider]  omeprazole (PRILOSEC) 20 MG capsule Take 20 mg by mouth 2 (two) times daily before a meal. 04/19/22  Yes [provider]  QUEtiapine  (SEROQUEL ) 25 MG tablet Take 1 tablet (25 mg total) by mouth at bedtime. 07/07/22  Yes Regalado, Belkys A, MD    Scheduled Meds:  senna-docusate  2 tablet Oral BID   Continuous Infusions:  cefTRIAXone  (ROCEPHIN )  IV 2 g (11/28/24 2017)   PRN Meds: acetaminophen  **OR** acetaminophen , ketorolac , ondansetron  **OR** ondansetron  (ZOFRAN ) IV  Allergies:   Allergies[1]  Social History:   Social History   Socioeconomic History   Marital status: Married    Spouse name: Not on file   Number of children: Not on file    Years of education: Not on file   Highest education level: Not on file  Occupational History   Not on file  Tobacco Use   Smoking status: Former   Smokeless tobacco: Never  Vaping Use   Vaping status: Never Used  Substance and Sexual Activity   Alcohol use: Yes    Comment: occasional    Drug use: No   Sexual activity: Not on file  Other Topics Concern   Not on file  Social History Narrative   Not on file   Social Drivers of Health   Tobacco Use: Medium Risk (11/27/2024)   Patient History    Smoking Tobacco Use: Former    Smokeless Tobacco Use: Never    Passive Exposure: Not on file  Financial Resource Strain: Low Risk (10/12/2024)   Overall Financial Resource Strain (CARDIA)    Difficulty of Paying Living Expenses: Not very hard  Food Insecurity: No Food Insecurity (10/12/2024)   Epic    Worried About Running Out of Food in the Last Year: Never true    Ran Out of  Food in the Last Year: Never true  Transportation Needs: No Transportation Needs (10/12/2024)   Epic    Lack of Transportation (Medical): No    Lack of Transportation (Non-Medical): No  Physical Activity: Not on file  Stress: No Stress Concern Present (10/12/2024)   [employer] of Occupational Health - Occupational Stress Questionnaire    Feeling of Stress: Not at all  Social Connections: Unknown (11/27/2024)   Social Connection and Isolation Panel    Frequency of Communication with Friends and Family: Patient unable to answer    Frequency of Social Gatherings with Friends and Family: Patient unable to answer    Attends Religious Services: Patient unable to answer    Active Member of Clubs or Organizations: Patient unable to answer    Attends Banker Meetings: Patient unable to answer    Marital Status: Married  Catering Manager Violence: Patient Unable To Answer (09/07/2024)   Epic    Fear of Current or Ex-Partner: Patient unable to answer    Emotionally Abused: Patient unable to  answer    Physically Abused: Patient unable to answer    Sexually Abused: Patient unable to answer  Depression (PHQ2-9): Low Risk (10/12/2024)   Depression (PHQ2-9)    PHQ-2 Score: 0  Alcohol Screen: Not on file  Housing: Low Risk (10/12/2024)   Epic    Unable to Pay for Housing in the Last Year: No    Number of Times Moved in the Last Year: 0    Homeless in the Last Year: No  Utilities: Not At Risk (10/12/2024)   Epic    Threatened with loss of utilities: No  Health Literacy: Not on file    Family History:    Family History  Problem Relation Age of Onset   Heart attack Father    Heart attack Sister    Heart attack Brother      ROS:  Please see the history of present illness.   All other ROS reviewed and negative.     Physical Exam/Data: Vitals:   11/28/24 0957 11/28/24 1416 11/28/24 2124 11/29/24 0523  BP: (!) 103/58 108/61 (!) 112/56 108/74  Pulse: 62 60 77 93  Resp: 20 20 16 18   Temp: 98.3 F (36.8 C) 98.2 F (36.8 C) 98.8 F (37.1 C) 99 F (37.2 C)  TempSrc: Oral Oral Oral Oral  SpO2: 98% 100% 100% 100%  Weight:      Height:        Intake/Output Summary (Last 24 hours) at 11/29/2024 0916 Last data filed at 11/29/2024 0600 Gross per 24 hour  Intake 940.49 ml  Output 750 ml  Net 190.49 ml      11/27/2024    1:52 AM 09/27/2024    8:34 AM 09/11/2024    5:00 AM  Last 3 Weights  Weight (lbs) 150 lb 150 lb 152 lb 12.5 oz  Weight (kg) 68.04 kg 68.04 kg 69.3 kg     Body mass index is 22.81 kg/m.  General:  Well nourished, well developed, in no acute distress HEENT: normal Neck: no JVD Vascular: No carotid bruits; Distal pulses 2+ bilaterally Cardiac:  normal S1, S2; RRR; no murmur  Lungs:  clear to auscultation bilaterally, no wheezing, rhonchi or rales  Abd: soft, nontender, no hepatomegaly  Ext: no edema Musculoskeletal:  No deformities, BUE and BLE strength normal and equal Skin: warm and dry  Neuro: Able to open to question, however unable to  answer any questions. Psych: Unable to assess  EKG:  The EKG was personally reviewed and demonstrates: Sinus rhythm, occasional wenckebach Telemetry:  Telemetry was personally reviewed and demonstrates: Sinus rhythm, ventricularly paced rhythm  Relevant CV Studies:  Echo 11/28/2024     ECHOCARDIOGRAM REPORT     1. Left ventricular ejection fraction, by estimation, is 55 to 60%. The left ventricle has normal function. The left ventricle has no regional wall motion abnormalities. Left ventricular diastolic parameters are consistent with Grade II diastolic dysfunction (pseudonormalization). Elevated left atrial pressure. The E/e' is 18.  2. Right ventricular systolic function is normal. The right ventricular size is normal. There is mildly elevated pulmonary artery systolic pressure. The estimated right ventricular systolic pressure is 39.2 mmHg.  3. The mitral valve is grossly normal. Trivial mitral valve regurgitation. No evidence of mitral stenosis.  4. The aortic valve is grossly normal. Aortic valve regurgitation is mild. Aortic valve sclerosis is present, with no evidence of aortic valve stenosis.  5. The inferior vena cava is normal in size with greater than 50% respiratory variability, suggesting right atrial pressure of 3 mmHg.  Comparison(s): A prior study was performed on 07/02/2022. LVEF 60-65%, normal diastolic dysfunction, moderate LAE, estimated RAP .    Laboratory Data: High Sensitivity Troponin:  No results for input(s): TROPONINIHS in the last 720 hours.   Chemistry Recent Labs  Lab 11/27/24 0229 11/27/24 1029 11/28/24 0330  NA 135 137 136  K 4.5 4.2 4.0  CL 99 103 106  CO2 23 24 22   GLUCOSE 198* 137* 88  BUN 21 20 16   CREATININE 1.43* 1.24 1.11  CALCIUM 9.4 9.0 8.4*  GFRNONAA 49* 58* >60  ANIONGAP 13 10 9     Recent Labs  Lab 11/27/24 0229 11/27/24 1029 11/28/24 0330  PROT 8.1 7.2 6.1*  ALBUMIN 3.9 3.6 2.9*  AST 667* 328* 118*  ALT 322*  253* 138*  ALKPHOS 318* 265* 198*  BILITOT 1.3* 1.1 1.2   Lipids No results for input(s): CHOL, TRIG, HDL, LABVLDL, LDLCALC, CHOLHDL in the last 168 hours.  Hematology Recent Labs  Lab 11/27/24 0229 11/27/24 1029 11/28/24 0330  WBC 11.4* 10.8* 8.0  RBC 4.80 4.34 3.73*  HGB 14.2 13.0 11.1*  HCT 43.1 39.6 33.5*  MCV 89.8 91.2 89.8  MCH 29.6 30.0 29.8  MCHC 32.9 32.8 33.1  RDW 14.1 14.1 14.4  PLT 286 196 201   Thyroid  No results for input(s): TSH, FREET4 in the last 168 hours.  BNPNo results for input(s): BNP, PROBNP in the last 168 hours.  DDimer No results for input(s): DDIMER in the last 168 hours.  Radiology/Studies:  ECHOCARDIOGRAM COMPLETE Result Date: 11/28/2024    ECHOCARDIOGRAM REPORT   Patient Name:   [name] Date of Exam: 11/28/2024 Medical Rec #:  [id number]        Height:       68.0 in Accession #:    [id number]       Weight:       150.0 lb Date of Birth:  1941/04/13         BSA:          1.809 m Patient Age:    83 years         BP:           103/58 mmHg Patient Gender: M                HR:           65 bpm. Exam Location:  Inpatient Procedure:  2D Echo (Both Spectral and Color Flow Doppler were utilized during            procedure). Indications:    pre-operative evaluation  History:        Patient has prior history of Echocardiogram examinations, most                 recent 07/02/2022. Pacemaker, Arrythmias:Paroxysmal atrial                 fibrillation, Signs/Symptoms:Altered Mental Status; Risk                 Factors:Hypertension, Diabetes and Dyslipidemia.  Sonographer:    Tinnie Barefoot RDCS Referring Phys: [id number] [facility] M PATEL  Sonographer Comments: Image acquisition challenging due to uncooperative patient. IMPRESSIONS  1. Left ventricular ejection fraction, by estimation, is 55 to 60%. The left ventricle has normal function. The left ventricle has no regional wall motion abnormalities. Left ventricular diastolic parameters are consistent  with Grade II diastolic dysfunction (pseudonormalization). Elevated left atrial pressure. The E/e' is 18.  2. Right ventricular systolic function is normal. The right ventricular size is normal. There is mildly elevated pulmonary artery systolic pressure. The estimated right ventricular systolic pressure is 39.2 mmHg.  3. The mitral valve is grossly normal. Trivial mitral valve regurgitation. No evidence of mitral stenosis.  4. The aortic valve is grossly normal. Aortic valve regurgitation is mild. Aortic valve sclerosis is present, with no evidence of aortic valve stenosis.  5. The inferior vena cava is normal in size with greater than 50% respiratory variability, suggesting right atrial pressure of 3 mmHg. Comparison(s): A prior study was performed on 07/02/2022. LVEF 60-65%, normal diastolic dysfunction, moderate LAE, estimated RAP . FINDINGS  Left Ventricle: Left ventricular ejection fraction, by estimation, is 55 to 60%. The left ventricle has normal function. The left ventricle has no regional wall motion abnormalities. The left ventricular internal cavity size was small. There is no left ventricular hypertrophy. Left ventricular diastolic parameters are consistent with Grade II diastolic dysfunction (pseudonormalization). Elevated left atrial pressure. The E/e' is 38. Right Ventricle: The right ventricular size is normal. No increase in right ventricular wall thickness. Right ventricular systolic function is normal. There is mildly elevated pulmonary artery systolic pressure. The tricuspid regurgitant velocity is 3.01  m/s, and with an assumed right atrial pressure of 3 mmHg, the estimated right ventricular systolic pressure is 39.2 mmHg. Left Atrium: Left atrial size was normal in size. Right Atrium: Right atrial size was normal in size. Pericardium: Trivial pericardial effusion is present. Mitral Valve: The mitral valve is grossly normal. Trivial mitral valve regurgitation. No evidence of mitral valve  stenosis. Tricuspid Valve: The tricuspid valve is normal in structure. Tricuspid valve regurgitation is mild . No evidence of tricuspid stenosis. Aortic Valve: The aortic valve is grossly normal. Aortic valve regurgitation is mild. Aortic valve sclerosis is present, with no evidence of aortic valve stenosis. Pulmonic Valve: The pulmonic valve was not well visualized. Pulmonic valve regurgitation is not visualized. No evidence of pulmonic stenosis. Aorta: The aortic root is normal in size and structure. Venous: The inferior vena cava is normal in size with greater than 50% respiratory variability, suggesting right atrial pressure of 3 mmHg. IAS/Shunts: The atrial septum is grossly normal.  LEFT VENTRICLE PLAX 2D LVIDd:         3.90 cm     Diastology LVIDs:         2.40 cm     LV e' medial:  6.02 cm/s LV PW:         0.90 cm     LV E/e' medial:  18.8 LV IVS:        0.90 cm     LV e' lateral:   6.67 cm/s LVOT diam:     1.60 cm     LV E/e' lateral: 17.0 LV SV:         39 LV SV Index:   21 LVOT Area:     2.01 cm LV IVRT:       113 msec  LV Volumes (MOD) LV vol d, MOD A2C: 50.3 ml LV vol d, MOD A4C: 54.3 ml LV vol s, MOD A2C: 18.7 ml LV vol s, MOD A4C: 26.1 ml LV SV MOD A2C:     31.6 ml LV SV MOD A4C:     54.3 ml LV SV MOD BP:      31.7 ml RIGHT VENTRICLE             IVC RV Basal diam:  2.60 cm     IVC diam: 1.70 cm RV S prime:     12.00 cm/s TAPSE (M-mode): 2.4 cm LEFT ATRIUM             Index        RIGHT ATRIUM           Index LA diam:        2.60 cm 1.44 cm/m   RA Area:     14.60 cm LA Vol (A2C):   40.6 ml 22.45 ml/m  RA Volume:   36.90 ml  20.40 ml/m LA Vol (A4C):   36.6 ml 20.24 ml/m LA Biplane Vol: 41.9 ml 23.17 ml/m  AORTIC VALVE LVOT Vmax:   93.30 cm/s LVOT Vmean:  60.200 cm/s LVOT VTI:    0.192 m  AORTA Ao Root diam: 3.50 cm MITRAL VALVE                TRICUSPID VALVE MV Area (PHT): 2.80 cm     TV Peak grad:   44.4 mmHg MV Decel Time: 271 msec     TV Vmax:        3.33 m/s MV E velocity: 113.00 cm/s  TR  Peak grad:   36.2 mmHg MV A velocity: 127.00 cm/s  TR Vmax:        301.00 cm/s MV E/A ratio:  0.89                             SHUNTS                             Systemic VTI:  0.19 m                             Systemic Diam: 1.60 cm Sunit Tolia Electronically signed by Madonna Large Signature Date/Time: 11/28/2024/3:34:37 PM    Final    DG Abd Portable 1V Result Date: 11/28/2024 EXAM: 1 VIEW XRAY OF THE ABDOMEN 11/28/2024 08:41:00 AM COMPARISON: None available. CLINICAL HISTORY: Gastric outlet obstruction FINDINGS: BOWEL: Nonobstructive bowel gas pattern. There are several air filled non dilated large and small bowel loops. SOFT TISSUES: Loop recorder over left heart. No abnormal calcifications. BONES: Left total hip arthroplasty partially visualized. Moderate thoracolumbar spondylosis. No acute fracture. IMPRESSION: 1. No acute findings. Electronically signed by: Toribio  Jearld MD MD 11/28/2024 12:29 PM EST RP Workstation: [id number]   CT CHEST ABDOMEN PELVIS W CONTRAST Result Date: 11/27/2024 EXAM: CT CHEST, ABDOMEN AND PELVIS WITH CONTRAST 11/27/2024 04:47:46 AM TECHNIQUE: CT of the chest, abdomen and pelvis was performed with the administration of 100 mL of iohexol  (OMNIPAQUE ) 300 MG/ML solution. Multiplanar reformatted images are provided for review. Automated exposure control, iterative reconstruction, and/or weight based adjustment of the mA/kV was utilized to reduce the radiation dose to as low as reasonably achievable. COMPARISON: CTA chest 05/11/2014, CT abdomen and pelvis 04/12/2022, right upper quadrant ultrasound today reported separately. CLINICAL HISTORY: 84 year old male. Altered mental status, possible sepsis. 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 Superimposed extensive coronary artery calcified atherosclerosis in the chest. Mild to moderate thoracic aortic atherosclerosis. Heart size remains normal.  There is an implantable cardiac device, possibly an internal ICD (series 2 image 40), new from prior exams. No pericardial effusion. No mediastinal, hilar or axillary lymphadenopathy. 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 LUNGS AND PLEURA: Mildly low lung volumes, decreased from previous exams. Symmetric mild dependent atelectasis. Trace retained secretions in the dependent trachea on series 4 image 90 but otherwise major airways are patent. No focal consolidation or pulmonary edema. No pleural effusion. No pneumothorax. ABDOMEN AND PELVIS: LIVER: Liver enhancement within normal limits. GALLBLADDER AND BILE DUCTS: Gallstones better demonstrated by ultrasound today. No convincing pericholecystic inflammation, gallbladder is nondilated. No biliary ductal dilatation. SPLEEN: No acute abnormality. PANCREAS: No acute abnormality. ADRENAL GLANDS: No acute abnormality. KIDNEYS, URETERS AND BLADDER: No stones in the kidneys or ureters. No hydronephrosis. No perinephric or periureteral stranding. Symmetric early renal contrast excretion on delayed images appears normal. Chronic urinary bladder wall thickening and prostatomegaly do not appear significantly changed from 2023; no acute perivesical inflammatory stranding is identified. GI AND BOWEL: The stomach is moderately distended with gas and fluid. The pylorus and duodenum are decompressed. No active gastroduodenal inflammation is identified. No other evidence of bowel obstruction. Mild to moderate large bowel retained stool. Normal gas containing appendix on series 2 image 86. Intermittent mild motion artifact affecting some small and large bowel detail in the abdomen and pelvis. No bowel inflammation identified elsewhere. Nasogastric tube decompression may be valuable. REPRODUCTIVE ORGANS: No acute abnormality. PERITONEUM AND RETROPERITONEUM: No ascites. No free air. VASCULATURE: Aorta is normal in caliber. Moderate to  severe distal abdominal aortic and bilateral iliac artery calcified atherosclerosis. Major arterial structures in the abdomen and pelvis are patent. Major portal venous structures appear patent on the delayed images. ABDOMINAL AND PELVIS LYMPH NODES: No lymphadenopathy. REPRODUCTIVE ORGANS: Chronic prostatomegaly. No acute abnormality. BONES AND SOFT TISSUES: Widespread thoracic spinal hyperostosis with flowing endplate osteophytes resulting in interbody ankylosis at some levels (probably T4 through T9). Chronically advanced lumbar facet arthropathy with bulky facet hypertrophy and chronic vacuum facet. Chronic left hip arthroplasty. Chronic asymmetric right SI joint degeneration. Streak artifact in the deep pelvis from hip arthroplasty. No acute osseous abnormality. No focal soft tissue abnormality. IMPRESSION: 1. Moderately distended stomach with gas and fluid. No gastroduodenal inflammation and no other bowel obstruction. Nasogastric decompression may be valuable. 2. No other acute or inflammatory process identified in the chest, abdomen, or pelvis. 3. Advanced calcified atherosclerosis. Mild atelectasis. Electronically signed by: Helayne Hurst MD MD 11/27/2024 05:33 AM EST RP Workstation: [id number]   US  Abdomen Limited RUQ (LIVER/GB) Result Date: 11/27/2024 EXAM: Right Upper Quadrant Abdominal Ultrasound  11/27/2024 04:11:23 AM TECHNIQUE: Real-time ultrasonography of the right upper quadrant of the abdomen was performed. COMPARISON: CT abdomen and pelvis 04/12/2022. CLINICAL HISTORY: 83 year old male. Biliary obstruction (hepatocellular carcinoma), altered mental status. FINDINGS: LIVER: Echogenic liver (image 9). Hepatic steatosis. Hepatopetal flow in the portal vein. No intrahepatic biliary ductal dilatation. No evidence of mass. BILIARY SYSTEM: Gallbladder wall-echo-shadow sign (WES sign) indicating a gallbladder contracted around numerous stones. No pericholecystic fluid or wall thickening. Common bile duct  measures 4 mm and is within normal limits. No sonographic Beverley sign is elicited. RIGHT KIDNEY: Right kidney is partially visible, negative. OTHER: No right upper quadrant ascites. IMPRESSION: 1. Gallbladder contracted around numerous stones, (WES sign). Wall measures 4 mm, but no strong sonographic evidence of acute cholecystitis. 2. No evidence of bile duct obstruction. 3. Hepatic steatosis. Electronically signed by: Helayne Hurst MD MD 11/27/2024 05:22 AM EST RP Workstation: [id number]   DG Chest Port 1 View if patient is in a treatment room. Result Date: 11/27/2024 EXAM: 1 VIEW(S) XRAY OF THE CHEST 11/27/2024 02:07:00 AM COMPARISON: 09/11/2024 CLINICAL HISTORY: Suspected Sepsis FINDINGS: LINES, TUBES AND DEVICES: Cardiac loop recorder device stable in place. LUNGS AND PLEURA: No focal pulmonary opacity. No pleural effusion. No pneumothorax. HEART AND MEDIASTINUM: No acute abnormality of the cardiac and mediastinal silhouettes. BONES AND SOFT TISSUES: Thoracic spondylosis and bilateral glenohumeral osteoarthritis. IMPRESSION: 1. No acute cardiopulmonary abnormality. Electronically signed by: Morgane Naveau MD MD 11/27/2024 02:10 AM EST RP Workstation: [id number]     Assessment and Plan: Preoperative clearance  - Requested by general surgery team and the hospitalist service in case patient need to proceed with gallbladder surgery.  Pending HIDA scan before deciding whether or not to proceed with surgery.  - Patient unable to supply any answer due to severe dementia.  Wife has not recalled any complaint of chest pain recently.  Echocardiogram showed normal EF without wall motion abnormality.  Patient previously was found to have coronary calcification on previous CT image in 2015, subsequent Myoview was negative.  - He appears to be euvolemic on exam.  Patient is optimized to proceed with surgery if needed.  He has extremely bad dementia, therefore unable to a supply any answers to my question.  However I  am not confident Myoview will necessarily lower his cardiovascular surgical risk either.  Will discuss with MD.  E. coli sepsis bacteremia: Sepsis managed by primary team.  On IV antibiotic.  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  Not on anticoagulation due to history of recurrent significant epistaxis.  Hypertension: Despite having a prior history of hypertension, patient is not on any blood pressure medication at home.  Hyperlipidemia: Not on any cholesterol medication at home.  DM2: Not on diabetes medication at home either.  Severe dementia   Risk Assessment/Risk Scores:         CHA2DS2-VASc Score = 4   This indicates a 4.8% annual risk of stroke. The patient's score is based upon: CHF History: 0 HTN History: 1 Diabetes History: 1 Stroke History: 0 Vascular Disease History: 0 Age Score: 2 Gender Score: 0        For questions or updates, please contact [facility] Please consult [email or web address] for contact info under    Bonney Scot Ford, [place]  11/29/2024 9:16 AM     [1] No Known Allergies  "

## 2024-11-29 NOTE — Progress Notes (Signed)
 "      Subjective: Patient currently is nonverbal which his wife states is something he does from time to time with his dementia.  Unsure if he is having pain at this moment.  Just got back from HIDA  ROS: See above, otherwise other systems negative  Objective: Vital signs in last 24 hours: Temp:  [98.2 F (36.8 C)-99 F (37.2 C)] 99 F (37.2 C) (01/12 0523) Pulse Rate:  [60-93] 93 (01/12 0523) Resp:  [16-20] 18 (01/12 0523) BP: (108-112)/(56-74) 108/74 (01/12 0523) SpO2:  [100 %] 100 % (01/12 0523) Last BM Date : 11/28/24  Intake/Output from previous day: 01/11 0701 - 01/12 0700 In: 940.5 [I.V.:840.5; IV Piggyback:100] Out: 750 [Urine:750] Intake/Output this shift: Total I/O In: 105.3 [I.V.:105.3] Out: 280 [Urine:280]  PE: Gen: NAD Abd: soft, NT, ND, doesn't wince or guard to palpation at all on exam currently  Lab Results:  Recent Labs    11/28/24 0330 11/29/24 0919  WBC 8.0 3.9*  HGB 11.1* 11.6*  HCT 33.5* 34.7*  PLT 201 208   BMET Recent Labs    11/28/24 0330 11/29/24 0919  NA 136 136  K 4.0 3.9  CL 106 104  CO2 22 24  GLUCOSE 88 100*  BUN 16 16  CREATININE 1.11 1.05  CALCIUM 8.4* 8.2*   PT/INR Recent Labs    11/27/24 0229  LABPROT 14.4  INR 1.1   CMP     Component Value Date/Time   NA 136 11/29/2024 0919   NA 139 06/13/2022 1103   K 3.9 11/29/2024 0919   CL 104 11/29/2024 0919   CO2 24 11/29/2024 0919   GLUCOSE 100 (H) 11/29/2024 0919   BUN 16 11/29/2024 0919   BUN 13 06/13/2022 1103   CREATININE 1.05 11/29/2024 0919   CALCIUM 8.2 (L) 11/29/2024 0919   PROT 5.9 (L) 11/29/2024 0919   PROT 7.1 10/11/2019 1206   ALBUMIN 2.9 (L) 11/29/2024 0919   ALBUMIN 4.0 10/11/2019 1206   AST 56 (H) 11/29/2024 0919   ALT 81 (H) 11/29/2024 0919   ALKPHOS 167 (H) 11/29/2024 0919   BILITOT 0.4 11/29/2024 0919   BILITOT 0.3 10/11/2019 1206   GFRNONAA >60 11/29/2024 0919   GFRAA >60 10/14/2019 0012   Lipase     Component Value Date/Time    LIPASE 23 09/14/2019 1849       Studies/Results: NM Hepatobiliary Liver Func Result Date: 11/29/2024 EXAM: NM HEPATOBILLARY SCAN 11/29/2024 11:26:21 AM TECHNIQUE: RADIOPHARMACEUTICAL: 5.5 mCi Tc-2m mebrofenin  Dynamic images of the abdomen and pelvis were obtained in the anterior projection for 1 hour after intravenous administration of radiopharmaceutical. Morphine  was administered intravenously and imaging continued for at least 30 minutes. COMPARISON: CT and ultrasound 11/27/2024. CLINICAL HISTORY: Cholecystitis. FINDINGS: Homogenous uptake within the liver. Normal clearance of the blood pool. Appropriate excretion into the biliary system. Activity is visualized in the small bowel. No activity is visualized in the gallbladder at 45 minutes. Following intravenous administration of 3 mg morphine , the gallbladder fills. No evidence of acute cholecystitis. IMPRESSION: 1. No evidence of acute cholecystitis. 2. Patent cystic duct and common bile duct. Electronically signed by: Norleen Boxer MD MD 11/29/2024 11:39 AM EST RP Workstation: [id number]   ECHOCARDIOGRAM COMPLETE Result Date: 11/28/2024    ECHOCARDIOGRAM REPORT   Patient Name:   ARTIN MCEUEN Date of Exam: 11/28/2024 Medical Rec #:  [id number]        Height:       68.0 in Accession #:    [id number]  Weight:       150.0 lb Date of Birth:  [birth year]         BSA:          1.809 m Patient Age:    83 years         BP:           103/58 mmHg Patient Gender: M                HR:           65 bpm. Exam Location:  Inpatient Procedure: 2D Echo (Both Spectral and Color Flow Doppler were utilized during            procedure). Indications:    pre-operative evaluation  History:        Patient has prior history of Echocardiogram examinations, most                 recent 07/02/2022. Pacemaker, Arrythmias:Paroxysmal atrial                 fibrillation, Signs/Symptoms:Altered Mental Status; Risk                 Factors:Hypertension, Diabetes and Dyslipidemia.   Sonographer:    Tinnie Barefoot RDCS Referring Phys: [id number] [facility] M PATEL  Sonographer Comments: Image acquisition challenging due to uncooperative patient. IMPRESSIONS  1. Left ventricular ejection fraction, by estimation, is 55 to 60%. The left ventricle has normal function. The left ventricle has no regional wall motion abnormalities. Left ventricular diastolic parameters are consistent with Grade II diastolic dysfunction (pseudonormalization). Elevated left atrial pressure. The E/e' is 18.  2. Right ventricular systolic function is normal. The right ventricular size is normal. There is mildly elevated pulmonary artery systolic pressure. The estimated right ventricular systolic pressure is 39.2 mmHg.  3. The mitral valve is grossly normal. Trivial mitral valve regurgitation. No evidence of mitral stenosis.  4. The aortic valve is grossly normal. Aortic valve regurgitation is mild. Aortic valve sclerosis is present, with no evidence of aortic valve stenosis.  5. The inferior vena cava is normal in size with greater than 50% respiratory variability, suggesting right atrial pressure of 3 mmHg. Comparison(s): A prior study was performed on 07/02/2022. LVEF 60-65%, normal diastolic dysfunction, moderate LAE, estimated RAP . FINDINGS  Left Ventricle: Left ventricular ejection fraction, by estimation, is 55 to 60%. The left ventricle has normal function. The left ventricle has no regional wall motion abnormalities. The left ventricular internal cavity size was small. There is no left ventricular hypertrophy. Left ventricular diastolic parameters are consistent with Grade II diastolic dysfunction (pseudonormalization). Elevated left atrial pressure. The E/e' is 93. Right Ventricle: The right ventricular size is normal. No increase in right ventricular wall thickness. Right ventricular systolic function is normal. There is mildly elevated pulmonary artery systolic pressure. The tricuspid regurgitant velocity is  3.01  m/s, and with an assumed right atrial pressure of 3 mmHg, the estimated right ventricular systolic pressure is 39.2 mmHg. Left Atrium: Left atrial size was normal in size. Right Atrium: Right atrial size was normal in size. Pericardium: Trivial pericardial effusion is present. Mitral Valve: The mitral valve is grossly normal. Trivial mitral valve regurgitation. No evidence of mitral valve stenosis. Tricuspid Valve: The tricuspid valve is normal in structure. Tricuspid valve regurgitation is mild . No evidence of tricuspid stenosis. Aortic Valve: The aortic valve is grossly normal. Aortic valve regurgitation is mild. Aortic valve sclerosis is present, with no evidence of aortic valve stenosis.  Pulmonic Valve: The pulmonic valve was not well visualized. Pulmonic valve regurgitation is not visualized. No evidence of pulmonic stenosis. Aorta: The aortic root is normal in size and structure. Venous: The inferior vena cava is normal in size with greater than 50% respiratory variability, suggesting right atrial pressure of 3 mmHg. IAS/Shunts: The atrial septum is grossly normal.  LEFT VENTRICLE PLAX 2D LVIDd:         3.90 cm     Diastology LVIDs:         2.40 cm     LV e' medial:    6.02 cm/s LV PW:         0.90 cm     LV E/e' medial:  18.8 LV IVS:        0.90 cm     LV e' lateral:   6.67 cm/s LVOT diam:     1.60 cm     LV E/e' lateral: 17.0 LV SV:         39 LV SV Index:   21 LVOT Area:     2.01 cm LV IVRT:       113 msec  LV Volumes (MOD) LV vol d, MOD A2C: 50.3 ml LV vol d, MOD A4C: 54.3 ml LV vol s, MOD A2C: 18.7 ml LV vol s, MOD A4C: 26.1 ml LV SV MOD A2C:     31.6 ml LV SV MOD A4C:     54.3 ml LV SV MOD BP:      31.7 ml RIGHT VENTRICLE             IVC RV Basal diam:  2.60 cm     IVC diam: 1.70 cm RV S prime:     12.00 cm/s TAPSE (M-mode): 2.4 cm LEFT ATRIUM             Index        RIGHT ATRIUM           Index LA diam:        2.60 cm 1.44 cm/m   RA Area:     14.60 cm LA Vol (A2C):   40.6 ml 22.45 ml/m  RA  Volume:   36.90 ml  20.40 ml/m LA Vol (A4C):   36.6 ml 20.24 ml/m LA Biplane Vol: 41.9 ml 23.17 ml/m  AORTIC VALVE LVOT Vmax:   93.30 cm/s LVOT Vmean:  60.200 cm/s LVOT VTI:    0.192 m  AORTA Ao Root diam: 3.50 cm MITRAL VALVE                TRICUSPID VALVE MV Area (PHT): 2.80 cm     TV Peak grad:   44.4 mmHg MV Decel Time: 271 msec     TV Vmax:        3.33 m/s MV E velocity: 113.00 cm/s  TR Peak grad:   36.2 mmHg MV A velocity: 127.00 cm/s  TR Vmax:        301.00 cm/s MV E/A ratio:  0.89                             SHUNTS                             Systemic VTI:  0.19 m  Systemic Diam: 1.60 cm Sunit Tolia Electronically signed by Madonna Large Signature Date/Time: 11/28/2024/3:34:37 PM    Final    DG Abd Portable 1V Result Date: 11/28/2024 EXAM: 1 VIEW XRAY OF THE ABDOMEN 11/28/2024 08:41:00 AM COMPARISON: None available. CLINICAL HISTORY: Gastric outlet obstruction FINDINGS: BOWEL: Nonobstructive bowel gas pattern. There are several air filled non dilated large and small bowel loops. SOFT TISSUES: Loop recorder over left heart. No abnormal calcifications. BONES: Left total hip arthroplasty partially visualized. Moderate thoracolumbar spondylosis. No acute fracture. IMPRESSION: 1. No acute findings. Electronically signed by: Toribio Agreste MD MD 11/28/2024 12:29 PM EST RP Workstation: [id number]    Anti-infectives: Anti-infectives (From admission, onward)    Start     Dose/Rate Route Frequency Ordered Stop   11/27/24 2200  cefTRIAXone  (ROCEPHIN ) 2 g in sodium chloride  0.9 % 100 mL IVPB        2 g 200 mL/hr over 30 Minutes Intravenous Every 24 hours 11/27/24 1522     11/27/24 1430  metroNIDAZOLE  (FLAGYL ) IVPB 500 mg  Status:  Discontinued        500 mg 100 mL/hr over 60 Minutes Intravenous Every 12 hours 11/27/24 0810 11/27/24 1525   11/27/24 1400  vancomycin  (VANCOREADY) IVPB 750 mg/150 mL  Status:  Discontinued        750 mg 150 mL/hr over 60 Minutes Intravenous Daily  11/27/24 0817 11/27/24 1521   11/27/24 1130  ceFEPIme  (MAXIPIME ) 2 g in sodium chloride  0.9 % 100 mL IVPB  Status:  Discontinued        2 g 200 mL/hr over 30 Minutes Intravenous Every 12 hours 11/27/24 0810 11/27/24 1521   11/27/24 0330  ceFEPIme  (MAXIPIME ) 2 g in sodium chloride  0.9 % 100 mL IVPB        2 g 200 mL/hr over 30 Minutes Intravenous  Once 11/27/24 0317 11/27/24 0417   11/27/24 0330  metroNIDAZOLE  (FLAGYL ) IVPB 500 mg        500 mg 100 mL/hr over 60 Minutes Intravenous  Once 11/27/24 0317 11/27/24 0502   11/27/24 0330  vancomycin  (VANCOCIN ) IVPB 1000 mg/200 mL premix        1,000 mg 200 mL/hr over 60 Minutes Intravenous  Once 11/27/24 0317 11/27/24 0455        Assessment/Plan Cholelithiasis, elevated AST/ALT/AP with abdominal pain -HIDA scan with no evidence of acute cholecystitis currently -it is possible he may have biliary colic from time to time or may  have passed a stone given elevated in LFTs, but this are down trending.   -patient is currently nontender. -given normal HIDA and normal WBC, will not plan to move forward with lap chole at this time.  Will allow for CLD -given his dementia, would try to avoid surgery moving forward for this patient, but at least for right now with no evidence of an acute infection. -d/w primary service and wife at bedside.  FEN - CLD VTE - SCDs ID - Rocephin   I reviewed hospitalist notes, last 24 h vitals and pain scores, last 48 h intake and output, last 24 h labs and trends, and last 24 h imaging results.   LOS: 2 days    Burnard FORBES Banter , [facility] Surgery 11/29/2024, 12:08 PM Please see Amion for pager number during day hours 7:00am-4:30pm or 7:00am -11:30am on weekends  "

## 2024-11-29 NOTE — Progress Notes (Signed)
 OT Cancellation Note  Patient Details Name: LIONARDO HAZE MRN: [id number] DOB: Dec 01, 1940   Cancelled Treatment:    Reason Eval/Treat Not Completed: Fatigue/lethargy limiting ability to participate Pt received meds that resulted in pt persistent sleepiness. Did not awaken on OT entry. Discussed with family, OT plan to follow up tomorrow to assess OOB ADL when hopefully more alert.  Mliss Fish 11/29/2024, 1:04 PM

## 2024-11-29 NOTE — Plan of Care (Signed)
  Problem: Coping: Goal: Level of anxiety will decrease Outcome: Progressing   Problem: Pain Managment: Goal: General experience of comfort will improve and/or be controlled Outcome: Progressing   Problem: Elimination: Goal: Will not experience complications related to bowel motility Outcome: Progressing Goal: Will not experience complications related to urinary retention Outcome: Progressing   Problem: Safety: Goal: Ability to remain free from injury will improve Outcome: Progressing

## 2024-11-29 NOTE — Evaluation (Addendum)
 Physical Therapy Evaluation Patient Details Name: [name] MRN: [id number] DOB: 01-18-1941 Today's Date: 11/29/2024  History of Present Illness  84 yo male presented to the hospital with complaints of confusion and fatigue.  Currently found to have a E. coli bacteremia likely in the setting of acute cholecystitis. HIDA scan negative, per surgery likely not choleycystitis.   PMH of dementia, left eye blindness, T2DM, HLD, HTN, DVT and A-fib not on anticoagulation, CHB, PPM implant, failure to thrive  Clinical Impression  Pt admitted with above diagnosis.  Pt sleepy on arrival, aroused with multi-modal stimuli. Pt typically is ambulatory without device and requires assist with ADLs/iADLs. Pt wife and dtr report they  assist. Today pt is requiring +2 max-total assist with bed mobility and STS transfers.  Incontinent of stool therefore deferred attempt to transfer to chair, NT made aware. Pt family present and supportive,  dtr assisted with partial ADL during PT session.  Patient will benefit from continued inpatient follow up therapy, <3 hours/day   Pt currently with functional limitations due to the deficits listed below (see PT Problem List). Pt will benefit from acute skilled PT to increase their independence and safety with mobility to allow discharge.           If plan is discharge home, recommend the following: Two people to help with walking and/or transfers;Two people to help with bathing/dressing/bathroom;Help with stairs or ramp for entrance;Assist for transportation;Assistance with cooking/housework;Direct supervision/assist for medications management;Direct supervision/assist for financial management;Supervision due to cognitive status   Can travel by private vehicle   No    Equipment Recommendations None recommended by PT  Recommendations for Other Services       Functional Status Assessment Patient has had a recent decline in their functional status and demonstrates the  ability to make significant improvements in function in a reasonable and predictable amount of time.     Precautions / Restrictions Precautions Precautions: Fall Restrictions Weight Bearing Restrictions Per Provider Order: No      Mobility  Bed Mobility Overal bed mobility: Needs Assistance Bed Mobility: Supine to Sit, Sit to Supine     Supine to sit: Max assist, Total assist, +2 for physical assistance, +2 for safety/equipment Sit to supine: Max assist, +2 for physical assistance, Total assist, +2 for safety/equipment   General bed mobility comments: assist for trunk and LEs.    Transfers Overall transfer level: Needs assistance Equipment used: 2 person hand held assist Transfers: Sit to/from Stand Sit to Stand: Max assist, Mod assist, +2 physical assistance, +2 safety/equipment, From elevated surface           General transfer comment: STS from EOB. pt requiring assist to maintain knee flexion and keet feet in contact with floor, assist for anterior superior wt translation. pt unable to maintain standing. posterior bias    Ambulation/Gait               General Gait Details: unable/NT  Stairs            Wheelchair Mobility     Tilt Bed    Modified Rankin (Stroke Patients Only)       Balance Overall balance assessment: Needs assistance Sitting-balance support: Feet supported, Feet unsupported, No upper extremity supported Sitting balance-Leahy Scale: Poor Sitting balance - Comments: able to maintain static sitting for brief periods with CGA-close supervision. otherwise min assist to maintain midline Postural control: Posterior lean Standing balance support: Bilateral upper extremity supported Standing balance-Leahy Scale: Zero  Pertinent Vitals/Pain Pain Assessment Pain Assessment: Faces Faces Pain Scale: Hurts little more Pain Location: pt grimaces wit movement as if diffuse/joint pain but says no  when asked about pain Pain Descriptors / Indicators: Grimacing Pain Intervention(s): Monitored during session, Repositioned    Home Living Family/patient expects to be discharged to:: Private residence Living Arrangements: Spouse/significant other Available Help at Discharge: Available 24 hours/day;Family Type of Home: House Home Access: Stairs to enter Entrance Stairs-Rails: None Entrance Stairs-Number of Steps: 1   Home Layout: One level Home Equipment: Cane - single point;Hand held Programmer, Systems (2 wheels);Wheelchair - manual;Shower seat      Prior Function Prior Level of Function : Needs assist  Cognitive Assist : Mobility (cognitive);ADLs (cognitive) Mobility (Cognitive): Set up cues         Mobility Comments: per pt's wife  pt sometimes uses SPC but does not need it, does not typically amb with it ADLs Comments: per pt's wife and dtr they  assist with bathing,  dressing, in and out of tub shower, bathing, meals etc.     Extremity/Trunk Assessment   Upper Extremity Assessment Upper Extremity Assessment: Defer to OT evaluation;Generalized weakness    Lower Extremity Assessment Lower Extremity Assessment: Generalized weakness    Cervical / Trunk Assessment Cervical / Trunk Assessment: Normal  Communication   Communication Communication: Impaired Factors Affecting Communication: Hearing impaired;Reduced clarity of speech    Cognition Arousal: Alert Behavior During Therapy: WFL for tasks assessed/performed   PT - Cognitive impairments: History of cognitive impairments                         Following commands: Impaired Following commands impaired: Follows one step commands inconsistently     Cueing Cueing Techniques: Verbal cues, Gestural cues, Tactile cues     General Comments      Exercises     Assessment/Plan    PT Assessment Patient needs continued PT services  PT Problem List Decreased strength;Decreased activity  tolerance;Decreased balance;Decreased knowledge of use of DME;Pain;Decreased mobility;Decreased cognition;Decreased safety awareness       PT Treatment Interventions DME instruction;Therapeutic exercise;Functional mobility training;Therapeutic activities;Patient/family education;Gait training;Balance training    PT Goals (Current goals can be found in the Care Plan section)  Acute Rehab PT Goals Patient Stated Goal: get stronger PT Goal Formulation: With family Time For Goal Achievement: 12/13/24 Potential to Achieve Goals: Fair    Frequency Min 3X/week     Co-evaluation               AM-PAC PT 6 Clicks Mobility  Outcome Measure Help needed turning from your back to your side while in a flat bed without using bedrails?: Total Help needed moving from lying on your back to sitting on the side of a flat bed without using bedrails?: Total Help needed moving to and from a bed to a chair (including a wheelchair)?: Total Help needed standing up from a chair using your arms (e.g., wheelchair or bedside chair)?: Total Help needed to walk in hospital room?: Total Help needed climbing 3-5 steps with a railing? : Total 6 Click Score: 6    End of Session Equipment Utilized During Treatment: Gait belt Activity Tolerance: Patient limited by fatigue;Other (comment) (cognition) Patient left: in bed;with call bell/phone within reach;with bed alarm set;with family/visitor present Nurse Communication: Mobility status;Other (comment) (pt incontinent of stool, NT made aware) PT Visit Diagnosis: Other abnormalities of gait and mobility (R26.89)    Time: 8861-8849 PT  Time Calculation (min) (ACUTE ONLY): 12 min   Charges:   PT Evaluation $PT Eval Low Complexity: 1 Low   PT General Charges $$ ACUTE PT VISIT: 1 Visit         Asmi Fugere, PT  Acute Rehab Dept [facility]) [phone]  11/29/2024   [facility] 11/29/2024, 12:17 PM

## 2024-11-29 NOTE — TOC Initial Note (Signed)
 Transition of Care [facility]) - Initial/Assessment Note    Patient Details  Name: [name] MRN: [id number] Date of Birth: 03-Apr-1941  Transition of Care [facility]) CM/SW Contact:    Alfonse JONELLE Rex, RN Phone Number: 11/29/2024, 3:56 PM  Clinical Narrative:   Admitted from home , presented to ED , wife reports subjective fevers as he was hot to the touch, coughing and found him babbling as getting ready for bed. Has PCP and insurance on file. PT eval, await final recommendation. CM will continue to follow.                     Patient Goals and CMS Choice            Expected Discharge Plan and Services                                              Prior Living Arrangements/Services                       Activities of Daily Living   ADL Screening (condition at time of admission) Independently performs ADLs?: Yes (appropriate for developmental age) Is the patient deaf or have difficulty hearing?: No Does the patient have difficulty seeing, even when wearing glasses/contacts?: Yes Does the patient have difficulty concentrating, remembering, or making decisions?: Yes  Permission Sought/Granted                  Emotional Assessment              Admission diagnosis:  Somnolence [R40.0] Transaminitis [R74.01] Fever in adult [R50.9] Sepsis due to undetermined organism [facility]) [A41.9] Patient Active Problem List   Diagnosis Date Noted   Preoperative cardiovascular examination 11/29/2024   Pacemaker 11/29/2024   Coronary artery disease involving native coronary artery of native heart without angina pectoris 11/29/2024   Sepsis due to undetermined organism (HCC) 11/27/2024   Transaminitis 11/27/2024   AMS (altered mental status) 09/07/2024   Counseling regarding advance care planning and goals of care    Acute cystitis without hematuria    Heart block 07/04/2022   Heart block AV complete (HCC) 07/03/2022   Mixed diabetic hyperlipidemia  associated with type 2 diabetes mellitus (HCC) 07/03/2022   GERD without esophagitis 07/03/2022   Second degree AV block, Mobitz type I 05/24/2020   Sepsis secondary to UTI (HCC) 10/12/2019   AKI (acute kidney injury) 10/12/2019   PAF (paroxysmal atrial fibrillation) (HCC) 10/12/2019   Acute metabolic encephalopathy 10/12/2019   PAC (premature atrial contraction)    Blocked premature atrial contraction    Acute urinary retention 09/14/2019   Protein-calorie malnutrition, severe 04/28/2014   Acute blood loss anemia 04/28/2014   Septic joint (HCC) 04/25/2014   DVT of lower extremity, bilateral (HCC) 04/01/2014   Bacterial infection of knee joint (HCC) 03/29/2014   Essential hypertension 03/01/2014   Chest pain 03/01/2014   Type 2 diabetes mellitus without complication, without long-term current use of insulin  (HCC) 03/01/2014   Hyperlipidemia 03/01/2014   PCP:  Cleotilde Planas, MD Pharmacy:   [facility] [id number] - [address] [place] - [address] [place] [address] Phone: [phone] Fax: [phone]  [facility] - [place], [place] - [address] [facility] to [facility] [address] [place] [address]  Phone: [phone] Fax: [phone]     Social Drivers of Health (SDOH) Social History: SDOH Screenings   Food Insecurity: No Food Insecurity (10/12/2024)  Housing: Low Risk (10/12/2024)  Transportation Needs: No Transportation Needs (10/12/2024)  Utilities: Not At Risk (10/12/2024)  Depression (PHQ2-9): Low Risk (10/12/2024)  Financial Resource Strain: Low Risk (10/12/2024)  Social Connections: Unknown (11/27/2024)  Stress: No Stress Concern Present (10/12/2024)  Tobacco Use: Medium Risk (11/27/2024)   SDOH Interventions:     Readmission Risk Interventions    07/07/2022    1:58 PM  Readmission Risk Prevention Plan  Post Dischage Appt Complete  Medication Screening  Complete  Transportation Screening Complete

## 2024-11-29 NOTE — Progress Notes (Signed)
 [facility] Progress Note Patient: [name] FMW:[id number] DOB: [birth year]  DOA: 11/27/2024 DOS: the patient was seen and examined on 11/29/2024  Brief Hospital Course: Patient with PMH of dementia, left eye blindness, T2DM, HLD, HTN, DVT and A-fib not on anticoagulation, CHB SP PPM implant, failure to thrive presented to the hospital with complaints of confusion and fatigue. Currently found to have a E. coli bacteremia likely in the setting of acute cholecystitis.  Assessment and Plan: E. coli bacteremia. Likely in the setting of possible acute cholecystitis with sepsis present on admission. Acute metabolic encephalopathy. LFT elevation. Presented with poor p.o. intake and fatigue. Workup on admission shows AST ALT 667/322 with total bilirubin 1.3.  Serum creatinine 1.43.  Leukocytosis 11.4, Tmax 102.1.  Meeting SIRS criteria. Ultrasound abdomen shows gallstones without any evidence of acute cholecystitis. CBD 4 mm. CT abdomen also did not show any evidence of acute abnormality other than distended stomach. Patient was treated with IV antibiotic and IV fluid. LFT shows improvement. Blood cultures came back positive for E. coli. Sensitivity currently pending. Fever improved sepsis physiology resolved. General surgery consulted.  HIDA scan recommended.  Negative for any cholecystitis. Cardiology was consulted for preop p.o., patient is cleared for cardiology point of view. At present given negative HIDA scan, surgery thinks that the risk of gallbladder surgery is significantly high compared to the outcome and therefore recommend conservative measures.  Gastric distention. Seen on CT scan upon admission although on repeat x-ray it appears to have resolved. Patient did not have any nausea or vomiting. For now monitor.  Severe constipation. Continue aggressive bowel regimen. Monitor.  History of paroxysmal A-fib, CHB SP PPM implant. Not on any anticoagulation due to  bleeding. Rate is controlled. Has a pacemaker. Cardiology consult preop clearance.  T2DM without any complication without long-term insulin  use. Currently n.p.o. and sliding-scale insulin . Advancing to clear liquid diet. Monitor. A1c was 5.7.  GERD. Continue PPI.  HTN. Blood pressure stable. Currently holding medication.  HLD. Not on any medication. Monitor.  AKI. Baseline serum creatinine appears to be around 1.1. Serum creatinine on admission 1.43. Likely dehydration.  Treated with IV fluid.  Monitoring.  Dementia.  Acute metabolic encephalopathy Currently oriented to place person. Monitor for delirium.   Subjective: No nausea or vomiting.  Patient was seen after receiving 3 mg of IV morphine  for HIDA scan and therefore denies any acute pain.  Was actually drowsy.  Physical Exam: Initially had some upper airway crackles, which improved with cough and later on it was clear to auscultation. Bowel sound present. Nontender. No edema. Drowsy but arousable, occasionally follows command.  Spontaneously moves all extremities.  Data Reviewed: I have Reviewed nursing notes, Vitals, and Lab results. Since last encounter, pertinent lab results CBC and BMP   . I have ordered test including CBC CMP   . I have discussed pt's care plan and test results with general surgery  .   Disposition: Status is: Inpatient Remains inpatient appropriate because: Monitor for improvement in pain control and mentation.  SCDs Start: 11/27/24 0806   Family Communication: Family at bedside Level of care: Telemetry   Vitals:   11/28/24 1416 11/28/24 2124 11/29/24 0523 11/29/24 1502  BP: 108/61 (!) 112/56 108/74 120/60  Pulse: 60 77 93 (!) 55  Resp: 20 16 18 16   Temp: 98.2 F (36.8 C) 98.8 F (37.1 C) 99 F (37.2 C) 98.3 F (36.8 C)  TempSrc: Oral Oral Oral Oral  SpO2: 100% 100% 100% 100%  Weight:  Height:         Author: Yetta Blanch, MD 11/29/2024 6:09 PM  Please look on  [email or web address] to find out who is on call.

## 2024-11-30 DIAGNOSIS — Z01810 Encounter for preprocedural cardiovascular examination: Secondary | ICD-10-CM

## 2024-11-30 LAB — CBC WITH DIFFERENTIAL/PLATELET
Abs Immature Granulocytes: 0.02 K/uL (ref 0.00–0.07)
Basophils Absolute: 0 K/uL (ref 0.0–0.1)
Basophils Relative: 0 %
Eosinophils Absolute: 0.2 K/uL (ref 0.0–0.5)
Eosinophils Relative: 3 %
HCT: 35.1 % — ABNORMAL LOW (ref 39.0–52.0)
Hemoglobin: 11.6 g/dL — ABNORMAL LOW (ref 13.0–17.0)
Immature Granulocytes: 0 %
Lymphocytes Relative: 21 %
Lymphs Abs: 1.1 K/uL (ref 0.7–4.0)
MCH: 29.6 pg (ref 26.0–34.0)
MCHC: 33 g/dL (ref 30.0–36.0)
MCV: 89.5 fL (ref 80.0–100.0)
Monocytes Absolute: 1 K/uL (ref 0.1–1.0)
Monocytes Relative: 20 %
Neutro Abs: 2.8 K/uL (ref 1.7–7.7)
Neutrophils Relative %: 56 %
Platelets: 211 K/uL (ref 150–400)
RBC: 3.92 MIL/uL — ABNORMAL LOW (ref 4.22–5.81)
RDW: 14 % (ref 11.5–15.5)
WBC: 5.1 K/uL (ref 4.0–10.5)
nRBC: 0 % (ref 0.0–0.2)

## 2024-11-30 LAB — GLUCOSE, CAPILLARY
Glucose-Capillary: 114 mg/dL — ABNORMAL HIGH (ref 70–99)
Glucose-Capillary: 129 mg/dL — ABNORMAL HIGH (ref 70–99)
Glucose-Capillary: 105 mg/dL — ABNORMAL HIGH (ref 70–99)
Glucose-Capillary: 114 mg/dL — ABNORMAL HIGH (ref 70–99)

## 2024-11-30 LAB — COMPREHENSIVE METABOLIC PANEL WITH GFR
ALT: 64 U/L — ABNORMAL HIGH (ref 0–44)
AST: 46 U/L — ABNORMAL HIGH (ref 15–41)
Albumin: 2.8 g/dL — ABNORMAL LOW (ref 3.5–5.0)
Alkaline Phosphatase: 165 U/L — ABNORMAL HIGH (ref 38–126)
Anion gap: 8 (ref 5–15)
BUN: 13 mg/dL (ref 8–23)
CO2: 23 mmol/L (ref 22–32)
Calcium: 8.4 mg/dL — ABNORMAL LOW (ref 8.9–10.3)
Chloride: 104 mmol/L (ref 98–111)
Creatinine, Ser: 0.95 mg/dL (ref 0.61–1.24)
GFR, Estimated: >60 mL/min
Glucose, Bld: 108 mg/dL — ABNORMAL HIGH (ref 70–99)
Potassium: 3.9 mmol/L (ref 3.5–5.1)
Sodium: 135 mmol/L (ref 135–145)
Total Bilirubin: 0.3 mg/dL (ref 0.0–1.2)
Total Protein: 6.2 g/dL — ABNORMAL LOW (ref 6.5–8.1)

## 2024-11-30 LAB — MAGNESIUM: Magnesium: 2 mg/dL (ref 1.7–2.4)

## 2024-11-30 MED ORDER — CEFAZOLIN SODIUM-DEXTROSE 2-4 GM/100ML-% IV SOLN
2.0000 g | Freq: Three times a day (TID) | INTRAVENOUS | Status: DC
  Administered 2024-11-30 – 2024-12-01 (×3): 2 g via INTRAVENOUS
  Filled 2024-11-30 (×3): qty 100

## 2024-11-30 NOTE — Evaluation (Signed)
 Occupational Therapy Evaluation Patient Details Name: [name] MRN: [id number] DOB: 01/17/1941 Today's Date: 11/30/2024   History of Present Illness   84 yo male presented to the hospital with complaints of confusion and fatigue.  Currently found to have a E. coli bacteremia likely in the setting of acute cholecystitis.  PMH of dementia, left eye blindness, T2DM, HLD, HTN, DVT and A-fib not on anticoagulation, CHB, PPM implant, failure to thrive     Clinical Impressions PTA, pt lives with spouse, ambulatory without AD and receives assist for ADLs d/t baseline cognitive deficits. Pt presents now with with deficits in strength and balance; has not walked in 5 days per spouse. Pt requires Mod A x 2 for bed mobility, up to Mod-Max A x 2 to stand and transfer to chair d/t initial posterior lean in standing. Wife at bedside and assisted in engaging pt during session. As pt requiring now +2 assist for standing ADLs/mobility, may benefit from postacute rehab stay < 3 hours therapy per day unless family feels comfortable providing this initial increased assistance at home. Noted home environment may be more beneficial d/t pt cognition as long as long as family able to safely provide the physical assist needed.     If plan is discharge home, recommend the following:   A lot of help with walking and/or transfers;Two people to help with walking and/or transfers;A lot of help with bathing/dressing/bathroom;Two people to help with bathing/dressing/bathroom     Functional Status Assessment   Patient has had a recent decline in their functional status and demonstrates the ability to make significant improvements in function in a reasonable and predictable amount of time.     Equipment Recommendations   Other (comment) (TBD pending progress)     Recommendations for Other Services         Precautions/Restrictions   Precautions Precautions: Fall Recall of Precautions/Restrictions:  Impaired Restrictions Weight Bearing Restrictions Per Provider Order: No     Mobility Bed Mobility Overal bed mobility: Needs Assistance Bed Mobility: Supine to Sit     Supine to sit: Mod assist, +2 for physical assistance, +2 for safety/equipment     General bed mobility comments: cues and education to initiate task, able to bring LE off of bed but required assist to lift trunk and scoot to EOB    Transfers Overall transfer level: Needs assistance Equipment used: 2 person hand held assist Transfers: Sit to/from Stand, Bed to chair/wheelchair/BSC Sit to Stand: Mod assist, Max assist, +2 physical assistance, +2 safety/equipment     Step pivot transfers: Mod assist, +2 physical assistance, +2 safety/equipment     General transfer comment: heavy +2 to stand with initial posterior lean noted. Once able to initiate steps with cues, posterior bias improved though +2 still needed for safety and max step by step cues      Balance Overall balance assessment: Needs assistance Sitting-balance support: Feet supported, Feet unsupported, No upper extremity supported Sitting balance-Leahy Scale: Fair Sitting balance - Comments: CGA for safety Postural control: Posterior lean Standing balance support: Bilateral upper extremity supported Standing balance-Leahy Scale: Poor                             ADL either performed or assessed with clinical judgement   ADL Overall ADL's : Needs assistance/impaired     Grooming: Minimal assistance;Sitting   Upper Body Bathing: Maximal assistance;Sitting   Lower Body Bathing: +2 for physical assistance;+2 for safety/equipment;Sitting/lateral  leans;Sit to/from stand;Total assistance   Upper Body Dressing : Maximal assistance;Sitting   Lower Body Dressing: +2 for physical assistance;+2 for safety/equipment;Total assistance;Sit to/from stand   Toilet Transfer: Maximal assistance;+2 for physical assistance;+2 for  safety/equipment;Stand-pivot   Toileting- Clothing Manipulation and Hygiene: Total assistance;+2 for physical assistance;+2 for safety/equipment;Sitting/lateral lean;Sit to/from stand               Vision Baseline Vision/History: 1 Wears glasses Ability to See in Adequate Light: 0 Adequate Patient Visual Report: No change from baseline Vision Assessment?: No apparent visual deficits     Perception         Praxis         Pertinent Vitals/Pain Pain Assessment Pain Assessment: No/denies pain     Extremity/Trunk Assessment Upper Extremity Assessment Upper Extremity Assessment: Difficult to assess due to impaired cognition   Lower Extremity Assessment Lower Extremity Assessment: Defer to PT evaluation   Cervical / Trunk Assessment Cervical / Trunk Assessment: Normal   Communication Communication Communication: Impaired Factors Affecting Communication: Reduced clarity of speech   Cognition Arousal: Alert Behavior During Therapy: Lability Cognition: History of cognitive impairments             OT - Cognition Comments: hx of dementia, able to answer yes/no questions. some statements nonsensical and easily distracted requiring cues to redirect throughout. family helpful in engaging pt and redirecting to tasks. max cues to sequence                 Following commands: Impaired Following commands impaired: Follows one step commands inconsistently     Cueing  General Comments   Cueing Techniques: Verbal cues;Gestural cues;Tactile cues  Wife at bedside   Exercises     Shoulder Instructions      Home Living Family/patient expects to be discharged to:: Private residence Living Arrangements: Spouse/significant other Available Help at Discharge: Available 24 hours/day;Family Type of Home: House Home Access: Stairs to enter [employer] of Steps: 1 Entrance Stairs-Rails: None Home Layout: One level     Bathroom Shower/Tub: Walk-in  shower;Tub/shower unit   Bathroom Toilet: Standard     Home Equipment: Cane - single point;Hand held Programmer, Systems (2 wheels);Wheelchair - manual;Shower seat          Prior Functioning/Environment Prior Level of Function : Needs assist  Cognitive Assist : Mobility (cognitive);ADLs (cognitive) Mobility (Cognitive): Set up cues ADLs (Cognitive): Step by step cues Physical Assist : ADLs (physical)     Mobility Comments: per pt's wife  pt sometimes uses SPC but does not need it, does not typically amb with it ADLs Comments: per pt's wife and dtr they  assist with bathing,  dressing, in and out of tub shower, bathing, meals. physical assist for bathing/dressing but typically able to groom and feed self - occasional cues needed for this    OT Problem List: Decreased strength;Decreased activity tolerance;Impaired balance (sitting and/or standing);Decreased cognition;Decreased safety awareness;Decreased knowledge of use of DME or AE   OT Treatment/Interventions: Self-care/ADL training;Therapeutic exercise;Energy conservation;DME and/or AE instruction;Therapeutic activities;Patient/family education;Balance training      OT Goals(Current goals can be found in the care plan section)   Acute Rehab OT Goals Patient Stated Goal: wife hopeful for him to get walking again, get his strength back OT Goal Formulation: With family Time For Goal Achievement: 12/14/24 Potential to Achieve Goals: Fair   OT Frequency:  Min 1X/week    Co-evaluation  AM-PAC OT 6 Clicks Daily Activity     Outcome Measure Help from another person eating meals?: A Lot Help from another person taking care of personal grooming?: A Lot Help from another person toileting, which includes using toliet, bedpan, or urinal?: Total Help from another person bathing (including washing, rinsing, drying)?: A Lot Help from another person to put on and taking off regular upper body clothing?: A  Lot Help from another person to put on and taking off regular lower body clothing?: Total 6 Click Score: 10   End of Session Equipment Utilized During Treatment: Gait belt Nurse Communication: Mobility status  Activity Tolerance: Patient tolerated treatment well Patient left: in chair;with call bell/phone within reach;with chair alarm set;with family/visitor present  OT Visit Diagnosis: Unsteadiness on feet (R26.81);Other abnormalities of gait and mobility (R26.89);Other symptoms and signs involving cognitive function                Time: 1225-1245 OT Time Calculation (min): 20 min Charges:  OT General Charges $OT Visit: 1 Visit OT Evaluation $OT Eval Moderate Complexity: 1 Mod  Mliss NOVAK, OTR/L Acute Rehab Services Office: [phone]   Mliss Fish 11/30/2024, 1:53 PM

## 2024-11-30 NOTE — TOC Progression Note (Signed)
 Transition of Care [facility]) - Progression Note    Patient Details  Name: [name] MRN: [id number] Date of Birth: 1941-09-28  Transition of Care [facility]) CM/SW Contact  Alfonse JONELLE Rex, RN Phone Number: 11/30/2024, 2:46 PM  Clinical Narrative:   PT eval completed, recommendation for short term rehab/SNF. Met with pt and spouse and bedside to introduce role of NCM and review for dc planning. Spouse reports no current home care services, spouse is primary care giver, assist with meals and ADLs, reports at baseline patient is ambulatory with no AD. Spouse states she will like to discuss recommendation for SNF with her daughter, NCM will follow up tomorrow.     Expected Discharge Plan: Skilled Nursing Facility Barriers to Discharge: Continued Medical Work up               Expected Discharge Plan and Services       Living arrangements for the past 2 months: Single Family Home                                       Social Drivers of Health (SDOH) Interventions SDOH Screenings   Food Insecurity: No Food Insecurity (10/12/2024)  Housing: Low Risk (10/12/2024)  Transportation Needs: No Transportation Needs (10/12/2024)  Utilities: Not At Risk (10/12/2024)  Depression (PHQ2-9): Low Risk (10/12/2024)  Financial Resource Strain: Low Risk (10/12/2024)  Social Connections: Unknown (11/27/2024)  Stress: No Stress Concern Present (10/12/2024)  Tobacco Use: Medium Risk (11/27/2024)    Readmission Risk Interventions    11/30/2024    2:45 PM 11/29/2024    3:57 PM 07/07/2022    1:58 PM  Readmission Risk Prevention Plan  Post Dischage Appt Complete Complete Complete  Medication Screening Complete Complete Complete  Transportation Screening Complete Complete Complete

## 2024-11-30 NOTE — Plan of Care (Signed)
   Problem: Coping: Goal: Level of anxiety will decrease Outcome: Progressing   Problem: Pain Managment: Goal: General experience of comfort will improve and/or be controlled Outcome: Progressing   Problem: Safety: Goal: Ability to remain free from injury will improve Outcome: Progressing

## 2024-11-30 NOTE — Progress Notes (Signed)
 "   Subjective:  Denies SSCP, palpitations or Dyspnea Dementia   Objective:  Vitals:   11/29/24 0523 11/29/24 1502 11/29/24 2318 11/30/24 0656  BP: 108/74 120/60 (!) 145/79 (!) 140/54  Pulse: 93 (!) 55 72 (!) 58  Resp: 18 16 16 18   Temp: 99 F (37.2 C) 98.3 F (36.8 C) (!) 100.7 F (38.2 C) 98 F (36.7 C)  TempSrc: Oral Oral Oral Oral  SpO2: 100% 100% 99% 99%  Weight:      Height:        Intake/Output from previous day:  Intake/Output Summary (Last 24 hours) at 11/30/2024 0928 Last data filed at 11/30/2024 0603 Gross per 24 hour  Intake 565.26 ml  Output 1800 ml  Net -1234.74 ml    Physical Exam: Elderly black male Dementia Lungs clear SEM Abdomen not acute No edema  Lab Results: Basic Metabolic Panel: Recent Labs    11/29/24 0919 11/30/24 0341  NA 136 135  K 3.9 3.9  CL 104 104  CO2 24 23  GLUCOSE 100* 108*  BUN 16 13  CREATININE 1.05 0.95  CALCIUM 8.2* 8.4*  MG 2.1 2.0   Liver Function Tests: Recent Labs    11/29/24 0919 11/30/24 0341  AST 56* 46*  ALT 81* 64*  ALKPHOS 167* 165*  BILITOT 0.4 0.3  PROT 5.9* 6.2*  ALBUMIN 2.9* 2.8*   No results for input(s): LIPASE, AMYLASE in the last 72 hours. CBC: Recent Labs    11/29/24 0919 11/30/24 0341  WBC 3.9* 5.1  NEUTROABS 2.5 2.8  HGB 11.6* 11.6*  HCT 34.7* 35.1*  MCV 88.5 89.5  PLT 208 211    Imaging: NM Hepatobiliary Liver Func Result Date: 11/29/2024 EXAM: NM HEPATOBILLARY SCAN 11/29/2024 11:26:21 AM TECHNIQUE: RADIOPHARMACEUTICAL: 5.5 mCi Tc-81m mebrofenin  Dynamic images of the abdomen and pelvis were obtained in the anterior projection for 1 hour after intravenous administration of radiopharmaceutical. Morphine  was administered intravenously and imaging continued for at least 30 minutes. COMPARISON: CT and ultrasound 11/27/2024. CLINICAL HISTORY: Cholecystitis. FINDINGS: Homogenous uptake within the liver. Normal clearance of the blood pool. Appropriate excretion into the biliary  system. Activity is visualized in the small bowel. No activity is visualized in the gallbladder at 45 minutes. Following intravenous administration of 3 mg morphine , the gallbladder fills. No evidence of acute cholecystitis. IMPRESSION: 1. No evidence of acute cholecystitis. 2. Patent cystic duct and common bile duct. Electronically signed by: Norleen Boxer MD MD 11/29/2024 11:39 AM EST RP Workstation: [id number]   ECHOCARDIOGRAM COMPLETE Result Date: 11/28/2024    ECHOCARDIOGRAM REPORT   Patient Name:   [name] Date of Exam: 11/28/2024 Medical Rec #:  [id number]        Height:       68.0 in Accession #:    [id number]       Weight:       150.0 lb Date of Birth:  [birth year]         BSA:          1.809 m Patient Age:    83 years         BP:           103/58 mmHg Patient Gender: M                HR:           65 bpm. Exam Location:  Inpatient Procedure: 2D Echo (Both Spectral and Color Flow Doppler were utilized during  procedure). Indications:    pre-operative evaluation  History:        Patient has prior history of Echocardiogram examinations, most                 recent 07/02/2022. Pacemaker, Arrythmias:Paroxysmal atrial                 fibrillation, Signs/Symptoms:Altered Mental Status; Risk                 Factors:Hypertension, Diabetes and Dyslipidemia.  Sonographer:    Tinnie Barefoot RDCS Referring Phys: [id number] [facility] M PATEL  Sonographer Comments: Image acquisition challenging due to uncooperative patient. IMPRESSIONS  1. Left ventricular ejection fraction, by estimation, is 55 to 60%. The left ventricle has normal function. The left ventricle has no regional wall motion abnormalities. Left ventricular diastolic parameters are consistent with Grade II diastolic dysfunction (pseudonormalization). Elevated left atrial pressure. The E/e' is 18.  2. Right ventricular systolic function is normal. The right ventricular size is normal. There is mildly elevated pulmonary artery systolic pressure.  The estimated right ventricular systolic pressure is 39.2 mmHg.  3. The mitral valve is grossly normal. Trivial mitral valve regurgitation. No evidence of mitral stenosis.  4. The aortic valve is grossly normal. Aortic valve regurgitation is mild. Aortic valve sclerosis is present, with no evidence of aortic valve stenosis.  5. The inferior vena cava is normal in size with greater than 50% respiratory variability, suggesting right atrial pressure of 3 mmHg. Comparison(s): A prior study was performed on 07/02/2022. LVEF 60-65%, normal diastolic dysfunction, moderate LAE, estimated RAP . FINDINGS  Left Ventricle: Left ventricular ejection fraction, by estimation, is 55 to 60%. The left ventricle has normal function. The left ventricle has no regional wall motion abnormalities. The left ventricular internal cavity size was small. There is no left ventricular hypertrophy. Left ventricular diastolic parameters are consistent with Grade II diastolic dysfunction (pseudonormalization). Elevated left atrial pressure. The E/e' is 8. Right Ventricle: The right ventricular size is normal. No increase in right ventricular wall thickness. Right ventricular systolic function is normal. There is mildly elevated pulmonary artery systolic pressure. The tricuspid regurgitant velocity is 3.01  m/s, and with an assumed right atrial pressure of 3 mmHg, the estimated right ventricular systolic pressure is 39.2 mmHg. Left Atrium: Left atrial size was normal in size. Right Atrium: Right atrial size was normal in size. Pericardium: Trivial pericardial effusion is present. Mitral Valve: The mitral valve is grossly normal. Trivial mitral valve regurgitation. No evidence of mitral valve stenosis. Tricuspid Valve: The tricuspid valve is normal in structure. Tricuspid valve regurgitation is mild . No evidence of tricuspid stenosis. Aortic Valve: The aortic valve is grossly normal. Aortic valve regurgitation is mild. Aortic valve sclerosis  is present, with no evidence of aortic valve stenosis. Pulmonic Valve: The pulmonic valve was not well visualized. Pulmonic valve regurgitation is not visualized. No evidence of pulmonic stenosis. Aorta: The aortic root is normal in size and structure. Venous: The inferior vena cava is normal in size with greater than 50% respiratory variability, suggesting right atrial pressure of 3 mmHg. IAS/Shunts: The atrial septum is grossly normal.  LEFT VENTRICLE PLAX 2D LVIDd:         3.90 cm     Diastology LVIDs:         2.40 cm     LV e' medial:    6.02 cm/s LV PW:         0.90 cm     LV  E/e' medial:  18.8 LV IVS:        0.90 cm     LV e' lateral:   6.67 cm/s LVOT diam:     1.60 cm     LV E/e' lateral: 17.0 LV SV:         39 LV SV Index:   21 LVOT Area:     2.01 cm LV IVRT:       113 msec  LV Volumes (MOD) LV vol d, MOD A2C: 50.3 ml LV vol d, MOD A4C: 54.3 ml LV vol s, MOD A2C: 18.7 ml LV vol s, MOD A4C: 26.1 ml LV SV MOD A2C:     31.6 ml LV SV MOD A4C:     54.3 ml LV SV MOD BP:      31.7 ml RIGHT VENTRICLE             IVC RV Basal diam:  2.60 cm     IVC diam: 1.70 cm RV S prime:     12.00 cm/s TAPSE (M-mode): 2.4 cm LEFT ATRIUM             Index        RIGHT ATRIUM           Index LA diam:        2.60 cm 1.44 cm/m   RA Area:     14.60 cm LA Vol (A2C):   40.6 ml 22.45 ml/m  RA Volume:   36.90 ml  20.40 ml/m LA Vol (A4C):   36.6 ml 20.24 ml/m LA Biplane Vol: 41.9 ml 23.17 ml/m  AORTIC VALVE LVOT Vmax:   93.30 cm/s LVOT Vmean:  60.200 cm/s LVOT VTI:    0.192 m  AORTA Ao Root diam: 3.50 cm MITRAL VALVE                TRICUSPID VALVE MV Area (PHT): 2.80 cm     TV Peak grad:   44.4 mmHg MV Decel Time: 271 msec     TV Vmax:        3.33 m/s MV E velocity: 113.00 cm/s  TR Peak grad:   36.2 mmHg MV A velocity: 127.00 cm/s  TR Vmax:        301.00 cm/s MV E/A ratio:  0.89                             SHUNTS                             Systemic VTI:  0.19 m                             Systemic Diam: 1.60 cm Sunit Tolia  Electronically signed by Madonna Large Signature Date/Time: 11/28/2024/3:34:37 PM    Final     Cardiac Studies:  ECG: afib rate 90 IVCD LBBB morphology  Telemetry:  NSR V escape pacing   Echo: EF 55-60% normal RV AV sclerosis   Medications:    polyethylene glycol  17 g Oral Daily   senna-docusate  2 tablet Oral BID   simethicone   80 mg Oral QID       ceFAZolin  (ANCEF ) IV      Assessment/Plan:   Preoperative:  premature consult as patient does not appear to need GB surgery LFTls improving and HIDA scan negative PPM:  normal functioning Micra leadless system Sepsis:  TTE with normal EF and no signs of SBE/vegetations  On Ancef   Dementia  Severe   Cardiology will sign off   Maude Emmer 11/30/2024, 9:28 AM    "

## 2024-11-30 NOTE — Progress Notes (Signed)
 "      Subjective: Patient pleasant today.  Denies any abdominal pain.  Tolerating CLD with no issues   Objective: Vital signs in last 24 hours: Temp:  [98 F (36.7 C)-100.7 F (38.2 C)] 98 F (36.7 C) (01/13 0656) Pulse Rate:  [55-72] 58 (01/13 0656) Resp:  [16-18] 18 (01/13 0656) BP: (120-145)/(54-79) 140/54 (01/13 0656) SpO2:  [99 %-100 %] 99 % (01/13 0656) Last BM Date : 11/29/24  Intake/Output from previous day: 01/12 0701 - 01/13 0700 In: 565.3 [P.O.:360; I.V.:105.3; IV Piggyback:100] Out: 2080 [Urine:2080] Intake/Output this shift: No intake/output data recorded.  PE: Gen: NAD Abd: soft, NT, ND, doesn't wince or guard to palpation on exam currently  Lab Results:  Recent Labs    11/29/24 0919 11/30/24 0341  WBC 3.9* 5.1  HGB 11.6* 11.6*  HCT 34.7* 35.1*  PLT 208 211   BMET Recent Labs    11/29/24 0919 11/30/24 0341  NA 136 135  K 3.9 3.9  CL 104 104  CO2 24 23  GLUCOSE 100* 108*  BUN 16 13  CREATININE 1.05 0.95  CALCIUM 8.2* 8.4*   PT/INR No results for input(s): LABPROT, INR in the last 72 hours.  CMP     Component Value Date/Time   NA 135 11/30/2024 0341   NA 139 06/13/2022 1103   K 3.9 11/30/2024 0341   CL 104 11/30/2024 0341   CO2 23 11/30/2024 0341   GLUCOSE 108 (H) 11/30/2024 0341   BUN 13 11/30/2024 0341   BUN 13 06/13/2022 1103   CREATININE 0.95 11/30/2024 0341   CALCIUM 8.4 (L) 11/30/2024 0341   PROT 6.2 (L) 11/30/2024 0341   PROT 7.1 10/11/2019 1206   ALBUMIN 2.8 (L) 11/30/2024 0341   ALBUMIN 4.0 10/11/2019 1206   AST 46 (H) 11/30/2024 0341   ALT 64 (H) 11/30/2024 0341   ALKPHOS 165 (H) 11/30/2024 0341   BILITOT 0.3 11/30/2024 0341   BILITOT 0.3 10/11/2019 1206   GFRNONAA >60 11/30/2024 0341   GFRAA >60 10/14/2019 0012   Lipase     Component Value Date/Time   LIPASE 23 09/14/2019 1849       Studies/Results: NM Hepatobiliary Liver Func Result Date: 11/29/2024 EXAM: NM HEPATOBILLARY SCAN 11/29/2024 11:26:21  AM TECHNIQUE: RADIOPHARMACEUTICAL: 5.5 mCi Tc-28m mebrofenin  Dynamic images of the abdomen and pelvis were obtained in the anterior projection for 1 hour after intravenous administration of radiopharmaceutical. Morphine  was administered intravenously and imaging continued for at least 30 minutes. COMPARISON: CT and ultrasound 11/27/2024. CLINICAL HISTORY: Cholecystitis. FINDINGS: Homogenous uptake within the liver. Normal clearance of the blood pool. Appropriate excretion into the biliary system. Activity is visualized in the small bowel. No activity is visualized in the gallbladder at 45 minutes. Following intravenous administration of 3 mg morphine , the gallbladder fills. No evidence of acute cholecystitis. IMPRESSION: 1. No evidence of acute cholecystitis. 2. Patent cystic duct and common bile duct. Electronically signed by: Norleen Boxer MD MD 11/29/2024 11:39 AM EST RP Workstation: [id number]   ECHOCARDIOGRAM COMPLETE Result Date: 11/28/2024    ECHOCARDIOGRAM REPORT   Patient Name:   [name] Date of Exam: 11/28/2024 Medical Rec #:  [id number]        Height:       68.0 in Accession #:    [id number]       Weight:       150.0 lb Date of Birth:  [birth year]         BSA:  1.809 m Patient Age:    83 years         BP:           103/58 mmHg Patient Gender: M                HR:           65 bpm. Exam Location:  Inpatient Procedure: 2D Echo (Both Spectral and Color Flow Doppler were utilized during            procedure). Indications:    pre-operative evaluation  History:        Patient has prior history of Echocardiogram examinations, most                 recent 07/02/2022. Pacemaker, Arrythmias:Paroxysmal atrial                 fibrillation, Signs/Symptoms:Altered Mental Status; Risk                 Factors:Hypertension, Diabetes and Dyslipidemia.  Sonographer:    Tinnie Barefoot RDCS Referring Phys: [id number] [facility] M PATEL  Sonographer Comments: Image acquisition challenging due to uncooperative patient.  IMPRESSIONS  1. Left ventricular ejection fraction, by estimation, is 55 to 60%. The left ventricle has normal function. The left ventricle has no regional wall motion abnormalities. Left ventricular diastolic parameters are consistent with Grade II diastolic dysfunction (pseudonormalization). Elevated left atrial pressure. The E/e' is 18.  2. Right ventricular systolic function is normal. The right ventricular size is normal. There is mildly elevated pulmonary artery systolic pressure. The estimated right ventricular systolic pressure is 39.2 mmHg.  3. The mitral valve is grossly normal. Trivial mitral valve regurgitation. No evidence of mitral stenosis.  4. The aortic valve is grossly normal. Aortic valve regurgitation is mild. Aortic valve sclerosis is present, with no evidence of aortic valve stenosis.  5. The inferior vena cava is normal in size with greater than 50% respiratory variability, suggesting right atrial pressure of 3 mmHg. Comparison(s): A prior study was performed on 07/02/2022. LVEF 60-65%, normal diastolic dysfunction, moderate LAE, estimated RAP . FINDINGS  Left Ventricle: Left ventricular ejection fraction, by estimation, is 55 to 60%. The left ventricle has normal function. The left ventricle has no regional wall motion abnormalities. The left ventricular internal cavity size was small. There is no left ventricular hypertrophy. Left ventricular diastolic parameters are consistent with Grade II diastolic dysfunction (pseudonormalization). Elevated left atrial pressure. The E/e' is 13. Right Ventricle: The right ventricular size is normal. No increase in right ventricular wall thickness. Right ventricular systolic function is normal. There is mildly elevated pulmonary artery systolic pressure. The tricuspid regurgitant velocity is 3.01  m/s, and with an assumed right atrial pressure of 3 mmHg, the estimated right ventricular systolic pressure is 39.2 mmHg. Left Atrium: Left atrial size was  normal in size. Right Atrium: Right atrial size was normal in size. Pericardium: Trivial pericardial effusion is present. Mitral Valve: The mitral valve is grossly normal. Trivial mitral valve regurgitation. No evidence of mitral valve stenosis. Tricuspid Valve: The tricuspid valve is normal in structure. Tricuspid valve regurgitation is mild . No evidence of tricuspid stenosis. Aortic Valve: The aortic valve is grossly normal. Aortic valve regurgitation is mild. Aortic valve sclerosis is present, with no evidence of aortic valve stenosis. Pulmonic Valve: The pulmonic valve was not well visualized. Pulmonic valve regurgitation is not visualized. No evidence of pulmonic stenosis. Aorta: The aortic root is normal in size and structure. Venous: The  inferior vena cava is normal in size with greater than 50% respiratory variability, suggesting right atrial pressure of 3 mmHg. IAS/Shunts: The atrial septum is grossly normal.  LEFT VENTRICLE PLAX 2D LVIDd:         3.90 cm     Diastology LVIDs:         2.40 cm     LV e' medial:    6.02 cm/s LV PW:         0.90 cm     LV E/e' medial:  18.8 LV IVS:        0.90 cm     LV e' lateral:   6.67 cm/s LVOT diam:     1.60 cm     LV E/e' lateral: 17.0 LV SV:         39 LV SV Index:   21 LVOT Area:     2.01 cm LV IVRT:       113 msec  LV Volumes (MOD) LV vol d, MOD A2C: 50.3 ml LV vol d, MOD A4C: 54.3 ml LV vol s, MOD A2C: 18.7 ml LV vol s, MOD A4C: 26.1 ml LV SV MOD A2C:     31.6 ml LV SV MOD A4C:     54.3 ml LV SV MOD BP:      31.7 ml RIGHT VENTRICLE             IVC RV Basal diam:  2.60 cm     IVC diam: 1.70 cm RV S prime:     12.00 cm/s TAPSE (M-mode): 2.4 cm LEFT ATRIUM             Index        RIGHT ATRIUM           Index LA diam:        2.60 cm 1.44 cm/m   RA Area:     14.60 cm LA Vol (A2C):   40.6 ml 22.45 ml/m  RA Volume:   36.90 ml  20.40 ml/m LA Vol (A4C):   36.6 ml 20.24 ml/m LA Biplane Vol: 41.9 ml 23.17 ml/m  AORTIC VALVE LVOT Vmax:   93.30 cm/s LVOT Vmean:  60.200  cm/s LVOT VTI:    0.192 m  AORTA Ao Root diam: 3.50 cm MITRAL VALVE                TRICUSPID VALVE MV Area (PHT): 2.80 cm     TV Peak grad:   44.4 mmHg MV Decel Time: 271 msec     TV Vmax:        3.33 m/s MV E velocity: 113.00 cm/s  TR Peak grad:   36.2 mmHg MV A velocity: 127.00 cm/s  TR Vmax:        301.00 cm/s MV E/A ratio:  0.89                             SHUNTS                             Systemic VTI:  0.19 m                             Systemic Diam: 1.60 cm Sunit Tolia Electronically signed by Madonna Large Signature Date/Time: 11/28/2024/3:34:37 PM    Final     Anti-infectives: Anti-infectives (From admission, onward)  Start     Dose/Rate Route Frequency Ordered Stop   11/30/24 2200  ceFAZolin  (ANCEF ) IVPB 2g/100 mL premix        2 g 200 mL/hr over 30 Minutes Intravenous Every 8 hours 11/30/24 0855     11/27/24 2200  cefTRIAXone  (ROCEPHIN ) 2 g in sodium chloride  0.9 % 100 mL IVPB  Status:  Discontinued        2 g 200 mL/hr over 30 Minutes Intravenous Every 24 hours 11/27/24 1522 11/30/24 0855   11/27/24 1430  metroNIDAZOLE  (FLAGYL ) IVPB 500 mg  Status:  Discontinued        500 mg 100 mL/hr over 60 Minutes Intravenous Every 12 hours 11/27/24 0810 11/27/24 1525   11/27/24 1400  vancomycin  (VANCOREADY) IVPB 750 mg/150 mL  Status:  Discontinued        750 mg 150 mL/hr over 60 Minutes Intravenous Daily 11/27/24 0817 11/27/24 1521   11/27/24 1130  ceFEPIme  (MAXIPIME ) 2 g in sodium chloride  0.9 % 100 mL IVPB  Status:  Discontinued        2 g 200 mL/hr over 30 Minutes Intravenous Every 12 hours 11/27/24 0810 11/27/24 1521   11/27/24 0330  ceFEPIme  (MAXIPIME ) 2 g in sodium chloride  0.9 % 100 mL IVPB        2 g 200 mL/hr over 30 Minutes Intravenous  Once 11/27/24 0317 11/27/24 0417   11/27/24 0330  metroNIDAZOLE  (FLAGYL ) IVPB 500 mg        500 mg 100 mL/hr over 60 Minutes Intravenous  Once 11/27/24 0317 11/27/24 0502   11/27/24 0330  vancomycin  (VANCOCIN ) IVPB 1000 mg/200 mL premix         1,000 mg 200 mL/hr over 60 Minutes Intravenous  Once 11/27/24 0317 11/27/24 0455        Assessment/Plan Cholelithiasis, elevated AST/ALT/AP with abdominal pain -HIDA scan with no evidence of acute cholecystitis currently -it is possible he may have biliary colic from time to time or may  have passed a stone given elevated in LFTs, but this are down trending.   -patient is currently nontender. -given normal HIDA and normal WBC, will not plan to move forward with lap chole at this time.   -tolerated CLD yesterday.  Will adv to [facility] diet today -given his dementia, would try to avoid surgery moving forward for this patient, but at least for right now with no evidence of an acute infection. -no further surgical needs at this time.  D/w primary service  FEN - HH VTE - SCDs ID - Rocephin   I reviewed hospitalist notes, last 24 h vitals and pain scores, last 48 h intake and output, last 24 h labs and trends, and last 24 h imaging results.   LOS: 3 days    Burnard FORBES Banter , [facility] 11/30/2024, 9:28 AM Please see Amion for pager number during day hours 7:00am-4:30pm or 7:00am -11:30am on weekends  "

## 2024-11-30 NOTE — Progress Notes (Signed)
 [facility] Progress Note Patient: [name] FMW:[id number] DOB: 1941-07-13  DOA: 11/27/2024 DOS: the patient was seen and examined on 11/30/2024  Brief Hospital Course: Patient with PMH of dementia, left eye blindness, T2DM, HLD, HTN, DVT and A-fib not on anticoagulation, CHB SP PPM implant, failure to thrive presented to the hospital with complaints of confusion and fatigue. Currently found to have a E. coli bacteremia likely in the setting of acute cholecystitis.  Assessment and Plan: E. coli bacteremia. Likely in the setting of possible acute cholecystitis with sepsis present on admission. Acute metabolic encephalopathy. LFT elevation. Presented with poor p.o. intake and fatigue. Workup on admission shows AST ALT 667/322 with total bilirubin 1.3.  Serum creatinine 1.43.  Leukocytosis 11.4, Tmax 102.1.  Meeting SIRS criteria. Ultrasound abdomen shows gallstones without any evidence of acute cholecystitis. CBD 4 mm. CT abdomen also did not show any evidence of acute abnormality other than distended stomach. Patient was treated with IV antibiotic and IV fluid. LFT shows improvement. Blood cultures came back positive for E. coli. Sensitivity currently pending. Fever improved sepsis physiology resolved. General surgery consulted.  HIDA scan recommended.  Negative for any cholecystitis.   At present given negative HIDA scan, surgery thinks that the risk of gallbladder surgery is significantly high compared to the outcome and therefore recommend conservative measures. Switching to oral cefazolin .  If doing well overnight, will switch to oral and likely can be discharged tomorrow.  Preop clearance. Cardiology was consulted for preop p.o., patient is cleared for cardiology point of view.  Gastric distention. Seen on CT scan upon admission although on repeat x-ray it appears to have resolved. Patient did not have any nausea or vomiting. For now monitor.  Severe  constipation. Continue aggressive bowel regimen. Monitor.  History of paroxysmal A-fib, CHB SP PPM implant. Not on any anticoagulation due to bleeding. Rate is controlled. Has a pacemaker. Cardiology consult preop clearance.  T2DM without any complication without long-term insulin  use. Currently n.p.o. and sliding-scale insulin . Advancing to clear liquid diet. Monitor. A1c was 5.7.  GERD. Continue PPI.  HTN. Blood pressure stable. Currently holding medication.  HLD. Not on any medication. Monitor.  AKI. Baseline serum creatinine appears to be around 1.1. Serum creatinine on admission 1.43. Likely dehydration.  Treated with IV fluid.  Monitoring.  Dementia.  Acute metabolic encephalopathy Currently oriented to place person. Monitor for delirium. Still with poor p.o. intake.  Will monitor.   Subjective: Minimal oral intake.  No nausea no vomiting.  Reports abdominal pain.  Reports hiccups.  Physical Exam: Clear to auscultation. S1-S2 present The right sided pain present. Passing gas.  Bowel sound present. No edema.  Data Reviewed: I have Reviewed nursing notes, Vitals, and Lab results. Since last encounter, pertinent lab results CBC and BMP   . I have ordered test including CBC and BMP  .  Discussed with general surgery.  Disposition: Status is: Inpatient Remains inpatient appropriate because: Monitor for improvement in abdominal pain.  SCDs Start: 11/27/24 0806   Family Communication: Family at bedside. Level of care: Telemetry   Vitals:   11/29/24 1502 11/29/24 2318 11/30/24 0656 11/30/24 1350  BP: 120/60 (!) 145/79 (!) 140/54 115/81  Pulse: (!) 55 72 (!) 58 78  Resp: 16 16 18 17   Temp: 98.3 F (36.8 C) (!) 100.7 F (38.2 C) 98 F (36.7 C) 98 F (36.7 C)  TempSrc: Oral Oral Oral   SpO2: 100% 99% 99% 100%  Weight:      Height:  Author: Yetta Blanch, MD 11/30/2024 6:41 PM  Please look on [email or web address] to find out who is on call.

## 2024-12-01 DIAGNOSIS — A419 Sepsis, unspecified organism: Secondary | ICD-10-CM | POA: Diagnosis not present

## 2024-12-01 LAB — CBC WITH DIFFERENTIAL/PLATELET
Abs Immature Granulocytes: 0.02 K/uL (ref 0.00–0.07)
Basophils Absolute: 0 K/uL (ref 0.0–0.1)
Basophils Relative: 1 %
Eosinophils Absolute: 0.3 K/uL (ref 0.0–0.5)
Eosinophils Relative: 4 %
HCT: 33.7 % — ABNORMAL LOW (ref 39.0–52.0)
Hemoglobin: 11.1 g/dL — ABNORMAL LOW (ref 13.0–17.0)
Immature Granulocytes: 0 %
Lymphocytes Relative: 30 %
Lymphs Abs: 1.8 K/uL (ref 0.7–4.0)
MCH: 29.3 pg (ref 26.0–34.0)
MCHC: 32.9 g/dL (ref 30.0–36.0)
MCV: 88.9 fL (ref 80.0–100.0)
Monocytes Absolute: 1.1 K/uL — ABNORMAL HIGH (ref 0.1–1.0)
Monocytes Relative: 19 %
Neutro Abs: 2.8 K/uL (ref 1.7–7.7)
Neutrophils Relative %: 46 %
Platelets: 234 K/uL (ref 150–400)
RBC: 3.79 MIL/uL — ABNORMAL LOW (ref 4.22–5.81)
RDW: 13.9 % (ref 11.5–15.5)
WBC: 5.9 K/uL (ref 4.0–10.5)
nRBC: 0 % (ref 0.0–0.2)

## 2024-12-01 LAB — COMPREHENSIVE METABOLIC PANEL WITH GFR
ALT: 50 U/L — ABNORMAL HIGH (ref 0–44)
AST: 37 U/L (ref 15–41)
Albumin: 2.9 g/dL — ABNORMAL LOW (ref 3.5–5.0)
Alkaline Phosphatase: 149 U/L — ABNORMAL HIGH (ref 38–126)
Anion gap: 8 (ref 5–15)
BUN: 13 mg/dL (ref 8–23)
CO2: 25 mmol/L (ref 22–32)
Calcium: 8.5 mg/dL — ABNORMAL LOW (ref 8.9–10.3)
Chloride: 107 mmol/L (ref 98–111)
Creatinine, Ser: 1.06 mg/dL (ref 0.61–1.24)
GFR, Estimated: >60 mL/min
Glucose, Bld: 108 mg/dL — ABNORMAL HIGH (ref 70–99)
Potassium: 3.8 mmol/L (ref 3.5–5.1)
Sodium: 139 mmol/L (ref 135–145)
Total Bilirubin: 0.3 mg/dL (ref 0.0–1.2)
Total Protein: 6.2 g/dL — ABNORMAL LOW (ref 6.5–8.1)

## 2024-12-01 LAB — GLUCOSE, CAPILLARY
Glucose-Capillary: 104 mg/dL — ABNORMAL HIGH (ref 70–99)
Glucose-Capillary: 136 mg/dL — ABNORMAL HIGH (ref 70–99)

## 2024-12-01 LAB — MAGNESIUM: Magnesium: 2 mg/dL (ref 1.7–2.4)

## 2024-12-01 MED ORDER — LEVOFLOXACIN 500 MG PO TABS
500.0000 mg | ORAL_TABLET | Freq: Every day | ORAL | Status: DC
  Administered 2024-12-01: 500 mg via ORAL
  Filled 2024-12-01: qty 1

## 2024-12-01 NOTE — Progress Notes (Signed)
 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 additionally  Recent admission 10/20 through 10 25 2025 with heart block as well as possible UTI  1/11 brought to the hospital because of abdominal pain CT scan showed chronic urinary bladder wall thickening gallbladder showed gallstones no cholecystitis US  abdomen showed gallstones without cholecystitis CBD 4 mm--- AST ALT 667/322 with bilirubin 1.3 on leukocytosis 11.4 with Tmax 102 1/12 HIDA scan negative for cholecystitis with patent cystic and common bile duct--- cardiology consulted for preop eval and signed off-General Surgery consulted and did not feel this was acute cholecystitis may have been biliary colic-no need for surgery and was advanced on diet   Assessment  & Plan :    E. coli bacteremia Source likely urinary infection although diagnosis of cholecystitis initially entertained-Ancef  Q8-culture is pansensitive so we will switch to Levaquin  to complete a 10-day course total of antibiotics ending on 1/18 Watch white count etc.  Gastric distention with severe constipation Aggressive bowel management with MiraLAX  17 g daily and 2 tabs Senokot S twice daily Bowel movements remain uncharted  Paroxysmal A-fib, complete heart block with pacemaker placed 08/2024 Epistaxis with has bled score 3 CHADVASC of 4 No rate control at this time given heart block-monitor trends but can discontinue continuous heart monitors  Previous bilateral DVTs  in 2015 Not a candidate for anticoagulation given epistaxis  Previous well-controlled DM TY 2 with A1c 5.7 Does not require sliding scale stop checking  AKI on admission superimposed on CKD 3 AA creatinine on admit 1.4 Monitor trends only  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 Orientable to place person only-Will need skilled  Data Reviewed today: Sodium 139 potassium 3.8 BUN/creatinine 13/1.0 alk phos 149 albumin 2.9 AST/ALT 37/50 bilirubin 0.3 WBC 5.9 hemoglobin 11.1 platelet 234   DVT prophylaxis: SCD   Dispo/Global plan: Awaiting skilled placement and management--- updated wife     Subjective:   Incoherent thinks that he is at [employer] no chest pain no fever no nausea-no real stool today wife at bedside    Objective + exam Vitals:   11/30/24 2037 12/01/24 0531 12/01/24 0532 12/01/24 1518  BP: 131/73 129/68 129/68 118/80  Pulse: 83 60 (!) 59 78  Resp: 16 19 19 18   Temp: 98.5 F (36.9 C) 97.7 F (36.5 C) 97.7 F (36.5 C) (!) 97.4 F (36.3 C)  TempSrc: Oral Oral Oral Oral  SpO2: 100% 100% 100% 100%  Weight:      Height:       Filed Weights   11/27/24 0152  Weight: 68 kg     Examination: EOMI NCAT no focal deficit confused no icterus no pallor neck soft supple chest is clear posterior laterally and anteriorly--S1-S2 no murmur no rub no gallop--abdomen slight distention no rebound no guarding--no lower extremity edema power 5/5 ROM intact--euthymic but a little bit incoherent  Scheduled Meds:  levofloxacin   500 mg Oral Daily   polyethylene glycol  17 g Oral Daily   senna-docusate  2 tablet Oral BID   simethicone   80 mg Oral QID   Continuous Infusions:   ceFAZolin  (ANCEF ) IV 200 mL/hr at 12/01/24 1515   acetaminophen  **OR** acetaminophen , ondansetron  **OR** ondansetron  (ZOFRAN ) IV, oxyCODONE   Time 60  Colen Grimes, MD  [facility]

## 2024-12-01 NOTE — Plan of Care (Signed)

## 2024-12-01 NOTE — Progress Notes (Signed)
 Physical Therapy Treatment Patient Details Name: [name] MRN: [id number] DOB: [birth year] Today's Date: 12/01/2024   History of Present Illness 84 yo male presented to the hospital with complaints of confusion and fatigue.  Currently found to have a E. coli bacteremia likely in the setting of acute cholecystitis.  PMH of dementia, left eye blindness, T2DM, HLD, HTN, DVT and A-fib not on anticoagulation, CHB, PPM implant, failure to thrive    PT Comments  AxO x 1 following repeat, simple commands.  Pleasant.  Funny.  Spouse in room assisting Pt with breakfast.  Cueing with each bite.  Open up and eat some more.  Allowed Spouse to cue during amb.  Pt with Hx Dementia, prior living home with Spouse.  She assisted with ADL's, medication management, meals/feeding.  Pt was able to amb IND with NO AD.  Assisted OOB to amb was difficult and required + 2 assist.  General bed mobility comments: repeat instructions to complete task.  Required increased assist to complete scooting to EOB. General transfer comment: heavy +2 to stand with initial posterior lean noted. Once able to initiate steps with cues, posterior bias improved though +2 still needed for safety and max step by step cues.  General Gait Details: Pt required + 2 side by side hand held assist with Spouse in front offering instructions for direction and increase distance.  Steps are very short, shuffled and posture is flexed.  Posterior lean.Poor self awareness to self correct.   HIGH FALL RISK. Positioned in recliner to comfort.   LPT has rec Pt will need ST Rehab at SNF to address mobility and functional decline prior to safely returning home.    If plan is discharge home, recommend the following: Two people to help with walking and/or transfers;Two people to help with bathing/dressing/bathroom;Help with stairs or ramp for entrance;Assist for transportation;Assistance with cooking/housework;Direct supervision/assist for medications  management;Direct supervision/assist for financial management;Supervision due to cognitive status   Can travel by private vehicle     No  Equipment Recommendations  None recommended by PT    Recommendations for Other Services       Precautions / Restrictions Precautions Precautions: Fall Precaution/Restrictions Comments: Hx Dementia Restrictions Weight Bearing Restrictions Per Provider Order: No     Mobility  Bed Mobility Overal bed mobility: Needs Assistance Bed Mobility: Supine to Sit     Supine to sit: Mod assist, +2 for physical assistance, +2 for safety/equipment, Max assist     General bed mobility comments: repeat instructions to complete task.  Required increased assist to complete scooting to EOB.    Transfers Overall transfer level: Needs assistance Equipment used: 2 person hand held assist Transfers: Sit to/from Stand, Bed to chair/wheelchair/BSC Sit to Stand: Mod assist, Max assist, +2 physical assistance, +2 safety/equipment           General transfer comment: heavy +2 to stand with initial posterior lean noted. Once able to initiate steps with cues, posterior bias improved though +2 still needed for safety and max step by step cues    Ambulation/Gait Ambulation/Gait assistance: Max assist, Mod assist, +2 physical assistance, +2 safety/equipment Gait Distance (Feet): 24 Feet Assistive device: None Gait Pattern/deviations: Step-to pattern, Step-through pattern, Decreased step length - right, Decreased step length - left, Knee flexed in stance - right, Knee flexed in stance - left, Shuffle, Ataxic, Festinating, Trunk flexed Gait velocity: decreased     General Gait Details: Pt required + 2 side by side hand held assist with Spouse in  front offering instructions for direction and increase distance.  Steps are very short, shuffled and posture is flexed.  Posterior lean.Poor self awareness to self correct.   HIGH FALL RISK.   Stairs              Wheelchair Mobility     Tilt Bed    Modified Rankin (Stroke Patients Only)       Balance                                            Communication Communication Communication: Impaired Factors Affecting Communication: Reduced clarity of speech  Cognition Arousal: Alert Behavior During Therapy: Lability   PT - Cognitive impairments: History of cognitive impairments                       PT - Cognition Comments: AxO x 1 following repeat, simple commands.  Spouse in room assisting Pt with breakfast.  Cueing with each bite.  Open up and eat some more.  Allowed Spouse to cue during amb. Following commands: Impaired Following commands impaired: Follows one step commands inconsistently    Cueing Cueing Techniques: Verbal cues, Gestural cues, Tactile cues  Exercises      General Comments        Pertinent Vitals/Pain Pain Assessment Pain Assessment: Faces Faces Pain Scale: Hurts little more Pain Location: L hand with mvmt/touch, visible edema throughout Pain Descriptors / Indicators: Grimacing Pain Intervention(s): Monitored during session    Home Living                          Prior Function            PT Goals (current goals can now be found in the care plan section) Progress towards PT goals: Progressing toward goals    Frequency    Min 3X/week      PT Plan      Co-evaluation              AM-PAC PT 6 Clicks Mobility   Outcome Measure  Help needed turning from your back to your side while in a flat bed without using bedrails?: A Lot Help needed moving from lying on your back to sitting on the side of a flat bed without using bedrails?: A Lot Help needed moving to and from a bed to a chair (including a wheelchair)?: A Lot Help needed standing up from a chair using your arms (e.g., wheelchair or bedside chair)?: A Lot Help needed to walk in hospital room?: A Lot Help needed climbing 3-5 steps with  a railing? : Total 6 Click Score: 11    End of Session Equipment Utilized During Treatment: Gait belt Activity Tolerance: Patient tolerated treatment well Patient left: in chair;with call bell/phone within reach;with chair alarm set;with family/visitor present Nurse Communication: Mobility status PT Visit Diagnosis: Other abnormalities of gait and mobility (R26.89)     Time: 9049-8984 PT Time Calculation (min) (ACUTE ONLY): 25 min  Charges:    $Gait Training: 8-22 mins $Therapeutic Activity: 8-22 mins PT General Charges $$ ACUTE PT VISIT: 1 Visit                     Katheryn Leap  PTA Acute  Rehabilitation Services Office M-F          [phone]

## 2024-12-01 NOTE — [place] FL2 (Signed)
 " [place]  MEDICAID FL2 LEVEL OF CARE FORM     IDENTIFICATION  Patient Name: [name] Birthdate: 09-22-1941 Sex: male Admission Date (Current Location): 11/27/2024  [facility] and [place] Number:  Producer, Television/film/video and Address:  [facility],  [address], [place] [address]      Provider Number: [id number]  Attending Physician Name and Address:  Royal Sill, MD  Relative Name and Phone Number:  Jago, Carton, Emergency Contact  [phone] [facility])    Current Level of Care: Hospital Recommended Level of Care: Skilled Nursing Facility Prior Approval Number:    Date Approved/Denied:   PASRR Number: [id number]  Discharge Plan: SNF    Current Diagnoses: Patient Active Problem List   Diagnosis Date Noted   Preoperative cardiovascular examination 11/29/2024   Pacemaker 11/29/2024   Coronary artery disease involving native coronary artery of native heart without angina pectoris 11/29/2024   Sepsis due to undetermined organism (HCC) 11/27/2024   Transaminitis 11/27/2024   AMS (altered mental status) 09/07/2024   Counseling regarding advance care planning and goals of care    Acute cystitis without hematuria    Heart block 07/04/2022   Heart block AV complete (HCC) 07/03/2022   Mixed diabetic hyperlipidemia associated with type 2 diabetes mellitus (HCC) 07/03/2022   GERD without esophagitis 07/03/2022   Second degree AV block, Mobitz type I 05/24/2020   Sepsis secondary to UTI (HCC) 10/12/2019   AKI (acute kidney injury) 10/12/2019   PAF (paroxysmal atrial fibrillation) (HCC) 10/12/2019   Acute metabolic encephalopathy 10/12/2019   PAC (premature atrial contraction)    Blocked premature atrial contraction    Acute urinary retention 09/14/2019   Protein-calorie malnutrition, severe 04/28/2014   Acute blood loss anemia 04/28/2014   Septic joint (HCC) 04/25/2014   DVT of lower extremity, bilateral (HCC) 04/01/2014   Bacterial  infection of knee joint (HCC) 03/29/2014   Essential hypertension 03/01/2014   Chest pain 03/01/2014   Type 2 diabetes mellitus without complication, without long-term current use of insulin  (HCC) 03/01/2014   Hyperlipidemia 03/01/2014    Orientation RESPIRATION BLADDER Height & Weight     Self  Normal Incontinent, External catheter Weight: 68 kg Height:  5' 8 (172.7 cm)  BEHAVIORAL SYMPTOMS/MOOD NEUROLOGICAL BOWEL NUTRITION STATUS      Incontinent Diet (Heart diet)  AMBULATORY STATUS COMMUNICATION OF NEEDS Skin   Limited Assist Verbally Normal                       Personal Care Assistance Level of Assistance  Bathing, Feeding, Dressing Bathing Assistance: Limited assistance Feeding assistance: Limited assistance Dressing Assistance: Limited assistance     Functional Limitations Info  Sight, Hearing, Speech Sight Info: Impaired (Prosthetic eye) Hearing Info: Adequate Speech Info: Adequate    SPECIAL CARE FACTORS FREQUENCY  PT (By licensed PT), OT (By licensed OT)     PT Frequency: 5x/wk OT Frequency: 5x/wk            Contractures Contractures Info: Not present    Additional Factors Info  Code Status, Allergies, Psychotropic Code Status Info: Full Code Allergies Info: NKA Psychotropic Info: N/A         Current Medications (12/01/2024):  This is the current hospital active medication list Current Facility-Administered Medications  Medication Dose Route Frequency Provider Last Rate Last Admin   acetaminophen  (TYLENOL ) tablet 650 mg  650 mg Oral Q6H PRN Celinda Alm Lot, MD       Or  acetaminophen  (TYLENOL ) suppository 650 mg  650 mg Rectal Q6H PRN Celinda Alm Lot, MD       ceFAZolin  (ANCEF ) IVPB 2g/100 mL premix  2 g Intravenous Q8H Patel, Pranav M, MD 200 mL/hr at 12/01/24 0525 2 g at 12/01/24 0525   ondansetron  (ZOFRAN ) tablet 4 mg  4 mg Oral Q6H PRN Celinda Alm Lot, MD       Or   ondansetron  (ZOFRAN ) injection 4 mg  4 mg Intravenous Q6H  PRN Celinda Alm Lot, MD       oxyCODONE  (Oxy IR/ROXICODONE ) immediate release tablet 5 mg  5 mg Oral Q4H PRN Patel, Pranav M, MD   5 mg at 11/30/24 2108   polyethylene glycol (MIRALAX  / GLYCOLAX ) packet 17 g  17 g Oral Daily Patel, Pranav M, MD   17 g at 12/01/24 1118   senna-docusate (Senokot-S) tablet 2 tablet  2 tablet Oral BID Patel, Pranav M, MD   2 tablet at 12/01/24 1118   simethicone  (MYLICON) chewable tablet 80 mg  80 mg Oral QID Patel, Pranav M, MD   80 mg at 12/01/24 1118     Discharge Medications: Please see discharge summary for a list of discharge medications.  Relevant Imaging Results:  Relevant Lab Results:   Additional Information SSN: [id number]  Alfonse JONELLE Rex, RN     "

## 2024-12-01 NOTE — Plan of Care (Signed)
   Problem: Coping: Goal: Level of anxiety will decrease Outcome: Progressing   Problem: Pain Managment: Goal: General experience of comfort will improve and/or be controlled Outcome: Progressing   Problem: Safety: Goal: Ability to remain free from injury will improve Outcome: Progressing

## 2024-12-01 NOTE — Plan of Care (Signed)
  Problem: Education: Goal: Knowledge of General Education information will improve Description: Including pain rating scale, medication(s)/side effects and non-pharmacologic comfort measures Outcome: Not Progressing   Problem: Health Behavior/Discharge Planning: Goal: Ability to manage health-related needs will improve Outcome: Not Progressing   Problem: Coping: Goal: Level of anxiety will decrease Outcome: Not Progressing   

## 2024-12-01 NOTE — TOC Progression Note (Signed)
 Transition of Care [facility]) - Progression Note    Patient Details  Name: [name] MRN: [id number] Date of Birth: [birth year]  [facility]) CM/SW Contact  Alfonse JONELLE Rex, RN Phone Number: 12/01/2024, 1:49 PM  Clinical Narrative:  Me with patient's spouse at bedside to review PT recommendation for short term rehab/SNF, spouse agreeable no preference, FL2 updated, faxed out for bed offers.       Expected Discharge Plan: Skilled Nursing Facility Barriers to Discharge: Continued Medical Work up               Expected Discharge Plan and Services       Living arrangements for the past 2 months: Single Family Home                                       Social Drivers of Health (SDOH) Interventions SDOH Screenings   Food Insecurity: No Food Insecurity (10/12/2024)  Housing: Low Risk (10/12/2024)  Transportation Needs: No Transportation Needs (10/12/2024)  Utilities: Not At Risk (10/12/2024)  Depression (PHQ2-9): Low Risk (10/12/2024)  Financial Resource Strain: Low Risk (10/12/2024)  Social Connections: Unknown (11/27/2024)  Stress: No Stress Concern Present (10/12/2024)  Tobacco Use: Medium Risk (11/27/2024)    Readmission Risk Interventions    11/30/2024    2:45 PM 11/29/2024    3:57 PM 07/07/2022    1:58 PM  Readmission Risk Prevention Plan  Post Dischage Appt Complete Complete Complete  Medication Screening Complete Complete Complete  Transportation Screening Complete Complete Complete

## 2024-12-02 DIAGNOSIS — A419 Sepsis, unspecified organism: Secondary | ICD-10-CM | POA: Diagnosis not present

## 2024-12-02 LAB — COMPREHENSIVE METABOLIC PANEL WITH GFR
ALT: 31 U/L (ref 0–44)
AST: 35 U/L (ref 15–41)
Albumin: 3.2 g/dL — ABNORMAL LOW (ref 3.5–5.0)
Alkaline Phosphatase: 149 U/L — ABNORMAL HIGH (ref 38–126)
Anion gap: 8 (ref 5–15)
BUN: 11 mg/dL (ref 8–23)
CO2: 24 mmol/L (ref 22–32)
Calcium: 8.9 mg/dL (ref 8.9–10.3)
Chloride: 106 mmol/L (ref 98–111)
Creatinine, Ser: 1.03 mg/dL (ref 0.61–1.24)
GFR, Estimated: >60 mL/min
Glucose, Bld: 112 mg/dL — ABNORMAL HIGH (ref 70–99)
Potassium: 4.3 mmol/L (ref 3.5–5.1)
Sodium: 138 mmol/L (ref 135–145)
Total Bilirubin: 0.3 mg/dL (ref 0.0–1.2)
Total Protein: 6.7 g/dL (ref 6.5–8.1)

## 2024-12-02 LAB — CBC WITH DIFFERENTIAL/PLATELET
Abs Immature Granulocytes: 0.02 K/uL (ref 0.00–0.07)
Basophils Absolute: 0 K/uL (ref 0.0–0.1)
Basophils Relative: 0 %
Eosinophils Absolute: 0.2 K/uL (ref 0.0–0.5)
Eosinophils Relative: 3 %
HCT: 36 % — ABNORMAL LOW (ref 39.0–52.0)
Hemoglobin: 11.8 g/dL — ABNORMAL LOW (ref 13.0–17.0)
Immature Granulocytes: 0 %
Lymphocytes Relative: 28 %
Lymphs Abs: 2.1 K/uL (ref 0.7–4.0)
MCH: 29.2 pg (ref 26.0–34.0)
MCHC: 32.8 g/dL (ref 30.0–36.0)
MCV: 89.1 fL (ref 80.0–100.0)
Monocytes Absolute: 1.1 K/uL — ABNORMAL HIGH (ref 0.1–1.0)
Monocytes Relative: 15 %
Neutro Abs: 3.8 K/uL (ref 1.7–7.7)
Neutrophils Relative %: 54 %
Platelets: 283 K/uL (ref 150–400)
RBC: 4.04 MIL/uL — ABNORMAL LOW (ref 4.22–5.81)
RDW: 13.8 % (ref 11.5–15.5)
WBC: 7.3 K/uL (ref 4.0–10.5)
nRBC: 0 % (ref 0.0–0.2)

## 2024-12-02 LAB — CULTURE, BLOOD (ROUTINE X 2): Culture: NO GROWTH

## 2024-12-02 LAB — MAGNESIUM: Magnesium: 2.1 mg/dL (ref 1.7–2.4)

## 2024-12-02 MED ORDER — CEFADROXIL 500 MG PO CAPS
1000.0000 mg | ORAL_CAPSULE | Freq: Two times a day (BID) | ORAL | Status: DC
  Administered 2024-12-02 – 2024-12-03 (×3): 1000 mg via ORAL
  Filled 2024-12-02 (×3): qty 2

## 2024-12-02 MED ORDER — SORBITOL 70 % SOLN
30.0000 mL | Freq: Two times a day (BID) | Status: AC
  Administered 2024-12-02: 30 mL via ORAL
  Filled 2024-12-02 (×2): qty 30

## 2024-12-02 NOTE — Plan of Care (Signed)
  Problem: Clinical Measurements: Goal: Signs and symptoms of infection will decrease Outcome: Progressing   Problem: Nutrition: Goal: Adequate nutrition will be maintained Outcome: Progressing   Problem: Elimination: Goal: Will not experience complications related to bowel motility Outcome: Progressing

## 2024-12-02 NOTE — TOC Progression Note (Addendum)
 Transition of Care [facility]) - Progression Note    Patient Details  Name: [name] MRN: [id number] Date of Birth: [birth year]  Transition of Care [facility]) CM/SW Contact  Alfonse JONELLE Rex, RN Phone Number: 12/02/2024, 1:26 PM  Clinical Narrative:   Met with spouse at bedside to review SNF bed offers [facility], [facility]), spouse not pleased with either facility based on [employer]. CM has reached out to [facility], [facility] and [facility] requesting review for bed offer, await response.   -1:45pm Attending met with spouse at bedside, if not additional SNF bed offers received, spouse prefers to take patient home.     Expected Discharge Plan: Skilled Nursing Facility Barriers to Discharge: Continued Medical Work up               Expected Discharge Plan and Services       Living arrangements for the past 2 months: Single Family Home                                       Social Drivers of Health (SDOH) Interventions SDOH Screenings   Food Insecurity: No Food Insecurity (10/12/2024)  Housing: Low Risk (10/12/2024)  Transportation Needs: No Transportation Needs (10/12/2024)  Utilities: Not At Risk (10/12/2024)  Depression (PHQ2-9): Low Risk (10/12/2024)  Financial Resource Strain: Low Risk (10/12/2024)  Social Connections: Unknown (11/27/2024)  Stress: No Stress Concern Present (10/12/2024)  Tobacco Use: Medium Risk (11/27/2024)    Readmission Risk Interventions    11/30/2024    2:45 PM 11/29/2024    3:57 PM 07/07/2022    1:58 PM  Readmission Risk Prevention Plan  Post Dischage Appt Complete Complete Complete  Medication Screening Complete Complete Complete  Transportation Screening Complete Complete Complete

## 2024-12-02 NOTE — Progress Notes (Signed)
 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 additionally  Recent admission 10/20 through 10 25 2025 with heart block as well as possible UTI  1/11 brought to the hospital because of abdominal pain CT scan showed chronic urinary bladder wall thickening gallbladder showed gallstones no cholecystitis US  abdomen showed gallstones without cholecystitis CBD 4 mm--- AST ALT 667/322 with bilirubin 1.3 on leukocytosis 11.4 with Tmax 102 1/12 HIDA scan negative for cholecystitis with patent cystic and common bile duct--- cardiology consulted for preop eval and signed off-General Surgery consulted and did not feel this was acute cholecystitis may have been biliary colic-no need for surgery and was advanced on diet   Assessment  & Plan :    E. coli bacteremia Source likely urinary infection although diagnosis of cholecystitis initially entertained-Ancef  Q8-culture is pansensitive-antibiotics adjust to cefadroxil  1000 twice daily EOT 1/20 No overt white count fever or chills seems to have improved overall  Gastric distention with severe constipation No stool despite MiraLAX  as well as Senokot twice daily so changing to sorbitol  30 twice daily Bowel movements last seem to have occurred on 1/13  Paroxysmal A-fib, complete heart block with pacemaker placed 08/2024 Epistaxis with has bled score 3 CHADVASC of 4 No rate control agents currently, given heart block-monitor trends but seems to be relatively rate  controlled overall on exam Outpatient follow-up  Previous bilateral DVTs in 2015 Not a candidate for anticoagulation given epistaxis  Previous well-controlled DM TY 2 with A1c 5.7 Does not require sliding scale stop checking  AKI on admission superimposed on CKD 3 AA creatinine on admit 1.4 Monitor trends only  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 Orientable to place person only-Will need skilled  Data Reviewed today: Sodium 138 potassium 4.3 CO2 24 BUN/creatinine 11/1.0 WBC 7.3 hemoglobin 11.8 platelet 283   DVT prophylaxis: SCD   Dispo/Global plan: Wife updated in person on 1/15    Subjective:   Looks fair no distress no stool since the 13th-this was confirmed with nurse tech No chest pain no fever no nausea no vomiting Wife in room and aware of options for discharge and contemplating home with home health   Objective + exam Vitals:   12/01/24 0532 12/01/24 1518 12/01/24 1953 12/02/24 0529  BP: 129/68 118/80 118/65 128/63  Pulse: (!) 59 78 72 68  Resp: 19 18 15 16   Temp: 97.7 F (36.5 C) (!) 97.4 F (36.3 C) 98 F (36.7 C) 98 F (36.7 C)  TempSrc: Oral Oral Oral Oral  SpO2: 100% 100% 100% 100%  Weight:      Height:       Filed Weights   11/27/24 0152  Weight: 68 kg     Examination:   Coherent x 1 awake alert no distress CTAB no added sound no wheeze rales ROM intact Power 5/5 to lower extremity Abdomen distended   Scheduled Meds:  cefadroxil   1,000 mg Oral BID   simethicone   80 mg Oral QID   sorbitol   30 mL Oral BID   Continuous Infusions:   acetaminophen  **OR** acetaminophen , ondansetron  **OR** ondansetron  (ZOFRAN ) IV, oxyCODONE   Time 60  Colen Grimes, MD  [facility]

## 2024-12-03 ENCOUNTER — Other Ambulatory Visit (HOSPITAL_COMMUNITY): Payer: Self-pay

## 2024-12-03 DIAGNOSIS — A419 Sepsis, unspecified organism: Secondary | ICD-10-CM | POA: Diagnosis not present

## 2024-12-03 MED ORDER — SORBITOL 70 % SOLN
30.0000 mL | Freq: Two times a day (BID) | 0 refills | Status: AC
  Filled 2024-12-03: qty 473, 8d supply, fill #0

## 2024-12-03 MED ORDER — CEFADROXIL 500 MG PO CAPS
1000.0000 mg | ORAL_CAPSULE | Freq: Two times a day (BID) | ORAL | 0 refills | Status: AC
  Filled 2024-12-03: qty 16, 4d supply, fill #0

## 2024-12-03 NOTE — Discharge Summary (Signed)
 Physician Discharge Summary  [name] FMW:[id number] DOB: 01-11-1941 DOA: 11/27/2024  PCP: Cleotilde Planas, MD  Admit date: 11/27/2024 Discharge date: 12/03/2024  Time spent: 36 minutes  Recommendations for Outpatient Follow-up:  Needs Chem-12 CBC outpatient May benefit from outpatient discussion goals of care  Discharge Diagnoses:  MAIN problem for hospitalization   E. 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 additionally   Recent admission 10/20 through 10 25 2025 with heart block as well as possible UTI   1/11 brought to the hospital because of abdominal pain CT scan showed chronic urinary bladder wall thickening gallbladder showed gallstones no cholecystitis US  abdomen showed gallstones without cholecystitis CBD 4 mm--- AST ALT 667/322 with bilirubin 1.3 on leukocytosis 11.4 with Tmax 102 1/12 HIDA scan negative for cholecystitis with patent cystic and common bile duct--- cardiology consulted for preop eval and signed off-General Surgery consulted and did not feel this was acute cholecystitis may have been biliary colic-no need for surgery and was advanced on diet    Assessment  & Plan :      E. 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 Epistaxis with has bled score 3 CHADVASC of 4 No rate control agents currently, given heart block-was relatively rate controlled during hospital stay Outpatient follow-up   Previous bilateral DVTs in 2015 Not a candidate for anticoagulation given epistaxis   Previous well-controlled DM TY 2 with A1c 5.7 Does not require sliding scale stop checking as an outpatient   AKI on admission superimposed on CKD 3 AA creatinine on admit 1.4 Monitor trends only-would get labs in about 1 week   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 Orientable to place person only-patients of skilled facility were given to wife/family patient and family elected to go home with max home therapy so home health PT aide and OT were ordered for the patient Is at relatively high risk for readmission does need outpatient follow-up    Discharge Exam: Vitals:   12/02/24 2217 12/03/24 0639  BP: 122/68 132/75  Pulse: 68 63  Resp: 16 17  Temp: 98.1 F (36.7 C) 98.2 F (36.8 C)  SpO2: 100% 100%    Subj on day of d/c   A little bit sleepy this morning-had 2 stools between yesterday and today No fever no chills Overall seems stable from yesterday  General Exam on discharge  EOMI NCAT  no focal deficit no icterus no pallor no wheeze no rales no rhonchi ROM intact no focal neurological deficit but is a little confused at times Abdomen is distended no rebound but is less distended than prior No lower extremity  edema S1-S2 no murmur no rub no gallop  Discharge Instructions   Discharge Instructions     Discharge instructions   Complete by: As directed    Finish all of the antibiotics the Duricef to complete the course of therapy for the infection in the bladder you had significant constipation which resolved well with use of sorbitol  I would use it once to twice a day for the next several days and then stop if you continue to have loose bowel movements Please note that several medications have changed you been taken off of magnesium  Remeron  and Seroquel  and probably do not need high doses of this-I would continue your Aricept  and other medications going forward.  Dementia and we will order home health come out to help you and your wife manage   Increase activity slowly   Complete by: As directed       Allergies as of 12/03/2024   No Known Allergies      Medication List     STOP taking these medications    magnesium  oxide 400 (240 Mg) MG tablet Commonly known as: MAG-OX   mirtazapine  15 MG tablet Commonly known as: REMERON    QUEtiapine  25 MG tablet Commonly known as: SEROQUEL        TAKE these medications    aspirin  EC 81 MG tablet Take 1 tablet (81 mg total) by mouth daily. Swallow whole.   cefadroxil  500 MG capsule Commonly known as: DURICEF Take 2 capsules (1,000 mg total) by mouth 2 (two) times daily for 4 days.   cyanocobalamin  1000 MCG tablet Commonly known as: VITAMIN B12 Take 1,000 mcg by mouth daily.   donepezil  5 MG tablet Commonly known as: ARICEPT  Take 5 mg by mouth at bedtime.   omeprazole 20 MG capsule Commonly known as: PRILOSEC Take 20 mg by mouth 2 (two) times daily before a meal.   sorbitol  70 % Soln Take 30 mLs by mouth 2 (two) times daily.       Allergies[1]    The results of significant diagnostics from this hospitalization (including imaging, microbiology, ancillary and laboratory) are listed below for reference.    Significant  Diagnostic Studies: NM Hepatobiliary Liver Func Result Date: 11/29/2024 EXAM: NM HEPATOBILLARY SCAN 11/29/2024 11:26:21 AM TECHNIQUE: RADIOPHARMACEUTICAL: 5.5 mCi Tc-72m mebrofenin  Dynamic images of the abdomen and pelvis were obtained in the anterior projection for 1 hour after intravenous administration of radiopharmaceutical. Morphine  was administered intravenously and imaging continued for at least 30 minutes. COMPARISON: CT and ultrasound 11/27/2024. CLINICAL HISTORY: Cholecystitis. FINDINGS: Homogenous uptake within the liver. Normal clearance of the blood pool. Appropriate excretion into the biliary system. Activity is visualized in the small bowel. No activity is visualized in the gallbladder at 45 minutes. Following intravenous administration of 3 mg morphine , the gallbladder fills. No evidence of acute cholecystitis. IMPRESSION: 1. No evidence of acute cholecystitis. 2. Patent cystic duct and common bile duct. Electronically signed by: Norleen Boxer MD MD 11/29/2024 11:39 AM EST RP Workstation: [id number]   ECHOCARDIOGRAM COMPLETE Result Date: 11/28/2024    ECHOCARDIOGRAM REPORT   Patient Name:   [name] Date of Exam: 11/28/2024 Medical Rec #:  [id number]        Height:       68.0 in Accession #:  [id number]       Weight:       150.0 lb Date of Birth:  January 05, 1941         BSA:          1.809 m Patient Age:    83 years         BP:           103/58 mmHg Patient Gender: M                HR:           65 bpm. Exam Location:  Inpatient Procedure: 2D Echo (Both Spectral and Color Flow Doppler were utilized during            procedure). Indications:    pre-operative evaluation  History:        Patient has prior history of Echocardiogram examinations, most                 recent 07/02/2022. Pacemaker, Arrythmias:Paroxysmal atrial                 fibrillation, Signs/Symptoms:Altered Mental Status; Risk                 Factors:Hypertension, Diabetes and Dyslipidemia.  Sonographer:    Tinnie Barefoot RDCS  Referring Phys: [id number] [facility] M PATEL  Sonographer Comments: Image acquisition challenging due to uncooperative patient. IMPRESSIONS  1. Left ventricular ejection fraction, by estimation, is 55 to 60%. The left ventricle has normal function. The left ventricle has no regional wall motion abnormalities. Left ventricular diastolic parameters are consistent with Grade II diastolic dysfunction (pseudonormalization). Elevated left atrial pressure. The E/e' is 18.  2. Right ventricular systolic function is normal. The right ventricular size is normal. There is mildly elevated pulmonary artery systolic pressure. The estimated right ventricular systolic pressure is 39.2 mmHg.  3. The mitral valve is grossly normal. Trivial mitral valve regurgitation. No evidence of mitral stenosis.  4. The aortic valve is grossly normal. Aortic valve regurgitation is mild. Aortic valve sclerosis is present, with no evidence of aortic valve stenosis.  5. The inferior vena cava is normal in size with greater than 50% respiratory variability, suggesting right atrial pressure of 3 mmHg. Comparison(s): A prior study was performed on 07/02/2022. LVEF 60-65%, normal diastolic dysfunction, moderate LAE, estimated RAP . FINDINGS  Left Ventricle: Left ventricular ejection fraction, by estimation, is 55 to 60%. The left ventricle has normal function. The left ventricle has no regional wall motion abnormalities. The left ventricular internal cavity size was small. There is no left ventricular hypertrophy. Left ventricular diastolic parameters are consistent with Grade II diastolic dysfunction (pseudonormalization). Elevated left atrial pressure. The E/e' is 61. Right Ventricle: The right ventricular size is normal. No increase in right ventricular wall thickness. Right ventricular systolic function is normal. There is mildly elevated pulmonary artery systolic pressure. The tricuspid regurgitant velocity is 3.01  m/s, and with an assumed right  atrial pressure of 3 mmHg, the estimated right ventricular systolic pressure is 39.2 mmHg. Left Atrium: Left atrial size was normal in size. Right Atrium: Right atrial size was normal in size. Pericardium: Trivial pericardial effusion is present. Mitral Valve: The mitral valve is grossly normal. Trivial mitral valve regurgitation. No evidence of mitral valve stenosis. Tricuspid Valve: The tricuspid valve is normal in structure. Tricuspid valve regurgitation is mild . No evidence of tricuspid stenosis. Aortic Valve: The aortic valve is grossly normal. Aortic valve regurgitation is mild. Aortic valve sclerosis is present,  with no evidence of aortic valve stenosis. Pulmonic Valve: The pulmonic valve was not well visualized. Pulmonic valve regurgitation is not visualized. No evidence of pulmonic stenosis. Aorta: The aortic root is normal in size and structure. Venous: The inferior vena cava is normal in size with greater than 50% respiratory variability, suggesting right atrial pressure of 3 mmHg. IAS/Shunts: The atrial septum is grossly normal.  LEFT VENTRICLE PLAX 2D LVIDd:         3.90 cm     Diastology LVIDs:         2.40 cm     LV e' medial:    6.02 cm/s LV PW:         0.90 cm     LV E/e' medial:  18.8 LV IVS:        0.90 cm     LV e' lateral:   6.67 cm/s LVOT diam:     1.60 cm     LV E/e' lateral: 17.0 LV SV:         39 LV SV Index:   21 LVOT Area:     2.01 cm LV IVRT:       113 msec  LV Volumes (MOD) LV vol d, MOD A2C: 50.3 ml LV vol d, MOD A4C: 54.3 ml LV vol s, MOD A2C: 18.7 ml LV vol s, MOD A4C: 26.1 ml LV SV MOD A2C:     31.6 ml LV SV MOD A4C:     54.3 ml LV SV MOD BP:      31.7 ml RIGHT VENTRICLE             IVC RV Basal diam:  2.60 cm     IVC diam: 1.70 cm RV S prime:     12.00 cm/s TAPSE (M-mode): 2.4 cm LEFT ATRIUM             Index        RIGHT ATRIUM           Index LA diam:        2.60 cm 1.44 cm/m   RA Area:     14.60 cm LA Vol (A2C):   40.6 ml 22.45 ml/m  RA Volume:   36.90 ml  20.40 ml/m LA Vol  (A4C):   36.6 ml 20.24 ml/m LA Biplane Vol: 41.9 ml 23.17 ml/m  AORTIC VALVE LVOT Vmax:   93.30 cm/s LVOT Vmean:  60.200 cm/s LVOT VTI:    0.192 m  AORTA Ao Root diam: 3.50 cm MITRAL VALVE                TRICUSPID VALVE MV Area (PHT): 2.80 cm     TV Peak grad:   44.4 mmHg MV Decel Time: 271 msec     TV Vmax:        3.33 m/s MV E velocity: 113.00 cm/s  TR Peak grad:   36.2 mmHg MV A velocity: 127.00 cm/s  TR Vmax:        301.00 cm/s MV E/A ratio:  0.89                             SHUNTS                             Systemic VTI:  0.19 m  Systemic Diam: 1.60 cm Sunit Tolia Electronically signed by Madonna Large Signature Date/Time: 11/28/2024/3:34:37 PM    Final    DG Abd Portable 1V Result Date: 11/28/2024 EXAM: 1 VIEW XRAY OF THE ABDOMEN 11/28/2024 08:41:00 AM COMPARISON: None available. CLINICAL HISTORY: Gastric outlet obstruction FINDINGS: BOWEL: Nonobstructive bowel gas pattern. There are several air filled non dilated large and small bowel loops. SOFT TISSUES: Loop recorder over left heart. No abnormal calcifications. BONES: Left total hip arthroplasty partially visualized. Moderate thoracolumbar spondylosis. No acute fracture. IMPRESSION: 1. No acute findings. Electronically signed by: Toribio Agreste MD MD 11/28/2024 12:29 PM EST RP Workstation: [id number]   CT CHEST ABDOMEN PELVIS W CONTRAST Result Date: 11/27/2024 EXAM: CT CHEST, ABDOMEN AND PELVIS WITH CONTRAST 11/27/2024 04:47:46 AM TECHNIQUE: CT of the chest, abdomen and pelvis was performed with the administration of 100 mL of iohexol  (OMNIPAQUE ) 300 MG/ML solution. Multiplanar reformatted images are provided for review. Automated exposure control, iterative reconstruction, and/or weight based adjustment of the mA/kV was utilized to reduce the radiation dose to as low as reasonably achievable. COMPARISON: CTA chest 05/11/2014, CT abdomen and pelvis 04/12/2022, right upper quadrant ultrasound today reported separately.  CLINICAL HISTORY: 84 year old male. Altered mental status, possible sepsis. 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 Superimposed extensive coronary artery calcified atherosclerosis in the chest. Mild to moderate thoracic aortic atherosclerosis. Heart size remains normal. There is an implantable cardiac device, possibly an internal ICD (series 2 image 40), new from prior exams. No pericardial effusion. No mediastinal, hilar or axillary lymphadenopathy. 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 LUNGS AND PLEURA: Mildly low lung volumes, decreased from previous exams. Symmetric mild dependent atelectasis. Trace retained secretions in the dependent trachea on series 4 image 90 but otherwise major airways are patent. No focal consolidation or pulmonary edema. No pleural effusion. No pneumothorax. ABDOMEN AND PELVIS: LIVER: Liver enhancement within normal limits. GALLBLADDER AND BILE DUCTS: Gallstones better demonstrated by ultrasound today. No convincing pericholecystic inflammation, gallbladder is nondilated. No biliary ductal dilatation. SPLEEN: No acute abnormality. PANCREAS: No acute abnormality. ADRENAL GLANDS: No acute abnormality. KIDNEYS, URETERS AND BLADDER: No stones in the kidneys or ureters. No hydronephrosis. No perinephric or periureteral stranding. Symmetric early renal contrast excretion on delayed images appears normal. Chronic urinary bladder wall thickening and prostatomegaly do not appear significantly changed from 2023; no acute perivesical inflammatory stranding is identified. GI AND BOWEL: The stomach is moderately distended with gas and fluid. The pylorus and duodenum are decompressed. No active gastroduodenal inflammation is identified. No other evidence of bowel obstruction. Mild to moderate large bowel retained stool. Normal gas containing appendix on series 2  image 86. Intermittent mild motion artifact affecting some small and large bowel detail in the abdomen and pelvis. No bowel inflammation identified elsewhere. Nasogastric tube decompression may be valuable. REPRODUCTIVE ORGANS: No acute abnormality. PERITONEUM AND RETROPERITONEUM: No ascites. No free air. VASCULATURE: Aorta is normal in caliber. Moderate to severe distal abdominal aortic and bilateral iliac artery calcified atherosclerosis. Major arterial structures in the abdomen and pelvis are patent. Major portal venous structures appear patent on the delayed images. ABDOMINAL AND PELVIS LYMPH NODES: No lymphadenopathy. REPRODUCTIVE ORGANS: Chronic prostatomegaly. No acute abnormality. BONES AND SOFT TISSUES: Widespread thoracic spinal hyperostosis with flowing endplate osteophytes resulting in interbody ankylosis at some levels (probably T4 through T9). Chronically advanced lumbar facet arthropathy with bulky facet hypertrophy and chronic vacuum facet. Chronic left hip  arthroplasty. Chronic asymmetric right SI joint degeneration. Streak artifact in the deep pelvis from hip arthroplasty. No acute osseous abnormality. No focal soft tissue abnormality. IMPRESSION: 1. Moderately distended stomach with gas and fluid. No gastroduodenal inflammation and no other bowel obstruction. Nasogastric decompression may be valuable. 2. No other acute or inflammatory process identified in the chest, abdomen, or pelvis. 3. Advanced calcified atherosclerosis. Mild atelectasis. Electronically signed by: Helayne Hurst MD MD 11/27/2024 05:33 AM EST RP Workstation: [id number]   US  Abdomen Limited RUQ (LIVER/GB) Result Date: 11/27/2024 EXAM: Right Upper Quadrant Abdominal Ultrasound 11/27/2024 04:11:23 AM TECHNIQUE: Real-time ultrasonography of the right upper quadrant of the abdomen was performed. COMPARISON: CT abdomen and pelvis 04/12/2022. CLINICAL HISTORY: 84 year old male. Biliary obstruction (hepatocellular carcinoma), altered  mental status. FINDINGS: LIVER: Echogenic liver (image 9). Hepatic steatosis. Hepatopetal flow in the portal vein. No intrahepatic biliary ductal dilatation. No evidence of mass. BILIARY SYSTEM: Gallbladder wall-echo-shadow sign (WES sign) indicating a gallbladder contracted around numerous stones. No pericholecystic fluid or wall thickening. Common bile duct measures 4 mm and is within normal limits. No sonographic Beverley sign is elicited. RIGHT KIDNEY: Right kidney is partially visible, negative. OTHER: No right upper quadrant ascites. IMPRESSION: 1. Gallbladder contracted around numerous stones, (WES sign). Wall measures 4 mm, but no strong sonographic evidence of acute cholecystitis. 2. No evidence of bile duct obstruction. 3. Hepatic steatosis. Electronically signed by: Helayne Hurst MD MD 11/27/2024 05:22 AM EST RP Workstation: [id number]   DG Chest Port 1 View if patient is in a treatment room. Result Date: 11/27/2024 EXAM: 1 VIEW(S) XRAY OF THE CHEST 11/27/2024 02:07:00 AM COMPARISON: 09/11/2024 CLINICAL HISTORY: Suspected Sepsis FINDINGS: LINES, TUBES AND DEVICES: Cardiac loop recorder device stable in place. LUNGS AND PLEURA: No focal pulmonary opacity. No pleural effusion. No pneumothorax. HEART AND MEDIASTINUM: No acute abnormality of the cardiac and mediastinal silhouettes. BONES AND SOFT TISSUES: Thoracic spondylosis and bilateral glenohumeral osteoarthritis. IMPRESSION: 1. No acute cardiopulmonary abnormality. Electronically signed by: Morgane Naveau MD MD 11/27/2024 02:10 AM EST RP Workstation: [id number]    Microbiology: Recent Results (from the past 240 hours)  Culture, blood (Routine x 2)     Status: Abnormal   Collection Time: 11/27/24  2:29 AM   Specimen: BLOOD LEFT FOREARM  Result Value Ref Range Status   Specimen Description   Final    BLOOD LEFT FOREARM Performed at [facility], [address], [place], [place] [address]    Special Requests   Final    BOTTLES DRAWN  AEROBIC AND ANAEROBIC Blood Culture results may not be optimal due to an inadequate volume of blood received in culture bottles Performed at [facility], [address] [place], [place] [address]    Culture  Setup Time   Final    GRAM NEGATIVE RODS IN BOTH AEROBIC AND ANAEROBIC BOTTLES CRITICAL RESULT CALLED TO, READ BACK BY AND VERIFIED WITH: PHARMD C. SHADE 1500 [id number] FCP Performed at [facility], [address] [place], [place] [address]    Culture ESCHERICHIA COLI (A)  Final   Report Status 11/29/2024 FINAL  Final   Organism ID, Bacteria ESCHERICHIA COLI  Final      Susceptibility   Escherichia coli - MIC*    AMPICILLIN  8 SENSITIVE Sensitive     CEFAZOLIN  (NON-URINE) <=1 SENSITIVE Sensitive     CEFEPIME  <=0.12 SENSITIVE Sensitive     ERTAPENEM <=0.12 SENSITIVE Sensitive     CEFTRIAXONE  <=0.25 SENSITIVE Sensitive     CIPROFLOXACIN <=  0.06 SENSITIVE Sensitive     GENTAMICIN  <=1 SENSITIVE Sensitive     MEROPENEM <=0.25 SENSITIVE Sensitive     TRIMETH /SULFA  <=20 SENSITIVE Sensitive     AMPICILLIN /SULBACTAM 4 SENSITIVE Sensitive     PIP/TAZO Value in next row Sensitive      <=4 SENSITIVEThis is a modified FDA-approved test that has been validated and its performance characteristics determined by the reporting laboratory.  This laboratory is certified under the Clinical Laboratory Improvement Amendments CLIA as qualified to perform high complexity clinical laboratory testing.    * ESCHERICHIA COLI  Resp panel by RT-PCR (RSV, Flu A&B, Covid) Anterior Nasal Swab     Status: None   Collection Time: 11/27/24  2:29 AM   Specimen: Anterior Nasal Swab  Result Value Ref Range Status   SARS Coronavirus 2 by RT PCR NEGATIVE NEGATIVE Final    Comment: (NOTE) SARS-CoV-2 target nucleic acids are NOT DETECTED.  The SARS-CoV-2 RNA is generally detectable in upper respiratory specimens during the acute phase of infection. The lowest concentration of SARS-CoV-2 viral  copies this assay can detect is 138 copies/mL. A negative result does not preclude SARS-Cov-2 infection and should not be used as the sole basis for treatment or other patient management decisions. A negative result may occur with  improper specimen collection/handling, submission of specimen other than nasopharyngeal swab, presence of viral mutation(s) within the areas targeted by this assay, and inadequate number of viral copies(<138 copies/mL). A negative result must be combined with clinical observations, patient history, and epidemiological information. The expected result is Negative.  Fact Sheet for Patients:  bloggercourse.com  Fact Sheet for Healthcare Providers:  seriousbroker.it  This test is no t yet approved or cleared by the [place]  FDA and  has been authorized for detection and/or diagnosis of SARS-CoV-2 by FDA under an Emergency Use Authorization (EUA). This EUA will remain  in effect (meaning this test can be used) for the duration of the COVID-19 declaration under Section 564(b)(1) of the Act, 21 U.S.C.section 360bbb-3(b)(1), unless the authorization is terminated  or revoked sooner.       Influenza A by PCR NEGATIVE NEGATIVE Final   Influenza B by PCR NEGATIVE NEGATIVE Final    Comment: (NOTE) The Xpert Xpress SARS-CoV-2/FLU/RSV plus assay is intended as an aid in the diagnosis of influenza from Nasopharyngeal swab specimens and should not be used as a sole basis for treatment. Nasal washings and aspirates are unacceptable for Xpert Xpress SARS-CoV-2/FLU/RSV testing.  Fact Sheet for Patients: bloggercourse.com  Fact Sheet for Healthcare Providers: seriousbroker.it  This test is not yet approved or cleared by the [place]  FDA and has been authorized for detection and/or diagnosis of SARS-CoV-2 by FDA under an Emergency Use Authorization (EUA). This  EUA will remain in effect (meaning this test can be used) for the duration of the COVID-19 declaration under Section 564(b)(1) of the Act, 21 U.S.C. section 360bbb-3(b)(1), unless the authorization is terminated or revoked.     Resp Syncytial Virus by PCR NEGATIVE NEGATIVE Final    Comment: (NOTE) Fact Sheet for Patients: bloggercourse.com  Fact Sheet for Healthcare Providers: seriousbroker.it  This test is not yet approved or cleared by the [place]  FDA and has been authorized for detection and/or diagnosis of SARS-CoV-2 by FDA under an Emergency Use Authorization (EUA). This EUA will remain in effect (meaning this test can be used) for the duration of the COVID-19 declaration under Section 564(b)(1) of the Act, 21 U.S.C. section 360bbb-3(b)(1), unless the authorization is  terminated or revoked.  Performed at [facility] [place], [address] [place], [place] [address]   Blood Culture ID Panel (Reflexed)     Status: Abnormal   Collection Time: 11/27/24  2:29 AM  Result Value Ref Range Status   Enterococcus faecalis NOT DETECTED NOT DETECTED Final   Enterococcus Faecium NOT DETECTED NOT DETECTED Final   Listeria monocytogenes NOT DETECTED NOT DETECTED Final   Staphylococcus species NOT DETECTED NOT DETECTED Final   Staphylococcus aureus (BCID) NOT DETECTED NOT DETECTED Final   Staphylococcus epidermidis NOT DETECTED NOT DETECTED Final   Staphylococcus lugdunensis NOT DETECTED NOT DETECTED Final   Streptococcus species NOT DETECTED NOT DETECTED Final   Streptococcus agalactiae NOT DETECTED NOT DETECTED Final   Streptococcus pneumoniae NOT DETECTED NOT DETECTED Final   Streptococcus pyogenes NOT DETECTED NOT DETECTED Final   A.calcoaceticus-baumannii NOT DETECTED NOT DETECTED Final   Bacteroides fragilis NOT DETECTED NOT DETECTED Final   Enterobacterales DETECTED (A) NOT DETECTED Final    Comment:  Enterobacterales represent a large order of gram negative bacteria, not a single organism. CRITICAL RESULT CALLED TO, READ BACK BY AND VERIFIED WITH: PHARMD C. SHADE 1500 [id number] FCP    Enterobacter cloacae complex NOT DETECTED NOT DETECTED Final   Escherichia coli DETECTED (A) NOT DETECTED Final    Comment: CRITICAL RESULT CALLED TO, READ BACK BY AND VERIFIED WITH: PHARMD C. SHADE 1500 [id number] FCP    Klebsiella aerogenes NOT DETECTED NOT DETECTED Final   Klebsiella oxytoca NOT DETECTED NOT DETECTED Final   Klebsiella pneumoniae NOT DETECTED NOT DETECTED Final   Proteus species NOT DETECTED NOT DETECTED Final   Salmonella species NOT DETECTED NOT DETECTED Final   Serratia marcescens NOT DETECTED NOT DETECTED Final   Haemophilus influenzae NOT DETECTED NOT DETECTED Final   Neisseria meningitidis NOT DETECTED NOT DETECTED Final   Pseudomonas aeruginosa NOT DETECTED NOT DETECTED Final   Stenotrophomonas maltophilia NOT DETECTED NOT DETECTED Final   Candida albicans NOT DETECTED NOT DETECTED Final   Candida auris NOT DETECTED NOT DETECTED Final   Candida glabrata NOT DETECTED NOT DETECTED Final   Candida krusei NOT DETECTED NOT DETECTED Final   Candida parapsilosis NOT DETECTED NOT DETECTED Final   Candida tropicalis NOT DETECTED NOT DETECTED Final   Cryptococcus neoformans/gattii NOT DETECTED NOT DETECTED Final   CTX-M ESBL NOT DETECTED NOT DETECTED Final   Carbapenem resistance IMP NOT DETECTED NOT DETECTED Final   Carbapenem resistance KPC NOT DETECTED NOT DETECTED Final   Carbapenem resistance NDM NOT DETECTED NOT DETECTED Final   Carbapenem resist OXA 48 LIKE NOT DETECTED NOT DETECTED Final   Carbapenem resistance VIM NOT DETECTED NOT DETECTED Final    Comment: Performed at [facility], [address], [place], [place] [address]  Culture, blood (Routine x 2)     Status: None   Collection Time: 11/27/24  3:58 AM   Specimen: BLOOD RIGHT FOREARM  Result Value Ref Range Status    Specimen Description   Final    BLOOD RIGHT FOREARM Performed at [facility], [address] [place], [place] [address]    Special Requests   Final    BOTTLES DRAWN AEROBIC AND ANAEROBIC Blood Culture results may not be optimal due to an inadequate volume of blood received in culture bottles Performed at [facility], [address] [place], [place] [address]    Culture   Final    NO GROWTH 5 DAYS Performed at [facility], [address] [place], [place] [address]    Report Status 12/02/2024 FINAL  Final  Urine Culture     Status: Abnormal   Collection Time: 11/27/24  9:19 PM   Specimen: Urine, Random  Result Value Ref Range Status   Specimen Description   Final    URINE, RANDOM Performed at [facility], [address] [place], [place] [address]    Special Requests   Final    NONE Reflexed from [id number] Performed at [facility], [address] [place], [place] [address]    Culture MULTIPLE SPECIES PRESENT, SUGGEST RECOLLECTION (A)  Final   Report Status 11/29/2024 FINAL  Final     Labs: Basic Metabolic Panel: Recent Labs  Lab 11/28/24 0330 11/29/24 0919 11/30/24 0341 12/01/24 0338 12/02/24 0348  NA 136 136 135 139 138  K 4.0 3.9 3.9 3.8 4.3  CL 106 104 104 107 106  CO2 22 24 23 25 24   GLUCOSE 88 100* 108* 108* 112*  BUN 16 16 13 13 11   CREATININE 1.11 1.05 0.95 1.06 1.03  CALCIUM 8.4* 8.2* 8.4* 8.5* 8.9  MG  --  2.1 2.0 2.0 2.1   Liver Function Tests: Recent Labs  Lab 11/28/24 0330 11/29/24 0919 11/30/24 0341 12/01/24 0338 12/02/24 0348  AST 118* 56* 46* 37 35  ALT 138* 81* 64* 50* 31  ALKPHOS 198* 167* 165* 149* 149*  BILITOT 1.2 0.4 0.3 0.3 0.3  PROT 6.1* 5.9* 6.2* 6.2* 6.7  ALBUMIN 2.9* 2.9* 2.8* 2.9* 3.2*   No results for input(s): LIPASE, AMYLASE in the last 168 hours. No results for input(s): AMMONIA in the last 168 hours. CBC: Recent Labs  Lab 11/27/24 1029  11/28/24 0330 11/29/24 0919 11/30/24 0341 12/01/24 0338 12/02/24 0348  WBC 10.8* 8.0 3.9* 5.1 5.9 7.3  NEUTROABS 8.4*  --  2.5 2.8 2.8 3.8  HGB 13.0 11.1* 11.6* 11.6* 11.1* 11.8*  HCT 39.6 33.5* 34.7* 35.1* 33.7* 36.0*  MCV 91.2 89.8 88.5 89.5 88.9 89.1  PLT 196 201 208 211 234 283   Cardiac Enzymes: No results for input(s): CKTOTAL, CKMB, CKMBINDEX, TROPONINI in the last 168 hours. BNP: BNP (last 3 results) No results for input(s): BNP in the last 8760 hours.  ProBNP (last 3 results) No results for input(s): PROBNP in the last 8760 hours.  CBG: Recent Labs  Lab 11/30/24 1158 11/30/24 1751 11/30/24 2308 12/01/24 0534 12/01/24 1151  GLUCAP 129* 105* 114* 104* 136*    Signed:  Colen Grimes MD   [facility] 12/03/2024, 9:24 AM      [1] No Known Allergies

## 2024-12-03 NOTE — TOC Transition Note (Signed)
 Transition of Care [facility]) - Discharge Note   Patient Details  Name: [name] MRN: [id number] Date of Birth: 1941/08/24  Transition of Care [facility]) CM/SW Contact:  Alfonse JONELLE Rex, RN Phone Number: 12/03/2024, 10:07 AM   Clinical Narrative:   DC to Home order, met with spouse at bedside, confirmed prefers to take patient home. MD order for [facility] PT/OT/HHA , prefers [facility], rep-[place]ory, accepted referral, added to AVS. No further CM needs identified at this time.     Final next level of care: Home w Home Health Services Barriers to Discharge: Barriers Resolved   Patient Goals and CMS Choice Patient states their goals for this hospitalization and ongoing recovery are:: return home CMS Medicare.gov Compare Post Acute Care list provided to:: Patient Represenative (must comment) (Bolds,Carolyn B  Spouse, Emergency Contact  [phone] (Mobile)) Choice offered to / list presented to : Spouse [place] ownership interest in [facility].provided to:: Spouse    Discharge Placement                       Discharge Plan and Services Additional resources added to the After Visit Summary for                            [facility] Arranged: PT, OT, Nurse's Aide HH Agency: [facility] Date [facility] Agency Contacted: 12/03/24 Time HH Agency Contacted: [phone]    Social Drivers of Health (SDOH) Interventions SDOH Screenings   Food Insecurity: No Food Insecurity (10/12/2024)  Housing: Low Risk (10/12/2024)  Transportation Needs: No Transportation Needs (10/12/2024)  Utilities: Not At Risk (10/12/2024)  Depression (PHQ2-9): Low Risk (10/12/2024)  Financial Resource Strain: Low Risk (10/12/2024)  Social Connections: Unknown (11/27/2024)  Stress: No Stress Concern Present (10/12/2024)  Tobacco Use: Medium Risk (11/27/2024)     Readmission Risk Interventions    11/30/2024    2:45 PM 11/29/2024    3:57 PM 07/07/2022    1:58 PM  Readmission Risk Prevention Plan   Post Dischage Appt Complete Complete Complete  Medication Screening Complete Complete Complete  Transportation Screening Complete Complete Complete

## 2024-12-03 NOTE — Plan of Care (Signed)
  Problem: Fluid Volume: Goal: Hemodynamic stability will improve Outcome: Progressing   Problem: Clinical Measurements: Goal: Diagnostic test results will improve Outcome: Progressing Goal: Signs and symptoms of infection will decrease Outcome: Progressing   Problem: Respiratory: Goal: Ability to maintain adequate ventilation will improve Outcome: Progressing   Problem: Education: Goal: Knowledge of General Education information will improve Description: Including pain rating scale, medication(s)/side effects and non-pharmacologic comfort measures Outcome: Progressing   Problem: Clinical Measurements: Goal: Ability to maintain clinical measurements within normal limits will improve Outcome: Progressing Goal: Will remain free from infection Outcome: Progressing Goal: Diagnostic test results will improve Outcome: Progressing Goal: Respiratory complications will improve Outcome: Progressing Goal: Cardiovascular complication will be avoided Outcome: Progressing   Problem: Activity: Goal: Risk for activity intolerance will decrease Outcome: Progressing   Problem: Nutrition: Goal: Adequate nutrition will be maintained Outcome: Progressing   Problem: Coping: Goal: Level of anxiety will decrease Outcome: Progressing   Problem: Elimination: Goal: Will not experience complications related to bowel motility Outcome: Progressing Goal: Will not experience complications related to urinary retention Outcome: Progressing   Problem: Pain Managment: Goal: General experience of comfort will improve and/or be controlled Outcome: Progressing   Problem: Safety: Goal: Ability to remain free from injury will improve Outcome: Progressing   Problem: Skin Integrity: Goal: Risk for impaired skin integrity will decrease Outcome: Progressing

## 2024-12-03 NOTE — Care Management Important Message (Signed)
 Important Message  Patient Details IM Letter given. Name: [name] MRN: [id number] Date of Birth: 18-Nov-1941   Important Message Given:  Yes - Medicare IM     Melba Ates 12/03/2024, 9:41 AM

## 2024-12-03 NOTE — Progress Notes (Signed)
 Physical Therapy Treatment Patient Details Name: [name] MRN: [id number] DOB: 1941-07-31 Today's Date: 12/03/2024   History of Present Illness 84 yo male presented to the hospital with complaints of confusion and fatigue.  Currently found to have a E. coli bacteremia likely in the setting of acute cholecystitis.  PMH of dementia, left eye blindness, T2DM, HLD, HTN, DVT and A-fib not on anticoagulation, CHB, PPM implant, failure to thrive    PT Comments  PT - Cognition Comments: AxO x .5    Requiring repeat, direct, VC's and poor, delayed responce.  Eyes shut most of session.  Alertness slightly increased with activity but not at prior level.  Spouse in room also attempting to assist Pt OOB and amb.   Pt not much responsive to her either.  Spouse stated, prior to admit Pt was self able to get OOB with a command, able to feed himself, and amb all around the house without any AD and without staggering.  Today, Pt required + 2 assist to get OOB, transfer and amb.  General bed mobility comments: Pt required Max Assist + 2 to transfer to EOB requiring repeat, simple, direct VC's and use of bed pad to complete scooting to EOB.  Severe posterior lean.  Poor self correction/awareness. General transfer comment: Heavy +2 to stand with initial posterior lean noted. no self correction.  Spouse using gait belt to pull Pt forward.  Required several attempts + 2 assist to stand.  Severe posterior lean standing with no self corrections/awareness.  Assisted with a toilet transfer was very difficult as Pt required Max Assist to sit down (rigid,impaired cognition/awareness of task) and Max Assist + 2 to stand from toilet. General Gait Details: Very limited amb distance due to severe posterior lean, shuffled steps and impaired cognition, following the simplest commands.  LPT has rec SNF.  Spouse declines her SNF bed choices due to low star rating and plans to take him home.  Spouse recruiting extra help from  family.  Has all equipment needed. Plans to D/C later today when a daughter gets out of work to help get Pt in the house.     If plan is discharge home, recommend the following: Two people to help with walking and/or transfers;Two people to help with bathing/dressing/bathroom;Help with stairs or ramp for entrance;Assist for transportation;Assistance with cooking/housework;Direct supervision/assist for medications management;Direct supervision/assist for financial management;Supervision due to cognitive status   Can travel by private vehicle     No  Equipment Recommendations  None recommended by PT    Recommendations for Other Services       Precautions / Restrictions Precautions Precautions: Fall Precaution/Restrictions Comments: Hx Dementia Restrictions Weight Bearing Restrictions Per Provider Order: No     Mobility  Bed Mobility Overal bed mobility: Needs Assistance Bed Mobility: Supine to Sit     Supine to sit: Max assist, +2 for physical assistance, +2 for safety/equipment     General bed mobility comments: Pt required Max Assist + 2 to transfer to EOB requiring repeat, simple, direct VC's and use of bed pad to complete scooting to EOB.  Severe posterior lean.  Poor self correction/awareness.    Transfers Overall transfer level: Needs assistance Equipment used: 2 person hand held assist Transfers: Sit to/from Stand Sit to Stand: Mod assist, Max assist, +2 physical assistance, +2 safety/equipment           General transfer comment: Heavy +2 to stand with initial posterior lean noted. no self correction.  Spouse using gait  belt to pull Pt forward.  Required several attempts + 2 assist to stand.  Severe posterior lean standing with no self corrections/awareness.  Assisted with a toilet transfer was very difficult as Pt required Max Assist to sit down (rigid,impaired cognition/awareness of task) and Max Assist + 2 to stand from toilet.     Ambulation/Gait Ambulation/Gait assistance: Max assist, Mod assist, +2 physical assistance, +2 safety/equipment Gait Distance (Feet): 18 Feet Assistive device: Rolling walker (2 wheels) Gait Pattern/deviations: Step-to pattern, Step-through pattern, Decreased step length - right, Decreased step length - left, Knee flexed in stance - right, Knee flexed in stance - left, Shuffle, Ataxic, Festinating, Trunk flexed Gait velocity: decreased     General Gait Details: Very limited amb distance due to severe posterior lean, shuffled steps and impaired cognition, following the simplest commands.   Stairs             Wheelchair Mobility     Tilt Bed    Modified Rankin (Stroke Patients Only)       Balance                                            Communication Communication Communication: Impaired Factors Affecting Communication: Reduced clarity of speech  Cognition Arousal: Alert Behavior During Therapy: Lability   PT - Cognitive impairments: History of cognitive impairments                       PT - Cognition Comments: AxO x .5    Requiring repeat, direct, VC's and poor, delayed responce.  Eyes shut most of session.  Alertness slightly increased with activity but not at prior level.  Spouse in room also attempting to assist Pt OOB and amb.   Pt not much responsive to her either.  Spouse stated, prior to admit Pt was self able to get OOB with a command, able to feed himself, and amb all around the house without any AD and without staggering. Following commands: Impaired Following commands impaired: Follows one step commands inconsistently    Cueing Cueing Techniques: Verbal cues, Gestural cues, Tactile cues  Exercises      General Comments        Pertinent Vitals/Pain Pain Assessment Pain Assessment: Faces Faces Pain Scale: Hurts little more Pain Location: L hand with mvmt/touch, visible edema throughout Pain Descriptors / Indicators:  Grimacing, Guarding Pain Intervention(s): Monitored during session    Home Living                          Prior Function            PT Goals (current goals can now be found in the care plan section) Progress towards PT goals: Progressing toward goals    Frequency    Min 3X/week      PT Plan      Co-evaluation              AM-PAC PT 6 Clicks Mobility   Outcome Measure  Help needed turning from your back to your side while in a flat bed without using bedrails?: A Lot Help needed moving from lying on your back to sitting on the side of a flat bed without using bedrails?: A Lot Help needed moving to and from a bed to a chair (including a wheelchair)?: A Lot Help  needed standing up from a chair using your arms (e.g., wheelchair or bedside chair)?: A Lot Help needed to walk in hospital room?: A Lot Help needed climbing 3-5 steps with a railing? : Total 6 Click Score: 11    End of Session Equipment Utilized During Treatment: Gait belt Activity Tolerance: Other (comment) (cognition) Patient left: in chair;with call bell/phone within reach;with chair alarm set;with family/visitor present Nurse Communication: Mobility status PT Visit Diagnosis: Other abnormalities of gait and mobility (R26.89)     Time: 8895-8860 PT Time Calculation (min) (ACUTE ONLY): 35 min  Charges:    $Gait Training: 8-22 mins $Therapeutic Activity: 8-22 mins PT General Charges $$ ACUTE PT VISIT: 1 Visit                     Katheryn Leap  PTA Acute  Rehabilitation Services Office M-F          [phone]

## 2024-12-03 NOTE — Progress Notes (Signed)
 Discharge meds in a secure bag delivered to patient by this RN

## 2024-12-30 ENCOUNTER — Ambulatory Visit: Admitting: Student in an Organized Health Care Education/Training Program

## 2025-01-12 ENCOUNTER — Ambulatory Visit: Admitting: Student

## 2025-01-21 ENCOUNTER — Ambulatory Visit

## 2025-01-27 ENCOUNTER — Ambulatory Visit

## 2025-04-22 ENCOUNTER — Ambulatory Visit

## 2025-04-28 ENCOUNTER — Ambulatory Visit

## 2025-07-22 ENCOUNTER — Ambulatory Visit

## 2025-07-28 ENCOUNTER — Ambulatory Visit

## 2025-10-21 ENCOUNTER — Ambulatory Visit

## 2025-10-27 ENCOUNTER — Ambulatory Visit

## 2026-01-20 ENCOUNTER — Ambulatory Visit
